# Patient Record
Sex: Male | Born: 1955 | Race: White | Hispanic: No | Marital: Married | Smoking: Never smoker
Health system: Southern US, Community
[De-identification: ages and names within clinical notes are randomized; demographics above are authoritative.]

## PROBLEM LIST (undated history)

## (undated) DIAGNOSIS — Z8673 Personal history of transient ischemic attack (TIA), and cerebral infarction without residual deficits: Secondary | ICD-10-CM

## (undated) DIAGNOSIS — I10 Essential (primary) hypertension: Secondary | ICD-10-CM

## (undated) DIAGNOSIS — E1142 Type 2 diabetes mellitus with diabetic polyneuropathy: Secondary | ICD-10-CM

## (undated) DIAGNOSIS — E1040 Type 1 diabetes mellitus with diabetic neuropathy, unspecified: Secondary | ICD-10-CM

## (undated) DIAGNOSIS — E1140 Type 2 diabetes mellitus with diabetic neuropathy, unspecified: Secondary | ICD-10-CM

## (undated) DIAGNOSIS — R0609 Other forms of dyspnea: Secondary | ICD-10-CM

## (undated) DIAGNOSIS — H532 Diplopia: Secondary | ICD-10-CM

## (undated) DIAGNOSIS — K219 Gastro-esophageal reflux disease without esophagitis: Secondary | ICD-10-CM

## (undated) DIAGNOSIS — F951 Chronic motor or vocal tic disorder: Secondary | ICD-10-CM

## (undated) DIAGNOSIS — E109 Type 1 diabetes mellitus without complications: Secondary | ICD-10-CM

## (undated) DIAGNOSIS — Z8679 Personal history of other diseases of the circulatory system: Secondary | ICD-10-CM

## (undated) DIAGNOSIS — Z860100 Personal history of colon polyps, unspecified: Secondary | ICD-10-CM

## (undated) DIAGNOSIS — G459 Transient cerebral ischemic attack, unspecified: Secondary | ICD-10-CM

## (undated) DIAGNOSIS — J449 Chronic obstructive pulmonary disease, unspecified: Secondary | ICD-10-CM

## (undated) DIAGNOSIS — G629 Polyneuropathy, unspecified: Secondary | ICD-10-CM

## (undated) DIAGNOSIS — M1712 Unilateral primary osteoarthritis, left knee: Secondary | ICD-10-CM

## (undated) DIAGNOSIS — F419 Anxiety disorder, unspecified: Secondary | ICD-10-CM

## (undated) DIAGNOSIS — I639 Cerebral infarction, unspecified: Secondary | ICD-10-CM

## (undated) DIAGNOSIS — Z87442 Personal history of urinary calculi: Secondary | ICD-10-CM

## (undated) DIAGNOSIS — E7800 Pure hypercholesterolemia, unspecified: Secondary | ICD-10-CM

## (undated) DIAGNOSIS — H409 Unspecified glaucoma: Secondary | ICD-10-CM

## (undated) DIAGNOSIS — G319 Degenerative disease of nervous system, unspecified: Secondary | ICD-10-CM

## (undated) DIAGNOSIS — F32A Depression, unspecified: Secondary | ICD-10-CM

## (undated) DIAGNOSIS — G4730 Sleep apnea, unspecified: Secondary | ICD-10-CM

## (undated) DIAGNOSIS — E785 Hyperlipidemia, unspecified: Secondary | ICD-10-CM

## (undated) DIAGNOSIS — F329 Major depressive disorder, single episode, unspecified: Secondary | ICD-10-CM

## (undated) DIAGNOSIS — I5032 Chronic diastolic (congestive) heart failure: Secondary | ICD-10-CM

## (undated) DIAGNOSIS — K200 Eosinophilic esophagitis: Secondary | ICD-10-CM

## (undated) HISTORY — PX: COLONOSCOPY: SHX174

## (undated) HISTORY — PX: CATARACT EXTRACTION, BILATERAL: SHX1313

## (undated) HISTORY — PX: WISDOM TOOTH EXTRACTION: SHX21

## (undated) HISTORY — PX: EYE SURGERY: SHX253

## (undated) HISTORY — PX: CYSTOSCOPY: SUR368

## (undated) HISTORY — PX: JOINT REPLACEMENT: SHX530

## (undated) HISTORY — PX: ESOPHAGOGASTRODUODENOSCOPY: SHX1529

## (undated) MED ORDER — GABAPENTIN 300 MG CAP
300 mg | ORAL_CAPSULE | ORAL | Status: DC
Start: ? — End: 2014-03-21

## (undated) MED ORDER — CLONIDINE 0.1 MG TAB
0.1 mg | ORAL_TABLET | Freq: Two times a day (BID) | ORAL | Status: DC
Start: ? — End: 2012-10-19

## (undated) MED ORDER — CLOPIDOGREL 75 MG TAB
75 mg | ORAL_TABLET | Freq: Every day | ORAL | Status: DC
Start: ? — End: 2013-07-05

## (undated) MED ORDER — BENZONATATE 200 MG CAP
200 mg | ORAL_CAPSULE | Freq: Three times a day (TID) | ORAL | Status: AC | PRN
Start: ? — End: 2013-07-17

## (undated) MED ORDER — INSULIN LISPRO 100 UNIT/ML INJECTION
100 unit/mL | SUBCUTANEOUS | Status: DC
Start: ? — End: 2012-06-23

## (undated) MED ORDER — CLONAZEPAM 0.5 MG TAB
0.5 mg | ORAL_TABLET | Freq: Two times a day (BID) | ORAL | Status: DC
Start: ? — End: 2013-12-04

## (undated) MED ORDER — CLONIDINE 0.1 MG TAB
0.1 mg | ORAL_TABLET | Freq: Two times a day (BID) | ORAL | Status: AC
Start: ? — End: 2013-01-17

## (undated) MED ORDER — OMEPRAZOLE 40 MG CAP, DELAYED RELEASE
40 mg | ORAL_CAPSULE | Freq: Every day | ORAL | Status: DC
Start: ? — End: 2014-01-25

## (undated) MED ORDER — OMEPRAZOLE 40 MG CAP, DELAYED RELEASE
40 mg | ORAL_CAPSULE | ORAL | Status: DC
Start: ? — End: 2013-04-02

## (undated) MED ORDER — OMEPRAZOLE 20 MG CAP, DELAYED RELEASE
20 mg | ORAL_CAPSULE | Freq: Every day | ORAL | Status: DC
Start: ? — End: 2013-01-02

## (undated) MED ORDER — LISINOPRIL 20 MG TAB
20 mg | ORAL_TABLET | Freq: Every day | ORAL | Status: DC
Start: ? — End: 2013-12-21

## (undated) MED ORDER — CLONAZEPAM 0.5 MG TAB
0.5 mg | ORAL_TABLET | Freq: Two times a day (BID) | ORAL | Status: DC
Start: ? — End: 2013-05-18

## (undated) MED ORDER — CLOPIDOGREL 75 MG TAB
75 mg | ORAL_TABLET | Freq: Every day | ORAL | Status: DC
Start: ? — End: 2014-07-18

## (undated) MED ORDER — ALBUTEROL SULFATE HFA 90 MCG/ACTUATION AEROSOL INHALER
90 mcg/actuation | RESPIRATORY_TRACT | Status: DC | PRN
Start: ? — End: 2014-03-29

## (undated) MED ORDER — ATORVASTATIN 20 MG TAB
20 mg | ORAL_TABLET | Freq: Every day | ORAL | Status: DC
Start: ? — End: 2013-09-01

## (undated) MED ORDER — INSULIN LISPRO 100 UNIT/ML INJECTION
100 unit/mL | SUBCUTANEOUS | Status: DC
Start: ? — End: 2013-02-25

## (undated) MED ORDER — AZITHROMYCIN 250 MG TAB
250 mg | ORAL_TABLET | ORAL | Status: AC
Start: ? — End: 2013-07-15

## (undated) MED ORDER — GABAPENTIN 300 MG CAP
300 mg | ORAL_CAPSULE | ORAL | Status: DC
Start: ? — End: 2013-08-27

## (undated) MED ORDER — OMEPRAZOLE 20 MG CAP, DELAYED RELEASE
20 mg | ORAL_CAPSULE | ORAL | Status: DC
Start: ? — End: 2013-07-10

## (undated) MED ORDER — OMEPRAZOLE 40 MG CAP, DELAYED RELEASE
40 mg | ORAL_CAPSULE | ORAL | Status: DC
Start: ? — End: 2013-08-03

## (undated) MED ORDER — ATORVASTATIN 20 MG TAB
20 mg | ORAL_TABLET | ORAL | Status: DC
Start: ? — End: 2014-02-25

## (undated) MED ORDER — CLONAZEPAM 0.5 MG TAB
0.5 mg | ORAL_TABLET | Freq: Two times a day (BID) | ORAL | Status: DC
Start: ? — End: 2013-03-02

## (undated) MED ORDER — HUMALOG U-100 INSULIN 100 UNIT/ML SUBCUTANEOUS SOLUTION: 100 unit/mL | SUBCUTANEOUS | Status: AC

---

## 1996-12-10 DIAGNOSIS — I639 Cerebral infarction, unspecified: Secondary | ICD-10-CM

## 1996-12-10 HISTORY — DX: Cerebral infarction, unspecified: I63.9

## 2004-10-18 HISTORY — PX: CAROTID ARTERY ANGIOPLASTY: SHX1300

## 2004-10-27 NOTE — Procedures (Signed)
[facility]                         PERIPHERAL VASCULAR LABORATORY   NAME:     Maurice [name], Maurice [name]                  DATE:   10/26/2004   AGE/DOB: 49  /  01/17/1956                      ROOM #: OP   SEX:      [name]                                MR #:    [id number]   CPT CODE: [id number]                            SS#     [id number]   REFERRING PHYSICIAN:   THOMAS L MAUSER, [name].D.   CHIEF COMPLAINT/SYMPTOMS:  Right bruit; history of TIA   EXAMINATION:   CEREBROVASCULAR EXAMINATION   INTERPRETATION:   Brachial pressures are equal and biphasic.  Duplex   evaluation of the extracranial carotid vasculature was performed.   On the   right side, there is marked plaque in the proximal internal carotid artery   with velocity elevation up to 333/136, consistent with an 80-99% stenosis.   The vertebral artery is patent with antegrade flow.  On the left side,   there is minimal plaque in the bifurcation with no hemodynamically   significant stenosis noted.  Velocities and spectral analyses are within   normal limits.   The vertebral artery is patent with antegrade flow.   IMPRESSION:      1. Right internal carotid artery 80-99% stenosis.      2. Left internal carotid artery less than 20% stenosis.      3. Patent antegrade vertebral arteries.   Electronically Signed By:   RASESH [name]. SHAH, [name].D. 10/29/2004 16:22   ______________________________________________   RASESH [name]. SHAH, [name].D.   lo  D: 10/27/2004  T: 10/27/2004  4:16 P  [id number]     jcb

## 2004-10-27 NOTE — Procedures (Signed)
[facility]                         PERIPHERAL VASCULAR LABORATORY   NAME:     Maurice [name], Maurice [name]                  DATE:   10/26/2004   AGE/DOB: 49  /  11-02-1955                      ROOM #: OP   SEX:      M                                MR #:    [id number]   CPT CODE: [id number]                            SS#     [id number]   REFERRING PHYSICIAN:   Blenda Bridegroom, M.D.   CHIEF COMPLAINT/SYMPTOMS:  Right bruit; history of TIA   EXAMINATION:   CEREBROVASCULAR EXAMINATION   INTERPRETATION:   Brachial pressures are equal and biphasic.  Duplex   evaluation of the extracranial carotid vasculature was performed.   On the   right side, there is marked plaque in the proximal internal carotid artery   with velocity elevation up to 333/136, consistent with an 80-99% stenosis.   The vertebral artery is patent with antegrade flow.  On the left side,   there is minimal plaque in the bifurcation with no hemodynamically   significant stenosis noted.  Velocities and spectral analyses are within   normal limits.   The vertebral artery is patent with antegrade flow.   IMPRESSION:      1. Right internal carotid artery 80-99% stenosis.      2. Left internal carotid artery less than 20% stenosis.      3. Patent antegrade vertebral arteries.   Electronically Signed By:   Michael Litter, M.D. 10/29/2004 16:22   ______________________________________________   Michael Litter, M.D.   lo  D: 10/27/2004  T: 10/27/2004  4:16 P  [id number]     jcb

## 2004-11-10 NOTE — H&P (Signed)
[facility]                              HISTORY AND PHYSICAL                              RASESH Rosita Fire, M.D.   NAME:    Maurice [name], Maurice [name]   MR #:    [id number]                    ADM DATE:        11/18/2004   BILLING  [id number]                   PT. LOCATION   #:   SS #     [id number]   Michael Litter, M.D.   cc:    Blenda Bridegroom, M.D.          Michael Litter, M.D.   COPY TO:   [facility] Transplant   HISTORY OF PRESENT ILLNESS:   This is an 49year-old whiteman found to have   a significant right carotid stenosis on a workup of an asymptomatic bruit.   He is brought to the Angio suite for diagnostic angiography in preparation   for probable right carotid endarterectomy.   PAST MEDICAL HISTORY:    Significant for diabetes, hypertension, and   gastroesophageal reflux disease.   PAST SURGICAL HISTORY:   None.   SOCIAL HISTORY:   He is a nonsmoker, social drinker.   ALLERGIES:    No known allergies.   MEDICATIONS:   Aspirin, Plavix, lisinopril, insulin and omeprazole.   REVIEW OF SYSTEMS:  Is negative for stroke, transient ischemic attack or   amaurosis, negative for coronary symptoms, negative for peripheral vascular   symptoms.   FAMILY HISTORY:  Positive for diabetes.   PHYSICAL EXAMINATION:   He is well-developed and in no acute distress.   HEENT:   Examination unremarkable.   NECK:   Supple with a right carotid bruit.   CHEST:   Clear.   HEART:  Regular.   ABDOMEN:    Soft, nontender with no aneurysm.   EXTREMITIES:  Lower extremity arteriogram normal.   NEUROLOGIC:  Intact.   Duplex scan reveals a right carotid stenosis, grade 3.   IMPRESSION:   Asymptomatic high grade right carotid stenosis.   PLAN:   Arch and carotid arteriography in anticipation of probable right   carotid endarterectomy.   Electronically Signed By:   Michael Litter, M.D. 11/13/2004 17:24   ____________________________   Michael Litter, M.D.   eb  D:  11/10/2004  T:  11/10/2004 10:16 P   [id number]

## 2004-11-10 NOTE — H&P (Signed)
[facility]                              HISTORY AND PHYSICAL                              RASESH Rosita Fire, M.D.   NAME:    Maurice [name], Maurice [name]   MR #:    [id number]                    ADM DATE:        11/11/2004   BILLING                              PT. LOCATION   #:   SS #     [id number]   Michael Litter, M.D.   cc:    Blenda Bridegroom, M.D.          Michael Litter, M.D.   COPY TO:   Vascular and Transplant   HISTORY OF PRESENT ILLNESS:   This is an 49 year-old white man found to   have a severe right carotid stenosis on workup of an asymptomatic bruit.   He is brought to the operating room for a right carotid endarterectomy.   PAST MEDICAL HISTORY:    Significant for diabetes, hypertension and   gastroesophageal reflux.   PAST SURGICAL HISTORY:   None.   SOCIAL HISTORY:   He is a nonsmoker and social drinker.   ALLERGIES:    No allergies.   MEDICATIONS:   Are noted.   REVIEW OF SYSTEMS:  Is negative for cerebrovascular, cardiovascular or   peripheral vascular events.   FAMILY HISTORY:   Is for diabetes.   PHYSICAL EXAMINATION:   GENERAL:  He is well-developed and in no acute distress.   HEENT:   Examination unremarkable.   NECK:   Supple with a right carotid bruit.   CHEST:   Clear.   HEART:  Regular.   ABDOMEN:   Soft, nontender, no aneurysm.   EXTREMITIES:   Lower extremity arterial examination normal.   NEUROLOGIC:  Intact.   Duplex scan has confirmed right carotid stenosis.  Angiogram report is   pending at the time of this dictation.   IMPRESSION:   High grade asymptomatic right carotid stenosis.   PLAN:   Right carotid endarterectomy.   Electronically Signed By:   Michael Litter, M.D. 11/13/2004 17:24   ____________________________   Michael Litter, M.D.   eb  D:  11/10/2004  T:  11/10/2004 10:25 P   [id number]

## 2004-11-11 NOTE — Procedures (Signed)
[facility]                      NUCLEAR CARDIOLOGY STRESS TEST REPORT   NAME:   Maurice [name], Maurice [name]                              SS#:      [id number]   DOB:     25-Mar-1956                                     AGE:      49   SEX:     M                                           ROOM#:   OP   MR#:    [id number]                                       DATE:    11/11/2004   REFERRING PHYS:   Vira Browns   PRETEST DATA:   ISOTOPE:  Thallium 201; Tc44m Sestamibi   DIPYRIDAMOLE DOSAGE:  46.4 mg (9 cc)   INDICATION:  Pre-op   MEDS TAKEN:  --   MEDS HELD:  Lisinopril, Plavix, Aspirin, Clonazepam, Insulin   RISK FACTORS: Hypertension; diabetes   BASELINE ECG:  Normal sinus rhythm; nonspecific ST-T wave changes; poor R   wave progression   EXERCISE SUPERVISED BY:  Donnie Mesa, ACNP   TEST RESULTS:                         DIPYRIDAMOLE OBSERVATION PERIOD   TIME:   REST   1 MINUTE  2 MINUTES 3 MINUTES  4 MINUTES  EXERCISE   RECOVERY   HR:      71       71        88         78         90        --         96   BP:    117/71    99/72    117/75     116/65     100/71    ---/--     110/79   REASON FOR STOPPING:  Completed protocol                   TOTAL EXERCISE   TIME:  0:00   HR RESPONSE:  Normal                                       ACHIEVED HR:   90   BP RESPONSE:  Normal                                       CHEST PAIN:   None   OBSERVED DYSRHYTHMIAS:  None   ST SEGMENT CHANGES:  Further increase in rest changes                                STRESS ECG REPORT   CONCLUSION:  Non-diagnostic ECG response due to rest ST segment changes;   Unremarkable clinical response to intravenous Pharmacologic stress.                                 NUCLEAR REPORT   FINDINGS:  (Read with Dr. Van Clines.)  Tomographic nuclear imaging   demonstrates no significant fixed or transient defects, comparing the   infusion stress study and the rest images.  Gated stress study demonstrates   0.62 ejection fraction.    OVERALL IMPRESSION:  Normal ECG gated intravenous pharmacologic SPECT dual   isotope nuclear stress test demonstrates normal left ventricular function   with no evidence of myocardial ischemia during pharmacologic stress.   ECG STRESS INTERPRETATION BY:            NUCLEAR INTERPRETATION BY:   Harland German., M.D.              Harland German., M.D.   ds  D: 11/11/2004  T: 11/12/2004  2:05 P    [id number]

## 2004-11-11 NOTE — Procedures (Signed)
[facility]                      NUCLEAR CARDIOLOGY STRESS TEST REPORT   NAME:   [name], Maurice M                              SS#:      [id number]   DOB:     11/25/1955                                     AGE:      49   SEX:     M                                           ROOM#:   OP   MR#:    [id number]                                       DATE:    11/11/2004   REFERRING PHYS:   R. Shah   PRETEST DATA:   ISOTOPE:  Thallium 201; Tc99m Sestamibi   DIPYRIDAMOLE DOSAGE:  46.4 mg (9 cc)   INDICATION:  Pre-op   MEDS TAKEN:  --   MEDS HELD:  Lisinopril, Plavix, Aspirin, Clonazepam, Insulin   RISK FACTORS: Hypertension; diabetes   BASELINE ECG:  Normal sinus rhythm; nonspecific ST-T wave changes; poor R   wave progression   EXERCISE SUPERVISED BY:  Holly Buchanan, ACNP   TEST RESULTS:                         DIPYRIDAMOLE OBSERVATION PERIOD   TIME:   REST   1 MINUTE  2 MINUTES 3 MINUTES  4 MINUTES  EXERCISE   RECOVERY   HR:      71       71        88         78         90        --         96   BP:    117/71    99/72    117/75     116/65     100/71    ---/--     110/79   REASON FOR STOPPING:  Completed protocol                   TOTAL EXERCISE   TIME:  0:00   HR RESPONSE:  Normal                                       ACHIEVED HR:   90   BP RESPONSE:  Normal                                       CHEST PAIN:   None   OBSERVED DYSRHYTHMIAS:  None   ST SEGMENT CHANGES:    Further increase in rest changes                                STRESS ECG REPORT   CONCLUSION:  Non-diagnostic ECG response due to rest ST segment changes;   Unremarkable clinical response to intravenous Pharmacologic stress.                                 NUCLEAR REPORT   FINDINGS:  (Read with Dr. Daughdrille.)  Tomographic nuclear imaging   demonstrates no significant fixed or transient defects, comparing the   infusion stress study and the rest images.  Gated stress study demonstrates   0.62 ejection fraction.    OVERALL IMPRESSION:  Normal ECG gated intravenous pharmacologic SPECT dual   isotope nuclear stress test demonstrates normal left ventricular function   with no evidence of myocardial ischemia during pharmacologic stress.   ECG STRESS INTERPRETATION BY:            NUCLEAR INTERPRETATION BY:   CHARLES C. ASHBY, JR., M.D.              CHARLES C. ASHBY, JR., M.D.   ds  D: 11/11/2004  T: 11/12/2004  2:05 P    [id number]

## 2004-11-16 NOTE — Procedures (Signed)
[facility]                                 PROCEDURE NOTE                              Maurice [name], M.D.   [facility] [name], [name]:   MR  [id number]                         DATE:            11/16/2004   #:   [name]  [id number]                      PT. LOCATION:    ERO EO12   #   Maurice [name], M.D.   cc:    Blenda Bridegroom, M.D.          Michael Litter, M.D.   Extra copies to office:  0   PREOPERATIVE DIAGNOSIS:   Right carotid stenosis.   POSTOPERATIVE DIAGNOSIS:   Same.   PROCEDURE:   1.  Arch aortogram.   2.  Selective bilateral carotid arteriograms.   SURGEON:   Dr. Sherryll Burger   ANESTHESIA:   Local   COMPLICATIONS:   None   INDICATIONS:   This is a 49 year old white man found to have a high-grade right carotid   stenosis on workup of an asymptomatic bruit. He is brought to the angio   suite for diagnostic angiography in anticipation of right carotid   endarterectomy.   DESCRIPTION OF PROCEDURE:  The patient was brought to the angio suite,   placed supine and the right groin prepped and draped in the usual sterile   fashion.  Xylocaine 1% was used as local anesthetic and conscious sedation   was administered with intravenous fentanyl and versed.  The appropriate   monitors were in place.  A  micropuncture needle used to access the right   common femoral artery.  This was upsized to a 4-French sheath.  Bentson   wire was used to advance the pigtail catheter into the ascending aorta.  A   30-degree LAO arch aortogram was performed.  The pigtail was then exchanged   for a Bernstein catheter which was used in conjunction with a soft angle   guidewire in order to select a left common carotid artery. AP and lateral   cervical and intracranial carotid arteriograms were performed.  The   catheter was then remanipulated into the right common carotid artery and   again, AP and lateral cervical and intracranial carotid arteriograms were   performed.  Once adequate pictures were  obtained, the catheter was removed   over wire, sheath was pulled and hemostasis achieved with digital pressure.   A dry sterile dressing was placed and the patient transported to recovery   in stable condition; there were no complications.   INTERPRETATION OF FILMS:   Arch aortogram revealed normal arch anatomy with separate take-offs of the   innominate, left carotid and left subclavian arteries. Both subclavian and   vertebral arteries are widely patent.  The right common carotid artery is   widely patent up to the bifurcation.  There is a 95% stenosis at the origin   of the internal carotid artery with a normal distal  internal carotid   artery.  The external carotid artery is widely patent.  The internal   carotid artery fills a middle cerebral territory.  The anterior cerebral   territory is filled from the contralateral side.  Left side common carotid   artery is widely patent to the bifurcation.  There is no significant   disease in the internal or external carotid arteries. The internal carotid   artery intracranially is normal and fills both the left middle cerebral and   the left and right anterior cerebral territories.   Electronically Signed By:   Michael Litter, M.D. 11/18/2004 16:29   _________________________________   Michael Litter, M.D.   jdm  D:  11/16/2004  T:  11/16/2004  4:31 P   [id number]

## 2004-11-16 NOTE — Procedures (Signed)
[facility]                                 PROCEDURE NOTE                              RASESH Rosita Fire, M.D.   [facility] [name], [address] [place]:   MR  [id number]                         DATE:            11/16/2004   #:   [name]  [id number]                      PT. LOCATION:    ERO EO12   #   RASESH Rosita Fire, M.D.   cc:    Blenda Bridegroom, M.D.          Michael Litter, M.D.   Extra copies to office:  0   PREOPERATIVE DIAGNOSIS:   Right carotid stenosis.   POSTOPERATIVE DIAGNOSIS:   Same.   PROCEDURE:   1.  Arch aortogram.   2.  Selective bilateral carotid arteriograms.   SURGEON:   Dr. Sherryll Burger   ANESTHESIA:   Local   COMPLICATIONS:   None   INDICATIONS:   This is a 49 year old white man found to have a high-grade right carotid   stenosis on workup of an asymptomatic bruit. He is brought to the angio   suite for diagnostic angiography in anticipation of right carotid   endarterectomy.   DESCRIPTION OF PROCEDURE:  The patient was brought to the angio suite,   placed supine and the right groin prepped and draped in the usual sterile   fashion.  Xylocaine 1% was used as local anesthetic and conscious sedation   was administered with intravenous fentanyl and versed.  The appropriate   monitors were in place.  A  micropuncture needle used to access the right   common femoral artery.  This was upsized to a 4-French sheath.  Bentson   wire was used to advance the pigtail catheter into the ascending aorta.  A   30-degree LAO arch aortogram was performed.  The pigtail was then exchanged   for a Bernstein catheter which was used in conjunction with a soft angle   guidewire in order to select a left common carotid artery. AP and lateral   cervical and intracranial carotid arteriograms were performed.  The   catheter was then remanipulated into the right common carotid artery and   again, AP and lateral cervical and intracranial carotid arteriograms were    performed.  Once adequate pictures were obtained, the catheter was removed   over wire, sheath was pulled and hemostasis achieved with digital pressure.   A dry sterile dressing was placed and the patient transported to recovery   in stable condition; there were no complications.   INTERPRETATION OF FILMS:   Arch aortogram revealed normal arch anatomy with separate take-offs of the   innominate, left carotid and left subclavian arteries. Both subclavian and   vertebral arteries are widely patent.  The right common carotid artery is   widely patent up to the bifurcation.  There is a 95% stenosis at the origin   of the internal carotid artery with a normal distal  internal carotid   artery.  The external carotid artery is widely patent.  The internal   carotid artery fills a middle cerebral territory.  The anterior cerebral   territory is filled from the contralateral side.  Left side common carotid   artery is widely patent to the bifurcation.  There is no significant   disease in the internal or external carotid arteries. The internal carotid   artery intracranially is normal and fills both the left middle cerebral and   the left and right anterior cerebral territories.   Electronically Signed By:   Michael Litter, M.D. 11/18/2004 16:29   _________________________________   Michael Litter, M.D.   jdm  D:  11/16/2004  T:  11/16/2004  4:31 P   [id number]

## 2004-11-18 NOTE — Procedures (Signed)
Maurice [name] Maurice [name]                                 PROCEDURE NOTE                              RASESH Maurice [name], M.D.   [facility] Maurice [name], [place]   E:   MR  [id number]                         DATE:            11/18/2004   #:   Maurice [name]  [id number]                      PT. LOCATION:    [id number]   #   RASESH Maurice [name], M.D.   cc:    Michael Litter, M.D.   Extra copies to office:  0   PREOPERATIVE DIAGNOSIS:   High-grade asymptomatic right carotid stenosis.   POSTOPERATIVE DIAGNOSIS:   High-grade asymptomatic right carotid stenosis.   PROCEDURE:   Right carotid endarterectomy.   SURGEON:   Chalmers Guest. Sherryll Burger, M.D.   ASSISTANTDarl Pikes, PA-C (no residents available).   ANESTHESIA:   Cervical block.   COMPLICATIONS:   None.   INDICATIONS:   This is a 49 year old white male found to have a high-grade right carotid   stenosis which is asymptomatic and workup of a bruit. This was confirmed by   angiography, and he is now brought to the operating room for right carotid   endarterectomy.   DESCRIPTION OF PROCEDURE:  The patient was brought to the operating room   and after a right cervical block was in place, he was placed supine and the   right neck prepped and draped in the usual sterile fashion.  Oblique   incision made in a skin line overlying the carotid bifurcation which was   then dissected free. The patient was given 4000 units of IV heparin and   low-molecular dextran began at 10 cc per hour IV.  Once adequate exposure   was obtained, circulating time was allowed for the heparin, the vessels   were clamped.  The patient remained neurologically intact.  Therefore,   standard endarterectomy was carried out using the Therapist, nutritional with no   shunt.  This was a very localized lesion just in the bulb of the internal   carotid artery, which cleaned out nicely.  Therefore, the arteriotomy was   simply closed with a 6-0 Prolene suture.  Before the last of the suture   bites were placed, the vessels were  first flushed with dextran and back   bled then flushed with heparin and dextran again.  The last 2 suture bites   were then placed and the suture tied, clamps released, hemostasis was   adequate.  There is a good pulse and a normal Doppler signal distally.   Therefore the wound was then closed with 3-0 Vicryl and 4-0 Monocryl.   Steri-Strips and dressings were placed and the patient transported to   recovery in hemodynamically and neurologically stable condition.  There   were no complications.   Electronically Signed By:   Michael Litter, M.D. 11/18/2004 16:30   _________________________________   Michael Litter, M.D.   Dwaine Deter  D:  11/18/2004  T:  11/18/2004  4:15 P   [id number]

## 2004-11-18 NOTE — Procedures (Signed)
[facility]                                 PROCEDURE NOTE                              RASESH Rosita Fire, M.D.   [facility] [name], [place]   E:   MR  [id number]                         DATE:            11/18/2004   #:   [name]  [id number]                      PT. LOCATION:    [id number]   #   RASESH Rosita Fire, M.D.   cc:    Michael Litter, M.D.   Extra copies to office:  0   PREOPERATIVE DIAGNOSIS:   High-grade asymptomatic right carotid stenosis.   POSTOPERATIVE DIAGNOSIS:   High-grade asymptomatic right carotid stenosis.   PROCEDURE:   Right carotid endarterectomy.   SURGEON:   Chalmers Guest. Sherryll Burger, M.D.   ASSISTANTDarl Pikes, PA-C (no residents available).   ANESTHESIA:   Cervical block.   COMPLICATIONS:   None.   INDICATIONS:   This is a 49 year old white male found to have a high-grade right carotid   stenosis which is asymptomatic and workup of a bruit. This was confirmed by   angiography, and he is now brought to the operating room for right carotid   endarterectomy.   DESCRIPTION OF PROCEDURE:  The patient was brought to the operating room   and after a right cervical block was in place, he was placed supine and the   right neck prepped and draped in the usual sterile fashion.  Oblique   incision made in a skin line overlying the carotid bifurcation which was   then dissected free. The patient was given 4000 units of IV heparin and   low-molecular dextran began at 10 cc per hour IV.  Once adequate exposure   was obtained, circulating time was allowed for the heparin, the vessels   were clamped.  The patient remained neurologically intact.  Therefore,   standard endarterectomy was carried out using the Therapist, nutritional with no   shunt.  This was a very localized lesion just in the bulb of the internal   carotid artery, which cleaned out nicely.  Therefore, the arteriotomy was   simply closed with a 6-0 Prolene suture.  Before the last of the suture    bites were placed, the vessels were first flushed with dextran and back   bled then flushed with heparin and dextran again.  The last 2 suture bites   were then placed and the suture tied, clamps released, hemostasis was   adequate.  There is a good pulse and a normal Doppler signal distally.   Therefore the wound was then closed with 3-0 Vicryl and 4-0 Monocryl.   Steri-Strips and dressings were placed and the patient transported to   recovery in hemodynamically and neurologically stable condition.  There   were no complications.   Electronically Signed By:   Michael Litter, M.D. 11/18/2004 16:30   _________________________________   Michael Litter, M.D.   Dwaine Deter  D:  11/18/2004  T:  11/18/2004  4:15 P   [id number]

## 2004-11-19 NOTE — Discharge Summary (Signed)
[facility]                                DISCHARGE SUMMARY   NAM AKAASH, VANDEWATER:   MR  [id number]                          ADM DATE:      11/18/2004   #:   [name]  [id number]                       DIS DATE:      11/19/2004   #   Michael Litter, M.D.   cc:    Michael Litter, M.D.   PREOPERATIVE DIAGNOSIS:  Right carotid artery stenosis.   POSTOPERATIVE DIAGNOSIS:  Right carotid artery stenosis.   OPERATION:  Right carotid endarterectomy with primary closure.   BRIEF HISTORY &amp; PHYSICAL:  Mr. Ganas is a 49 year old Caucasian male who   was found to have a high-grade right carotid artery stenosis.  Following   angiography surgery was recommended.  The patient had been on aspirin and   Plavix at home.  He was a nonsmoker.   FAMILY HISTORY:   Positive for diabetes.   REVIEW OF SYSTEMS:  Negative.   PHYSICAL EXAMINATION:  A right carotid bruit.   HOSPITAL COURSE:  The patient was brought via same day admit, prepared with   preoperative Ancef.  He had a cervical block performed and he was taken to   the operating room where he underwent the previously mentioned surgery   without complications.  During the operative procedure and during the   immediate postoperative observation period in the ICU he remained   hemodynamically stable.  There was no neurologic sequelae.  On the day of   discharge the patient was feeling well with mild incisional pain.  His   neurologic status was intact, his vital signs were stable.  His lab tests   were unremarkable.  After breakfast and ambulation in the unit, the patient   was discharged home.  He was advised to continue on his preoperative   medications until seen by Dr. Sherryll Burger, who is going to see him in 2 weeks in   the office.  He was advised to have no heavy exertional   activity for a week.  The patient understood these orders and was   discharged in stable condition with follow up planned as dictated.   Electronically Signed By:    Lillia Dallas. PANNETON 11/26/2004 08:36   ________________________________   Michael Litter, M.D.   Dictated by: Mellody Dance, PA-C   mh  D:  11/19/2004  T:  11/19/2004  7:51 A   [id number]

## 2006-09-13 NOTE — ED Provider Notes (Signed)
[facility]                      EMERGENCY DEPARTMENT TREATMENT REPORT   NAME:  Maurice [name], Maurice [name]                        PT. LOCATION:     ER  [id number]   MR #:         BILLING #: [id number]          DOS: 09/13/2006   TIME:11:22 P   57-16-48   cc:    Blenda Bridegroom, M.D.   Primary Physician:   Caralyn Guile, M.D.   TIME SEEN:   2243.   CHIEF COMPLAINT:  Fall.   HISTORY OF PRESENT ILLNESS:  A 51 year old male presents with his spouse by   EMS after a fall in the shower this evening.  He states that he slipped on   some soapy water and fell striking his iliac crest on a built-in shower   bench.  He denies any head injury or loss of consciousness.  He denies any   pain anywhere else.   REVIEW OF SYSTEMS   EYES:  No visual changes.   ENT:  No epistaxis or loose teeth.   RESPIRATORY:   No trouble breathing.   CARDIOVASCULAR:  No rib pain.   GASTROINTESTINAL:   No vomiting or abdominal pain.   MUSCULOSKELETAL:   Right posterior back pain and iliac crest pain, no   midline pain.   NEUROLOGICAL:  No head injury or loss of consciousness.   SKIN:  No abrasions or lacerations.   PAST MEDICAL HISTORY:  Includes diabetes, hypertension, carotid artery   surgery, anxiety.   SOCIAL HISTORY: Here with spouse.   FAMILY HISTORY:  Noncontributory.   ALLERGIES:  No known drug allergies.   MEDICATIONS:  Reviewed by myself in Ibex.   PHYSICAL EXAMINATION:   VITAL SIGNS:  Blood pressure 139/89, pulse 81, respirations 16, temperature   97.8, oxygen saturation 100% on room air, pain 7/10.   GENERAL APPEARANCE:  The patient appears well developed and well nourished.   Appearance and behavior are age and situation appropriate.   RESPIRATORY:  Clear and equal breath sounds.  No respiratory distress,   tachypnea, or accessory muscle use.   CARDIOVASCULAR:  Regular rate and rhythm.   GI:  Abdomen soft, nontender, without complaint of pain to palpation.  No   hepatomegaly or splenomegaly.    MUSCULOSKELETAL:   Right hip - nontender over the anterior, posterior and   lateral aspect, nontender over the femur, knee, tibia, fibula and foot with   dorsalis pedis pulse intact.  He is tender over the right iliac crest   posteriorly.   SKIN:  Warm and dry without rashes.   PSYCHIATRIC:   Recent and remote memory appear to be intact.   NEUROLOGICAL:  No focal deficits.   CONTINUATION BY Salem Caster, PA-C:   INITIAL ASSESSMENT AND MANAGEMENT PLAN:  [age]-year-old male presents after   a fall with back pain.  We are going to check a pelvis to rule out   fracture.  We will also give him Dilaudid and Zofran IV.   DIAGNOSTIC STUDIES:  Pelvis and right hip lateral read by Dr. Raynald Kemp as   negative.   DIAGNOSES:      1. Acute right paralumbar back pain.      2. Fall.   PLAN:  1. The patient is discharged home in stable condition, with instructions      to follow up with their regular doctor.  They are advised to return      immediately for any worsening or symptoms of concern.      2. Prescription written for Percocet #16 and Robaxin #40.  May also take      over-the-counter Motrin.  Written a work note for 2 days.   Electronically Signed By:   Jerilynn Som, M.D. 09/15/2006 05:37   ____________________________   Jerilynn Som, M.D.   My signature above authenticates this document and my orders, the final   diagnosis(es), discharge prescription(s) and instructions in the [facility] record.   rew/gm   D:  09/13/2006  T:  09/13/2006 11:48 P   [id number]   Salem Caster, PA-C

## 2008-09-05 NOTE — ED Provider Notes (Signed)
[facility]                      EMERGENCY DEPARTMENT TREATMENT REPORT   NAME:  Maurice [name], Maurice [name]                      PT. LOCATION:     ER  ER01   MR #:         BILLING #: [id number]          DOS: 09/04/2008   TIME: 1:44 A   57-16-48   cc:    Blenda Bridegroom, M.D.   PRIMARY CARE PHYSICIAN   Caralyn Guile, M.D.   ER EVALUATION TIME ON 09/04/08   23:15.   CHIEF COMPLAINT   Sent from [facility].   HISTORY OF THE PRESENT ILLNESS   A 53 year old male comes in for evaluation   of upper abdominal pain that started around 4:30 this afternoon. He said   that it is mid epigastric, nonradiating, sharp in nature, nothing like he   has ever experienced before.  He ate something because his blood sugar was   low.  The pain did not increase; however, his blood sugar remained in the   50s and 60s. He went to [facility], disconnected his insulin pump, and   it came up to about 110.  He has had several episodes of vomiting.  No   fever.  Denies any other complaint or concern.   REVIEW OF SYSTEMS   CONSTITUTIONAL:  Has not had a fever or chill.   EYES:  No visual symptoms.   ENT:  No sore throat, runny nose or other URI symptoms.   RESPIRATORY:  No cough, shortness of breath, or wheezing.   CARDIOVASCULAR:  No chest pain, chest pressure, or palpitations.   GASTROINTESTINAL:  See HPI.   MUSCULOSKELETAL:  No joint pain or swelling.   NEUROLOGICAL:  No headaches, sensory or motor symptoms.   PAST MEDICAL HISTORY   He is a diabetic.  He has also had high cholesterol.   SOCIAL HISTORY   Here with his wife.   MEDICATIONS   Listed and reviewed in [place]ex.   ALLERGIES   None.   PHYSICAL EXAMINATION   GENERAL APPEARANCE:  The patient appears well developed and well nourished.   Appearance and behavior are age and situation appropriate.   VITAL SIGNS:  Blood pressure 161/106, pulse 113, respirations 22,   temperature 98.4, 96% on room air, 9/10 pain.    Eyes:  Conjunctivae clear, lids normal.  Pupils equal, symmetrical, and   normally reactive.   Mouth/Throat:  Surfaces of the pharynx, palate, and tongue are pink, moist,   and without lesions.   RESPIRATORY:  Clear and equal breath sounds.  No respiratory distress,   tachypnea, or accessory muscle use.   CARDIOVASCULAR:  Heart regular, without murmurs, gallops, rubs, or thrills.   PMI not displaced.   GI:  Abdomen is distended, tender in the epigastrium.  No rebound,   guarding.  No splenomegaly or hepatomegaly.  Bowel sounds are present.   NEURO:  Awake, alert, oriented without deficit focally.   CONTINUATION BY ERIN ICENBICE, PA-C   INITIAL ASSESSMENT   A patient with mid epigastric pain, sent to [facility].  At this time,   he has had an episode of vomiting, feeling distended.  We are going to   obtain basic blood work including a lipase and CMP.  Will  look at the   patient's abdominal films and reassess.   ED COURSE   The patient received 8 of Zofran and 1 mg of Dilaudid. White count  was   17.3 with 83 segs,  hemoglobin and hematocrit 17.3 and 50.  CMP showed a   glucose of 152, otherwise unremarkable.  The patient's lipase is 65.  Plain   films of the abdominal series reviewed from [facility]  showed   questionable ileus.  With the patient's tenderness and these findings on   x-ray, a CAT scan of the abdomen and pelvis was ordered, read by [facility]   [facility] as no acute process by Dr. Risa Grill.  Dr. Jama Flavors spoke   with  Dr. Emmit Alexanders, as on reassessment, the patient was still having some   discomfort, tenderness in the mid epigastrium with his elevating white   count which was only 14 at [facility].  He will be placed in the   hospital for observation for Dr. Emmit Alexanders to see in the morning and further   assess.   CLINICAL IMPRESSION AND DIAGNOSES   1. Abdominal pain, etiology unclear.   2. Leukocytosis.   DISPOSITION Admission to Dr. Emmit Alexanders.   Electronically Signed By:    Imogene Burn, M.D. 09/06/2008 13:06   ____________________________   Imogene Burn, M.D.   Dictated by Oswaldo Conroy ICENBICE   My signature above authenticates this document and my orders, the final   diagnosis(es), discharge prescription(s) and instructions in the [facility] record.   PB  D:  09/05/2008  T:  09/05/2008  3:09 A   [id number]

## 2008-09-05 NOTE — H&P (Signed)
[facility]                              HISTORY AND PHYSICAL                              Blenda Bridegroom, M.D.   NAMETYSHUN, TUCKERMAN   MR #:    [id number]                  ADM DATE:          09/04/2008   BILLING  [id number]                 PT. LOCATION       ER  [id number]   #:   SS #     [id number]               DOB:  07-17-1956   AGE:  53   Blenda Bridegroom, M.D.              SEX:  M   cc:    Larry Sierras, M.D.          Blenda Bridegroom, M.D.   ADMISSION DIAGNOSIS   Abdominal pain.   HISTORY OF PRESENT ILLNESS   The patient is very pleasant, 53 year old gentleman with a greater than   25-year history of insulin-requiring diabetes who was in his usual state of   health until about 4:30 yesterday afternoon.  The patient noted that he   felt like he had a low blood sugar and, in fact, it was about 57 on his   reading late afternoon.  He took a couple of cookies to bring his sugars   up.  Before having dinner and the thereafter, he developed a crampy   abdominal pain primarily in the epigastrium, but also in the right upper   quadrant.  This had a constant nature to it, but also a crampy nature.  He   was not overly nauseated, but subsequently vomited multiple times.  The   patient, prior to vomiting, felt lightheaded and became diaphoretic.   The patient initially went to [facility], but subsequently was referred   over to the emergency room.  He started vomiting at [facility] and   vomited several times there and one time here in the emergency room.  The   pain remained fairly constant in location.   The patient denies any blood in the vomitus.  He has not had any recent   illnesses including any diarrheal illnesses.  He has not noticed any   constipation or blood in his stool.  He is free of urinary symptoms.   In the ER the patient had initially a normal temperature of 98.4, but at   this morning his temperature is up to 100.  His initial white blood cell    count is elevated at 17,300 with 83% segs.  A repeat blood count early this   morning has normalized back to 8400.  Lipase level is normal at 65.  The   patient's liver enzymes are within normal limits.  His BUN and creatinine   are 17 and 1.1 respectively.  Electrolytes are within normal limits.  The   patient had a CT scan of his abdomen and pelvis performed here in the ER   which showed a small  amount of free fluid in the right lower quadrant   surrounding several small bowel loops.  A low density was also seen at the   ampule of the pancreas.  The patient also had fat-containing inguinal   hernias.  The remainder of the CT scan was normal.  The patient received   pain medicine, IV Dilaudid and IV Zofran, and currently rates his pain as a   3/10.  Yesterday evening he rated it as a 9/10.  The patient is going to be   admitted to a general medical floor for further evaluation of his abdominal   pain.  The differential diagnoses are fairly broad with gallbladder disease   being fairly high on the list of possibilities.  Additional possibilities   include gastroparesis given his diabetes, peptic ulcer disease and possible   gastric outlet stricture.  The pancreatic abnormality needs further   evaluation.  Tentative plan is to obtain an ultrasound of the right upper   quadrant and further evaluation will be pursued based on the results of   that study.   PAST MEDICAL HISTORY   1. Insulin-requiring diabetes for greater than 25 years.  Dr. Ovidio Hanger is      his endocrinologist.  The patient manages his diabetes with an insulin      pump.  He has complications of retinopathy in (Dr. Eustace Quail),      peripheral neuropathy which is mild and microalbuminuria.   2. Hypertension.   3. Hyperlipidemia   4. No known history of coronary artery disease.  The patient had a nuclear      stress test in March 2006 which was negative for reversible ischemia or      evidence of scar.    5. History of adult-onset measles in the past which required ICU admission.   6. The patient was admitted once with pneumonia which also required an ICU      admission.   7. History of a TIA with transient left hemiparesis.  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   8. Peripheral vascular disease status post right carotid endarterectomy.   9. Colonoscopy from February 2009 with removal of a 3-mm left-sided polyp,      otherwise normal exam.   10. History of Peyronie's disease.   CURRENT MEDICATIONS   1. Lisinopril 10 mg daily.   2. Omeprazole 20 mg daily.   3. Klonopin 0.5 mg 1 p.o. b.i.d.   4. Insulin pump.   5. Enteric-coated aspirin 165 mg p.o. daily.   6. Alpha lipoic acid daily.   7. Vitamin E daily.   8. Vitamin D daily.   9. Clonidine 0.1 mg b.i.d.   10. Timolol ophthalmic drops 0.25%, 1 drop both eyes daily.   11. Lipitor 10 mg daily.   ALLERGIES   The patient has no known drug allergies.  He is intolerant of Zocor,   Cymbalta and avoids inhaled corticosteroids.   SOCIAL HISTORY   The patient is married.  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  He does not smoke.  He   occasionally has a beer, though this is rare.  He does not exercise   regularly.   FAMILY HISTORY   Father also had diabetes and was diagnosed with prostate cancer in his 28s.   He has a brother who died as a result of a motor-vehicle accident, but also  had diabetes.  He has one son and one daughter both of whom are healthy.   One uncle had Parkinson's disease.   REVIEW OF SYSTEMS   Please see above.  The patient denies any chest pain or pressure,   palpitations, shortness of breath, cough, wheezing or recent respiratory   illness.  The patient does complain of some mild throat discomfort after   vomiting.  He currently has a mild headache.  He denies any recent change   in his vision.  He chronically has some mild numbness in his toes and has a    nervous tic in forceful contractions of some of his toes, fairly well   controlled with low-dose Klonopin.  He denies any dysuria, frequency,   urgency or hematuria.  He denies any hesitancy of urination.   PHYSICAL EXAMINATION   The patient is a very pleasant, 53 year old gentleman.  He is awake, alert   and oriented x3, not in any acute distress.  Temperature again was 100   degrees.   HEENT:  No oral lesions were noted.   NECK:  There is no cervical adenopathy.  No carotid bruits were   auscultated.  No thyromegaly is palpable.   HEART:  Regular rate of 85 without murmur, rub or gallop.   LUNGS:  Clear to auscultation in all fields without crackles, wheezes,   rhonchi or dullness.   ABDOMEN:  Soft with mild epigastric tenderness without guarding or rebound   currently.  There is no tenderness in the right upper quadrant currently to   deep palpation, though the patient states that last night there would have   been.  No hepatomegaly or splenomegaly is palpable.  No other masses were   palpable.  No bruits were auscultated.   RECTAL:  Prostate is 2+ without nodules and is of normal consistency.   Hemoccult stool is negative.   EXTREMITIES:  There is no peripheral edema or cyanosis present.  PT pulses   are +1 bilaterally.   NEUROLOGIC:  Motor strength is +5.  There is no facial weakness.  Speech is   fluent.   ASSESSMENT AND PLAN   Abdominal pain of uncertain etiology with fairly broad differential   diagnoses.  Will, as mentioned above, start with an ultrasound of the right   upper quadrant with further decision making pending the results of this   test.  Will continue with IV fluids, p.r.n. IV antiemetics and IV   narcotics.  I have requested the patient stop his insulin pump in the   short-term and we will provide a basal level of insulin with Lantus and   give additional insulin based on Accu-Cheks.  Blood cultures will be   obtained should he spike another fever and a urine culture will be obtained    as well.   Electronically Signed By:   Blenda Bridegroom, M.D. 09/05/2008 09:59   ____________________________   Blenda Bridegroom, M.D.   [facility]  D:  09/05/2008  T:  09/05/2008 12:04 P   [id number]

## 2008-09-06 NOTE — Discharge Summary (Signed)
[facility]                                DISCHARGE SUMMARY                              Blenda Bridegroom, M.D.   NAME:  Maurice [name], Maurice [name]   MR #:  [id number]                      ADM DATE:   09/04/2008   [name] [id number]                     DIS DATE:     09/06/2008   G#   SS #   [id number]                   DOB:  Aug 18, 1955   Blenda Bridegroom, M.D.                AGE:53                   SEX:  M   cc:    Larry Sierras, M.D.          Blenda Bridegroom, M.D.   DISCHARGE DIAGNOSES   1. Severe epigastric to right upper quadrant pain of uncertain etiology      without recurrence.   2. Insulin dependent diabetes for greater than 25 years on an insulin      pump.   3. Hypertension.   4. Hyperlipidemia.   5. Status post right carotid endarterectomy.   HISTORY OF PRESENT ILLNESS   Please see H&amp;P for details without addition or deletion.   HOSPITAL COURSE   The patient was admitted from the emergency room yesterday morning.  By the   time I saw the patient in the ER yesterday morning he had minimal   tenderness in the epigastrium and right upper quadrant.  He was not   nauseated.  He was kept NPO yesterday with IV fluids and had no recurrence   of his abdominal pain or vomiting since admission.  The patient had a trial   of regular diabetic breakfast this morning without recurrence of his   symptoms.   The patient's abdominal pain was always present but had some crampy   characteristics to it.  When he first hit the ER with pain his liver   enzymes were all within normal limits.  A CT scan of his abdomen and pelvis   showed a small amount of right lower pericolic fluid and a subtle   hypodensity in the ampulla of the pancreas but otherwise was normal.  The   patient subsequently had an ultrasound of his right upper quadrant   performed which was entirely within normal limits.  His initial white blood   cell count was elevated at 17,000 but very quickly normalized yesterday    morning and is again normal this morning.  The patient has had no fever.   His abdominal exam this morning is entirely benign.   The case was discussed with the patient this morning going over multiple   potential etiologies of his abdominal pain.  With the complete resolution   and other negative findings objectively will proceed with a more   conservative approach and discharge the patient home.  He has  agreed to   contact me promptly should he have any recurrence of abdominal pain and   further workup could be arranged at that time.   The patient's blood sugars were monitored during the hospitalization.  He   was given a dose of Lantus insulin lower than his usual basal amount at 20   units of Lantus and covered with a sliding scale.  It was requested that   the patient discontinue his insulin pump while in the hospital.   A hemoglobin A1c this admission came back with very good control 7.5.  His   lipase level on admission was normal and a repeat amylase and lipase this   morning were again normal as were his liver enzymes and normal white blood   cell count.  As mentioned above a urinalysis was negative.   The patient is going to be discharged on the following medications:   1. Klonopin 0.5 mg p.o. b.i.d.   2. Catapres 0.1 mg p.o. b.i.d.   3. He is going to resume his insulin pump and is aware of keeping his basal      rate a little bit lower until the effects of the Lantus from yesterday      have worn off.   4. Zestril 10 mg p.o. daily.   5. Timolol eye drops 0.25 mg 1 drop each eye daily.   6. Omeprazole 20 mg daily.   7. He can resume his alpha lipoic acid daily.   8. Vitamin E daily.   9. Vitamin D daily.   10. Lipitor 10 mg daily.   Again the patient is aware of contacting me immediately should he have any   recurrence of his abdominal symptoms.   Electronically Signed By:   Blenda Bridegroom, M.D. 09/12/2008 12:01   ____________________________   Blenda Bridegroom, M.D.    LE  D:  09/06/2008  T:  09/06/2008 10:47 P   [id number]

## 2009-02-04 DIAGNOSIS — E1051 Type 1 diabetes mellitus with diabetic peripheral angiopathy without gangrene: Secondary | ICD-10-CM | POA: Insufficient documentation

## 2011-08-09 ENCOUNTER — Emergency Department: Payer: Self-pay | Admitting: Emergency Medicine

## 2011-08-13 NOTE — ED Provider Notes (Signed)
MEDICATION ADMINISTRATION SUMMARY              Drug Name: *Phenergan, Dose Ordered: 25 mg, Route: IM, Status:         Cancelled, Time: 19:45 08/13/2011,          Drug Name: *Dextrose 5% and 0.45% Sodium Chloride, Dose Ordered: 50         mL/hr, Route: IV Fluid, Status: Given, Time: 08:47 08/14/2011,          Drug Name: *Dextrose 50% and Water, Dose Ordered: 25 gm, Route: IV         Push, Status: Given, Time: 06:35 08/14/2011,          Drug Name: *Morphine Sulfate, Dose Ordered: 4 mg, Route: IV Push,         Status: Held, Time: 06:22 08/14/2011,          Drug Name: Morphine Sulfate, Dose Ordered: 4 mg, Route: IV Push,         Status: Given, Time: 05:15 08/14/2011,          Drug Name: Zofran, Dose Ordered: 4 mg, Route: IV Push, Status: Given,         Time: 05:15 08/14/2011,          Drug Name: *Sodium Chloride 0.9%, Intravenous, Dose Ordered: 150         mL/hr, Route: IV Fluid, Status: Given, Time: 01:27 08/14/2011,          Drug Name: Morphine Sulfate, Dose Ordered: 2-4 mg, Route: IV Push,         Status: Given, Time: 00:03 08/14/2011,          Drug Name: Dilaudid, Dose Ordered: 1 mg, Route: IV Push, Status:         Given, Time: 20:27 08/13/2011,          Drug Name: *Zofran, Dose Ordered: 4 mg, Route: IV Push, Status:         Given, Time: 20:26 08/13/2011,          Drug Name: *Sodium Chloride 0.9%, Intravenous, Dose Ordered: 150         mL/hr, Route: IV Fluid, Status: Given, Time: 20:23 08/13/2011,         *Additional information available in notes, Detailed record available         in Medication Service section.       KNOWN ALLERGIES   NKDA       OBS PROGRESS NOTE   OBS PROGRESS NOTE: OBS Progress Notes. pt resting comfortably,         cont pain mgmt. urlogy to see in AM. CT (+) 5.35mm obstructing stone.         (Sat Aug 14, 2011 03:14 JAR0)     OBS Progress Notes. Pt pain currently controlled but RN staff requesting         PRN pain medication for pt. Morphine prn q 6h ordered, pending          urology consult. (Sat Aug 14, 2011 06:15 NJB)     OBS Progress Notes. Saw pt at 9am. He is comfortable and awaiting urology         consult. (Sat Aug 14, 2011 11:24 JLW)       TRIAGE (Fri Aug 13, 2011 17:00 TJM0)   TRIAGE NOTES:  Dx 4 mm kidney stone R kidney Monday at [facility], . PT DROVE SELF TO ED TODAY FROM [place]. (  Fri         Aug 13, 2011 17:00 TJM0)   PATIENT: NAME: Escareno, Feliberto Gottron, AGE: 55, GENDER: male, DOB: Sun         1955-08-30, TIME OF GREET: Fri Aug 13, 2011 16:53, SSN: [id number],         HEIGHT: 172cm, MEDICAL RECORD NUMBER: [phone], ACCOUNT NUMBER:         [id number], PCP: Blenda Bridegroom,. (Fri Aug 13, 2011 17:00 TJM0)     KG WEIGHT: 79.8. (20:46 DMS1)   ADMISSION: URGENCY: 3, TRANSPORT: Ambulatory, DEPT: Emergency,         BED: WAITING. (Fri Aug 13, 2011 17:00 TJM0)   VITAL SIGNS: BP 172/92, (Sitting), Pulse 102, Resp 18, Temp 98.9,         (Oral), Pain 10, O2 Sat 100, on Room air, Time 08/13/2011 16:53.         (16:53 TJM0)   COMPLAINT:  RLQ pain. (Fri Aug 13, 2011 17:00 TJM0)   PRESENTING COMPLAINT:  RLQ pain, d/x kidney stone Monday, 4 mm,         Since 5 days ago. (17:42 WAB1)   PAIN: Patient complains of pain, Pain described as radiating, On         a scale 0-10 patient rates pain as 10, RLQ, Pain is constant, No         modifying factors, No efforts tried to eliminate symptoms. (17:42         WAB1)   TREATMENT PRIOR TO ARRIVAL: None. (17:42 WAB1)   TB SCREENING: Unable to assess for TB. (17:42 WAB1)   ABUSE SCREENING: Patient denies physical abuse or threats. (17:42         WAB1)   FALL RISK: Fall risk assessment not applicable to this patient.         (17:42 WAB1)   SUICIDAL IDEATION: Suicidal ideation is not present. (17:42         WAB1)   ADVANCE DIRECTIVES: Patient does not have advance directives.         (17:42 WAB1)   PROVIDERS: TRIAGE NURSE: Theodis Sato, RN. (Fri Aug 13, 2011         17:00 TJM0)    PREVIOUS VISIT ALLERGIES: Nkda. Caleen Essex Aug 13, 2011 17:00         TJM0)       PRESENTING PROBLEM (Fri Aug 13, 2011 17:00 TJM0)      Presenting problems: Abdominal Pain, Flank Pain.       CURRENT MEDICATIONS   Aspirin:  162 mg Oral once a day. (17:01 TJM0)   Klonopin:  0.5 mg Oral once a day (at bedtime). (17:01 TJM0)   Humalog:  Subcutaneous See Notes. insulin pump - titrate         according to glucose levels &amp; carb intake. (17:01 TJM0)   Lisinopril:  20 mg Oral once a day. (17:01 TJM0)   Lipitor:  20 mg Oral once a day. (17:01 TJM0)   Clonidine Hydrochloride:  0.1 mg Oral 2 times a day. (17:02         TJM0)   Timolol Maleate:  1 gtt Eye once a day. to bilat eyes in AM.         (17:02 TJM0)   Omeprazole:  40 mg Oral once a day. in PM. (17:02 TJM0)   Gabapentin:  300 mg Oral once a day (at bedtime). (17:03         TJM0)   Zantac:  300 mg Oral once a day (at bedtime). (17:03 TJM0)   Percocet 10/325:  1 tab Oral As needed every 6 hours. prn kidney         pain. (17:04 TJM0)   Flomax:  0.4 mg Oral once a day. (17:04 TJM0)   Zofran:  4 mg Oral As needed every 6 hours. prn n/v. (17:04         TJM0)       MEDICATION SERVICE   Dextrose 5% and 0.45% Sodium Chloride:  Order: Dextrose 5% and         0.45% Sodium Chloride (Dextrose/Sodium Chloride) - Dose: 50         mL/hr : IV Fluid         Notes: D5 and 1/2 Normal Saline         *Reminder* Enter reason for fluid below         Ordered by: Hilaria Ota, PA-C         Entered by: Hilaria Ota, PA-C Sat Aug 14, 2011 07:02          Documented as given by: Dara Hoyer ZOX Aug 14, 2011 08:47          Patient, Medication, Dose, Route and Time verified prior to         administration.          Amount given: 50 cc, Connections checked prior to administration,         Line traced prior to administration, Catheter placement confirmed via         flush prior to administration, IV site without signs or symptoms of          infiltration during medication administration, No swelling during         administration, No drainage during administration, IV flushed after         administration, Correct patient, time, route, dose and medication         confirmed prior to administration, Patient advised of actions and         side-effects prior to administration, Allergies confirmed and         medications reviewed prior to administration, Patient in position of         comfort, Side rails up, Cart in lowest position, Call light in reach.    : Follow Up : Response assessment performed, No signs or         symptoms of allergic reaction noted, Increased urine output, Advised         not to ambulate without assistance, Patient in position of comfort,         Side rails up, Cart in lowest position. (Sat Aug 14, 2011 10:50         MNS1)    : Follow Up : Response assessment performed, No signs or         symptoms of allergic reaction noted, _IV SITE #1:_, IV fluid infusion         discontinued, on Sat Aug 14, 2011 11:00, Total fluid hydration time         IV site 1 2 hours, 15 minutes, ., Patient in position of comfort,         Side rails up, Cart in lowest position. (Sat Aug 14, 2011 14:04         MNS1)   Dextrose 50% and Water:  Order: Dextrose 50% and Water (Dextrose)         - Dose: 25  gm : IV Push         Notes: Give 1 amp D50 now, Telephone Order         Ordered by: Hilaria Ota, PA-C         Entered by: Robin Searing, RN Sat Aug 14, 2011 06:40          Documented as given by: Robin Searing, RN Sat Aug 14, 2011 06:35          Patient, Medication, Dose, Route and Time verified prior to         administration.          Amount given: 1 amp, IV SITE #1 into left antecubital, IV SITE #1         IVP, initial medication, Rapidly, Connections checked prior to         administration, Line traced prior to administration, Catheter         placement confirmed via flush prior to administration, IV site          without signs or symptoms of infiltration during medication         administration, No swelling during administration, No drainage during         administration, IV flushed after administration, Correct patient,         time, route, dose and medication confirmed prior to administration,         Patient advised of actions and side-effects prior to administration,         Allergies confirmed and medications reviewed prior to administration,         Patient tolerated procedure well, Administered by mla, rn, Patient in         position of comfort, Side rails up, Cart in lowest position, Call         light in reach,          Co-signed by: Hilaria Ota, PA-C Sat Aug 14, 2011 07:01.   Dilaudid:  Order: Dilaudid (Hydromorphone Hydrochloride) -         Dose: 1 mg : IV Push         Single Dose Exceeded - Rationale: Patient tolerated similiar in past         Ordered by: Candis Shine, M.D.         Entered by: Candis Shine, M.D. Fri Aug 13, 2011 19:45 ,          Acknowledged by: Reggie Pile, RN Fri Aug 13, 2011 19:48         Documented as given by: Reggie Pile, RN Fri Aug 13, 2011 20:27          Patient, Medication, Dose, Route and Time verified prior to         administration.          Amount given: 1 mg, IV SITE #1 into right antecubital, IV SITE #1         IVP, initial medication, Slowly, Connections checked prior to         administration, Line traced prior to administration, Catheter         placement confirmed via flush prior to administration, IV site         without signs or symptoms of infiltration during medication         administration, No swelling during administration, No drainage during         administration, IV flushed after administration, Correct patient,         time, route,  dose and medication confirmed prior to administration,         Patient advised of actions and side-effects prior to administration,         Allergies confirmed and medications reviewed prior to administration,          Patient tolerated procedure well, Patient in position of comfort,         Side rails up, Cart in lowest position, Family at bedside, Call light         in reach.   Morphine Sulfate:  Order: Morphine Sulfate - Dose: 2-4         mg : IV Push         Ordered by: Candis Shine, M.D.         Entered by: Candis Shine, M.D. Fri Aug 13, 2011 19:50 ,          Acknowledged by: Reggie Pile, RN Fri Aug 13, 2011 20:15         Documented as given by: Robin Searing, RN Sat Aug 14, 2011 00:03          Patient, Medication, Dose, Route and Time verified prior to         administration.          Amount given: 4mg , IV SITE #1 into left antecubital, IV SITE #1 IVP,         initial medication, Slowly, subsequent different medication, Slowly,         Connections checked prior to administration, Line traced prior to         administration, Catheter placement confirmed via flush prior to         administration, IV site without signs or symptoms of infiltration         during medication administration, No swelling during administration,         No drainage during administration, IV flushed after administration,         Correct patient, time, route, dose and medication confirmed prior to         administration, Patient advised of actions and side-effects prior to         administration, Allergies confirmed and medications reviewed prior to         administration, Patient tolerated procedure well, Administered by         mla, rn, Patient in position of comfort, Side rails up, Cart in         lowest position, Call light in reach.   Morphine Sulfate:  Order: Morphine Sulfate - Dose: 4 mg         : IV Push         Ordered by: April Manson, PA-C         Entered by: April Manson, PA-C Sat Aug 14, 2011 05:02          Documented as given by: Robin Searing, RN Sat Aug 14, 2011 05:15          Patient, Medication, Dose, Route and Time verified prior to         administration.           Amount given: 4mg , IV SITE #1 into left antecubital, IV SITE #1 IVP,         initial medication, Slowly, subsequent different medication, Slowly,         Connections checked prior to administration, Line traced prior to         administration, Catheter placement confirmed via flush prior to  administration, IV site without signs or symptoms of infiltration         during medication administration, No swelling during administration,         No drainage during administration, IV flushed after administration,         Correct patient, time, route, dose and medication confirmed prior to         administration, Patient advised of actions and side-effects prior to         administration, Allergies confirmed and medications reviewed prior to         administration, Patient tolerated procedure well, Administered by         mla, rn, Patient in position of comfort, Side rails up, Cart in         lowest position, Call light in reach.   Morphine Sulfate:  Order: Morphine Sulfate - Dose: 4 mg         : IV Push         Notes: q 6h prn pain relief; hold if bp &lt; 100/60         Ordered by: Henderson Valparaiso, PA-C         Entered by: Henderson Shoreham, PA-C Sat Aug 14, 2011 06:14 ,          Held by: Robin Searing, RN Sat Aug 14, 2011 06:22 Reason: Pain         controlled at present.   Sodium Chloride 0.9%, Intravenous:  Order: Sodium Chloride 0.9%,         Intravenous (Sodium Chloride) - Dose: 150 mL/hr : IV Fluid         Notes: *Reminder* Enter reason for fluid below         Ordered by: Candis Shine, M.D.         Entered by: Candis Shine, M.D. Fri Aug 13, 2011 19:50 ,          Acknowledged by: Reggie Pile, RN Fri Aug 13, 2011 20:15         Documented as given by: Reggie Pile, RN Fri Aug 13, 2011 20:23          Patient, Medication, Dose, Route and Time verified prior to         administration.          Amount given: 150 mL/hr, IV SITE #1 into right antecubital, IV SITE          #1 IV fluids established, IV SITE #1 1st bag hung, amount 1 Liter, IV         SITE #1 Rate of infusion (non-bolus) Infusing at 150 ml/hr, via         primary tubing, via pump tubing, Connections checked prior to         administration, Line traced prior to administration, Catheter         placement confirmed via flush prior to administration, IV site         without signs or symptoms of infiltration during medication         administration, No swelling during administration, No drainage during         administration, IV flushed after administration, Correct patient,         time, route, dose and medication confirmed prior to administration,         Patient advised of actions and side-effects prior to administration,         Allergies confirmed and medications reviewed prior to administration,  Patient tolerated procedure well, Patient in position of comfort,         Side rails up, Cart in lowest position, Family at bedside, Call light         in reach.    : Follow Up : Response assessment performed, _IV SITE #1:_, IV         fluid infusion continued upon transfer from emergency department.         (20:32 ANB5)    : Follow Up : No signs or symptoms of allergic reaction noted,         Site inspection shows, No swelling at administration site, No         drainage at administration site, No bleeding at site, No bruising         noted at site, _IV SITE #1:_, IV fluid infusion discontinued, on Sat         Aug 14, 2011 01:27, Total fluid hydration time IV site 1 5 hours, 5         minutes, ., Total amount infused: , Advised not to ambulate         without assistance, Patient in position of comfort, Side rails up,         Cart in lowest position, Call light in reach. (Sat Aug 14, 2011 01:28         MLA1)   Sodium Chloride 0.9%, Intravenous:  Order: Sodium Chloride 0.9%,         Intravenous (Sodium Chloride) - Dose: 150 mL/hr : IV Fluid         Notes: *Reminder* Enter reason for fluid below          Ordered by: Candis Shine, M.D.         Entered by: Robin Searing, RN Sat Aug 14, 2011 01:27          Documented as given by: Robin Searing, RN Sat Aug 14, 2011 01:27          Patient, Medication, Dose, Route and Time verified prior to         administration.          Amount given: 13ml/hr, IV SITE #1 into left antecubital, IV SITE #1         IV fluids established, IV SITE #1 2nd bag hung, amount 1 Liter, IV         SITE #1 Rate of infusion (non-bolus) Infusing at 150 ml/hr, via         primary tubing, IV SITE #1 on IV pump, IV SITE #1 Tubing changed to         pump tubing, Connections checked prior to administration, Line traced         prior to administration, Catheter placement confirmed via flush prior         to administration, IV site without signs or symptoms of infiltration         during medication administration, No swelling during administration,         No drainage during administration, IV flushed after administration,         Correct patient, time, route, dose and medication confirmed prior to         administration, Patient advised of actions and side-effects prior to         administration, Allergies confirmed and medications reviewed prior to         administration, Patient tolerated procedure well, Patient in position  of comfort, Side rails up, Cart in lowest position, Call light in         reach.    : Follow Up : Response assessment performed, No signs or         symptoms of allergic reaction noted, Decreased symptoms, Site         inspection shows, No swelling at administration site, No drainage at         administration site, No bleeding at site, No bruising noted at site,         _IV SITE #1:_, Total amount infused: 1000, Advised not to ambulate         without assistance, Patient in position of comfort, Side rails up,         Cart in lowest position. (Sat Aug 14, 2011 08:44 MNS1)   Zofran:  Order: Zofran (Ondansetron Hydrochloride) -         Dose: 4 mg : IV Push          Notes: PRN nausea or vomiting every 4 hours         Ordered by: Candis Shine, M.D.         Entered by: Candis Shine, M.D. Fri Aug 13, 2011 19:50 ,          Acknowledged by: Reggie Pile, RN Fri Aug 13, 2011 20:15         Documented as given by: Reggie Pile, RN Fri Aug 13, 2011 20:26          Patient, Medication, Dose, Route and Time verified prior to         administration.          Amount given: 4 mg, IV SITE #1 into right antecubital, IV SITE #1         IVP, initial medication, Slowly, Connections checked prior to         administration, Line traced prior to administration, Catheter         placement confirmed via flush prior to administration, IV site         without signs or symptoms of infiltration during medication         administration, No swelling during administration, No drainage during         administration, IV flushed after administration, Correct patient,         time, route, dose and medication confirmed prior to administration,         Patient advised of actions and side-effects prior to administration,         Allergies confirmed and medications reviewed prior to administration,         Patient tolerated procedure well, Patient in position of comfort,         Side rails up, Cart in lowest position, Family at bedside, Call light         in reach.   Zofran:  Order: Zofran (Ondansetron Hydrochloride) -         Dose: 4 mg : IV Push         Ordered by: Candis Shine, M.D.         Entered by: Candis Shine, M.D. Fri Aug 13, 2011 19:45 ,          Acknowledged by: Reggie Pile, RN Fri Aug 13, 2011 19:48         Documented as given by: Robin Searing, RN Sat Aug 14, 2011 05:15          Patient, Medication, Dose,  Route and Time verified prior to         administration.          Amount given: 4mg , IV SITE #1 into left antecubital, IV SITE #1 IVP,         initial medication, Slowly, subsequent different medication, Slowly,          Connections checked prior to administration, Line traced prior to         administration, Catheter placement confirmed via flush prior to         administration, IV site without signs or symptoms of infiltration         during medication administration, No swelling during administration,         No drainage during administration, IV flushed after administration,         Correct patient, time, route, dose and medication confirmed prior to         administration, Patient advised of actions and side-effects prior to         administration, Allergies confirmed and medications reviewed prior to         administration, Patient tolerated procedure well, Administered by         mla, rn, Patient in position of comfort, Side rails up, Cart in         lowest position, Call light in reach.   (CANCELLED) Phenergan:  Order: Phenergan (Promethazine         Hydrochloride) - Dose: 25 mg : IM         Notes: For Infusion change route to IV Med infusion         For IV Dose: Dilute in 50ml NS.         Ordered by: Belva Agee, PA-C         Entered by: Belva Agee, PA-C [facility] Aug 13, 2011 17:48          Cancelled by: Candis Shine, M.D.. Fri Aug 13, 2011 19:45          Cancel reason: Change in medication plan.       ORDERS   Urine dip (send for lab U/A if positive):  Ordered for: Sherlon Handing,         M.D., Christiane Ha         Status: Done by Aline August, PM, Eliezer Mccoy Aug 13, 2011 19:07. (17:47         NAK)   CBC, AUTOMATED DIFFERENTIAL:  Ordered for: Sherlon Handing, M.D., Christiane Ha         Status: Cancelled by Leanna Sato, [facility] Fri Aug 13, 2011 18:50.         (18:29 NAK)   O2 sat Monitor:  Ordered for: Sherlon Handing, M.D., Christiane Ha         Status: Cancelled by Leanna Sato, [facility] Fri Aug 13, 2011 18:49.         (18:29 NAK)   12 LEAD EKG:  Ordered for: Sherlon Handing, M.D., Christiane Ha         Status: Cancelled by Leanna Sato, [facility] Fri Aug 13, 2011 18:50.         (18:29 NAK)   COMPREHENSIVE METABOLIC PANEL:  Ordered for: Sherlon Handing, M.D.,          Christiane Ha         Status: Cancelled by Leanna Sato, [facility] Fri Aug 13, 2011 18:50.         (18:29 NAK)   IV- Saline Lock:  Ordered forSherlon Handing, M.D., Christiane Ha  Status: Cancelled by Orbie Pyo Fri Aug 13, 2011 18:49.         (18:29 NAK)   Cardiac Monitor:  Ordered for: Sherlon Handing, M.D., Christiane Ha         Status: Cancelled by Leanna Sato, [facility] Fri Aug 13, 2011 18:49.         (18:29 Lum Babe)   MYOGLOBIN (BLOOD):  Ordered for: Sherlon Handing, M.D., Christiane Ha         Status: Cancelled by Leanna Sato, [facility] Fri Aug 13, 2011 18:50.         (18:29 NAK)   CPK PROFILE:  Ordered for: Sherlon Handing, M.D., Christiane Ha         Status: Cancelled by Leanna Sato, [facility] Fri Aug 13, 2011 18:50.         (18:29 NAK)   BP Monitor:  Ordered for: Sherlon Handing, M.D., Christiane Ha         Status: Cancelled by Leanna Sato, [facility] Fri Aug 13, 2011 18:49.         (18:29 NAK)   TROPONIN I:  Ordered for: Sherlon Handing, M.D., Christiane Ha         Status: Cancelled by Leanna Sato, [facility] Fri Aug 13, 2011 18:50.         (18:29 Lum Babe)   D-DIMER:  Ordered forSherlon Handing, M.D., Christiane Ha         Status: Cancelled by Leanna Sato, [facility] Fri Aug 13, 2011 18:50.         (18:29 NAK)   Urine HCG:  Ordered for: Sherlon Handing, M.D., Christiane Ha         Status: Cancelled by Leanna Sato, [facility] Fri Aug 13, 2011 18:50.         (18:29 NAK)   CHEST 2 VIEWS:  Ordered for: Sherlon Handing, M.D., Christiane Ha         Status: Cancelled by Leanna Sato, [facility] Fri Aug 13, 2011 18:50.         (18:29 NAK)   HAND HELD NEBULIZER:  Ordered for: Sherlon Handing, M.D., Christiane Ha         Status: Cancelled by Leanna Sato, [facility] Fri Aug 13, 2011 18:51.         (18:29 NAK)   LIPASE:  Ordered for: Sherlon Handing, M.D., Christiane Ha         Status: Cancelled by Leanna Sato, [facility] Fri Aug 13, 2011 18:50.         (18:29 NAK)   Did pt urinate?:  Ordered for: Sherlon Handing, M.D., Christiane Ha         Status: Done by Aline August, PM, Eliezer Mccoy Aug 13, 2011 19:07. (18:35         NAK)    Page Dr Mallie Mussel al (urology):  Ordered for: Sherlon Handing, M.D., Christiane Ha         Status: Done by Colon, Bjorn Pippin Aug 13, 2011 19:17. (19:14         JAR0)   CBC, AUTOMATED DIFFERENTIAL:  Ordered for: Sherlon Handing, M.D., Christiane Ha         Status: Done by System Fri Aug 13, 2011 20:26. (19:41 JAR0)   BASIC METABOLIC PANEL:  Ordered for: Sherlon Handing, M.D., Christiane Ha         Status: Done by System Fri Aug 13, 2011 20:31. (19:41 Lavonia Dana)   ED OBSERVATION for CEP:  Ordered for: Sherlon Handing, M.D., Christiane Ha         Status: Done by Colon, Bjorn Pippin Aug 13, 2011 19:48. (19:46         JAR0)   CT ABD &amp; PELVIS  W/O CONTRAST:  Ordered for: Sherlon Handing, M.D.,         Christiane Ha         Status: Active. (19:47 JAR0)   IV Saline lock - Flush per hospital policy:  Ordered for: Sherlon Handing,         M.D., Christiane Ha         Status: Done by Rande Lawman, RN, [facility] Aug 14, 2011 00:05. (19:48         JAR0)   Diet &amp; Activity as tolerated, encourage liquids:  Ordered for:         Sherlon Handing, M.D., Christiane Ha         Status: Done by Rande Lawman RN, [facility] Aug 14, 2011 00:05. (19:48         [place])   NPO@ midnight:  Ordered for: Sherlon Handing, M.D., Christiane Ha         Status: Done by Rande Lawman, RN, [facility] Aug 14, 2011 00:05. (19:48         [place])   Vital Signs Q 4 hours while awake:  Ordered for: Sherlon Handing, M.D.,         Christiane Ha         Status: Done by Rande Lawman RN, [facility] Aug 14, 2011 00:05. (19:48         [place])   May take meds per Med Recon:  Ordered for: Sherlon Handing, M.D., Christiane Ha         Status: Done by Rande Lawman, RN, [facility] Aug 14, 2011 00:05. (19:48         [place])   Consult Dr. : Darin Engels (urology) - will see in AM:  Ordered for:         Sherlon Handing, M.D., Christiane Ha         Status: Done by Laural Benes III, Bonita Quin Aug 13, 2011 20:55. (19:48         [place])   Accucheck Q 4hrs:  Ordered for: Sherlon Handing, M.D., Christiane Ha         Status: Done by Rande Lawman RNElon Jester ZOX Aug 14, 2011 03:53. (Sat Aug 14, 2011 03:14 JAR0)   Transfer to OR Dr. Micah Noel:  Ordered for: Cipriano Mile, MD, Onalee Hua          Status: Done by Dara Hoyer Sat Aug 14, 2011 14:42. (Sat Aug 14, 2011 12:54 JLW)       NURSING ASSESSMENT: ABDOMEN (17:49 WAB1)   CONSTITUTIONAL: History obtained from patient, Patient arrives         ambulatory, Gait steady, Patient appears comfortable, Patient         cooperative, Patient alert, Oriented to person, place and time, Skin         warm, Skin dry, Skin normal in color, Mucous membranes pink, Mucous         membranes moist, Patient is well-groomed.   PAIN: sharp pain, to the right lower quadrant, constant, on a         scale 0-10 patient rates pain as 10, Pain exacerbated by nothing,         Pain relieved by, pain medications, prescription medications.   ABDOMEN: Abdomen assessment findings include abdomen symmetrical,         Abdomen soft, non-tender, no pulsatile mass, Notes: patient diagnosed         with kidney stone on monday, 4 mm, per patient it won't go away, I  took a pain pill and that helped a little, but it's really hurting.   GENITOURINARY MALE: Associated with urinary complaints described         as, difficulty urinating, dysuria.       NURSING ASSESSMENT: GENITOURINARY (20:50 MLA1)   NURSING DIAGNOSIS: Nursing diagnosis: renal colic.   CONSTITUTIONAL: Patient arrives, via hospital wheelchair, Gait         steady, Patient appears comfortable, Patient cooperative, Patient         alert, Oriented to person, place and time, Skin warm, Skin dry, Skin         normal in color, Mucous membranes pink, Mucous membranes moist,         Patient is well-groomed.   PAIN MALE: sharp pain, on a scale 0-10 patient rates pain as 6,         Pain exacerbated by nothing, Pain relieved by, opiate medications,         morphine, dilaudid.   GENITOURINARY MALE: Notes: pt last urinated at 1900 in ER just         before OBS admission. Pt states that it wasn't much.   ABDOMEN: Abdomen assessment findings include abdomen symmetrical,          Abdomen soft, non-tender, no pulsatile mass, Bowel sound normal, no         associated nausea, no associated vomiting, no associated diarrhea.   SAFETY: Side rails up, Cart/Stretcher in lowest position, Call         light within reach, Hospital ID band on.       NURSING ASSESSMENT: HEAD-TO-TOE (Sat Aug 14, 2011 08:15 MNS1)   CONSTITUTIONAL: Patient appears comfortable, Patient cooperative,         Patient alert, Oriented to person, place and time, Skin warm, Skin         dry, Skin normal in color, Mucous membranes pink, Mucous membranes         moist, Patient is well-groomed, Patient complains of renal colic, NS         at 150cc/hr via pump. Urology consult pending.       NURSING PROCEDURE: ADMISSION (20:30 ANB5)   ADMISSION: Report called to, RN, Provided opportunity to answer         questions, Report called at 2030, Patient Admited at. ERO,         Transported via wheelchair, Accompanied by emergency department         technician.       NURSING PROCEDURE: COMMUNICATIONS   COMMUNICATIONS: Notes: cbg 107 mg/dl. (Sat Aug 14, 2011 08:38         [facility])     Notes: CBG 171 MG/DL. PT C/O INCREASING PAIN. RN MADE AWARE. (Sat Aug 14, 2011 10:27 [facility])   SAFETY: Side rails up, Cart/Stretcher in lowest position, Family         at bedside, Call light within reach, Hospital ID band on. (Sat Aug 14, 2011 08:38 [facility])     Side rails up, Cart/Stretcher in lowest position, Family at bedside, Call         light within reach, Hospital ID band on. (Sat Aug 14, 2011 10:27         [facility])       NURSING PROCEDURE: IV (19:01 JMB2)   PATIENT IDENITIFIER: Patient's identity verified by patient         stating name, Patient's  identity verified by patient stating birth         date, Patient's identity verified by hospital ID bracelet.   IV SITE 1: IV established, to the left antecubital, using a 20         gauge catheter, in one attempt, Saline lock established, Flushed with          normal saline (mls): 10, Labs drawn at time of placement, labeled in         the presence of the patient and sent to lab, Labs drawn at 1845,         Tourniquet removed from patient after procedure., Labs labeled in the         presence of the patient and then sent to the Lab.   FOLLOW-UP SITE 1: After procedure, 2x3 ensure dressing applied,         After procedure, no drainage at IV site, After procedure, no swelling         at IV site, After procedure, no redness at IV site.   SAFETY: Side rails up, Cart/Stretcher in lowest position, Family         at bedside, Call light within reach, Hospital ID band on.       NURSING PROCEDURE: NURSE NOTES   NURSES NOTES: Patient in no apparent distress, Patient resting         quietly. (22:54 DMS1)     Patient in no apparent distress, Patient states decreased pain,         Assistance offered to patient, Ice chips given to patient, Beverage         given to patient, Meal tray given to patient, Notes: Assumed care of         pt. No s/sx of acute distress noted. Pt states that his pain is 6/10,         pt states that he feels better and requests no pain meds at this         time. Pt given educational material on renal colic. Pt given         opportunity to ask questions. Pt given urinal and strainer and asked         to strain urine and save any stones for nurse. (20:51 MLA1)     Patient in no apparent distress, Assistance offered to patient, Ice chips         given to patient, Beverage given to patient, Notes: Pt requests to         take his OWN at home medications. PAC C. Towns ordered that it was         alright for pt to take his own meds. Pt med recon reviewed and is         correct as entered in computer. Medications that pt took this evening         are as follows: Aspirin, clonodine, gabapentin, klonopin, lipitor,         omeprazole, and zantac. Medications that pt was to take this evening         were verified with PAC C. Towns prior to pt administering them to          himself. (22:05 MLA1)     Notes: Pt has insulin pump which does automatic continual glucose checks.         current glucose reading from insulin pump at 2205 is 156. Pt basal         daily amount 32.4  units. Pt does a Bolus with meals with carb counts.         (22:05 MLA1)     Patient assisted to bathroom with steady gait, Patient in no apparent         distress, Assistance offered to patient, Notes: Pt now NPO, reminded         pt to not eat/drink anything. Pt up to bathroom with urinal/strainer.         Pt states that he voided a good amount, no stones noted, but he had         to force his urine out, pt states that he didn't have any burning         on urination. No s/sx of acute distress noted. Pt pain back up to         10/10, so pain meds administered. (Sat Aug 14, 2011 00:03 MLA1)     Patient in no apparent distress, Patient resting quietly, Notes: No s/sx         of acute distress noted. Pt resting quietly in bed. NS infusing thru         PIV, PIV WNL. (Sat Aug 14, 2011 02:12 MLA1)     Patient in no apparent distress, Patient resting quietly, Notes: No s/sx         of acute distress noted. Pt resting quietly in bed. (Sat Aug 14, 2011         04:15 MLA1)     Patient in no apparent distress, Patient resting quietly, Assistance         offered to patient, Notes: No s/sx of acute distress noted. Pt pain         6/10 to Rt flank area. Pt states that pain woke him up. PAC C, Towns         notified and pain meds were ordered and given along with antiemetics.         Pt is NPO currently. Will continue to monitor pt. (Sat Aug 14, 2011         05:15 MLA1)     Notes: PAC N. Brosky notified to request an order for PRN morphine 4mg .         Awaiting response. (Sat Aug 14, 2011 06:00 MLA1)     Notes: Pt called nurse into room. Pt has insulin pump, which keeps         continual track on blood glucose. Pt stated that monitor alerted him         to low glucose and reading was 55. Fingerstick for glucometer [facility]          53. PAC Nelson [place] notified. Pt has been NPO past MN for         consult. Pt turned off insulin pump. Order received from [facility] for 1         AMP D50 x1 now. Pt states that he just doesn't feel right and he         feels like his blood sugar is low. Med given. Will recheck blood         sugar in 15 mins. (Sat Aug 14, 2011 06:30 MLA1)     Notes: Pt blood glucose finger stick recheck revealed 180. Pt states that         he is feeling better. Pt asked to turn his insulin pump back on, but         explained reason why it needed to be  off. (Sat Aug 14, 2011 06:59         MLA1)     Notes: Order for D5 &amp; 45 NS. Pt resting. (Sat Aug 14, 2011 09:00         MNS1)     Notes: Consult completed. Will go to OR. Completed pre-op checklist .         (Sat Aug 14, 2011 10:08 MNS1)     Patient assisted to bathroom with steady gait, Patient states increased         pain, Notes: Pt remains NPO, waiting for OR. Pt c/o pain 10/10.         Discussed level of pain with OR, will provide medication in SAU. (Sat         Aug 14, 2011 10:45 MNS1)     Notes: Pt transferred to OR fpr procedure. Transported to OR by OR tech.         Pt stated that he has arranged transporation to home. Pt is aware         that he may need to be admitted post procedure. (Sat Aug 14, 2011         11:00 MNS1)       NURSING PROCEDURE: TRANSPORT TO TESTS (18:40 BNW)   PATIENT IDENTIFIER: Patient's identity verified by patient         stating name, Patient's identity verified by patient stating birth         date, Patient's identity verified by hospital ID bracelet.   TRANSPORT TO TESTS: Patient transported to x-ray, ambulatory,         Accompanied by x-ray technician.   FOLLOW-UP: After procedure, patient returned to emergency         department.       DIAGNOSIS (19:44 JAR0)   FINAL: PRIMARY: Renal colic.       DISPOSITION   PATIENT:  Disposition Type: Observation, Disposition: ED         Observation, Condition: Stable. (19:44 JAR0)       Patient left the department. (Sat Aug 14, 2011 14:58 MNS1)       VITAL SIGNS   VITAL SIGNS: BP: 172/92 (Sitting), Pulse: 102, Resp: 18, Temp:         98.9 (Oral), Pain: 10, O2 sat: 100 on Room air, Time: 08/13/2011         16:53. (16:53 TJM0)     BP: 145/90, Pulse: 90, Resp: 18, Temp: 98.4 (Oral), Pain: 9, O2 sat: 98         on Room air, Time: 08/13/2011 20:29. (20:29 ANB5)     BP: 167/76 (Sitting), Pulse: 114, Resp: 18, Temp: 98.3 (Oral), Pain: 10,         O2 sat: 97 on Room air, Time: 08/13/2011 20:50. (20:50 DMS1)     BP: 143/83 (Lying), Resp: 18, Pain: 10, Time: 08/14/2011 00:00. (Sat Aug 14, 2011 00:00 MLA1)     BP: 113/61 (Lying), Pulse: 96, Resp: 18, Temp: 98.0 (Oral), Pain: 6, O2         sat: 96 on Room air, Time: 08/14/2011 04:58. (Sat Aug 14, 2011 04:58         MLA1)     BP: 109/77 (Lying), Pulse: 86, Resp: 16, Temp: 97.9 (Oral), Pain: 4, O2         sat: 97 on Room air, Time: 08/14/2011 08:57. (Sat Aug 14, 2011 08:57  [facility])       PRESCRIPTION     No recorded prescriptions       ADMIN   DIGITAL SIGNATURE:  Hyacinth Meeker, RN, Tammy. (19:00 TJM0)      Dara Hoyer. (Sat Aug 14, 2011 14:58 MNS1)   Key:     ANB5=Bermudez, RN, Amui  BNW=Williams, RAD TECH, Britnee  CATO=Towns,     PA-C, Eber Jones     DMS1=Snak, ACT III, Georgetta Haber, M.D., Christiane Ha  JLW=Whittington,     PA-C, Caprice Renshaw     JMB2=Snowden, LPN, Shanda Bumps  GNF6=OZHYQM, RN, Elon Jester  MNS1=Schwarga, Corrie Dandy     NAK=Kushner, PA-C, Nichole  NJB=Brosky, PA-C, Weston Brass  NVS=Santiago, PA-C,     [employer]     SRH8=Huber, PM, Sarah  TJM0=Miller, RN, Tammy  WAB1=Bennetch, RN, [employer]

## 2011-08-25 LAB — AMB POC URINALYSIS DIP STICK AUTO W/O MICRO
Bilirubin (UA POC): NEGATIVE
Ketones (UA POC): NEGATIVE
Nitrites (UA POC): NEGATIVE
Protein (UA POC): NEGATIVE mg/dL
Specific gravity (UA POC): 1.015 (ref 1.001–1.035)
Urobilinogen (UA POC): 0.2 (ref 0.2–1)
pH (UA POC): 7 (ref 4.6–8.0)

## 2011-08-25 NOTE — Patient Instructions (Signed)
MyChart Activation    Thank you for requesting access to MyChart. Please follow the instructions below to securely access and download your online medical record. MyChart allows you to send messages to your doctor, view your test results, renew your prescriptions, schedule appointments, and more.    How Do I Sign Up?    1. In your internet browser, go to [email or web address]  2. Click on the First Time User? Click Here link in the Sign In box. You will be redirect to the New Member Sign Up page.  3. Enter your MyChart Access Code exactly as it appears below. You will not need to use this code after you???ve completed the sign-up process. If you do not sign up before the expiration date, you must request a new code.    MyChart Access Code: Burke Keels  Expires: 11/23/2011  4:12 PM (This is the date your MyChart access code will expire)    4. Enter the last four digits of your Social Security Number (xxxx) and Date of Birth (mm/dd/yyyy) as indicated and click Submit. You will be taken to the next sign-up page.  5. Create a MyChart ID. This will be your MyChart login ID and cannot be changed, so think of one that is secure and easy to remember.  6. Create a MyChart password. You can change your password at any time.  7. Enter your Password Reset Question and Answer. This can be used at a later time if you forget your password.   8. Enter your e-mail address. You will receive e-mail notification when new information is available in MyChart.  9. Click Sign Up. You can now view and download portions of your medical record.  10. Click the Download Summary menu link to download a portable copy of your medical information.    Additional Information    If you have questions, please visit the Frequently Asked Questions section of the MyChart website at [email or web address] Remember, MyChart is NOT to be used for urgent needs. For medical emergencies, dial 911.

## 2011-08-25 NOTE — Progress Notes (Signed)
08/25/2011    Maurice [name] is a 56 y.o. white male follows up after Right ureteroscopy 08/14/11 at [facility] (Dr Micah Noel) for a 5mm right distal ureteral stone.   Pt is doing well, no pain -- stent still in place.   No f/c/n/v.  No voiding difficulty.      PMH/PSH/Meds & allergies reviewed.     Urinalysis shows:   Results for orders placed in visit on 08/25/11   AMB POC URINALYSIS DIP STICK AUTO W/O MICRO       Component Value Range    Color Yellow  (none)    Clarity Clear  (none)    Glucose 1+  (none)    Bilirubin Negative  (none)    Ketones Negative  (none)    Spec.Grav. 1.015  1.001 - 1.035    Blood 2+  (none)    pH 7.0  4.6 - 8.0    Protein Negative  Negative mg/dL    Urobilinogen 0.2 mg/dL  0.2 - 1    Nitrites Negative  (none)    Leukocyte esterase Trace  (none)       Physical Exam:  BP 120/78   Ht 5\' 8"  (1.727 m)   Wt 176 lb (79.833 kg)   BMI 26.76 kg/m2  WNWN WM in NAD,   HEENT: NCAT, EOMI  Neck: symmetrical  Chest: normal respiratory effort  CV: RRR  Abdomen: soft, NDNT, no CVAT -- Insulin pump present  Extremities: No c/c/e   Neuro: A&O x 3       Today, the ureteral stent was removed with a string easily (located at tip of meatus)      Assessment:   1. Right distal ureteral stone 5mm, s/p ureteroscopy with stone extraction 08/14/11       Plan:   1. Stone prevention discussed:  A) Increase fluid consumption to make at least 2-2.5 litre of urine per day or consume at least 3 liters of fluid daily.  B) Decrease dietary Salt (or Sodium) intake --to lower urinary Calcium  C) Avoid dark drinks (ie, Coffee, Tea, Colas), nuts, chocolate --to lower urinary Oxalate  D) Lemonade: 4 oz lemon juice mixed with 2 litre water, sweetened to taste --Increases urinary Citrate  2. Pt to call for result of stone analysis from [facility]  F/U PRN      Etheleen Nicks, MD    Time spent: 15 minutes, >50% in counseling  Cc:  THOMAS L MAUSER

## 2012-03-14 NOTE — ED Provider Notes (Signed)
MEDICATION ADMINISTRATION SUMMARY         Drug Name: Cipro I.V., Dose Ordered: 400mg  , Route: IV Med Infusion,         Status: Ordered, Time: 20:16 03/14/2012,         Drug Name: Flagyl, Dose Ordered: 500mg  , Route: IV Med Infusion,         Status: Given, Time: 20:36 03/14/2012,         Drug Name: *Sodium Chloride 0.9%, Intravenous, Dose Ordered: 1000 mL,         Route: IV Fluid, Status: Given, Time: 19:41 03/14/2012,         Drug Name: Tylenol Caplet, Dose Ordered: 650 mg, Route: Oral, Status:         Given, Time: 19:40 03/14/2012,         Drug Name: Morphine Sulfate, Dose Ordered: 2mg  , Route: IV Push,         Status: Given, Time: 18:38 03/14/2012,         Drug Name: Zofran, Dose Ordered: 4 mg , Route: IV Push, Status:         Given, Time: 18:38 03/14/2012,         Drug Name: *Sodium Chloride 0.9%, Intravenous, Dose Ordered: 1000 mL,         Route: IV Fluid, Status: Given, Time: 16:03 03/14/2012,         Drug Name: Zofran ODT, Dose Ordered: 4 mg, Route: Oral, Status:         Given, Time: 16:03 03/14/2012,         Drug Name: Morphine Sulfate, Dose Ordered: 4 mg , Route: IV Push,         Status: Given, Time: 16:03 03/14/2012, *Additional information         available in notes, Detailed record available in Medication Service         section.   KNOWN ALLERGIES   NKDA   TRIAGE (Tue Mar 14, 2012 14:48 BNT1)   PATIENT: DOB: Sun 09/23/1955, TIME OF GREET: Tue Mar 14, 2012         14:48 by Montey Hora, RN, LANGUAGE: [place]. (Tue Mar 14, 2012         14:48 BNT1)     NAME: Maurice [name], Maurice [name]. (14:52 SNB3)   ADMISSION: URGENCY: 4, TRANSPORT: Ambulatory, DEPT: Emergency,         BED: WAITING. (Tue Mar 14, 2012 14:48 BNT1)   COMPLAINT:  fever body aches/pains. (Tue Mar 14, 2012 14:48 BNT1)   PAIN:  cough and fever. (15:12 KSR0)   TB SCREENING: TB screen negative for this patient. (15:12 KSR0)   ABUSE SCREENING: Patient denies physical abuse or threats. (15:12         KSR0)   FALL RISK: Patient has a low risk of falling. (15:12 KSR0)    SUICIDAL IDEATION: Suicidal ideation is not present. (15:12 KSR0)   ADVANCE DIRECTIVES: Patient does not have advance directives.         (15:12 KSR0)   PROVIDERS: TRIAGE NURSE: Montey Hora, RN. (Tue Mar 14, 2012         14:48 BNT1)   PREVIOUS VISIT ALLERGIES: NKDA. (15:12 KSR0)   PRESENTING PROBLEM (Tue Mar 14, 2012 14:48 BNT1)      Presenting problems: Fever, Body Aches (general).   GREET (14:48 BNT1)   GREET: Greet: Tue Mar 14, 2012 14:48.     Name: Maurice [name], Maurice [name]  DOB: 30-Jul-1956 M56 MedRec: [id number]  AcctNum:  [id number]   CURRENT MEDICATIONS   Aspirin : Tablet : 81 Mg : Oral:  162 mg Oral once a day. (15:32         KSR0)   CloNIDine Hydrochloride : Tablet : 0.1 Mg : Oral:  0.1 mg Oral 2         times a day. (15:32 KSR0)   Gabapentin : Capsule : 300 Mg : Oral:  300 mg Oral once a day.         (15:33 KSR0)   HumaLOG : Solution : 100 Units/Ml : Subcutaneous:  insulin pump.         (15:33 KSR0)   KlonoPIN : Tablet : 0.5 Mg : Oral:  0.5 mg Oral once a day.         (15:33 KSR0)   Lipitor : Tablet : 20 Mg : Oral:  20 mg Oral once a day. (15:34         KSR0)   Lisinopril : Tablet : 20 Mg : Oral:  20 mg Oral once a day.         (15:34 KSR0)   PriLOSEC : Delayed Release Capsule : 20 Mg : Oral (15:34 KSR0)   Percocet 10/325 : Tablet : 325 Mg-10 Mg : Oral (15:35 KSR0)   Zantac 150 : Tablet : 150 Mg : Oral (15:35 KSR0)   MEDICATION SERVICE   Cipro I.V.:  Order: Cipro I.V. (Ciprofloxacin/Dextrose) -         Dose: 400mg  : IV Med Infusion         Ordered by: Micki Riley, M.D.         Entered by: Micki Riley, M.D. Tue Mar 14, 2012 20:16 .   Flagyl:  Order: Flagyl (Metronidazole) - Dose: 500mg  :         IV Med Infusion         Ordered by: Micki Riley, M.D.         Entered by: Micki Riley, M.D. Tue Mar 14, 2012 20:17 ,         Acknowledged by: Dustin Flock, RN, CEN Tue Mar 14, 2012 20:30         Documented as given by: Dustin Flock, RN, CEN Tue Mar 14, 2012 20:36          Patient, Medication, Dose, Route and Time verified prior to         administration.          Amount given: 500mg , IV SITE #1, IV SITE #1 IVPB or drip, initial         infusion, Premixed, IVPB mixed in: , via pump tubing, on an IV         pump.   : Follow Up : _IV SITE #1:_, Medication infusion continued upon         transfer from emergency department, on Tue Mar 14, 2012 20:52, 20         minutes, . (20:52 JJP0)   Morphine Sulfate:  Order: Morphine Sulfate - Dose: 4 mg         : IV Push         Ordered by: Micki Riley, M.D.         Entered by: Micki Riley, M.D. Tue Mar 14, 2012 15:41 ,         Acknowledged by: Franki Monte, RN Tue Mar 14, 2012 15:42         Documented as given by: Franki Monte, RN Tue Mar 14, 2012 16:03         Patient, Medication, Dose, Route and Time verified prior to         administration.          Amount given: 4mg , IV SITE #1 into left antecubital, IV SITE #1 IVP,         initial medication, Slowly, Connections checked prior to         administration, Line traced prior to administration, Catheter         placement confirmed via flush prior to administration, IV site     Name: Maurice [name], Maurice [name]  DOB: 07-21-1956 M56 MedRec: [id number]  AcctNum:     [id number]         without signs or symptoms of infiltration during medication         administration, No swelling during administration, No drainage during         administration, IV flushed after administration, Correct patient,         time, route, dose and medication confirmed prior to administration,         Patient advised of actions and side-effects prior to administration,         Allergies confirmed and medications reviewed prior to administration.   Morphine Sulfate:  Order: Morphine Sulfate - Dose: 2mg          : IV Push         Ordered by: Micki Riley, M.D.         Entered by: Micki Riley, M.D. Tue Mar 14, 2012 18:29 ,         Acknowledged by: Franki Monte, RN Tue Mar 14, 2012 18:35          Documented as given by: Franki Monte, RN Tue Mar 14, 2012 18:38         Patient, Medication, Dose, Route and Time verified prior to         administration.          Amount given: 2mg , IV SITE #1 into left antecubital, IV SITE #1 IVP,         initial medication, Slowly, Connections checked prior to         administration, Line traced prior to administration, Catheter         placement confirmed via flush prior to administration, IV site         without signs or symptoms of infiltration during medication         administration, No swelling during administration, No drainage during         administration, IV flushed after administration, Correct patient,         time, route, dose and medication confirmed prior to administration,         Patient advised of actions and side-effects prior to administration,         Allergies confirmed and medications reviewed prior to administration.   Sodium Chloride 0.9%, Intravenous:  Order: Sodium Chloride 0.9%,         Intravenous (Sodium Chloride) - Dose: 1000 mL : IV Fluid         Notes: For Hydration         Ordered by: Phil Dopp, PA-C         Entered by: Phil Dopp, PA-C Tue Mar 14, 2012 15:14 ,         Acknowledged by: Franki Monte, RN Tue Mar 14, 2012 15:36         Documented as  given by: Franki Monte, RN Tue Mar 14, 2012 16:03         Patient, Medication, Dose, Route and Time verified prior to         administration.          Amount given: 1000, IV SITE #1 into left antecubital, IV SITE #1 IV         fluids established, IV SITE #1 1st bag hung, amount 1 Liter, IV SITE         #1 bolus of 1000 ml established, IV SITE #1 Rate of bolus, 1000         ml/hr, via primary tubing, Connections checked prior to         administration, Line traced prior to administration, Catheter         placement confirmed via flush prior to administration, IV site         without signs or symptoms of infiltration during medication          administration, No swelling during administration, No drainage during         administration, IV flushed after administration, Correct patient,         time, route, dose and medication confirmed prior to administration,         Patient advised of actions and side-effects prior to administration,         Allergies confirmed and medications reviewed prior to administration.   : Follow Up : _IV SITE #1:_, IV fluid infusion discontinued, on         Tue Mar 14, 2012 18:52, Total fluid hydration time IV site 1 2 hours,         50 minutes, ., Total amount infused: . (20:52 JJP0)   Sodium Chloride 0.9%, Intravenous:  Order: Sodium Chloride 0.9%,         Intravenous (Sodium Chloride) - Dose: 1000 mL : IV Fluid         Notes: For Hydration     Name: Maurice [name], Maurice [name]  DOB: 02/28/1956 M56 MedRec: [id number]  AcctNum:     [id number]         Ordered by: Micki Riley, M.D.         Entered by: Micki Riley, M.D. Tue Mar 14, 2012 18:26 ,         Acknowledged by: Franki Monte, RN Tue Mar 14, 2012 18:35         Documented as given by: Dustin Flock, RN, CEN Tue Mar 14, 2012 19:41         Patient, Medication, Dose, Route and Time verified prior to         administration.          Amount given: , IV SITE #1, IV SITE #1 IV fluids established,         IV SITE #1 bolus of 1000 ml established, via gravity tubing, IV SITE         #1 IVP.   : Follow Up : _IV SITE #1:_, IV fluid infusion continued upon         transfer from emergency department, on Tue Mar 14, 2012 20:52, Total         fluid hydration time IV site 1 1 hour, 15 minutes, . (20:52 JJP0)   Tylenol Caplet:  Order: Tylenol Caplet (Acetaminophen) -         Dose: 650 mg : Oral         Ordered by: Micki Riley, M.D.  Entered by: Micki Riley, M.D. Tue Mar 14, 2012 19:33 ,         Acknowledged by: Dustin Flock, RN, CEN Tue Mar 14, 2012 19:36         Documented as given by: Dustin Flock, RN, CEN Tue Mar 14, 2012 19:40          Patient, Medication, Dose, Route and Time verified prior to         administration.          Amount given: 650mg , Site: Medication administered P.O., Correct         patient, time, route, dose and medication confirmed prior to         administration, Patient advised of actions and side-effects prior to         administration, Allergies confirmed and medications reviewed prior to         administration, Patient in position of comfort, Side rails up, Cart         in lowest position.   Zofran:  Order: Zofran (Ondansetron Hydrochloride) -         Dose: 4 mg : IV Push         Ordered by: Micki Riley, M.D.         Entered by: Micki Riley, M.D. Tue Mar 14, 2012 18:29 ,         Acknowledged by: Franki Monte, RN Tue Mar 14, 2012 18:35         Documented as given by: Franki Monte, RN Tue Mar 14, 2012 18:38         Patient, Medication, Dose, Route and Time verified prior to         administration.          Amount given: 4mg , IV SITE #1 into left antecubital, IV SITE #1 IVP,         initial medication, Slowly, Connections checked prior to         administration, Line traced prior to administration, Catheter         placement confirmed via flush prior to administration, IV site         without signs or symptoms of infiltration during medication         administration, No swelling during administration, No drainage during         administration, IV flushed after administration, Correct patient,         time, route, dose and medication confirmed prior to administration,         Patient advised of actions and side-effects prior to administration,         Allergies confirmed and medications reviewed prior to administration.   Zofran ODT:  Order: Zofran ODT (Ondansetron) - Dose: 4         mg : Oral         Ordered by: Phil Dopp, PA-C         Entered by: Phil Dopp, PA-C Tue Mar 14, 2012 15:14 ,         Acknowledged by: Franki Monte, RN Tue Mar 14, 2012 15:35          Documented as given by: Franki Monte, RN Tue Mar 14, 2012 16:03         Patient, Medication, Dose, Route and Time verified prior to     Maurice [name], Maurice [name]  DOB: January 27, 1956 M56 MedRec: [id number]  AcctNum:     [id number]         administration.  Amount given: 4mg , Site: Medication administered P.O.   ORDERS   CBC, AUTOMATED DIFFERENTIAL:  Ordered for: Tsuchitani, M.D., [place] Dec         Status: Done by: System - Tue Mar 14, 2012 15:55. (15:14 MMA)   CHEST 2 VIEWS:  Ordered for: Tsuchitani, M.D., [place] Dec         Status: Active. (15:14 MMA)   COMPREHENSIVE METABOLIC PANEL:  Ordered for: Tsuchitani, M.D.,         [place] Dec         Status: Done by: System - Tue Mar 14, 2012 16:18. (15:14 MMA)   IV- Saline Lock:  Ordered for: Tsuchitani, M.D., [place] Dec         Status: Done by: Molli Knock - Tue Mar 14, 2012 15:41.         (15:14 MMA)   LIPASE:  Ordered for: Hervey Ard, M.D., [place] Dec         Status: Done by: System - Tue Mar 14, 2012 16:16. (15:14 MMA)   Urine dip (send for lab U/A if positive):  Ordered for:         Tsuchitani, M.D., [place] Dec         Status: Done by: Molli Knock - Tue Mar 14, 2012 15:42.         (15:14 MMA)   BP Monitor:  Ordered for: Hervey Ard, M.D., [place] Dec         Status: Done by: Molli Knock - Tue Mar 14, 2012 15:42.         (15:40 SNT0)   Cardiac Monitor:  Ordered for: Hervey Ard, M.D., [place] Dec         Status: Done by: Molli Knock - Tue Mar 14, 2012 15:42.         (15:40 SNT0)   CPK PROFILE:  Ordered for: Hervey Ard, M.D., [place] Dec         Status: Done by: System - Tue Mar 14, 2012 16:31. (15:40 SNT0)   MYOGLOBIN (BLOOD):  Ordered for: Tsuchitani, M.D., [place] Dec         Status: Done by: System - Tue Mar 14, 2012 16:31. (15:40 SNT0)   O2 sat Monitor:  Ordered for: Hervey Ard, M.D., [place] Dec         Status: Done by: Nils Pyle Mar 14, 2012 15:42.         (15:40 SNT0)   page dr Emmit Alexanders:  Lenna Gilford for: Hervey Ard, M.D., [place] Dec          Status: Done by: Mariane Duval - Tue Mar 14, 2012 15:44. (15:42         SNT0)   PO Contrast for CT:  Ordered for: Tsuchitani, M.D., [place] Dec         Status: Done by: Junita Push RN, Iantha Fallen - Tue Mar 14, 2012 16:03.         (15:48 SNT0)   CT ABDOMEN &amp; PELVIS W/CONT:  Ordered forHervey Ard, M.D., [place] Dec         Status: Active. (15:56 MMA)   IV Set - Primary Tubing:  Ordered for: Tsuchitani, M.D., [place] Dec         Status: Active. (17:17 KSR0)   IV Start kit:  Ordered for: Tsuchitani, M.D., [place] Dec         Status: Active. (17:17 KSR0)   Elita Boone IV Cath:  Ordered for: Tsuchitani, M.D., [place] Dec         Status: Active. (17:17 KSR0)   please obtain urine:  Ordered for:  Tsuchitani, M.D., [place] Dec         Status: Done by: Louie Bun, Elzie Rings Mar 14, 2012 18:25.         (17:46 SNT0)   update pulse please:  Ordered for: Tsuchitani, M.D., [place] Dec     Name: Maurice [name], Maurice [name]  DOB: 05-30-1956 M56 MedRec: [id number]  AcctNum:     [id number]         Status: Done by: Molli Knock - Tue Mar 14, 2012 18:26.         (18:05 SNT0)   page dr Emmit Alexanders:  Ordered for: Hervey Ard, M.D., [place] Dec         Status: Done by: Charna Busman - Tue Mar 14, 2012 19:43.         (19:35 SNT0)   CLOSTRIDIUM DIFFICILE TOXIN by PCR:  Ordered for: Tsuchitani,         M.D., [place] Dec         Status: Active. (20:16 SNT0)   OVA AND PARASITES:  Ordered for: Tsuchitani, M.D., [place] Dec         Status: Active. (20:16 SNT0)   STOOL CULTURE:  Ordered for: Tsuchitani, M.D., [place] Dec         Status: Active. (20:16 SNT0)   HOSPITAL OBSERVATION for Dr Emmit Alexanders:  Lenna Gilford for: Hervey Ard,         M.D., [place] Dec         Status: Done by: Charna Busman - Tue Mar 14, 2012 20:19.         (20:17 SNT0)   please feed:  Ordered for: Hervey Ard, M.D., [place] Dec         Status: Done by: Jordan Likes, RN, CEN, Tinnie Gens - Tue Mar 14, 2012 20:26.         (20:25 SNT0)   NURSING ASSESSMENT: RESPIRATORY /CHEST (15:09 KSR0)   CONSTITUTIONAL: History obtained from patient, Patient arrives          ambulatory, Gait steady, Patient appears, uncomfortable, Patient         cooperative, Patient alert, Oriented to person, place and time, Skin         warm, Skin dry, Skin normal in color.   RESPIRATORY/CHEST: Respiratory assessment findings include         respiratory effort easy, Respirations regular, Conversing normally,         Associated with cough, loose.   ENT: Ear assessment findings include ear normal to inspection,         Nasal assessment findings include nose normal to inspection, Mouth         and throat assessment findings include mouth inspection normal.   SAFETY: Cart/Stretcher in lowest position, Call light within         reach, Hospital ID band on.   NURSING PROCEDURE: ADMISSION (20:50 JJP0)   ADMISSION: Patient admitted to a medical-surgical unit, room         number 5102, Report faxed to unit, Fax receipt confirmed, Patient         Admited at. 2050, Acuity level urgent, Accompanied by emergency         department technician.   BELONGINGS: Belongings remain with patient, Valuables remain with         patient.   NURSING PROCEDURE: BEDSIDE TESTING (18:26 ERE1)   PATIENT IDENTIFIER: Patient's identity verified by patient         stating name, Patient's identity verified by patient stating birth         date, Patient's identity verified  by hospital ID bracelet.   URINE DIP: Urine dip results include: Negative for leukocytes,         Negative for nitrites, Urine clear in color, and clear.   SAFETY: Side rails up, Cart/Stretcher in lowest position, Call         light within reach, Hospital ID band on.     Name: Maurice [name], Maurice [name]  DOB: 03-18-1956 M56 MedRec: [id number]  AcctNum:     [id number]   NURSING PROCEDURE: COMMUNICATIONS (16:08 KSR0)   COMMUNICATIONS: Notes: finished po contrast at 87.   NURSING PROCEDURE: NURSE NOTES   NURSES NOTES: Notes: PO CONTRAST STARTED AT 1555. (15:57 ERE1)     Notes: patient wishes to discuss possible spider bites with Tiera Mensinger.         (19:33 JJP0)      Notes: Patient requesting additional pain meds. (20:25 JJP0)   NURSING PROCEDURE: TRANSPORT TO TESTS   PATIENT IDENTIFIER: Patient's identity verified by patient         stating name, Patient's identity verified by patient stating birth         date, Patient's identity verified by hospital ID bracelet. (15:29         MNL)     Patient's identity verified by patient stating name, Patient's identity         verified by patient stating birth date, Patient's identity verified         by hospital ID bracelet. (18:51 ANR)     Patient's identity verified by hospital ID bracelet. (19:25 JJP0)   TRANSPORT TO TESTS: Patient transported to x-ray, via cart,         Accompanied by x-ray technician, Hand-off report received from         Ellorin, ACT III, Erlinda. (15:29 MNL)     Patient transported to CT scan, via cart, Accompanied by transport         technician, Hand-off report received from Rocky Link RN. (18:51 ANR)     Transport indicated to facilitate diagnosis, Patient transported to CT         scan. (19:25 JJP0)   FOLLOW-UP: After procedure, patient returned to emergency         department, Hand-off report was given to Ellorin, ACT III, Erlinda.         (15:29 MNL)     After procedure, patient returned to emergency department. (19:25 JJP0)   SAFETY: Side rails up, Cart/Stretcher in lowest position, Call         light within reach, Hospital ID band on. (15:29 MNL)   DIAGNOSIS (20:17 SNT0)   FINAL: PRIMARY: colitis, ADDITIONAL: febrile illnes, myalgias.   DISPOSITION   PATIENT:  Disposition Type: Observation, Disposition: Hospital         Observation Regular Bed, Condition: Stable. (20:17 SNT0)      Patient left the department. (20:54 JJP0)   VITAL SIGNS   VITAL SIGNS: BP: 99/85 (Sitting), Pulse: 110, Resp: 18, Temp:         99.3 (Oral), Pain: 5-6, O2 sat: 98 on Room air, Time: 03/14/2012         14:53. (14:55 MNT0)     BP: 116/56, Time: 03/14/2012 15:39. (15:39 SNT0)      BP: 112/60 (Lying), Pain: 4, Time: 03/14/2012 17:09. (17:18 ERE1)     BP: 100/63 (Lying), Pulse: 103, Resp: 18, Time: 03/14/2012 18:19. (18:19         ERE1)     BP: 130/69, Temp: 102.9 (Oral), Pain: 7,  O2 sat: 94 on Room air, Time:         03/14/2012 19:30. (19:32 JJP0)     BP: 119/67, Pulse: 109, Resp: 16, Pain: 7, O2 sat: 96 on Room air, Time:     Name: Maurice [name], Maurice [name]  DOB: 1956-08-09 M56 MedRec: [id number]  AcctNum:     [id number]         03/14/2012 20:45. (20:45 JJP0)   EVENTS   DOCTOR EXTENDER: Maurice [name] saw the patient at [facility] Mar 14, 2012 15:17. (15:07 MMA)   TRANSFER:  Triage to Emergency Waiting. (Tue Mar 14, 2012 14:48         BNT1)      Emergency Waiting to Main 33. (14:53 BNT1)      Removed from Emergency Main 33. (20:54 JJP0)   PRESCRIPTION     No recorded prescriptions   Key:     ANR=Riculan, TRANSPORT, Isaias Cowman BNT1=Tilley, RN, Fredric Mare ERE1=Ellorin, ACT     III,     Alphonzo Lemmings     JJP0=Pool, RN, CEN, Tinnie Gens KSR0=Rodgers, RN, [employer], PA-C,     Michelle     MNL=Lenthall, RAD [facility], Mike MNT0=Townsend, ACT III, Malina SNB3=Banks,     Judeth Cornfield     SNT0=Tsuchitani, M.D., Hunt[place] Dec     Name: Maurice [name], Maurice [name]  DOB: Jul 06, 1956 M56 MedRec: [id number]  AcctNum:     [id number]

## 2012-03-25 NOTE — Procedures (Signed)
Test Reason : Dyspnea   Blood Pressure : ***/*** mmHG   Vent. Rate : 100 BPM     Atrial Rate : 100 BPM      P-R Int : 140 ms          QRS Dur : 080 ms       QT Int : 342 ms       P-R-T Axes : 043 029 -13 degrees      QTc Int : 441 ms   Normal sinus rhythm   Normal ECG   No previous ECGs available   Confirmed by Mary Sella, M.D., Lollie Sails [phone]) on 03/27/2012 6:19:44 PM   Referred By:             Overread By: Harless Litten, M.D.

## 2012-03-25 NOTE — ED Provider Notes (Signed)
MEDICATION ADMINISTRATION SUMMARY         Drug Name: Zosyn, Dose Ordered: 3.375 gm, Route: IV Med Infusion,         Status: Given, Time: 20:21 03/25/2012,         Drug Name: *Dilaudid, Dose Ordered: 1 mg, Route: IV Push, Status:         Given, Time: 19:38 03/25/2012,         Drug Name: Morphine Sulfate, Dose Ordered: 6 mg, Route: IV Push,         Status: Given, Time: 17:40 03/25/2012,         Drug Name: Dilaudid, Dose Ordered: 1 mg, Route: IV Push, Status:         Given, Time: 15:02 03/25/2012,         Drug Name: *Sodium Chloride 0.9%, Intravenous, Dose Ordered: 150         mL/hr, Route: IV Fluid, Status: Given, Time: 14:04 03/25/2012,         Drug Name: Valium, Dose Ordered: 5 mg, Route: Oral, Status: Given,         Time: 12:37 03/25/2012,         Drug Name: Dilaudid, Dose Ordered: 1 mg, Route: IV Push, Status:         Given, Time: 12:28 03/25/2012,         Drug Name: Dilaudid, Dose Ordered: 1 mg, Route: IV Push, Status:         Given, Time: 11:43 03/25/2012,         Drug Name: Zofran, Dose Ordered: 4 mg, Route: IV Push, Status: Given,         Time: 11:43 03/25/2012,         Drug Name: *Sodium Chloride 0.9%, Intravenous, Dose Ordered: 1000 mL,         Route: IV Fluid, Status: Given, Time: 11:42 03/25/2012, *Additional         information available in notes, Detailed record available in         Medication Service section.   KNOWN ALLERGIES   NKDA   TRIAGE (Sat Mar 25, 2012 09:48 TJM0)   PATIENT: DOB: Sun 10-07-1955, LANGUAGE: [place]. (Sat Mar 25, 2012 09:48 TJM0)     NAME: Maurice, [name]. (09:52 TGM)   ADMISSION: URGENCY: 3, TRANSPORT: Ambulatory, DEPT: Emergency,         BED: 2ED 27Iso. (Sat Mar 25, 2012 09:48 TJM0)   COMPLAINT:  neck/back pain - fever. (Sat Mar 25, 2012 09:48 TJM0)   PRESENTING COMPLAINT:  Neck pain, Since 2 days ago. (10:04 WLC1)   PAIN: Patient complains of pain, On a scale 0-10 patient rates         pain as 10. (10:00 WLC1)   TB SCREENING: TB screen negative for this patient. (10:00 WLC1)    ABUSE SCREENING: Patient denies physical abuse or threats. (10:00         WLC1)   FALL RISK: Patient has a low risk of falling. (10:00 WLC1)   SUICIDAL IDEATION: Suicidal ideation is not present. (10:00 WLC1)   ADVANCE DIRECTIVES: Patient does not have advance directives.         (10:00 WLC1)   PROVIDERS: TRIAGE NURSE: Theodis Sato, RN. (Sat Mar 25, 2012         09:48 TJM0)   PREVIOUS VISIT ALLERGIES: NKDA. (10:00 WLC1)   PRESENTING PROBLEM (Sat Mar 25, 2012 09:48 TJM0)      Presenting  problems: Neck Injury-Pain-Swelling, Back         Injury-Pain-Swelling, Fever.   CURRENT MEDICATIONS (10:02 WLC1)   Aspirin : Tablet : 81 Mg : Oral:  162 mg Oral once a day.   CloNIDine Hydrochloride : Tablet : 0.1 Mg : Oral:  0.1 mg Oral 2         times a day.   Gabapentin : Capsule : 300 Mg : Oral:  300 mg Oral once a day.   HumaLOG : Solution : 100 Units/Ml : Subcutaneous:  insulin pump.   KlonoPIN : Tablet : 0.5 Mg : Oral:  0.5 mg Oral once a day.   Lipitor : Tablet : 20 Mg : Oral:  20 mg Oral once a day.   Lisinopril : Tablet : 20 Mg : Oral:  20 mg Oral once a day.   Percocet 10/325 : Tablet : 325 Mg-10 Mg : Oral   PriLOSEC : Delayed Release Capsule : 20 Mg : Oral   Zantac 150 : Tablet : 150 Mg : Oral   Cipro : Tablet : 500 Mg : Oral:  Unknown dosage.   MEDICATION SERVICE   Dilaudid:  Order: Dilaudid (Hydromorphone Hydrochloride) -         Dose: 1 mg : IV Push         Ordered by: Marlana Salvage, PA-C         Entered by: Marlana Salvage, PA-C Sat Mar 25, 2012 10:38 ,         Acknowledged by: Erasmo Leventhal WJX Mar 25, 2012 11:30         Documented as given by: Erasmo Leventhal BJY Mar 25, 2012 11:43         Patient, Medication, Dose, Route and Time verified prior to         administration.          IV SITE #1 into right antecubital, IV SITE #1 IVP, subsequent         different medication, Slowly, Connections checked prior to         administration, Line traced prior to administration, Catheter          placement confirmed via flush prior to administration, IV site         without signs or symptoms of infiltration during medication         administration, No swelling during administration, No drainage during         administration, IV flushed after administration, Correct patient,         time, route, dose and medication confirmed prior to administration,         Patient advised of actions and side-effects prior to administration,         Allergies confirmed and medications reviewed prior to administration,         Patient in position of comfort, Side rails up, Cart in lowest         position, Family at bedside.   : Follow Up : Response assessment performed, No signs or         symptoms of allergic reaction noted, Decreased pain, _IV SITE #1:_.         (13:50 WLC1)   Dilaudid:  Order: Dilaudid (Hydromorphone Hydrochloride) -         Dose: 1 mg : IV Push         Ordered by: Dianna Rossetti, MD         Entered by: Dianna Rossetti, MD [phone]  Mar 25, 2012 12:12         Documented as given by: [place]) Karoline Caldwell, RN Sat Mar 25, 2012         12:28         Patient, Medication, Dose, Route and Time verified prior to         administration.          Amount given: 1mg , IV SITE #1 into left antecubital, Connections         checked prior to administration, Line traced prior to administration,         Catheter placement confirmed via flush prior to administration, IV         site without signs or symptoms of infiltration during medication         administration, No swelling during administration, No drainage during         administration, IV flushed after administration, Correct patient,         time, route, dose and medication confirmed prior to administration,         Patient advised of actions and side-effects prior to administration,         Allergies confirmed and medications reviewed prior to administration.   : Follow Up : Response assessment performed, No signs or          symptoms of allergic reaction noted, Decreased pain, _IV SITE #1:_.         (13:50 WLC1)   Dilaudid:  Order: Dilaudid (Hydromorphone Hydrochloride) -         Dose: 1 mg : IV Push         Ordered by: Dianna Rossetti, MD         Entered by: Dianna Rossetti, MD Sat Mar 25, 2012 14:35 ,         Acknowledged by: Erasmo Leventhal XLK Mar 25, 2012 14:48         Documented as given by: Erasmo Leventhal GMW Mar 25, 2012 15:02         Patient, Medication, Dose, Route and Time verified prior to         administration.         IV SITE #1, IV SITE #1 IVP, subsequent different medication, Slowly,         Connections checked prior to administration, Line traced prior to         administration, Catheter placement confirmed via flush prior to         administration, IV site without signs or symptoms of infiltration         during medication administration, No swelling during administration,         No drainage during administration, IV flushed after administration,         Correct patient, time, route, dose and medication confirmed prior to         administration, Patient advised of actions and side-effects prior to         administration, Allergies confirmed and medications reviewed prior to         administration, Patient in position of comfort, Side rails up, Cart         in lowest position.   Dilaudid:  Order: Dilaudid (Hydromorphone Hydrochloride) -         Dose: 1 mg : IV Push         Notes: Verbal Order         Ordered by: Haze Justin, MD  Entered by: Allyne Gee, RN, CEN [phone] Mar 25, 2012 19:33 ,         Acknowledged by: Allyne Gee, RN, CEN [phone] Mar 25, 2012 19:33         Documented as given by: Allyne Gee, RN, CEN Sat Mar 25, 2012         19:38         Patient, Medication, Dose, Route and Time verified prior to         administration.          Amount given: 1mg , IV SITE #1 into right antecubital, IV SITE #1         IVP, repeat same medication, Slowly, Connections checked prior to          administration, Line traced prior to administration, Catheter         placement confirmed via flush prior to administration, IV site         without signs or symptoms of infiltration during medication         administration, No swelling during administration, No drainage during         administration, IV flushed after administration, Correct patient,         time, route, dose and medication confirmed prior to administration,         Patient advised of actions and side-effects prior to administration,         Allergies confirmed and medications reviewed prior to administration,         Patient in position of comfort, Side rails up, Cart in lowest         position, Call light in reach.   Dilaudid: Response assessment performed, No signs or symptoms of         allergic reaction noted, Decreased pain, _IV SITE #1:_, Pain: 6.         (20:12 BAH2)   Morphine Sulfate:  Order: Morphine Sulfate - Dose: 6 mg         : IV Push         Ordered by: Marlana Salvage, PA-C         Entered by: Marlana Salvage, PA-C Sat Mar 25, 2012 17:32 ,         Acknowledged by: Erasmo Leventhal EAV Mar 25, 2012 17:32         Documented as given by: Erasmo Leventhal WUJ Mar 25, 2012 17:40         Patient, Medication, Dose, Route and Time verified prior to         administration.          IV SITE #1 into right antecubital, IV SITE #1 IVP, subsequent         different medication, Slowly, Connections checked prior to         administration, Line traced prior to administration, Catheter         placement confirmed via flush prior to administration, IV site         without signs or symptoms of infiltration during medication         administration, No swelling during administration, No drainage during         administration, IV flushed after administration, Correct patient,         time, route, dose and medication confirmed prior to administration,         Patient advised of actions and side-effects prior to administration,          Allergies  confirmed and medications reviewed prior to administration,         Patient in position of comfort, Side rails up, Cart in lowest         position, Family at bedside.   : Follow Up : Response assessment performed, No signs or         symptoms of allergic reaction noted, Decreased pain, _IV SITE #1:_,         Advised not to ambulate without assistance, Patient in position of         comfort, Side rails up, Cart in lowest position. (18:46 WLC1)   Sodium Chloride 0.9%, Intravenous:  Order: Sodium Chloride 0.9%,         Intravenous (Sodium Chloride) - Dose: 1000 mL : IV Fluid         Notes: Bolus         *Reminder* Enter reason for fluid below         For Hydration         Ordered by: Marlana Salvage, PA-C         Entered by: Marlana Salvage, PA-C Sat Mar 25, 2012 10:38 ,         Acknowledged by: Erasmo Leventhal WUJ Mar 25, 2012 11:30         Documented as given by: Erasmo Leventhal WJX Mar 25, 2012 11:42         Patient, Medication, Dose, Route and Time verified prior to         administration.          IV SITE #1 into right antecubital, IV SITE #1 IV fluids established,         IV SITE #1 1st bag hung, amount 1 Liter, Connections checked prior to         administration, Line traced prior to administration, Catheter         placement confirmed via flush prior to administration, IV site         without signs or symptoms of infiltration during medication         administration, No swelling during administration, No drainage during         administration, IV flushed after administration, Correct patient,         time, route, dose and medication confirmed prior to administration,         Patient advised of actions and side-effects prior to administration,         Allergies confirmed and medications reviewed prior to administration,         Patient in position of comfort, Side rails up, Cart in lowest         position, Family at bedside.   : Follow Up : Response assessment performed, No signs or          symptoms of allergic reaction noted, _IV SITE #1:_, IV fluid infusion         discontinued, on Sat Mar 25, 2012 13:50, Total fluid hydration time         IV site 1 2 hours, 10 minutes, . (13:50 WLC1)   Sodium Chloride 0.9%, Intravenous:  Order: Sodium Chloride 0.9%,         Intravenous (Sodium Chloride) - Dose: 150 mL/hr : IV Fluid         Notes: Bolus         *Reminder* Enter reason for fluid below         For Hydration  Ordered by: Marlana Salvage, PA-C         Entered by: Marlana Salvage, PA-C Sat Mar 25, 2012 13:58 ,         Acknowledged by: Erasmo Leventhal ZOX Mar 25, 2012 14:04         Documented as given by: Erasmo Leventhal WRU Mar 25, 2012 14:04         Patient, Medication, Dose, Route and Time verified prior to         administration.          IV SITE #1 into right antecubital, IV SITE #1 IV fluids established,         IV SITE #1 2nd bag hung, amount 1 Liter, IV SITE #1 Rate of infusion         (non-bolus) Infusing at 150 ml/hr, Connections checked prior to         administration, Line traced prior to administration, Catheter         placement confirmed via flush prior to administration, IV site         without signs or symptoms of infiltration during medication         administration, No swelling during administration, No drainage during         administration, IV flushed after administration, Correct patient,         time, route, dose and medication confirmed prior to administration,         Patient advised of actions and side-effects prior to administration,         Allergies confirmed and medications reviewed prior to administration,         Patient in position of comfort, Side rails up, Cart in lowest         position.   : Follow Up : _IV SITE #1:_, IV fluid infusion continued upon         transfer from emergency department, on Sat Mar 25, 2012 21:04, Total         fluid hydration time IV site 1 7 hours, . (21:05 [place].[facility])   Valium:  Order: Valium (Diazepam) - Dose: 5 mg : Oral          Ordered by: Dianna Rossetti, MD         Entered by: Dianna Rossetti, MD Sat Mar 25, 2012 12:12         Documented as given by: Silvestre Mesi, RN Sat Mar 25, 2012         12:37         Patient, Medication, Dose, Route and Time verified prior to         administration.         Verbal order verified, Amount given: 5mg , Correct patient, time,         route, dose and medication confirmed prior to administration, Patient         advised of actions and side-effects prior to administration,         Allergies confirmed and medications reviewed prior to administration.   : Follow Up : Response assessment performed, No signs or         symptoms of allergic reaction noted, Decreased pain. (13:51 WLC1)   Zofran:  Order: Zofran (Ondansetron Hydrochloride) -         Dose: 4 mg : IV Push         Ordered by: Marlana Salvage, PA-C         Entered by: Marlana Salvage, PA-C  Sat Mar 25, 2012 10:38 ,         Acknowledged by: Erasmo Leventhal HQI Mar 25, 2012 11:30         Documented as given by: Erasmo Leventhal ONG Mar 25, 2012 11:43         Patient, Medication, Dose, Route and Time verified prior to         administration.          IV SITE #1 into right antecubital, IV SITE #1 IVP, initial         medication, Slowly, Connections checked prior to administration, Line         traced prior to administration, Catheter placement confirmed via         flush prior to administration, IV site without signs or symptoms of         infiltration during medication administration, No swelling during         administration, No drainage during administration, IV flushed after         administration, Correct patient, time, route, dose and medication         confirmed prior to administration, Patient advised of actions and         side-effects prior to administration, Allergies confirmed and         medications reviewed prior to administration, Patient in position of         comfort, Side rails up, Cart in lowest position, Family at bedside.    : Follow Up : Response assessment performed, No signs or         symptoms of allergic reaction noted, _IV SITE #1:_. (18:47 WLC1)   Zosyn:  Order: Zosyn (Piperacillin Sodium/Tazobactam Sodium) -         Dose: 3.375 gm : IV Med Infusion         Ordered by: Haze Justin, MD         Entered by: Haze Justin, MD Sat Mar 25, 2012 20:07 ,         Acknowledged by: Allyne Gee, RN, CEN Sat Mar 25, 2012 20:12         Documented as given by: Allyne Gee, RN, CEN Sat Mar 25, 2012         20:21         Patient, Medication, Dose, Route and Time verified prior to         administration.          Amount given: 3.375g, IV SITE #1 into right antecubital, IV SITE #1         IVPB or drip, initial infusion, IVPB mixed in: 50ml, Fluid: 0.9NS,         via pump tubing, on an IV pump, at 100 ml/hr, Connections checked         prior to administration, Line traced prior to administration,         Catheter placement confirmed via flush prior to administration, IV         site without signs or symptoms of infiltration during medication         administration, No swelling during administration, No drainage during         administration, Correct patient, time, route, dose and medication         confirmed prior to administration, Patient advised of actions and         side-effects prior to administration, Allergies confirmed and         medications reviewed  prior to administration, Patient in position of         comfort, Side rails up, Cart in lowest position, Call light in reach.   : Follow Up : Response assessment performed, No signs or         symptoms of allergic reaction noted, Decreased pain, Site inspection         shows, No swelling at administration site, No drainage at         administration site, _IV SITE #1:_, Medication infusion discontinued,         on Sat Mar 25, 2012 21:00, 40 minutes, ., Total amount infused: 50ml,         IV Line flushed after administration. (21:03 [place])   ORDERS    12 LEAD EKG:  Ordered for: Raynald Kemp, MD, Myriam Jacobson         Status: Active. (10:36 EI)   BP Monitor:  Ordered for: Raynald Kemp, MD, Helen         Status: Done by: Erasmo Leventhal - Sat Mar 25, 2012 11:59. (10:36         EI)   Cardiac Monitor:  Ordered for: Raynald Kemp MD, Myriam Jacobson         Status: Done by: Erasmo Leventhal - Sat Mar 25, 2012 11:59. (10:36         EI)   CBC, AUTOMATED DIFFERENTIAL:  Ordered for: Raynald Kemp, MD, Myriam Jacobson         Status: Done by: System - Sat Mar 25, 2012 13:48. (10:36 EI)   CHEST 2 VIEWS:  Ordered for: Raynald Kemp, MD, Helen         Status: Active. (10:36 EI)   COMPREHENSIVE METABOLIC PANEL:  Ordered for: Raynald Kemp, MD, Myriam Jacobson         Status: Done by: System - Sat Mar 25, 2012 11:22. (10:36 EI)   CPK PROFILE:  Ordered for: Raynald Kemp MD, Helen         Status: Done by: System - Sat Mar 25, 2012 11:22. (10:36 EI)   IV- Saline Lock:  Ordered for: Raynald Kemp, MD, Helen         Status: Done by: Grant Fontana - RUE Mar 25, 2012 11:09.         (10:36 EI)   MYOGLOBIN (BLOOD):  Ordered for: Raynald Kemp, MD, Myriam Jacobson         Status: Done by: System - Sat Mar 25, 2012 11:22. (10:36 EI)   O2 sat Monitor:  Ordered for: Raynald Kemp, MD, Myriam Jacobson         Status: Done by: Erasmo Leventhal - Sat Mar 25, 2012 11:59. (10:36         EI)   Urine dip (send for lab U/A if positive):  Ordered for: Raynald Kemp, MD,         Myriam Jacobson         Status: Done by: Erasmo Leventhal - Sat Mar 25, 2012 13:27. (10:39         EI)   left arm sling:  Ordered for: Raynald Kemp, MD, Myriam Jacobson         Status: Done by: Wilburt Finlay - Sat Mar 25, 2012 12:16. (12:09         [facility])   CTA Chest:  Ordered for: Raynald Kemp MD, Myriam Jacobson         Status: Active         Comment: Suspect pulmonary embolism. (12:13 [facility])   BP Cuff Adult Regular:  Ordered for: Raynald Kemp, MD, Myriam Jacobson  Status: Active. (13:50 WLC1)   CONTINUOUS PULSE OX:  Ordered for: Raynald Kemp, MD, Helen         Status: Active. (13:50 WLC1)   IV Start kit:  Ordered for: Raynald Kemp, MD, Myriam Jacobson         Status: Active. (13:50 WLC1)   MONITOR ELECTRODE:  Ordered for: Raynald Kemp, MD, Helen          Status: Active. (13:50 WLC1)   Elita Boone IV Cath:  Ordered for: Raynald Kemp, MD, Helen         Status: Active. (13:50 WLC1)   order MRI of cervical &amp; thoracic spine (C1-T8) with IV con r/o         epidural abscess:  Ordered for: Raynald Kemp, MD, Myriam Jacobson         Status: Done by: Wilburt Finlay - Sat Mar 25, 2012 14:06. (13:59         EI)   Report using SBAR format to Dr. Nils Flack for handoff.:  Ordered         for: Raynald Kemp, MD, Myriam Jacobson         Status: Active. (15:30 [facility])   Page Dr Emmit Alexanders:  Ordered for: Henrene Hawking, MD, Melvyn Neth         Status: Done by: Wilburt Finlay - Sat Mar 25, 2012 17:48. (17:40         [facility])   O2 2L NC:  Ordered for: Henrene Hawking, MD, Lewis         Status: Done by: Grant Fontana - ZOX Mar 25, 2012 18:20.         (17:49 EI)   BLOOD CULTURE:  Ordered for: Henrene Hawking, MD, Lewis         Status: Active. (17:50 EI)   BLOOD CULTURE:  Ordered for: Henrene Hawking, MD, Lewis         Status: Active. (17:50 EI)   REGULAR for Dr Emmit Alexanders:  Ordered for: Henrene Hawking, MD, Melvyn Neth         Status: Done by: Neila Gear - Sat Mar 25, 2012 20:25. (20:25         [facility]0)   Page Dr Brayton Mars:  Ordered for: Henrene Hawking, MD, Melvyn Neth         Status: Done by: Neila Gear - Sat Mar 25, 2012 20:41. (20:41         L[facility]0)   Dial A Flow Tubing:  Ordered for: Henrene Hawking, MD, Lewis         Status: Active. (21:04 [place])   IV Pump- Single:  Ordered for: Henrene Hawking, MD, Lewis         Status: Active. (21:04 WRU0)   Pump Tubing - Smart Site:  Ordered for: Henrene Hawking, MD, Melvyn Neth         Status: Active. (21:04 [place])   NURSING ASSESSMENT: RESPIRATORY /CHEST (10:09 WLC1)   CONSTITUTIONAL: History obtained from patient, Patient arrives         ambulatory, Gait steady, Patient appears, in distress due to pain,         Patient cooperative, Patient alert, Oriented to person, place and         time, Skin warm, Skin dry, Skin normal in color, Mucous membranes         pink, Mucous membranes moist, Patient is well-groomed, Patient         complains of Neck and shoulder pain, pain with inspiration,  Afebrile.   PAIN: on a scale 0-10 patient rates pain as 10, Neck and upper  back. Pain with inspiration.   RESPIRATORY/CHEST: Respiratory assessment findings include         respiratory effort easy, Respirations regular, Conversing normally,         no signs of distress, Breath sounds clear, to bilateral upper lobes,         to bilateral lower lobes.   SAFETY: Side rails up, Cart/Stretcher in lowest position, Family         at bedside, Call light within reach, Hospital ID band on.   NURSING PROCEDURE: ADMISSION   ADMISSION: Patient admitted to a medical-surgical unit, room         number 2107, Report faxed to unit, Fax receipt confirmed, Notes:         confirmed report received, per secretary, patient has not been         assigned to nurse, will call back in 10 min. (20:46 [id number])     Patient admitted to a medical-surgical unit, room number 2107, Report         faxed to unit, Fax receipt confirmed, by Bunnie Pion, Admission orders         received and completed, Bed assigned at 2107, Report called at 2042,         Transported via cart/stretcher, Accompanied by emergency department         technician, Transported with IV fluids, Transported with basic life         support care. (20:56 [place])   NURSING PROCEDURE: IV (10:54 HNS1)   PATIENT IDENITIFIER: Patient's identity verified by patient         stating name, Patient's identity verified by patient stating birth         date, Patient's identity verified by hospital ID bracelet.   IV SITE 1: IV therapy indicated for hydration, IV therapy         indicated for medication administration, IV established, to the right         antecubital, using a 20 gauge catheter, in one attempt, Flushed with         normal saline (mls): 10 ml, Labs drawn at time of placement, labeled         in the presence of the patient and sent to lab, Tourniquet removed         from patient after procedure., Labs labeled in the presence of the         patient and then sent to the Lab.    NURSING PROCEDURE: NURSE NOTES   NURSES NOTES: Patient states increased pain, Patient is awaiting         disposition. (17:46 WLC1)     Notes: First contact with patient inquires about results and requests         some additional pain meds. Pt on monitor, call light at bedside. Dr         Nils Flack aware of pt request. (19:43 Darius Bump)     Notes: Spoke to Dr Dagmar Hait, verbal order obtianed for additional pain         meds. (19:43 BAH2)     Pillow given to patient, Beverage given to patient, Patient re-positioned         to semi-Fowler's position. (20:29 [place])     Notes: report faxed to floor. [facility] [id number])     Notes: Wife at bedside, pt and family aware of pending admission, awaits         admission ordres from Dr Henrene Hawking. Patent attorney)   NURSING PROCEDURE: TRANSPORT  TO TESTS   PATIENT IDENTIFIER: Patient's identity verified by patient         stating name, Patient's identity verified by patient stating birth         date, Patient's identity verified by hospital ID bracelet. (11:33         KNS4)   TRANSPORT TO TESTS: Patient transported to x-ray, Accompanied by         x-ray technician. (11:33 [id number])     Patient transported to MRI, Accompanied by transport technician. (15:03         WLC1)   FOLLOW-UP: After procedure, patient returned to emergency         department. (11:33 KNS4)     After procedure, patient returned to emergency department. (16:33 [facility])   DIAGNOSIS (19:07 EI)   FINAL: PRIMARY: intractable and persistent neck and back pain,         ADDITIONAL: leukocytosis, recent dx salmonella, under treatment,         symptomatically improving.   DISPOSITION   PATIENT:  Disposition Type: Inpatient, Disposition: Regular Bed         Admission, Condition: Stable. (20:24 LHS0)      Patient left the department. (21:12 BAH2)   VITAL SIGNS   VITAL SIGNS: BP: 131/100 (Sitting), Pulse: 120, Resp: 18, Temp:         98.7 (Oral), Pain: 10, O2 sat: 98 on Room air, Time: 03/25/2012 09:58.         (09:59 AWA0)      BP: 132/82 (Lying), Pulse: 108, Resp: 18, Temp: 99.0 (Oral), Pain: 5, O2         sat: 95 on Room air, Time: 03/25/2012 11:59. (12:00 WLC1)     Pain: 6, Time: 03/25/2012 12:28. (12:28 MSS2)     BP: 155/86 (Lying), Pulse: 105, Resp: 12, Pain: 5, O2 sat: 95 on Room         air, Time: 03/25/2012 13:44. (13:49 WLC1)     BP: 148/68 (Lying), Pulse: 112, Resp: 16, Temp: 99.4 (Oral), Pain: 8, O2         sat: 92 on Room air, Time: 03/25/2012 17:41. (17:46 WLC1)     BP: 142/73 (Lying), Pulse: 110, Resp: 16, Pain: 8, O2 sat: 95 on Room         air, Time: 03/25/2012 18:47. (18:51 WLC1)     Pain: 6, Time: 03/25/2012 20:00. (20:12 BAH2)     BP: 149/79, Pulse: 119, Resp: 17, Temp: 100.0 (Oral), O2 sat: 95 on Room         air, Time: 03/25/2012 20:17. (20:17 BAH2)   EVENTS   DOCTOR EXTENDER: Ronnie Derby, Erin saw the patient at Sat Mar 25, 2012 10:25. (11:02 EI)   TRANSFER:  Triage to Emergency Main [facility]. (Sat Mar 25, 2012         09:48 TJM0)      Emergency Main [facility] to Holding. (21:09 RLS1)      Removed from Emergency Holding. (21:12 BAH2)   PRESCRIPTION (14:54 HHH)   Dilaudid:  Tablet : 2 Mg : Oral : Quantity: *** 2 *** Unit: mg         Route: Oral Schedule: As needed every 4 to 6 hours Dispense: *** 20         ***.   NOTES:  No refills          May use generic.   ADMIN (09:48 TJM0)   DIGITAL SIGNATURE:  Hyacinth Meeker, RN, Tammy.   Key:     AWA0=Anderson, ACTIII, Phineas Semen BAH2=Haladyna, RN, CEN, [employer]	[place],     PA-C, [employer], MD, [employer] HNS1=Sayers, RN, [employer] KNS4=Smart, RAD TECH, Orma Render     LHS0=Siegel, MD, Melvyn Neth MSS2=Squires, RN, Restaurant manager, fast food) RLS1=Senn, RN,     [employer]     TGM=Miles[place]Watkins, Tammy TJM0=Miller, RN, Tammy WLC1=Cantrell, Maurice [name]

## 2012-03-25 NOTE — Procedures (Signed)
Test Reason : Dyspnea   Blood Pressure : ***/*** mmHG   Vent. Rate : 100 BPM     Atrial Rate : 100 BPM      P-R Int : 140 ms          QRS Dur : 080 ms       QT Int : 342 ms       P-R-T Axes : 043 029 -13 degrees      QTc Int : 441 ms   Normal sinus rhythm   Normal ECG   No previous ECGs available   Confirmed by Kanter, M.D., Harry ([id number]) on 03/27/2012 6:19:44 PM   Referred By:             Overread By: Harry Kanter, M.D.

## 2012-03-28 NOTE — Procedures (Signed)
Study ID: [id number]                                                      [facility]                                                      [address] [place], [place] [address]                                 Adult Echocardiogram Report       Name: AINSLEY, SANGUINETTI Date: 03/28/2012 07:45 AM   MRN: [id number]                Patient Location: 4NWG^[id number]^M   DOB: 1956/02/04            Age: 56 yrs   Height: 68 in              Weight: 179 lb                                                                      BSA: 1.9 m2   Gender: Male               Account #: [id number]   Reason For Study: FEVER/ RULE OUT ENDOCARDITIS   History: salmonella,HTN,CAROID DISEASE   Ordering Physician: Caralyn Guile   Performed By: Nancie Neas., RDCS       Interpretation Summary   A complete two-dimensional transthoracic echocardiogram was performed (2D, M-   mode, Doppler and color flow Doppler).   The study was technically adequate.   Normal LV function.   There is mild mitral regurgitation.   There is mild mitral annular calcification.   There is mild tricuspid regurgitation.   There is no previous echo for comparison.   All other findings are noted below.               _____________________________________________________________________________   __       Left Ventricle   The left ventricle is normal in size. There is normal left ventricular wall   thickness. The estimated left ventricular ejection fraction is 55%. The   transmitral spectral Doppler flow pattern is suggestive of impaired LV   relaxation. Left ventricular systolic function is normal. The left ventricular   wall motion is normal.           Right Ventricle   The right ventricle is normal in size and function.  Atria   The left atrial size is normal. Right atrial size is normal. The interatrial    septum is intact with no evidence for an atrial septal defect.           Mitral Valve   There is mild mitral annular calcification. There is no mitral valve stenosis.   There is mild mitral regurgitation.       Tricuspid Valve   The tricuspid valve is normal in structure and function. There is mild   tricuspid regurgitation.       Aortic Valve   The aortic valve is normal in structure and function. No aortic stenosis.       Pulmonic Valve   The pulmonic valve is normal in structure and function.       Great Vessels   The aortic root is normal size.           Pericardium/Pleural   There is no pericardial effusion.       MEASUREMENTS/CALCULATIONS:       MMode/2D Measurements &amp; Calculations   IVSd: 0.93 cm                 LVIDd: 3.9 cm                                 LVIDs: 3.2 cm                                 LVPWd: 1.1 cm           _______________________________________________________________       FS: 17.4 %                    Ao root diam: 2.6 cm                                 Ao root area: 5.3 cm2                                 LA dimension: 3.4 cm       Doppler Measurements &amp; Calculations   MV E max vel: 70.1 cm/sec                 MV dec time: 0.20 sec   MV A max vel: 92.3 cm/sec   MV E/A: 0.76               _______________________________________________________________   TR max vel: 260.9 cm/sec   TR max PG: 27.2 mmHg               Electronically signed byDr Megan Salon, MD   03/28/2012 05:20 PM

## 2012-03-28 NOTE — Procedures (Signed)
Study ID: [id number]                                                      [facility]                                                      [address] [place], [place] [address]                                 Adult Echocardiogram Report       Name: Maurice [name], Maurice [name] Date: 03/28/2012 07:45 AM   MRN: [id number]                Patient Location: 4NWG^[id number]^M   DOB: [birth year]            Age: 56 yrs   Height: 68 in              Weight: 179 lb                                                                      BSA: 1.9 m2   Gender: Male               Account #: [id number]   Reason For Study: FEVER/ RULE OUT ENDOCARDITIS   History: salmonella,HTN,CAROID DISEASE   Ordering Physician: Caralyn Guile   Performed By: Nancie Neas., RDCS       Interpretation Summary   A complete two-dimensional transthoracic echocardiogram was performed (2D, M-   mode, Doppler and color flow Doppler).   The study was technically adequate.   Normal LV function.   There is mild mitral regurgitation.   There is mild mitral annular calcification.   There is mild tricuspid regurgitation.   There is no previous echo for comparison.   All other findings are noted below.               _____________________________________________________________________________   __       Left Ventricle   The left ventricle is normal in size. There is normal left ventricular wall   thickness. The estimated left ventricular ejection fraction is 55%. The   transmitral spectral Doppler flow pattern is suggestive of impaired LV   relaxation. Left ventricular systolic function is normal. The left ventricular   wall motion is normal.           Right Ventricle   The right ventricle is normal in size and function.  Atria   The left atrial size is normal. Right atrial size is normal. The interatrial   septum is  intact with no evidence for an atrial septal defect.           Mitral Valve   There is mild mitral annular calcification. There is no mitral valve stenosis.   There is mild mitral regurgitation.       Tricuspid Valve   The tricuspid valve is normal in structure and function. There is mild   tricuspid regurgitation.       Aortic Valve   The aortic valve is normal in structure and function. No aortic stenosis.       Pulmonic Valve   The pulmonic valve is normal in structure and function.       Great Vessels   The aortic root is normal size.           Pericardium/Pleural   There is no pericardial effusion.       MEASUREMENTS/CALCULATIONS:       MMode/2D Measurements &amp; Calculations   IVSd: 0.93 cm                 LVIDd: 3.9 cm                                 LVIDs: 3.2 cm                                 LVPWd: 1.1 cm           _______________________________________________________________       FS: 17.4 %                    Ao root diam: 2.6 cm                                 Ao root area: 5.3 cm2                                 LA dimension: 3.4 cm       Doppler Measurements &amp; Calculations   MV E max vel: 70.1 cm/sec                 MV dec time: 0.20 sec   MV A max vel: 92.3 cm/sec   MV E/A: 0.76               _______________________________________________________________   TR max vel: 260.9 cm/sec   TR max PG: 27.2 mmHg               Electronically signed byDr Megan Salon, MD   03/28/2012 05:20 PM

## 2012-04-04 DIAGNOSIS — E1140 Type 2 diabetes mellitus with diabetic neuropathy, unspecified: Secondary | ICD-10-CM | POA: Insufficient documentation

## 2012-04-04 DIAGNOSIS — K219 Gastro-esophageal reflux disease without esophagitis: Secondary | ICD-10-CM | POA: Insufficient documentation

## 2012-04-04 MED ORDER — ATORVASTATIN 20 MG TAB
20 mg | ORAL_TABLET | Freq: Every day | ORAL | Status: DC
Start: 2012-04-04 — End: 2012-06-23

## 2012-04-04 MED ORDER — CLONAZEPAM 0.5 MG TAB
0.5 mg | ORAL_TABLET | Freq: Two times a day (BID) | ORAL | Status: DC
Start: 2012-04-04 — End: 2012-12-04

## 2012-04-04 MED ORDER — GABAPENTIN 300 MG CAP
300 mg | ORAL_CAPSULE | Freq: Three times a day (TID) | ORAL | Status: DC
Start: 2012-04-04 — End: 2013-05-01

## 2012-04-04 MED ORDER — CLONAZEPAM 0.5 MG TAB
0.5 mg | ORAL_TABLET | Freq: Every evening | ORAL | Status: DC | PRN
Start: 2012-04-04 — End: 2012-04-04

## 2012-04-04 MED ORDER — RANITIDINE 300 MG TAB
300 mg | ORAL_TABLET | Freq: Every day | ORAL | Status: DC
Start: 2012-04-04 — End: 2012-04-10

## 2012-04-04 MED ORDER — LISINOPRIL 20 MG TAB
20 mg | ORAL_TABLET | Freq: Every day | ORAL | Status: DC
Start: 2012-04-04 — End: 2013-06-22

## 2012-04-04 MED ORDER — CLONIDINE 0.1 MG TAB
0.1 mg | ORAL_TABLET | Freq: Two times a day (BID) | ORAL | Status: DC
Start: 2012-04-04 — End: 2012-07-20

## 2012-04-04 NOTE — Progress Notes (Signed)
Maurice [name] is a 56 y.o.  male and presents with Establish Care       Subjective:    HTN - controlled, but on lisinopril 20 and clonidine.  States clonidine also helps with neuropathy.    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  Followed by endocrine, Dr. Merilyn Baba.  bs usually well controlled, but have been high since starting prednisone.  A1c was 7, 02/2012. ophtho exam sch 8/30 with Dr. Berton Sturgeon.  On neurontin and klonipin qhs for neuropathy in BLE.  States he has a lot of twitching in feet.  Also painful to touch in areas.  States sx are well controlled on current regimen.  Has tried cymbalta but didn't like side effects of cloudy mind.    HLD - on statin and fish oil supplement.  Lipids at goal.      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  Was started on prednisone - is currently on taper.  States he predominately had bilat shoulder pain and neck pain.  Also taking hydrocodone prn at night.  Going to physical therapy currently.      GERd - on PPI and ranitidine.  States sx controlled.    ROS:  Constitutional: No recent weight change. No weakness/fatigue.  No f/c.   Skin: No rashes, change in nails/hair, itching   HENT: No HA, dizziness. No hearing loss/tinnitus.  No nasal congestion/discharge.   Eyes: No change in vision, double/blurred vision or eye pain/redness.    Cardiovascular: No CP/palpitations.  No DOE/orthopnea/PND.   Respiratory: No cough/sputum, dyspnea, wheezing.   Gastointestinal: No dysphagia, reflux.  No n/v.  No constipation/diarrhea.  No melena/rectal bleeding.   Genitourinary: No dysuria, urinary hesitancy, nocturia, hematuria.  No incontinence.   Musculoskeletal: No joint pain/stiffness.  No muscle pain/tenderness.   Endo: No heat/cold intolerance, no polyuria/polydypsia.   Heme: No h/o anemia.  No easy bleeding/bruising.   Allergy/Immunology: No seasonal rhinitis. Denies frequent colds,  sinus/ear infections.   Neurological: No seizures/numbness/weakness.  + paresthesias.   Psychiatric:  No depression, anxiety.     PMH:  Past Medical History   Diagnosis Date   ??? Hypertension    ??? Diabetes mellitus    ??? Elevated cholesterol    ??? High triglycerides    ??? Kidney stone        Patient Active Problem List   Diagnoses Code   ??? Benign hypertension 401.1   ??? Dyslipidemia 272.4   ??? DM (diabetes mellitus) 250.00       PSH:  Past Surgical History   Procedure Date   ??? Hx carotid endarterectomy 2006        SH:  History   Substance Use Topics   ??? Smoking status: Never Smoker    ??? Smokeless tobacco: Never Used   ??? Alcohol Use: 1.5 oz/week     3 Cans of beer per week       FH:  Family History   Problem Relation Age of Onset   ??? Cancer Father      prostate   ??? Parkinsonism Father    ??? Other Other      parkinson uncle       Medications/Allergies:  Current outpatient prescriptions:HYDROcodone-acetaminophen (NORCO) 5-325 mg per tablet, Take  by mouth., Disp: , Rfl: ;  predniSONE (STERAPRED) 5 mg dose pack, Take  by mouth See Admin Instructions. See administration instruction per 5mg  dose pack, Disp: ,  Rfl: ;  atorvastatin (LIPITOR) 20 mg tablet, Take  by mouth daily.  , Disp: , Rfl: ;  aspirin 81 mg tablet, Take 81 mg by mouth.  , Disp: , Rfl:   insulin lispro (HUMALOG) 100 unit/mL injection, by SubCUTAneous route.  , Disp: , Rfl: ;  ranitidine (ZANTAC) 300 mg tablet, Take 300 mg by mouth daily.  , Disp: , Rfl: ;  ascorbic acid (VITAMIN C) 1,000 mg tablet, Take  by mouth.  , Disp: , Rfl: ;  cloNIDine (CATAPRES) 0.1 mg tablet, Take  by mouth two (2) times a day.  , Disp: , Rfl: ;  lisinopril (PRINIVIL) 20 mg tablet, Take  by mouth daily.  , Disp: , Rfl:   timolol (TIMOPTIC-XR) 0.5 % ophthalmic gel-forming, Administer 1 Drop to both eyes daily.  , Disp: , Rfl: ;  omeprazole (PRILOSEC) 20 mg capsule, Take 20 mg by mouth daily.  , Disp: , Rfl: ;  clonazePAM (KLONOPIN) 0.5 mg tablet, Take  by mouth nightly as needed.  , Disp:  , Rfl: ;  gabapentin (NEURONTIN) 300 mg capsule, Take 300 mg by mouth three (3) times daily.  , Disp: , Rfl:   multivitamins-minerals-lutein (CENTRUM SILVER) Tab, Take  by mouth.  , Disp: , Rfl: ;  vitamin e (E GEMS) 1,000 unit capsule, Take 1,000 Units by mouth daily.  , Disp: , Rfl: ;  Cholecalciferol, Vitamin D3, 5,000 unit Tab, Take  by mouth.  , Disp: , Rfl: ;  omega-3 fatty acids-vitamin e (FISH OIL) 1,000 mg cap, Take 1 Cap by mouth.  , Disp: , Rfl: ;  coenzyme q10 10 mg cap, Take  by mouth.  , Disp: , Rfl:   Alpha Lipoic Acid 200 mg Tab, Take  by mouth.  , Disp: , Rfl:     Not on File    Objective:  BP 118/80   Pulse 96   Temp(Src) 98.2 ??F (36.8 ??C) (Oral)   Resp 19   Ht 5\' 8"  (1.727 m)   Wt 165 lb (74.844 kg)   BMI 25.09 kg/m2 Body mass index is 25.09 kg/(m^2).  Constitutional: Well developed, nourished, no distress, alert   HENT: Exterior ears and tympanic membranes normal bilaterally. Supple neck. No thyromegaly or lymphadenopathy. Oropharynx clear and moist mucous membranes.     Eyes: Conjunctiva normal. PERRL.    Cardiovascular: S1, S2.  RRR.  No murmurs/rubs.  No thrills palpated.  No carotid bruits.  Intact distal pulses.  No edema.   Pulmonary/Chest Wall: No abnormalities on inspection.  Clear to auscultation bilaterally. No wheezing/rhonchi.  Normal effort.     GI: Soft, nontender, nondistended.  Normal active bowel sounds.    Musculoskeletal: Gait normal.  tenderness over shoulder girdle bilat.  Decreased ROM in neck.   Neurological: Appropriate.  No focal motor or sensory deficits. Speech normal.   Skin: No lesions/rashes on inspection.     Psych: Appropriate affect, judgement and insight.  Short-term memory intact.         Assessment/Plan:      DMII, reported good control w/ complications (neuropathy and h/o proteinuria) - ck a1c 05/2012.  Also chk urine microalbumin at that time.  Referral to new endocrine for proximity to pt's home.    HLD - cont statin.  Ck lipids 05/2012.  Need records from  prior pcp.    HTN - cont current regimen.  Pt prefers to stay on clonidine.    Peripheral neuropathy from diabetes - cont klonipen.  Discussed risk of dependence.  Pt to get #60 q month.    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  ON pred taper and undergoing PT.  Need records from [facility].    GERD - discussed trial off of zantac.  If sx worsen, can restart.  Cont ppi.    Health Maintenance:   Health Maintenance   Topic Date Due   ??? Hemoglobin A1c Q66m  September 20, 1955   ??? Lipid Panel Q1  1956-06-14   ??? Foot Exam Q1  09/25/1965   ??? Microalbumin Q1  09/25/1965   ??? Influenza Vaccine  04/16/2012   ??? Eye Exam Dilated Q1  04/04/2013   ??? Colon Cancer Scrn Colonoscopy  04/04/2016       Orders Placed This Encounter   ??? REFERRAL TO ENDOCRINOLOGY     Referral Priority:  Routine     Referral Type:  Consultation     Referral Reason:  Specialty Services Required     Requested Specialty:  Endocrinology     Number of Visits Requested:  1   ??? atorvastatin (LIPITOR) 20 mg tablet     Sig: Take 1 Tab by mouth daily.     Dispense:  90 Tab     Refill:  3   ??? ranitidine (ZANTAC) 300 mg tablet     Sig: Take 1 Tab by mouth daily.     Dispense:  90 Tab     Refill:  3   ??? cloNIDine (CATAPRES) 0.1 mg tablet     Sig: Take 1 Tab by mouth two (2) times a day.     Dispense:  60 Tab     Refill:  12   ??? lisinopril (PRINIVIL) 20 mg tablet     Sig: Take 1 Tab by mouth daily.     Dispense:  90 Tab     Refill:  3   ??? gabapentin (NEURONTIN) 300 mg capsule     Sig: Take 1 Cap by mouth three (3) times daily.     Dispense:  90 Cap     Refill:  12   ??? clonazePAM (KLONOPIN) 0.5 mg tablet     Sig: Take 1 Tab by mouth two (2) times a day.     Dispense:  180 Tab     Refill:  1

## 2012-04-20 NOTE — Telephone Encounter (Signed)
Patient call and want to talk to the nurse or doctor about discuss on last visit about salmonella /please cb [phone]

## 2012-04-21 NOTE — Telephone Encounter (Signed)
Patient aware of recommendations. He would like for you to have his results from [facility] and advise him on what to do for long term therapy.

## 2012-04-21 NOTE — Telephone Encounter (Signed)
If his abd pain is that severe, he needs to go to ER for imaging and evaluation.

## 2012-04-21 NOTE — Telephone Encounter (Signed)
Spoke with patient. He has been unable to sleep due to abdominal pains. He has been taking his rx pain medicine around the clock along with ibuprofen (Advil).

## 2012-04-24 NOTE — Telephone Encounter (Signed)
Can we get [facility]'s records faxed over?  I don't have access to their system.

## 2012-04-27 NOTE — Progress Notes (Unsigned)
Records from [facility] 03/2012  Significant for elevated alk phos at 316, AST and ALT nml  Albumin 2.5L  CRP 176    EKG NSR, echo nml  RF neg, ANA neg, CCP neg  SPEP neg

## 2012-04-27 NOTE — Telephone Encounter (Signed)
Done

## 2012-05-03 NOTE — Telephone Encounter (Signed)
Message copied by Leilani Able on Wed May 03, 2012  3:48 PM  ------       Message from: Luanne Bras       Created: Tue May 02, 2012  6:04 PM       Regarding: Test Results Question       Contact: [phone]         Dr. Melina Schools,              The first three Test Results that I got email on today which are dated 04/28/2012 appear to not my test results since I have not had any tests done by [facility] since you became my Dr.              Alice Reichert discuss on my 06/23/2012 appt.              Thanks,              Maurice [name]

## 2012-06-12 NOTE — Telephone Encounter (Signed)
Message copied by Leilani Able on Mon Jun 12, 2012  7:23 AM  ------       Message from: Luanne Bras       Created: Sun Jun 11, 2012  7:59 PM       Regarding: Non-Urgent Medical Question       Contact: [phone]         Dr. Melina Schools,              We have an appointment on Friday November 8th at 9:45 AM.              I'd like to get a flu shot at that time if it is ok.              Also, FYI, I switched endocrinologist from Dr Hermelinda Medicus to Dr Marissa Calamity on 10/25.              Their office is supposed to be forwarding the results of some tests (blood work and urinalysis) to you.              My AIC was 6.4.              I'd also like to discuss the shingles vaccine with you.              thank you.              Maurice [name]  ------

## 2012-06-23 NOTE — Progress Notes (Signed)
Maurice [name] is a 56 y.o. male and presents with Follow-up       Subjective:    HTN - elevated today.  Doesn't monitor bp at home.  bp has been good at previous doctor visits.    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  A1c 6.4 most recently.    HLD - on statin.  Thinks endo checked cholesterol and it was ok.    Pt questioning if he needs shingles vaccine.  Doesn't know if he needs shingles vaccine.    ROS:  Constitutional: No recent weight change. No weakness/fatigue.  No f/c.   Skin: No rashes, change in nails/hair, itching   HENT: No HA, dizziness. No hearing loss/tinnitus.  No nasal congestion/discharge.   Eyes: No change in vision, double/blurred vision or eye pain/redness.    Cardiovascular: No CP/palpitations.  No DOE/orthopnea/PND.   Respiratory: No cough/sputum, dyspnea, wheezing.   Gastointestinal: No dysphagia, reflux.  No n/v.  No constipation/diarrhea.  No melena/rectal bleeding.   Genitourinary: No dysuria, urinary hesitancy, nocturia, hematuria.  No incontinence.   Musculoskeletal: No joint pain/stiffness.  No muscle pain/tenderness.   Endo: No heat/cold intolerance, no polyuria/polydypsia.   Heme: No h/o anemia.  No easy bleeding/bruising.   Allergy/Immunology: No seasonal rhinitis. Denies frequent colds, sinus/ear infections.   Neurological: No seizures/numbness/weakness.  No paresthesias.   Psychiatric:  No depression, anxiety.     The problem list was updated as a part of today's visit.  Patient Active Problem List   Diagnosis Code   ??? Benign hypertension 401.1   ??? Dyslipidemia 272.4   ??? DM (diabetes mellitus) 250.00   ??? 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 Diabetic neuropathy 250.60   ??? GERD (gastroesophageal reflux disease) 530.81       The PSH, FH were reviewed.      SH:  History   Substance Use Topics   ??? Smoking status: Never Smoker    ??? Smokeless tobacco: Never Used   ??? Alcohol Use: 1.5 oz/week     3 Cans of beer per week         Medications/Allergies:  Current Outpatient Prescriptions on  File Prior to Visit   Medication Sig Dispense Refill   ??? omeprazole (PRILOSEC) 20 mg capsule Take 1 Cap by mouth daily.  90 Cap  3   ??? HYDROcodone-acetaminophen (NORCO) 5-325 mg per tablet Take  by mouth.       ??? atorvastatin (LIPITOR) 20 mg tablet Take 1 Tab by mouth daily.  90 Tab  3   ??? cloNIDine (CATAPRES) 0.1 mg tablet Take 1 Tab by mouth two (2) times a day.  60 Tab  12   ??? lisinopril (PRINIVIL) 20 mg tablet Take 1 Tab by mouth daily.  90 Tab  3   ??? gabapentin (NEURONTIN) 300 mg capsule Take 1 Cap by mouth three (3) times daily.  90 Cap  12   ??? clonazePAM (KLONOPIN) 0.5 mg tablet Take 1 Tab by mouth two (2) times a day.  180 Tab  1   ??? aspirin 81 mg tablet Take 81 mg by mouth.         ??? insulin lispro (HUMALOG) 100 unit/mL injection by SubCUTAneous route.         ??? ascorbic acid (VITAMIN C) 1,000 mg tablet Take  by mouth.         ??? timolol (TIMOPTIC-XR) 0.5 % ophthalmic gel-forming Administer 1 Drop to both eyes daily.         ???  multivitamins-minerals-lutein (CENTRUM SILVER) Tab Take  by mouth.         ??? vitamin e (E GEMS) 1,000 unit capsule Take 1,000 Units by mouth daily.         ??? Cholecalciferol, Vitamin D3, 5,000 unit Tab Take  by mouth.         ??? omega-3 fatty acids-vitamin e (FISH OIL) 1,000 mg cap Take 1 Cap by mouth.         ??? coenzyme q10 10 mg cap Take  by mouth.         ??? Alpha Lipoic Acid 200 mg Tab Take  by mouth.         ??? predniSONE (STERAPRED) 5 mg dose pack Take  by mouth See Admin Instructions. See administration instruction per 5mg  dose pack         No current facility-administered medications on file prior to visit.        No Known Allergies    Objective:  BP 141/81   Pulse 84   Temp(Src) 99 ??F (37.2 ??C) (Oral)   Resp 16   Ht 5\' 8"  (1.727 m)   Wt 174 lb (78.926 kg)   BMI 26.46 kg/m2 Body mass index is 26.46 kg/(m^2).  Constitutional: Well developed, nourished, no distress, alert   HENT: Exterior ears and tympanic membranes normal bilaterally. Supple neck. No thyromegaly or lymphadenopathy.  Oropharynx clear and moist mucous membranes.     Eyes: Conjunctiva normal. PERRL.    CV: S1, S2.  RRR.  No murmurs/rubs.  No thrills palpated.  No carotid bruits.  Intact distal pulses.  No edema.   Pulm: No abnormalities on inspection.  Clear to auscultation bilaterally. No wheezing/rhonchi.  Normal effort.     Neuro: A/O x 3. No focal motor or sensory deficits. Speech normal.   Psych: Appropriate affect, judgement and insight.  Short-term memory intact.         Assessment/Plan:      [name] was seen today for follow-up.    Diagnoses and associated orders for this visit:    Benign hypertension  - monitor for now.  Cont current regimen.    Dyslipidemia  - atorvastatin (LIPITOR) 20 mg tablet; Take 1 Tab by mouth daily.  - Labs done at endo, need to get records.    DM (diabetes mellitus)  - insulin lispro (HUMALOG) 100 unit/mL injection; On pump  - Managed by endo    Need for prophylactic vaccination and inoculation against influenza  - INFLUENZA VIRUS VACCINE, SPLIT, PRES. FREE, IN INDIVIDS. >=3 YRS OF AGE, IM  - PR IMMUNIZ ADMIN,1 SINGLE/COMB VAC/TOXOID          Health Maintenance: flu shot given  Health Maintenance   Topic Date Due   ??? Lipid Panel Q1  05-24-1956   ??? Foot Exam Q1  09/25/1965   ??? Influenza Vaccine  04/16/2012   ??? Hemoglobin A1c Q64m  09/04/2012   ??? Microalbumin Q1  03/04/2013   ??? Eye Exam Dilated Q1  04/04/2013   ??? Colon Cancer Scrn Colonoscopy  04/04/2016

## 2012-06-23 NOTE — Patient Instructions (Addendum)
Keep up good work.

## 2012-07-19 NOTE — Telephone Encounter (Signed)
Message copied by Leilani Able on Wed Jul 19, 2012 12:21 PM  ------       Message from: Luanne Bras       Created: Mon Jul 17, 2012  5:32 PM       Regarding: Prescription Question       Contact: [phone]         Dr. Melina Schools,              Would you please check the prescription you sent in for me for Clonidine .1MG ?  It is twice daily, but the quantity is 60 and should be 180 for a 90 day refill. I suspect the prescription is for 30 days instead of 90. The RX # is [phone]               Can you fix this so that my next refill under your prescription is for 180 over 90 days?              There is still a refill left under my previous PCP, Dr. Emmit Alexanders, for the 180 over 90 days if you need a basis. That RX # is [phone]              The pharmacy is [facility], [address] [place], [place] [phone].              thanks,              Maurice [name]       c: [phone]  ------

## 2012-07-20 ENCOUNTER — Encounter

## 2012-07-27 NOTE — Telephone Encounter (Signed)
Message copied by Leilani Able on Thu Jul 27, 2012  9:17 AM  ------       Message from: Luanne Bras       Created: Wed Jul 26, 2012  6:25 PM       Regarding: Prescription Question       Contact: [phone]         Dr. Melina Schools,              I need your help once again with a prescription for my wife, Maurice [name]. When we switched from Dr. Caralyn Guile there were existing prescriptions. One that is running out for Maurice [name] is TRAZODONE 100 MG Tablets. There are no refills left on Dr Heidi Dach prescription (RX # [phone]). It is 1 tablet at bedtime for 90 tablets over a 90 day period. The pharmacy is [facility], [address] [place], [place], [phone].              Dr Heidi Dach office number is [phone] if you need to contact him about the medication.              Can you please look into this and send in a prescription before she runs out? She has 10 tablets left,              Thank you very much for your assistance.              Maurice [name]       H: [phone]       C: [phone]              Maurice [name] Cell: [phone]  ------

## 2012-09-15 LAB — AMB POC URINALYSIS DIP STICK AUTO W/O MICRO
Bilirubin (UA POC): NEGATIVE
Blood (UA POC): NEGATIVE
Glucose (UA POC): NEGATIVE
Ketones (UA POC): NEGATIVE
Leukocyte esterase (UA POC): NEGATIVE
Nitrites (UA POC): NEGATIVE
Protein (UA POC): NEGATIVE mg/dL
Specific gravity (UA POC): 1.015 (ref 1.001–1.035)
Urobilinogen (UA POC): 0.2 (ref 0.2–1)
pH (UA POC): 7 (ref 4.6–8.0)

## 2012-09-15 NOTE — Progress Notes (Signed)
Maurice [name] is a 57 y.o. male and presents with Follow-up       Subjective:    HTN - controlled.  Compliant with meds.  No side effects.  On ACEI and clonidine.  On clondine for neuropathy sx and "twitching of toes".    DMII - managed by endo, Dr. Allene Dillon.  Has neuropathy and uses klonopin and neurontin for that.  bs low right now and feeling shaky (bs 66).  He is eating candy to try and bring it up.    HLD - per pt, managed by endo and controlled.    Pt c/o left lower back pain x 2d- wonders if it's a recurrence of kidney stones (last had on 07/2011).  No hematuria.  Thinks it is a calcium stone.  Feels slightly better today from pain standpoint, but states he just doesn't feel well.  Does note DOE.  No URI sx, but kinda feels like he is coming down with something.    ROS:  Constitutional: No recent weight change. No weakness/fatigue.  No f/c.   Skin: No rashes, change in nails/hair, itching   HENT: No HA, dizziness. No hearing loss/tinnitus.  No nasal congestion/discharge.   Eyes: No change in vision, double/blurred vision or eye pain/redness.    Cardiovascular: No CP/palpitations.  No DOE/orthopnea/PND.   Respiratory: No cough/sputum, dyspnea, wheezing.   Gastointestinal: No dysphagia, reflux.  No n/v.  No constipation/diarrhea.  No melena/rectal bleeding.   Genitourinary: No dysuria, urinary hesitancy, nocturia, hematuria.  No incontinence.   Musculoskeletal: No joint pain/stiffness.  No muscle pain/tenderness.     The problem list was updated as a part of today's visit.  Patient Active Problem List   Diagnosis Code   ??? Benign hypertension 401.1   ??? Dyslipidemia 272.4   ??? DM (diabetes mellitus) 250.00   ??? 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 Diabetic neuropathy 250.60   ??? GERD (gastroesophageal reflux disease) 530.81   ??? History of nephrolithiasis V13.01       The PSH, FH were reviewed.      SH:  History   Substance Use Topics   ??? Smoking status: Never Smoker    ??? Smokeless tobacco: Never Used   ???  Alcohol Use: 1.5 oz/week     3 Cans of beer per week         Medications/Allergies:  Current Outpatient Prescriptions on File Prior to Visit   Medication Sig Dispense Refill   ??? cloNIDine (CATAPRES) 0.1 mg tablet Take 1 Tab by mouth two (2) times a day.  180 Tab  3   ??? atorvastatin (LIPITOR) 20 mg tablet Take 1 Tab by mouth daily.  90 Tab  3   ??? insulin lispro (HUMALOG) 100 unit/mL injection On pump  18 Vial  3   ??? omeprazole (PRILOSEC) 20 mg capsule Take 1 Cap by mouth daily.  90 Cap  3   ??? lisinopril (PRINIVIL) 20 mg tablet Take 1 Tab by mouth daily.  90 Tab  3   ??? gabapentin (NEURONTIN) 300 mg capsule Take 1 Cap by mouth three (3) times daily.  90 Cap  12   ??? clonazePAM (KLONOPIN) 0.5 mg tablet Take 1 Tab by mouth two (2) times a day.  180 Tab  1   ??? aspirin 81 mg tablet Take 81 mg by mouth.         ??? ascorbic acid (VITAMIN C) 1,000 mg tablet Take  by mouth.         ???  timolol (TIMOPTIC-XR) 0.5 % ophthalmic gel-forming Administer 1 Drop to both eyes daily.         ??? multivitamins-minerals-lutein (CENTRUM SILVER) Tab Take  by mouth.         ??? vitamin e (E GEMS) 1,000 unit capsule Take 1,000 Units by mouth daily.         ??? Cholecalciferol, Vitamin D3, 5,000 unit Tab Take  by mouth.         ??? omega-3 fatty acids-vitamin e (FISH OIL) 1,000 mg cap Take 1 Cap by mouth.         ??? coenzyme q10 10 mg cap Take  by mouth.         ??? Alpha Lipoic Acid 200 mg Tab Take  by mouth.           No current facility-administered medications on file prior to visit.        No Known Allergies    Objective:  BP 111/91   Pulse 98   Temp(Src) 98.3 ??F (36.8 ??C) (Oral)   Resp 21   Ht 5\' 8"  (1.727 m)   Wt 175 lb 6.4 oz (79.561 kg)   BMI 26.68 kg/m2   SpO2 97% Body mass index is 26.68 kg/(m^2).  Constitutional: Well developed, nourished, no distress, alert, obese habitus   Eyes: Conjunctiva normal. PERRL.    CV: S1, S2.  RRR.  No murmurs/rubs.  No thrills palpated.  No carotid bruits.  Intact distal pulses.  No edema.   Pulm: No abnormalities on  inspection.  Clear to auscultation bilaterally. No wheezing/rhonchi.  Normal effort.     GI: Soft, nontender, nondistended.  Normal active bowel sounds. No CVA tenderness.   MS: Gait normal. Normal ROM all extremities.     Neuro: A/O x 3. No focal motor or sensory deficits. Speech normal.   Psych: Appropriate affect, judgement and insight.  Short-term memory intact.       Repeat bs as tested by pt was 88    Urine dipstick shows negative for all components.      Assessment/Plan:      Daniel was seen today for follow-up.    Diagnoses and associated orders for this visit:    Benign hypertension  -cont current regimen.    Dyslipidemia  -cont statin and managed by endo    DM (diabetes mellitus)  -managed by endo    Left flank pain  - AMB POC URINALYSIS DIP STICK AUTO W/O MICRO (negagtive)  - monitor      Health Maintenance:   Health Maintenance   Topic Date Due   ??? Hemoglobin A1c Q85m  09/04/2012   ??? Microalbumin Q1  03/04/2013   ??? Eye Exam Dilated Q1  04/04/2013   ??? Influenza Vaccine  04/16/2013   ??? Foot Exam Q1  05/23/2013   ??? Lipid Panel Q1  05/23/2013   ??? Colon Cancer Scrn Colonoscopy  04/04/2016

## 2012-09-15 NOTE — Progress Notes (Signed)
Maurice [name] is a 57 y.o. male here today for follow up visit. Patient states he doesn't feel well today.  LA done.

## 2012-10-03 DIAGNOSIS — E11319 Type 2 diabetes mellitus with unspecified diabetic retinopathy without macular edema: Secondary | ICD-10-CM | POA: Insufficient documentation

## 2012-10-19 ENCOUNTER — Encounter

## 2012-10-19 NOTE — Telephone Encounter (Signed)
Message copied by Leilani Able on Thu Oct 19, 2012  9:03 AM  ------       Message from: Luanne Bras       Created: Wed Oct 18, 2012  7:27 PM       Regarding: RE: Non-Urgent Medical Question       Contact: [phone]         Tonight my BP was 145/92. Like you pointed out, my BP has always been just fine with my current medications. I do not agree with the hospital doctor that the incident was in any way influenced by clonidine. I choose to resume the clonidine as I was taking it.              Speaking of clonidine, there are no refills remaining on the prescription from my previous PCP, Dr. Caralyn Guile (RX # [phone]). I cannot find a prescription on the [facility] web page for clonidine .1MG  twice a day for 90 days under your name. There is a 30 day one, but I need a 90 day.              Would you please enter a prescription for clonidine .1MG  twice a day for 90 days at the [facility] at [address] [place], [place], [phone]?              thanks,              Otniel Hoe: [phone]       C: [phone]              ----- Message -----       From: Kerman Passey, MD       Sent: 10/18/2012 11:07 AM EST       To: Clydie Braun       Subject: RE: Non-Urgent Medical Question              I did see the report.  Certainly, clonidine has been known to cause rebound hypertension (or a spike in blood pressure after stopping).  I usually have patients taper off, rather than stopping cold [place].  If you want, you can take 1/2 tab twice daily x 1 week, then 1/2 tab daily x 1 week and then stop.  If your bp is still high after being off of clonidine for 1-2 weeks, we can increase your dose of lisinopril.       If your toe-twitching gets worse off the clonidine, we can discuss playing around with dosing of lisinopril (decreasing the dose) to possibly accommodate the clonidine.  Your blood pressure on these meds has been fine in the past.       Hope this answers your questions,       Dr. Melina Schools               ----- Message -----          From: Clydie Braun          Sent: 10/17/2012  5:37 PM EST            To: Kerman Passey, MD       Subject: Non-Urgent Medical Question              I spent the weekend in [facility] after passing out from low BGs. You should have received a report from them already. Dr Trixie Dredge instructed me to stop clonidine and half lisinopril due to very low  BP in the hospital. Reluctantly stopped clonidine Sunday evening, but did not change the lisinopril. BP Tuesday evening was 150/84. That's too high. Is this a clonidine spike from stopping? I'm in favor of resuming the clonidine. BPs are usually OK. Cannot explain lows in the hospital. Clonidine was prescribed to assist in treatment of nervous toe twitching. Need your direction. I will continue to monitor my BP.              Thanks,              Dailyn Kempner: [phone]       C: [phone]  ------

## 2012-12-04 ENCOUNTER — Encounter

## 2012-12-04 NOTE — Telephone Encounter (Signed)
Message copied by Leilani Able on Mon Dec 04, 2012  7:49 AM  ------       Message from: Luanne Bras       Created: Sat Dec 02, 2012  5:11 PM       Regarding: Prescription Question       Contact: [phone]         Hi Dr. Melina Schools,              [facility] you and your baby are doing well.              According to my calendar it is that time again when I need for you to send in a prescription for Clonazepam 0.5 mg tablet taken twice a day (Qty 180).              The prescription is for 90 days.              My last refill was 09/10/2012.              The Pharmacy is [facility], [address] [place], [place]. [phone].              Thanks,              Maurice [name]: [phone]       C: [phone]  ------

## 2013-03-02 NOTE — Progress Notes (Signed)
Maurice [name] is a 57 y.o. male  Pt here today for follow up visit.

## 2013-03-02 NOTE — Patient Instructions (Signed)
Muscle Cramps: After Your Visit  Your Care Instructions  A muscle cramp is when a muscle tightens up suddenly. It often occurs in the legs. A muscle cramp is also called a charley horse.  Muscle cramps usually last less than a minute. However, the pain may last for several minutes. Leg cramps that occur at night may wake you up.  Heavy exercise, dehydration, and being overweight can increase your risk of getting cramps. An imbalance of certain chemicals in your blood, called electrolytes, can also lead to muscle cramps. Pregnant women sometimes get muscle cramps during sleep.  Muscle cramps can be treated by stretching and massaging the muscle. If cramps keep coming back, your doctor may prescribe medicine that relaxes your muscles.  Follow-up care is a key part of your treatment and safety. Be sure to make and go to all appointments, and call your doctor if you are having problems. It???s also a good idea to know your test results and keep a list of the medicines you take.  How can you care for yourself at home?  ?? Drink plenty of fluids to prevent dehydration, enough so that your urine is light yellow or clear like water. Choose water and other caffeine-free clear liquids until you feel better. If you have kidney, heart, or liver disease and have to limit fluids, talk with your doctor before you increase the amount of fluids you drink.  ?? Stretch your muscles every day, especially before and after exercise and at bedtime. Regular stretching can relax your muscles and may prevent cramps.  ?? Do not suddenly increase the amount of exercise you get. Increase your exercise a little each week.  ?? When you get a cramp, stretch and massage the muscle. You can also take a warm shower or bath to relax the muscle. A heating pad placed on the muscle can also help.  ?? Take a daily multivitamin supplement.  ?? Take an over-the-counter pain medicine, such as acetaminophen (Tylenol), ibuprofen (Advil, Motrin), or naproxen (Aleve)  for cramps. Read and follow all instructions on the label.  ?? Take your medicines exactly as prescribed. Call your doctor if you have any problems with your medicine.  When should you call for help?  Watch closely for changes in your health, and be sure to contact your doctor if:  ?? You get muscle cramps often that do not go away after home treatment.  ?? Your muscle cramps often wake you up at night.  ?? You do not get better as expected.   Where can you learn more?   Go to http://www.healthwise.net/[facility]  Enter I565 in the search box to learn more about "Muscle Cramps: After Your Visit."   ?? 2006-2014 [employer]. Care instructions adapted under license by [employer] (which disclaims liability or warranty for this information). This care instruction is for use with your licensed healthcare professional. If you have questions about a medical condition or this instruction, always ask your healthcare professional. Healthwise, Incorporated disclaims any warranty or liability for your use of this information.  Content Version: 10.1.311062; Current as of: October 25, 2012

## 2013-03-02 NOTE — Progress Notes (Signed)
Maurice [name] is a 57 y.o. 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  Patient reports medication compliance all of the time and diabetic diet compliance all of the time.  Managed by endo/dr. torres.  Last eye exam approximately 11/21/2012. Last a1c 7.4. Had some hypoglycemic events requiring hospitalization.    Hypertension: Patient reports taking medications as instructed.  No medication side effects noted.     HLD:  Has been compliant with meds and low-fat diet.  Denies myalgias or other side effects. Managed by endo.    Pt c/o several months of feeling out of breath.  Pt feels tired and that he has lost his "zip".  No orthopnea/dyspnea.  +more frequent headaches.  No smoking hx.  Gets winded with using push mower.  Does snore, occ morning HA.  No nocturia.    Also notes after he eats, he gets bloated and uncomfortable.  Worse in am.  No n/v or diarrhea/constipation.  More gas. occ fecal urgency.  +early satiety    Also notes bad cramps in legs.      ROS:  Constitutional: No recent weight change. No weakness/fatigue.  No f/c.   Skin: No rashes, change in nails/hair, itching   HENT: No HA, dizziness. No hearing loss/tinnitus.  No nasal congestion/discharge.   Eyes: No change in vision, double/blurred vision or eye pain/redness.    Cardiovascular: No CP/palpitations.  No DOE/orthopnea/PND.   Respiratory: No cough/sputum, dyspnea, wheezing.   Gastointestinal: No dysphagia, reflux.  No n/v.  No constipation/diarrhea.  No melena/rectal bleeding.   Genitourinary: No dysuria, urinary hesitancy, nocturia, hematuria.  No incontinence.   Musculoskeletal: No joint pain/stiffness.  No muscle pain/tenderness.   Endo: No heat/cold intolerance, no polyuria/polydypsia.   Heme: No h/o anemia.  No easy bleeding/bruising.   Allergy/Immunology: No seasonal rhinitis. Denies frequent colds, sinus/ear infections.   Neurological: No seizures/numbness/weakness.  +  paresthesias.   Psychiatric:  No depression, anxiety.     The problem list was updated as a part of today's visit.  Patient Active Problem List   Diagnosis Code   ??? Benign hypertension 401.1   ??? Dyslipidemia 272.4   ??? DM (diabetes mellitus) 250.00   ??? 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 Diabetic neuropathy 250.60, 357.2   ??? GERD (gastroesophageal reflux disease) 530.81   ??? History of nephrolithiasis V13.01   ??? Insulin pump in place V45.85   ??? Diabetic retinopathy 250.50, 362.01       The PSH, FH were reviewed.      SH:  History   Substance Use Topics   ??? Smoking status: Never Smoker    ??? Smokeless tobacco: Never Used   ??? Alcohol Use: 1.5 oz/week     3 Cans of beer per week       Medications/Allergies:  Current Outpatient Prescriptions on File Prior to Visit   Medication Sig Dispense Refill   ??? HUMALOG 100 unit/mL injection USE AS DIRECTED WITH PUMP  180 mL  12   ??? omeprazole (PRILOSEC) 40 mg capsule TAKE ONE CAPSULE BY MOUTH TWICE DAILY  180 Cap  0   ??? clonazePAM (KLONOPIN) 0.5 mg tablet Take 1 Tab by mouth two (2) times a day.  180 Tab  1   ??? atorvastatin (LIPITOR) 20 mg tablet Take 1 Tab by mouth daily.  90 Tab  3   ??? lisinopril (PRINIVIL) 20 mg tablet Take 1  Tab by mouth daily.  90 Tab  3   ??? gabapentin (NEURONTIN) 300 mg capsule Take 1 Cap by mouth three (3) times daily.  90 Cap  12   ??? aspirin 81 mg tablet Take 81 mg by mouth.         ??? ascorbic acid (VITAMIN C) 1,000 mg tablet Take  by mouth.         ??? timolol (TIMOPTIC-XR) 0.5 % ophthalmic gel-forming Administer 1 Drop to both eyes daily.         ??? multivitamins-minerals-lutein (CENTRUM SILVER) Tab Take  by mouth.         ??? vitamin e (E GEMS) 1,000 unit capsule Take 1,000 Units by mouth daily.         ??? Cholecalciferol, Vitamin D3, 5,000 unit Tab Take  by mouth.         ??? omega-3 fatty acids-vitamin e (FISH OIL) 1,000 mg cap Take 1 Cap by mouth.         ??? coenzyme q10 10 mg cap Take  by mouth.         ??? Alpha Lipoic Acid 200 mg Tab Take  by  mouth.           No current facility-administered medications on file prior to visit.      No Known Allergies    Objective:  BP 106/68   Pulse 74   Temp(Src) 97.7 ??F (36.5 ??C) (Oral)   Ht 5\' 8"  (1.727 m)   Wt 180 lb (81.647 kg)   BMI 27.38 kg/m2   SpO2 97% Body mass index is 27.38 kg/(m^2).  Gen: Well developed, nourished, no distress, alert   Eyes: Conjunctiva normal. PERRL.    CV: S1, S2.  RRR.  No murmurs/rubs.  No thrills palpated.  No carotid bruits.  Intact distal pulses.  No edema.   Pulm: No abnormalities on inspection.  Clear to auscultation bilaterally. No wheezing/rhonchi.  Normal effort.     GI: Soft, nontender, nondistended.  Normal active bowel sounds. No  masses on palpation.  No hepatosplenomegaly.   MS: Gait normal.  Normal ROM all extremities.     Neuro: A/O x 3.  No focal motor or sensory deficits. Speech normal.    Psych: Appropriate affect, judgement and insight.  Short-term memory intact.          Assessment/Plan:    HTN: well controlled.  Cont current.    Diabetes MellitusII (w/ complications: neuropathy and retinopathy): well controlled.  Last diabetic eye exam: 11/2012    HLD: stable. On statin.  Managed by endo.    Early satiety - GES.    DOE - ck echo.  No smoking hx.    Fatigue - tsh was nml 05/2012.  Consider OSA study, tsh.    Health Maintenance   Topic Date Due   ??? Colonoscopy  04/05/2011   ??? Microalbumin Q1  03/04/2013   ??? Hemoglobin A1c Q55m  03/16/2013   ??? Eye Exam Dilated Q1  04/04/2013   ??? Foot Exam Q1  05/23/2013   ??? Lipid Panel Q1  05/23/2013         Orders Placed This Encounter   ??? NM GASTRIC EMPTY STDY     solid     Standing Status: Future      Number of Occurrences:       Standing Expiration Date: 04/02/2014     Scheduling Instructions:      @[facility]  or [facility]  Order Specific Question:  Reason for Exam     Answer:  DMII w/ neuropathy, early satiety, bloating   ??? REFERRAL TO GASTROENTEROLOGY     Referral Priority:  Routine     Referral Type:  Consultation     Referral Reason:  Specialty  Services Required   ??? 2D ECHO COMPLETE ADULT (TTE) W OR WO CONTR     Standing Status: Future      Number of Occurrences:       Standing Expiration Date: 08/30/2013     Order Specific Question:  Reason for Exam:     Answer:  dyspnea on exertion   ??? dorzolamide (TRUSOPT) 2 % ophthalmic solution     Sig: Administer 2 Drops to both eyes three (3) times daily.   ??? clonazePAM (KLONOPIN) 0.5 mg tablet     Sig: Take 1 Tab by mouth two (2) times a day.     Dispense:  180 Tab     Refill:  1

## 2013-03-07 NOTE — Telephone Encounter (Signed)
Perhaps he's talking about the microalbumin testing that is due for diabetes?  It's managed by endocrine/dr. Allene Dillon.  Otherwise, I dont' know what he's referring to.  Does he have a specific question?

## 2013-03-07 NOTE — Telephone Encounter (Signed)
Pt called with concern test had not been set up yet. I gave him [facility], and [facility] number. He is going to make apt, but he was in his My Chart and saw something about a kidney function test. I didn't see anything in his chart please advise.

## 2013-03-09 NOTE — Telephone Encounter (Signed)
Patient called states his symptoms has changed. The scheduler called him today but he would like to speak with the md before he schedule the test. Please call [phone] (M).

## 2013-03-09 NOTE — Telephone Encounter (Signed)
Left message at home(with wife) and on patient's mobile

## 2013-03-30 MED ADMIN — technetium sulfur colloid solution 1.1 milli Curie: INTRAVENOUS | @ 12:00:00 | NDC 99999001501

## 2013-04-13 LAB — AMB EXT HGBA1C: Hemoglobin A1c, External: 7.6

## 2013-05-09 ENCOUNTER — Encounter

## 2013-05-18 ENCOUNTER — Encounter

## 2013-05-18 NOTE — Telephone Encounter (Signed)
Message copied by Daine Gravel on Fri May 18, 2013  8:08 AM  ------       Message from: Luanne Bras       Created: Thu May 17, 2013  5:59 PM       Regarding: Prescription Question       Contact: [phone]         Dr. Melina Schools,              As we discussed at my last appointment, today is the reminder on my calendar to send you a request for a prescription for Clonazepam 0.5 MG Tablets, take 1 tablet twice daily. The current prescription (RX [id number]) at the [facility] at [address] [id number]) has no more refills. The last fill date was 02/25/2013.              Please continue with the 90 day refill (Qty 180).              Thank You,              Ignacia Felling  ------

## 2013-05-18 NOTE — Telephone Encounter (Signed)
Prescription(s) called in to pharmacy.

## 2013-05-18 NOTE — Telephone Encounter (Signed)
Please call in the clonazepam as ordered to pharmacy on file.

## 2013-06-13 NOTE — Telephone Encounter (Signed)
Patient left message that he tried to make his colonoscopy appointment but was told our office has to do it. Please contact Dr. Federico Flake' office to set the appointment. Please call patient.

## 2013-06-18 NOTE — Telephone Encounter (Signed)
Apt scheduled pt notified.

## 2013-06-22 ENCOUNTER — Telehealth

## 2013-06-22 NOTE — Progress Notes (Signed)
Maurice [name] is a 57 y.o. male  Pt here today for follow up visit.    1. Have you been to the ER, urgent care clinic since your last visit?  Hospitalized since your last visit?No    2. Have you seen or consulted any other health care providers outside of the [facility] since your last visit?  Include any pap smears or colon screening. Yes Where: Dr. Allene Dillon, Dr. Berton Baxter, Dr. Jackalyn Lombard

## 2013-06-22 NOTE — Addendum Note (Signed)
Addended byDaine Gravel on: 06/22/2013 03:12 PM     Modules accepted: Orders

## 2013-06-22 NOTE — Telephone Encounter (Signed)
The eye doctorDr. Neomia Dear would like for you to order MRI Brain and orbits with and without contrast.

## 2013-06-22 NOTE — Patient Instructions (Signed)
Gas and Bloating: After Your Visit  Your Care Instructions  Gas and bloating can be uncomfortable and embarrassing problems. All people pass gas, but some people produce more gas than others, sometimes enough to cause distress. It is normal to pass gas from 6 to 20 times per day. Excess gas usually is not caused by a serious health problem.  Gas and bloating usually are caused by something you eat or drink, including some food supplements and medicines.  Gas and bloating are usually harmless and go away without treatment. However, changing your diet can help end the problem. Some over-the-counter medicines can help prevent gas and relieve bloating.  Follow-up care is a key part of your treatment and safety. Be sure to make and go to all appointments, and call your doctor if you are having problems. It???s also a good idea to know your test results and keep a list of the medicines you take.  How can you care for yourself at home?  ?? Keep a food diary if you think a food gives you gas. Write down what you eat or drink. Also record when you get gas. If you notice that a food seems to cause your gas each time, avoid it and see if the gas goes away. Examples of foods that cause gas include:  ?? Fried and fatty foods.  ?? Beans.  ?? Vegetables such as artichokes, asparagus, broccoli, brussels sprouts, cabbage, cauliflower, cucumbers, green peppers, onions, peas, radishes, and raw potatoes.  ?? Fruits such as apricots, bananas, melons, peaches, pears, prunes, and raw apples.  ?? Wheat and wheat bran.  ?? Soak dry beans in water overnight, then dump the water and cook the soaked beans in new water. This can help prevent gas and bloating.  ?? If you have problems with lactose, avoid dairy products such as milk and cheese.  ?? Try not to swallow air. Do not drink through a straw, gulp your food, or chew gum.  ?? Take an over-the-counter medicine. Read and follow all instructions on the label.  ?? Food enzymes, such as Beano, can be added  to gas-producing foods to prevent gas.  ?? Antacids, such as Maalox Anti-Gas and Mylanta Gas, can relieve bloating by making you burp.  ?? Activated charcoal tablets, such as CharcoCaps, may decrease odor from gas you pass.  ?? If you have problems with lactose, you can take medicines such as Dairy Ease and Lactaid with dairy products to prevent gas and bloating.  ?? Get some exercise regularly.  When should you call for help?  Call 911 anytime you think you may need emergency care. For example, call if:  ?? You have gas and signs of a heart attack, such as:  ?? Chest pain or pressure.  ?? Sweating.  ?? Shortness of breath.  ?? Nausea or vomiting.  ?? Pain that spreads from the chest to the neck, jaw, or one or both shoulders or arms.  ?? Dizziness or lightheadedness.  ?? A fast or uneven pulse.  After calling 911, chew 1 adult-strength aspirin. Wait for an ambulance. Do not try to drive yourself.  Call your doctor now or seek immediate medical care if:  ?? You have severe belly pain.  ?? You have blood in your stool.  Watch closely for changes in your health, and be sure to contact your doctor if:  ?? You have blood or pus in your urine.  ?? Your urine is cloudy or smells bad.  ?? You are   burping and have trouble swallowing.  ?? You feel bloated and have swelling in your belly.  ?? You do not get better as expected.   Where can you learn more?   Go to http://www.healthwise.net/[facility]  Enter W582 in the search box to learn more about "Gas and Bloating: After Your Visit."   ?? 2006-2014 Healthwise, Incorporated. Care instructions adapted under license by [place] (which disclaims liability or warranty for this information). This care instruction is for use with your licensed healthcare professional. If you have questions about a medical condition or this instruction, always ask your healthcare professional. Healthwise, Incorporated disclaims any warranty or liability for your use of this information.  Content Version:  10.2.346038; Current as of: January 17, 2013

## 2013-06-22 NOTE — Progress Notes (Signed)
Maurice [name] is a 57 y.o. male and presents with Follow-up     Subjective:  DOE - echo normal.  Sx have resolved.  Attributes to stress.  Had daughter's wedding and extended family issues.    DM - on pump.  Last eye exam 11/2012.  Settings changed and having less hypoglycemia.  Has appt with dr. Allene [name].      HTN - controlled.  Needs refill on lisinopril.    Pt has colo upcoming this month.  Pt c/o flatulence.  Has associated discomfort.  Notes stools are vary narrow and thin.  Tried to increase fiber, but had worse gas.  Tried this for several weeks.    Also notes diplopia.  States it resolves with closing one eye.  Has seen retinal and corneal specialist which state it isn't the eye.  States it's getting worse.  Was told it was "visual discrepancy" between the two eyes.  Also notes h/o TIA.    ROS:  Constitutional: No recent weight change. No weakness/fatigue.  No f/c.   HENT: No HA, +dizziness. No hearing loss/tinnitus.  No nasal congestion/discharge.   Eyes: + change in vision,+ double/blurred vision , no eye pain/redness.    Cardiovascular: No CP/palpitations.  No DOE/orthopnea/PND.   Respiratory: No cough/sputum, dyspnea, wheezing.   Gastointestinal: No dysphagia, reflux.  No n/v.  No constipation/diarrhea.  No melena/rectal bleeding.   Genitourinary: No dysuria, urinary hesitancy, nocturia, hematuria.  No incontinence.   Musculoskeletal: No joint pain/stiffness.  No muscle pain/tenderness.   Psychiatric:  No depression, anxiety.     The problem list was updated as a part of today's visit.  Patient Active Problem List   Diagnosis Code   ??? Benign hypertension 401.1   ??? Dyslipidemia 272.4   ??? DM (diabetes mellitus) 250.00   ??? 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 Diabetic neuropathy 250.60, 357.2   ??? GERD (gastroesophageal reflux disease) 530.81   ??? History of nephrolithiasis V13.01   ??? Insulin pump in place V45.85   ??? Diabetic retinopathy 250.50, 362.01   ??? DOE (dyspnea on exertion) 786.09        The PSH, FH were reviewed.      SH:  History   Substance Use Topics   ??? Smoking status: Never Smoker    ??? Smokeless tobacco: Never Used   ??? Alcohol Use: 1.5 oz/week     3 Cans of beer per week         Medications/Allergies:  Current Outpatient Prescriptions on File Prior to Visit   Medication Sig Dispense Refill   ??? clonazepam (KLONOPIN) 0.5 mg tablet Take 1 tablet by mouth two (2) times a day.  180 tablet  1   ??? gabapentin (NEURONTIN) 300 mg capsule TAKE ONE CAPSULE BY MOUTH THREE TIMES DAILY  90 capsule  3   ??? omeprazole (PRILOSEC) 20 mg capsule TAKE 1 CAPSULE BY MOUTH EVERY DAY  90 Cap  0   ??? omeprazole (PRILOSEC) 40 mg capsule TAKE ONE CAPSULE BY MOUTH TWICE DAILY  180 Cap  0   ??? dorzolamide (TRUSOPT) 2 % ophthalmic solution Administer 2 Drops to both eyes three (3) times daily.       ??? HUMALOG 100 unit/mL injection USE AS DIRECTED WITH PUMP  180 mL  12   ??? atorvastatin (LIPITOR) 20 mg tablet Take 1 Tab by mouth daily.  90 Tab  3   ??? lisinopril (PRINIVIL) 20 mg tablet Take 1 Tab by mouth daily.  90 Tab  3   ??? aspirin 81 mg tablet Take 81 mg by mouth.         ??? ascorbic acid (VITAMIN C) 1,000 mg tablet Take  by mouth.         ??? timolol (TIMOPTIC-XR) 0.5 % ophthalmic gel-forming Administer 1 Drop to both eyes daily.         ??? multivitamins-minerals-lutein (CENTRUM SILVER) Tab Take  by mouth.         ??? vitamin e (E GEMS) 1,000 unit capsule Take 1,000 Units by mouth daily.         ??? Cholecalciferol, Vitamin D3, 5,000 unit Tab Take  by mouth.         ??? omega-3 fatty acids-vitamin e (FISH OIL) 1,000 mg cap Take 1 Cap by mouth.         ??? coenzyme q10 10 mg cap Take  by mouth.         ??? Alpha Lipoic Acid 200 mg Tab Take  by mouth.           No current facility-administered medications on file prior to visit.        No Known Allergies    Objective:  BP 120/70   Pulse 75   Temp(Src) 98 ??F (36.7 ??C) (Oral)   Resp 16   Ht 5\' 8"  (1.727 m)   Wt 179 lb (81.194 kg)   BMI 27.22 kg/m2   SpO2 97%   Constitutional: Well developed,  nourished, no distress, alert,   Eyes: Conjunctiva normal. PERRL.    CV: S1, S2.  RRR.  No murmurs/rubs.  No thrills palpated.  No carotid bruits.  Intact distal pulses.  No edema.   Pulm: No abnormalities on inspection.  Clear to auscultation bilaterally. No wheezing/rhonchi.  Normal effort.     GI: Soft, nontender, nondistended.  Normal active bowel sounds.    Neuro: A/O x 3. No focal motor or sensory deficits. Speech normal.   Skin: No lesions/rashes on inspection.     Psych: Appropriate affect, judgement and insight.  Short-term memory intact.       Assessment/Plan:      Cihlar was seen today for follow-up.    Diagnoses and associated orders for this visit:    Benign hypertension  - lisinopril (PRINIVIL) 20 mg tablet; Take 1 tablet by mouth daily. Refilled.    DM (diabetes mellitus)  -managed by endo.  - get labs from endo    Flatulence  - trial of probiotics.  F/u with GI.    Diplopia and h/o TIA (transient ischemic attack)  - MRI BRAIN W WO CONT; Future    Health Maintenance: 10/24 colo scheduled  Health Maintenance   Topic Date Due   ??? Tdap Age > 18  09/25/1974   ??? Td Q 10 Yrs Age > 18  09/25/1974   ??? Colonoscopy  04/05/2011   ??? Hemoglobin A1c Q80m  03/16/2013   ??? Influenza Age 48 To Adult  03/16/2013   ??? Foot Exam Q1  05/23/2013   ??? Lipid Panel Q1  05/23/2013   ??? Eye Exam Dilated Q1  11/14/2013   ??? Microalbumin Q1  03/07/2014

## 2013-06-22 NOTE — Telephone Encounter (Signed)
Ordered, please cancel MRI brain ordered earlier today

## 2013-06-30 LAB — CREATININE, POC
Creatinine, POC: 1.1 MG/DL (ref 0.6–1.3)
GFRAA, POC: >60 mL/min/{1.73_m2} (ref >60)
GFRNA, POC: >60 mL/min/{1.73_m2} (ref >60)

## 2013-06-30 MED ADMIN — gadobutrol (Gadavist) contrast solution 8 mL: INTRAVENOUS | @ 14:00:00 | NDC 50419032524

## 2013-07-04 ENCOUNTER — Encounter

## 2013-07-04 NOTE — Progress Notes (Signed)
Quick Note:    Spoke with pt. Discussed need for plavix vs ASA for stroke prevention.  ______

## 2013-07-05 ENCOUNTER — Encounter

## 2013-07-10 NOTE — Patient Instructions (Addendum)
Upper Respiratory Infection (Cold): After Your Visit  Your Care Instructions     An upper respiratory infection, or URI, is an infection of the nose, sinuses, or throat. URIs are spread by coughs, sneezes, and direct contact. The common cold is the most frequent kind of URI. The flu and sinus infections are other kinds of URIs.  Almost all URIs are caused by viruses. Antibiotics won't cure them. But you can treat most infections with home care. This may include drinking lots of fluids and taking over-the-counter pain medicine. You will probably feel better in 4 to 10 days.  The doctor has checked you carefully, but problems can develop later. If you notice any problems or new symptoms, get medical treatment right away.  Follow-up care is a key part of your treatment and safety. Be sure to make and go to all appointments, and call your doctor if you are having problems. It's also a good idea to know your test results and keep a list of the medicines you take.  How can you care for yourself at home?  ?? To prevent dehydration, drink plenty of fluids, enough so that your urine is light yellow or clear like water. Choose water and other caffeine-free clear liquids until you feel better. If you have kidney, heart, or liver disease and have to limit fluids, talk with your doctor before you increase the amount of fluids you drink.  ?? Take an over-the-counter pain medicine, such as acetaminophen (Tylenol), ibuprofen (Advil, Motrin), or naproxen (Aleve). Read and follow all instructions on the label.  ?? If your doctor prescribed antibiotics, take them as directed. Do not stop taking them just because you feel better. You need to take the full course of antibiotics.  ?? Before you use cough and cold medicines, check the label. These medicines may not be safe for young children or for people with certain health problems.  ?? Be careful when taking over-the-counter cold or flu medicines and Tylenol at the same time. Many of  these medicines have acetaminophen, which is Tylenol. Read the labels to make sure that you are not taking more than the recommended dose. Too much acetaminophen (Tylenol) can be harmful.  ?? Get plenty of rest.  ?? Do not smoke or allow others to smoke around you. If you need help quitting, talk to your doctor about stop-smoking programs and medicines. These can increase your chances of quitting for good.  When should you call for help?  Call 911 anytime you think you may need emergency care. For example, call if:  ?? You have severe trouble breathing.  Call your doctor now or seek immediate medical care if:  ?? You seem to be getting much sicker.  ?? You have new or worse trouble breathing.  ?? You have a new or higher fever.  ?? You have a new rash.  Watch closely for changes in your health, and be sure to contact your doctor if:  ?? You have a new symptom, such as a sore throat, an earache, or sinus pain.  ?? You cough more deeply or more often, especially if you notice more mucus or a change in the color of your mucus.  ?? You do not get better as expected.   Where can you learn more?   Go to http://www.healthwise.net/[facility]  Enter K520 in the search box to learn more about "Upper Respiratory Infection (Cold): After Your Visit."   ?? 2006-2014 Healthwise, Incorporated. Care instructions adapted under license by [place]   [place] (which disclaims liability or warranty for this information). This care instruction is for use with your licensed healthcare professional. If you have questions about a medical condition or this instruction, always ask your healthcare professional. Healthwise, Incorporated disclaims any warranty or liability for your use of this information.  Content Version: 10.2.346038; Current as of: January 17, 2013

## 2013-07-10 NOTE — Progress Notes (Signed)
Maurice [name] is a 57 y.o. male here today for cold/cough.

## 2013-07-10 NOTE — Progress Notes (Signed)
Chief Complaint   Patient presents with   ??? Cold Symptoms   ??? Cough       SUBJECTIVE:   Maurice [name] is a 57 y.o. male who complains of 2 day h/o bilat ear pain, sore throat, sneezing, productive cough.    Review of Systems - ENT ROS: positive for - nasal congestion, nasal discharge, sneezing and sore throat  negative for - sinus pain  Respiratory ROS: + cough, no shortness of breath, or wheezing  Cardiovascular ROS: no chest pain or dyspnea on exertion  Gastrointestinal ROS: no abdominal pain, change in bowel habits, or black or bloody stools  Musculoskeletal ROS: negative  Neurological ROS: negative    Physical Examination:   BP 120/70   Pulse 72   Temp(Src) 98.7 ??F (37.1 ??C) (Oral)   Resp 16   Ht 5\' 8"  (1.727 m)   Wt 180 lb (81.647 kg)   BMI 27.38 kg/m2   SpO2 97%    Constitutional: Well developed, nourished, no distress, alert   HENT: Exterior ears and tympanic membranes normal bilaterally. Supple neck. No thyromegaly or lymphadenopathy. Erythematous Oropharynx  and moist mucous membranes.     Eyes: Conjunctiva normal. PERRL.    CV: S1, S2.  RRR.  No murmurs/rubs.  No thrills palpated.  No carotid bruits.  Intact distal pulses.  No edema. +cough   Pulm: No abnormalities on inspection.  Clear to auscultation bilaterally. No wheezing/rhonchi.  Normal effort.         Assessment/Plan  URI - discussed that it's likely viral.  Given we are headed into holidays, will give zpack to fill if not feeling better in 3-4 days or for worsening sx.    REactive airway - albuterol prn    Orders Placed This Encounter   ??? azithromycin (ZITHROMAX) 250 mg tablet     Sig: Take 2 tablets today, then take 1 tablet daily     Dispense:  6 tablet     Refill:  0   ??? benzonatate (TESSALON) 200 mg capsule     Sig: Take 1 capsule by mouth three (3) times daily as needed for Cough for up to 7 days.     Dispense:  60 capsule     Refill:  0   ??? albuterol (PROVENTIL HFA, VENTOLIN HFA, PROAIR HFA) 90 mcg/actuation inhaler     Sig: Take 2 puffs by  inhalation every four (4) hours as needed for Shortness of Breath.     Dispense:  1 Inhaler     Refill:  0           Kerman Passey, MD

## 2013-07-16 LAB — AMB EXT HGBA1C: Hemoglobin A1c, External: 7.3

## 2013-07-27 NOTE — Progress Notes (Signed)
Maurice [name] is a 57 y.o. male  Pt here today for physical visit.    1. Have you been to the ER, urgent care clinic since your last visit?  Hospitalized since your last visit?No    2. Have you seen or consulted any other health care providers outside of the [facility] since your last visit?  Include any pap smears or colon screening. No

## 2013-07-27 NOTE — Progress Notes (Signed)
Maurice [name] is a 57 y.o. male and presents with Physical       Subjective:    Pt just feels "unwell".  Lacks energy.  Admits to lack of interest and lack of appetite.  Is stressed out about double vision issue (worsening, w/o definite diagnosis).      ROS:  Constitutional: No recent weight change. No weakness/fatigue.  No f/c.   HENT: + HA, dizziness. No hearing loss/tinnitus.  No nasal congestion/discharge.   Eyes: No change in vision, +double/blurred vision , no eye pain/redness.    Cardiovascular: No CP/palpitations.  No DOE/orthopnea/PND.   Respiratory: No cough/sputum, dyspnea, wheezing.   Gastointestinal: No dysphagia, +reflux.  + nausea.  No constipation/diarrhea.  No melena/rectal bleeding.   Neurological: No seizures/numbness/weakness.  No paresthesias.   Psychiatric:  + depression, no anxiety.     The problem list was updated as a part of today's visit.  Patient Active Problem List   Diagnosis Code   ??? Benign hypertension 401.1   ??? Dyslipidemia 272.4   ??? DM (diabetes mellitus) 250.00   ??? 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 Diabetic neuropathy 250.60, 357.2   ??? GERD (gastroesophageal reflux disease) 530.81   ??? History of nephrolithiasis V13.01   ??? Insulin pump in place V45.85   ??? Diabetic retinopathy 250.50, 362.01   ??? Diplopia 368.2   ??? 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 Encephalomalacia on imaging study 348.89   ??? History of stroke V12.54       The PSH, FH were reviewed.      SH:  History   Substance Use Topics   ??? Smoking status: Never Smoker    ??? Smokeless tobacco: Never Used   ??? Alcohol Use: 1.5 oz/week     3 Cans of beer per week         Medications/Allergies:  Current Outpatient Prescriptions on File Prior to Visit   Medication Sig Dispense Refill   ??? albuterol (PROVENTIL HFA, VENTOLIN HFA, PROAIR HFA) 90 mcg/actuation inhaler Take 2 puffs by inhalation every four (4) hours as needed for Shortness of Breath.  1 Inhaler  0   ??? clopidogrel  (PLAVIX) 75 mg tablet Take 1 tablet by mouth daily.  90 tablet  3   ??? lisinopril (PRINIVIL) 20 mg tablet Take 1 tablet by mouth daily.  90 tablet  3   ??? clonazepam (KLONOPIN) 0.5 mg tablet Take 1 tablet by mouth two (2) times a day.  180 tablet  1   ??? gabapentin (NEURONTIN) 300 mg capsule TAKE ONE CAPSULE BY MOUTH THREE TIMES DAILY  90 capsule  3   ??? omeprazole (PRILOSEC) 40 mg capsule TAKE ONE CAPSULE BY MOUTH TWICE DAILY  180 Cap  0   ??? dorzolamide (TRUSOPT) 2 % ophthalmic solution Administer 2 Drops to both eyes three (3) times daily.       ??? HUMALOG 100 unit/mL injection USE AS DIRECTED WITH PUMP  180 mL  12   ??? atorvastatin (LIPITOR) 20 mg tablet Take 1 Tab by mouth daily.  90 Tab  3   ??? ascorbic acid (VITAMIN C) 1,000 mg tablet Take  by mouth.         ??? timolol (TIMOPTIC-XR) 0.5 % ophthalmic gel-forming Administer 1 Drop to both eyes daily.         ??? multivitamins-minerals-lutein (CENTRUM SILVER) Tab Take  by mouth.         ???  vitamin e (E GEMS) 1,000 unit capsule Take 1,000 Units by mouth daily.         ??? Cholecalciferol, Vitamin D3, 5,000 unit Tab Take  by mouth.         ??? omega-3 fatty acids-vitamin e (FISH OIL) 1,000 mg cap Take 1 Cap by mouth.         ??? coenzyme q10 10 mg cap Take  by mouth.         ??? Alpha Lipoic Acid 200 mg Tab Take  by mouth.           No current facility-administered medications on file prior to visit.          No Known Allergies    Objective:  BP 100/60   Pulse 69   Temp(Src) 98.2 ??F (36.8 ??C) (Oral)   Resp 16   Ht 5\' 8"  (1.727 m)   Wt 176 lb 6.4 oz (80.015 kg)   BMI 26.83 kg/m2   SpO2 97%   Constitutional: Well developed, nourished, no distress, alert   HENT: Exterior ears and tympanic membranes normal bilaterally. Supple neck. No thyromegaly or lymphadenopathy. Oropharynx clear and moist mucous membranes.     Eyes: Conjunctiva normal. PERRL.    CV: S1, S2.  RRR.  No murmurs/rubs.  No thrills palpated.  No carotid bruits.  Intact distal pulses.  No edema.   Pulm: No abnormalities on  inspection.  Clear to auscultation bilaterally. No wheezing/rhonchi.  Normal effort.     GI: Soft, nontender, nondistended.  Normal active bowel sounds.    MS: Gait normal.  Normal ROM all extremities.     Neuro: A/O x 3. No focal motor or sensory deficits. Speech normal.   Skin: No lesions/rashes on inspection.     Psych: Appropriate affect, judgement and insight.  Short-term memory intact.     Sensory exam of the foot is normal, tested with the monofilament. Good pulses, no lesions or ulcers, good peripheral pulses.      Assessment/Plan:      Bourque was seen today for physical.    Diagnoses and associated orders for this visit:    Dyslipidemia  - LIPID PANEL    Benign hypertension  -cont current regimen    Reactive depression (situational)  -all antidepressants are interacting with plavix.  - recommended counseling and incr exercise    Diabetes -managed by Dr. Orlin Hilding  - [facility] DIABETES FOOT EXAM      Health Maintenance:   Health Maintenance   Topic Date Due   ??? Tdap Age > 18  09/25/1974   ??? Td Q 10 Yrs Age > 18  09/25/1974   ??? Eye Exam Dilated Q1  11/14/2013   ??? Hemoglobin A1c Q8m  12/07/2013   ??? Microalbumin Q1  03/07/2014   ??? Influenza Age 42 To Adult  03/16/2014   ??? Foot Exam Q1  07/27/2014   ??? Lipid Panel Q1  07/27/2014   ??? Colonoscopy  07/09/2018

## 2013-08-14 NOTE — Telephone Encounter (Signed)
Endocrine to write order

## 2013-08-14 NOTE — Telephone Encounter (Signed)
Pharmacy is calling requesting directions for Humalog pump. Please call before claim is denied. [facility] [phone].

## 2013-09-16 LAB — AMB EXT URINE MICROALBUMIN: Urine Microalbumin, External: <3

## 2013-09-16 LAB — AMB EXT CREATININE: Creatinine, External: 18.7

## 2013-12-04 ENCOUNTER — Encounter

## 2013-12-04 MED ORDER — CLONAZEPAM 0.5 MG TAB
0.5 mg | ORAL_TABLET | Freq: Two times a day (BID) | ORAL | Status: DC
Start: 2013-12-04 — End: 2013-12-21

## 2013-12-04 NOTE — Telephone Encounter (Signed)
Prescription(s) called in to pharmacy.

## 2013-12-04 NOTE — Telephone Encounter (Signed)
Please call in klonopin 0.5mg  po bid #180, 0refills.

## 2013-12-21 MED ORDER — GLUCAGON (HUMAN RECOMBINANT) 1 MG INJECTION KIT
1 mg | PACK | INTRAMUSCULAR | Status: DC | PRN
Start: 2013-12-21 — End: 2015-03-11

## 2013-12-21 MED ORDER — CLONAZEPAM 0.5 MG TAB
0.5 mg | ORAL_TABLET | Freq: Two times a day (BID) | ORAL | Status: DC
Start: 2013-12-21 — End: 2014-03-04

## 2013-12-21 MED ORDER — LISINOPRIL 20 MG TAB
20 mg | ORAL_TABLET | Freq: Two times a day (BID) | ORAL | Status: DC
Start: 2013-12-21 — End: 2014-03-18

## 2013-12-21 NOTE — Patient Instructions (Signed)
Learning About High Blood Pressure  What is high blood pressure?     Blood pressure is a measure of how hard the blood pushes against the walls of your arteries. It's normal for blood pressure to go up and down throughout the day, but if it stays up, you have high blood pressure. Another name for high blood pressure is hypertension.  Two numbers tell you your blood pressure. The first number is the systolic pressure. It shows how hard the blood pushes when your heart is pumping. The second number is the diastolic pressure. It shows how hard the blood pushes between heartbeats, when your heart is relaxed and filling with blood.  A blood pressure of less than 120/80 (say "120 over 80") is ideal for an adult. High blood pressure is 140/90 or higher. Many people fall into the category in between, called prehypertension. People with prehypertension need to make lifestyle changes to bring their blood pressure down and help prevent or delay high blood pressure.  What happens when you have high blood pressure?  ?? Blood flows through your arteries with too much force. Over time, this damages the walls of your arteries. But you can't feel it. High blood pressure usually doesn't cause symptoms.  ?? Fat and calcium start to build up in your arteries. This buildup is called plaque. Plaque makes your arteries narrower and stiffer. Blood can't flow through them as easily.  ?? This lack of good blood flow starts to damage some of the organs in your body. This can lead to problems such as coronary artery disease and heart attack, heart failure, stroke, kidney failure, and eye damage.  How can you prevent high blood pressure?  ?? Stay at a healthy weight.  ?? Try to limit how much sodium you eat to less than 2,300 milligrams (mg) a day. And try to limit the sodium you eat to less than 1,500 mg a day if you are 51 or older, are black, or have high blood pressure, diabetes, or chronic kidney disease.  ?? Buy foods that are labeled  "unsalted," "sodium-free," or "low-sodium." Foods labeled "reduced-sodium" and "light sodium" may still have too much sodium.  ?? Flavor your food with garlic, lemon juice, onion, vinegar, herbs, and spices instead of salt. Do not use soy sauce, steak sauce, onion salt, garlic salt, mustard, or ketchup on your food.  ?? Use less salt (or none) when recipes call for it. You can often use half the salt a recipe calls for without losing flavor.  ?? Be physically active. Get at least 30 minutes of exercise on most days of the week. Walking is a good choice. You also may want to do other activities, such as running, swimming, cycling, or playing tennis or team sports.  ?? Limit alcohol to 2 drinks a day for men and 1 drink a day for women.  ?? Eat plenty of fruits, vegetables, and low-fat dairy products. Eat less saturated and total fats.  How is high blood pressure treated?  ?? Your doctor will suggest making lifestyle changes. For example, your doctor may ask you to eat healthy foods, quit smoking, lose extra weight, and be more active.  ?? If lifestyle changes don't help enough or your blood pressure is very high, you will have to take medicine every day.  Follow-up care is a key part of your treatment and safety. Be sure to make and go to all appointments, and call your doctor if you are having problems. It's also   a good idea to know your test results and keep a list of the medicines you take.   Where can you learn more?   Go to http://www.healthwise.net/[facility]  Enter P501 in the search box to learn more about "Learning About High Blood Pressure."   ?? 2006-2015 Healthwise[employer] Inc[employer]. Care instructions adapted under license by [employer] (which disclaims liability or warranty for this information). This care instruction is for use with your licensed healthcare professional. If you have questions about a medical condition or this instruction, always ask your healthcare professional. Healthwise, Incorporated disclaims  any warranty or liability for your use of this information.  Content Version: 10.4.390249; Current as of: October 25, 2012

## 2013-12-21 NOTE — Progress Notes (Signed)
[name] is a 58 y.o. male here for follow-up.    1. Have you been to the ER, urgent care clinic since your last visit?  Hospitalized since your last visit?No    2. Have you seen or consulted any other health care providers outside of the [facility] since your last visit?  Include any pap smears or colon screening. No

## 2013-12-21 NOTE — Progress Notes (Signed)
Maurice [name] is a 58 y.o. male and presents with Follow-up     Subjective:  Pt feels he's doing well.  Vision has improved with a change in prescription.      DMII - mostly controlled.  Followed by Dr. Alvira Monday- last seen 09/2013.  Has new pump and was struggling with settings.  Needs glucagon kit refill.    HTN - on lisinopril and clonidine 0.40m bid.  Wants to get rid of one of the meds.  Had been taking clonidine to help with  Neuropathy.  However pt is already on neurontin and thinks this helps.    ROS:  Constitutional: No recent weight change. No weakness/fatigue.  No f/c.   HENT: No HA, dizziness. No hearing loss/tinnitus.  No nasal congestion/discharge.   Eyes: + change in vision, no double/blurred vision or eye pain/redness.    Cardiovascular: No CP/palpitations.  No DOE/orthopnea/PND.   Respiratory: No cough/sputum, dyspnea, wheezing.   Gastointestinal: No dysphagia, reflux.  No n/v.  No constipation/diarrhea.  No melena/rectal bleeding.   Musculoskeletal: No joint pain/stiffness.  No muscle pain/tenderness.     The problem list was updated as a part of today's visit.  Patient Active Problem List   Diagnosis Code   ??? Benign hypertension 401.1   ??? Dyslipidemia 272.4   ??? DM (diabetes mellitus) ([place] 250.00   ??? 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 Diabetic neuropathy (HCC) 250.60, 357.2   ??? GERD (gastroesophageal reflux disease) 530.81   ??? History of nephrolithiasis V13.01   ??? Insulin pump in place V45.85   ??? Diabetic retinopathy (HCC) 250.50, 362.01   ??? Diplopia 368.2   ??? 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 Encephalomalacia on imaging study 348.89   ??? History of stroke V12.54   ??? 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      SH:  History   Substance Use Topics   ??? Smoking status: Never Smoker    ??? Smokeless tobacco: Never Used   ??? Alcohol Use: 1.5 oz/week     3 Cans of beer per  week         Medications/Allergies:  Current Outpatient Prescriptions on File Prior to Visit   Medication Sig Dispense Refill   ??? atorvastatin (LIPITOR) 20 mg tablet TAKE 1 TABLET BY MOUTH EVERY DAY  90 tablet  1   ??? gabapentin (NEURONTIN) 300 mg capsule TAKE 1 CAPSULE BY MOUTH THREE TIMES DAILY  90 capsule  6   ??? ranitidine (ZANTAC) 150 mg tablet Take 1 tablet by mouth nightly.       ??? omeprazole (PRILOSEC) 40 mg capsule Take 1 capsule by mouth daily.  90 capsule  1   ??? clopidogrel (PLAVIX) 75 mg tablet Take 1 tablet by mouth daily.  90 tablet  3   ??? lisinopril (PRINIVIL) 20 mg tablet Take 1 tablet by mouth daily.  90 tablet  3   ??? HUMALOG 100 unit/mL injection USE AS DIRECTED WITH PUMP  180 mL  12   ??? ascorbic acid (VITAMIN C) 1,000 mg tablet Take  by mouth.         ??? multivitamins-minerals-lutein (CENTRUM SILVER) Tab Take  by mouth.         ??? vitamin e (E GEMS) 1,000 unit capsule Take 1,000 Units by mouth daily.         ???  Cholecalciferol, Vitamin D3, 5,000 unit Tab Take  by mouth.         ??? omega-3 fatty acids-vitamin e (FISH OIL) 1,000 mg cap Take 1 Cap by mouth.         ??? coenzyme q10 10 mg cap Take  by mouth.         ??? albuterol (PROVENTIL HFA, VENTOLIN HFA, PROAIR HFA) 90 mcg/actuation inhaler Take 2 puffs by inhalation every four (4) hours as needed for Shortness of Breath.  1 Inhaler  0     No current facility-administered medications on file prior to visit.          No Known Allergies    Objective:  BP 111/64   Pulse 74   Temp(Src) 98.1 ??F (36.7 ??C) (Oral)   Resp 20   Ht '5\' 8"'  (1.727 m)   Wt 178 lb (80.74 kg)   BMI 27.07 kg/m2   SpO2 97%   Constitutional: Well developed, nourished, no distress, alert   Eyes: Conjunctiva normal. PERRL.    CV: S1, S2.  RRR.  No murmurs/rubs.  No thrills palpated.  No carotid bruits.  Intact distal pulses.  No edema.   Pulm: No abnormalities on inspection.  Clear to auscultation bilaterally. No wheezing/rhonchi.  Normal effort.     GI: Soft, nontender, nondistended.  Normal  active bowel sounds.    Neuro: A/O x 3. No focal motor or sensory deficits. Speech normal.   Psych: Appropriate affect, judgement and insight.  Short-term memory intact.       Assessment/Plan:      Champine was seen today for follow-up.    Diagnoses and associated orders for this visit:    Benign hypertension  - lisinopril (PRINIVIL) 20 mg tablet; Take 1 Tab by mouth two (2) times a day. Taper off clonidine.  Increase lisinopril to bid dosing.  Monitor bp.  F/u in 3 mos, sooner if bp >140/90.    Type 2 diabetes mellitus with diabetic polyneuropathy (HCC)  - glucagon (GLUCAGON EMERGENCY KIT, HUMAN,) 1 mg injection; 1 mg by IntraMUSCular route as needed (low blood sugar).    Diabetic polyneuropathy associated with type 2 diabetes mellitus (HCC)  - clonazePAM (KLONOPIN) 0.5 mg tablet; Take 1 Tab by mouth two (2) times a day. refilled          The plan was discussed with the patient.  The patient verbalized understanding and is in agreement with the plan.  All medication potential side effects were discussed with the patient.    Health Maintenance:   Health Maintenance   Topic Date Due   ??? TDAP AGE > 18  09/25/1974   ??? Td Q 10 Yrs Age > 18  09/25/1974   ??? MICROALBUMIN Q1  03/07/2014   ??? INFLUENZA AGE 65 TO ADULT  03/16/2014   ??? HEMOGLOBIN A1C Q6M  06/23/2014   ??? FOOT EXAM Q1  07/27/2014   ??? LIPID PANEL Q1  07/27/2014   ??? EYE EXAM DILATED Q1  12/22/2014   ??? COLONOSCOPY  08/15/2018

## 2014-01-03 NOTE — Telephone Encounter (Signed)
Pt calling and stating he is not ready for his refill. He states if he gets it now they will deny it. He doesn't need until July he will notify us.

## 2014-01-25 MED ORDER — OMEPRAZOLE 40 MG CAP, DELAYED RELEASE
40 mg | ORAL_CAPSULE | ORAL | Status: DC
Start: 2014-01-25 — End: 2014-04-22

## 2014-02-25 MED ORDER — ATORVASTATIN 20 MG TAB
20 mg | ORAL_TABLET | ORAL | Status: DC
Start: 2014-02-25 — End: 2014-08-19

## 2014-03-04 ENCOUNTER — Encounter

## 2014-03-04 MED ORDER — CLONAZEPAM 0.5 MG TAB
0.5 mg | ORAL_TABLET | Freq: Two times a day (BID) | ORAL | Status: DC
Start: 2014-03-04 — End: 2014-09-09

## 2014-03-18 ENCOUNTER — Encounter

## 2014-03-18 MED ORDER — LISINOPRIL 20 MG TAB
20 mg | ORAL_TABLET | Freq: Two times a day (BID) | ORAL | Status: DC
Start: 2014-03-18 — End: 2015-01-03

## 2014-03-21 MED ORDER — GABAPENTIN 300 MG CAP
300 mg | ORAL_CAPSULE | ORAL | Status: DC
Start: 2014-03-21 — End: 2014-06-14

## 2014-03-29 NOTE — Patient Instructions (Signed)
DTaP (Tetanus, Diphtheria, Pertussis) Vaccine: What You Need to Know  Why get vaccinated?  Diphtheria, tetanus, and pertussis are serious diseases caused by bacteria. Diphtheria and pertussis are spread from person to person. Tetanus enters the body through cuts or wounds.  DIPHTHERIA causes a thick covering in the back of the throat.  ?? It can lead to breathing problems, paralysis, heart failure, and even death.  TETANUS (Lockjaw) causes painful tightening of the muscles, usually all over the body.  ?? It can lead to "locking" of the jaw so the victim cannot open his mouth or swallow. Tetanus leads to death in up to 2 out of 10 cases.  PERTUSSIS (Whooping Cough) causes coughing spells so bad that it is hard for infants to eat, drink, or breathe. These spells can last for weeks.  ?? It can lead to pneumonia, seizures (jerking and staring spells), brain damage, and death.  Diphtheria, tetanus, and pertussis vaccine (DTaP) can help prevent these diseases. Most children who are vaccinated with DTaP will be protected throughout childhood. Many more children would get these diseases if we stopped vaccinating.  DTaP is a safer version of an older vaccine called DTP. DTP is no longer used in the [place].  Who should get DTaP vaccine and when?  Children should get 5 doses of DTaP vaccine, one dose at each of the following ages:  ?? 2 months  ?? 4 months  ?? 6 months  ?? 15???18 months  ?? 4???6 years  DTaP may be given at the same time as other vaccines.  Some children should not get DTaP vaccine or should wait.  ?? Children with minor illnesses, such as a cold, may be vaccinated. But children who are moderately or severely ill should usually wait until they recover before getting DTaP vaccine.  ?? Any child who had a life-threatening allergic reaction after a dose of DTaP should not get another dose.  ?? Any child who suffered a brain or nervous system disease within 7 days after a dose of DTaP should not get another dose.   ?? Talk with your doctor if your child:  ?? Had a seizure or collapsed after a dose of DTaP.  ?? Cried non-stop for 3 hours or more after a dose of DTaP.  ?? Had a fever over 105??F after a dose of DTaP.  Ask your doctor for more information. Some of these children should not get another dose of pertussis vaccine, but may get a vaccine without pertussis, called DT.  Older children and adults  DTaP is not licensed for adolescents, adults, or children 7 years of age and older.  But older people still need protection. A vaccine called Tdap is similar to DTaP. A single dose of Tdap is recommended for people 11 through 58 years of age. Another vaccine, called Td, protects against tetanus and diphtheria, but not pertussis. It is recommended every 10 years. There are separate Vaccine Information Statements for these vaccines.  What are the risks from DTaP vaccine?  Getting diphtheria, tetanus, or pertussis disease is much riskier than getting DTaP vaccine.  However, a vaccine, like any medicine, is capable of causing serious problems, such as severe allergic reactions. The risk of DTaP vaccine causing serious harm, or death, is extremely small.  Mild Problems (Common)  ?? Fever (up to about 1 child in 4)  ?? Redness or swelling where the shot was given (up to about 1 child in 4)  ?? Soreness or tenderness where the   shot was given (up to about 1 child in 4)  These problems occur more often after the 4th and 5th doses of the DTaP series than after earlier doses. Sometimes the 4th or 5th dose of DTaP vaccine is followed by swelling of the entire arm or leg in which the shot was given, lasting 1???7 days (up to about 1 child in 30).  Other mild problems include:   ?? Fussiness (up to about 1 child in 3)  ?? Tiredness or poor appetite (up to about 1 child in 10)  ?? Vomiting (up to about 1 child in 50)  These problems generally occur 1???3 days after the shot.  Moderate Problems (Uncommon)   ?? Seizure (jerking or staring) (about 1 child out of 14,000)  ?? Non-stop crying, for 3 hours or more (up to about 1 child out of 1,000)  ?? High fever, over 105??F (about 1 child out of 16,000)  Severe Problems (Very Rare)  ?? Serious allergic reaction (less than 1 out of a million doses)  ?? Several other severe problems have been reported after DTaP vaccine. These include:  ?? Long-term seizures, coma, or lowered consciousness.  ?? Permanent brain damage.  These are so rare it is hard to tell if they are caused by the vaccine.  Controlling fever is especially important for children who have had seizures, for any reason. It is also important if another family member has had seizures. You can reduce fever and pain by giving your child an aspirin-free pain reliever when the shot is given, and for the next 24 hours, following the package instructions.  What if there is a serious reaction?  What should I look for?  ?? Look for anything that concerns you, such as signs of a severe allergic reaction, very high fever, or behavior changes. Signs of a severe allergic reaction can include hives, swelling of the face and throat, difficulty breathing, a fast heartbeat, dizziness, and weakness. These would start a few minutes to a few hours after the vaccination.  What should I do?  ?? If you think it is a severe allergic reaction or other emergency that can't wait, call 9-1-1 or get the person to the nearest hospital. Otherwise, call your doctor.  ?? Afterward, the reaction should be reported to the Vaccine Adverse Event Reporting System (VAERS). Your doctor might file this report, or you can do it yourself through the VAERS web site at www.vaers.hhs.gov, or by calling 1-800-822-7967.  VAERS is only for reporting reactions. They do not give medical advice.  The National Vaccine Injury Compensation Program  The National Vaccine Injury Compensation Program (VICP) is a federal  program that was created to compensate people who may have been injured by certain vaccines.  Persons who believe they may have been injured by a vaccine can learn about the program and about filing a claim by calling 1-800-338-2382 or visiting the VICP website at www.hrsa.gov/vaccinecompensation.  How can I learn more?  ?? Ask your doctor.  ?? Call your local or state health department.  ?? Contact the Centers for Disease Control and Prevention (CDC):  ?? Call 1-800-232-4636 (1-800-CDC-INFO) or  ?? Visit CDC's website at www.cdc.gov/vaccines  Vaccine Information Statement  DTaP (Tetanus, Diphtheria, Pertussis ) Vaccine  (12/30/2005)  42 [place] ?? 300aa-26  Department of Health and Human Services  Centers for Disease Control and Prevention  Many Vaccine Information Statements are available in Spanish and other languages. See www.immunize.org/vis.  Muchas hojas de informaci??n sobre vacunas   est??n disponibles en espa??ol y en otros idiomas. Visite www.immunize.org/vis.  Content Version: 10.5.422740

## 2014-03-29 NOTE — Progress Notes (Signed)
Maurice [name] is a 58 y.o. male here today for follow up visit.    1. Have you been to the ER, urgent care clinic since your last visit?  Hospitalized since your last visit?No    2. Have you seen or consulted any other health care providers outside of the [facility] since your last visit?  Include any pap smears or colon screening. Yes Where: Endocrinology, Opthamalogy, Dr.Okeeke,

## 2014-03-29 NOTE — Progress Notes (Addendum)
Assessment/Plan:    Bones was seen today for follow-up.    Diagnoses and associated orders for this visit:    Pure hypercholesterolemia- previously well controlled.  Due for labs.  Cont statin.  - TSH RFX ON ABNORMAL TO FREE T4  - LIPID PANEL    Benign hypertension- controlled on bid ACEI.  Cont current regimen.  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 Alvira Monday.  - HEMOGLOBIN A1C    Cerebral microvascular disease  -discussed risk factor modification, including tight glucose control, BP and lipid control.  Cont plavix.      The plan was discussed with the patient.  The patient verbalized understanding and is in agreement with the plan.  All medication potential side effects were discussed with the patient.    Health Maintenance:   Health Maintenance   Topic Date Due   ??? Hepatitis C Screening  21-Oct-1955   ??? Tdap Age > 18  09/25/1974   ??? Td Q 10 Yrs Age > 18  09/25/1974   ??? INFLUENZA AGE 60 TO ADULT  03/16/2014   ??? HEMOGLOBIN A1C Q6M  06/23/2014   ??? FOOT EXAM Q1  07/27/2014   ??? LIPID PANEL Q1  07/27/2014   ??? MICROALBUMIN Q1  10/13/2014   ??? EYE EXAM RETINAL OR DILATED Q1  01/05/2015   ??? COLONOSCOPY  08/15/2018       Glenden Rossell is a 58 y.o. male and presents with Follow-up     Subjective:  HTN - lisinopril dose increased at last visit in effort to wean off clonidine.  bp low-nml today.  bp running 120s/80s off of clonidine.    DMI w/ neuropathy -sx controlled on neurontin.    HLD - on statin.  Due for labs.    Pt thinks he's had mini-strokes, even with being on plavix.  States he will have episodes of being unstable on his feet, his speech will change.  States he has a slower speech, but no dysarthria.  No numbness/weakness.  No associated HA.  Is on plavix, LDL controlled.  DM1 could be better controlled.  HTN well controlled.    ROS:  Constitutional: No recent weight change. No weakness/fatigue.  No f/c.    Cardiovascular: No CP/palpitations.  No DOE/orthopnea/PND.   Respiratory: No cough/sputum, dyspnea, wheezing.   Gastointestinal: No dysphagia, reflux.  No n/v.  No constipation/diarrhea.  No melena/rectal bleeding.   Neurological: No seizures/numbness/weakness.  No paresthesias.   Psychiatric:  No depression, anxiety.     The problem list was updated as a part of today's visit.  Patient Active Problem List   Diagnosis Code   ??? Benign hypertension 401.1   ??? 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 Diabetic neuropathy (HCC) 250.60, 357.2   ??? GERD (gastroesophageal reflux disease) 530.81   ??? History of nephrolithiasis V13.01   ??? Insulin pump in place V45.85   ??? Diabetic retinopathy (HCC) 250.50, 362.01   ??? Diplopia 368.2   ??? 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 Encephalomalacia on imaging study 348.89   ??? History of stroke V12.54   ??? 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 Branch retinal vein occlusion 362.36   ??? 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 Pure hypercholesterolemia 272.0   ??? Diabetes mellitus type 1 with neurological manifestations ([place]) 250.61  The PSH, FH were reviewed.    SH:  History   Substance Use Topics   ??? Smoking status: Never Smoker    ??? Smokeless tobacco: Never Used   ??? Alcohol Use: 1.5 oz/week     3 Cans of beer per week       Medications/Allergies:  Current Outpatient Prescriptions on File Prior to Visit   Medication Sig Dispense Refill   ??? gabapentin (NEURONTIN) 300 mg capsule TAKE 1 CAPSULE BY MOUTH THREE TIMES DAILY 90 Cap 2   ??? lisinopril (PRINIVIL) 20 mg tablet Take 1 Tab by mouth two (2) times a day. 180 Tab 3   ??? clonazePAM (KLONOPIN) 0.5 mg tablet Take 1 Tab by mouth two (2) times a day. 180 Tab 1   ??? atorvastatin (LIPITOR) 20 mg tablet TAKE 1 TABLET BY MOUTH EVERY DAY 90 Tab 1   ??? omeprazole (PRILOSEC) 40 mg capsule TAKE 1 CAPSULE BY MOUTH DAILY. 90 Cap 0    ??? brimonidine-timolol (COMBIGAN) 0.2-0.5 % drop ophthalmic solution Administer 1 Drop to both eyes every twelve (12) hours.     ??? glucagon (GLUCAGON EMERGENCY KIT, HUMAN,) 1 mg injection 1 mg by IntraMUSCular route as needed (low blood sugar). 3 Kit 1   ??? clopidogrel (PLAVIX) 75 mg tablet Take 1 tablet by mouth daily. 90 tablet 3   ??? HUMALOG 100 unit/mL injection USE AS DIRECTED WITH PUMP 180 mL 12   ??? ascorbic acid (VITAMIN C) 1,000 mg tablet Take  by mouth.       ??? multivitamins-minerals-lutein (CENTRUM SILVER) Tab Take  by mouth.       ??? vitamin e (E GEMS) 1,000 unit capsule Take 1,000 Units by mouth daily.       ??? Cholecalciferol, Vitamin D3, 5,000 unit Tab Take  by mouth.       ??? omega-3 fatty acids-vitamin e (FISH OIL) 1,000 mg cap Take 1 Cap by mouth.       ??? coenzyme q10 10 mg cap Take  by mouth.         No current facility-administered medications on file prior to visit.        No Known Allergies    Objective:  BP 100/68 mmHg   Pulse 79   Temp(Src) 98.2 ??F (36.8 ??C) (Oral)   Resp 20   Ht 5' 8" (1.727 m)   Wt 176 lb (79.833 kg)   BMI 26.77 kg/m2   SpO2 96%   Constitutional: Well developed, nourished, no distress, alert   CV: S1, S2.  RRR.  No murmurs/rubs.  No thrills palpated.  No carotid bruits.  Intact distal pulses.  No edema.   Pulm: No abnormalities on inspection.  Clear to auscultation bilaterally. No wheezing/rhonchi.  Normal effort.     Neuro: A/O x 3. No focal motor or sensory deficits. Speech normal.   Psych: Appropriate affect, judgement and insight.  Short-term memory intact.

## 2014-04-12 ENCOUNTER — Encounter

## 2014-04-12 NOTE — Progress Notes (Signed)
Patient here for tdap. Given. Tolerated well.

## 2014-04-13 LAB — LIPID PANEL
Cholesterol, total: 136 mg/dL (ref 100–199)
HDL Cholesterol: 46 mg/dL (ref >39)
LDL, calculated: 74 mg/dL (ref 0–99)
Triglyceride: 80 mg/dL (ref 0–149)
VLDL, calculated: 16 mg/dL (ref 5–40)

## 2014-04-13 LAB — HEMOGLOBIN A1C WITH EAG: Hemoglobin A1c: 7.1 % — ABNORMAL HIGH (ref 4.8–5.6)

## 2014-04-13 LAB — METABOLIC PANEL, COMPREHENSIVE
A-G Ratio: 2 (ref 1.1–2.5)
ALT (SGPT): 20 IU/L (ref 0–44)
AST (SGOT): 19 IU/L (ref 0–40)
Albumin: 4.3 g/dL (ref 3.5–5.5)
Alk. phosphatase: 79 IU/L (ref 39–117)
BUN/Creatinine ratio: 15 (ref 9–20)
BUN: 15 mg/dL (ref 6–24)
Bilirubin, total: 0.4 mg/dL (ref 0.0–1.2)
CO2: 23 mmol/L (ref 18–29)
Calcium: 9.4 mg/dL (ref 8.7–10.2)
Chloride: 102 mmol/L (ref 97–108)
Creatinine: 0.97 mg/dL (ref 0.76–1.27)
GFR est AA: 99 mL/min/{1.73_m2} (ref >59)
GFR est non-AA: 86 mL/min/{1.73_m2} (ref >59)
GLOBULIN, TOTAL: 2.2 g/dL (ref 1.5–4.5)
Glucose: 104 mg/dL — ABNORMAL HIGH (ref 65–99)
Potassium: 4.5 mmol/L (ref 3.5–5.2)
Protein, total: 6.5 g/dL (ref 6.0–8.5)
Sodium: 142 mmol/L (ref 134–144)

## 2014-04-13 LAB — CVD REPORT

## 2014-04-13 LAB — TSH RFX ON ABNORMAL TO FREE T4: TSH: 1.36 u[IU]/mL (ref 0.450–4.500)

## 2014-04-23 MED ORDER — OMEPRAZOLE 40 MG CAP, DELAYED RELEASE
40 mg | ORAL_CAPSULE | ORAL | Status: DC
Start: 2014-04-23 — End: 2015-04-09

## 2014-05-11 LAB — CBC WITH AUTOMATED DIFF
BASOPHILS: 0.3 % (ref 0–3)
EOSINOPHILS: 0 % (ref 0–5)
HCT: 45.2 % (ref 37.0–50.0)
HGB: 15.8 gm/dl (ref 12.4–17.2)
IMMATURE GRANULOCYTES: 0.3 % (ref 0.0–3.0)
LYMPHOCYTES: 7.2 % — ABNORMAL LOW (ref 28–48)
MCH: 32 pg (ref 23.0–34.6)
MCHC: 35 gm/dl (ref 30.0–36.0)
MCV: 91.7 fL (ref 80.0–98.0)
MONOCYTES: 6.9 % (ref 1–13)
MPV: 10.3 fL — ABNORMAL HIGH (ref 6.0–10.0)
NEUTROPHILS: 85.3 % — ABNORMAL HIGH (ref 34–64)
NRBC: 0 (ref 0–0)
PLATELET: 238 10*3/uL (ref 140–450)
RBC: 4.93 M/uL (ref 3.80–5.70)
RDW-SD: 42.2 (ref 35.1–43.9)
WBC: 15.6 10*3/uL — ABNORMAL HIGH (ref 4.0–11.0)

## 2014-05-11 LAB — METABOLIC PANEL, COMPREHENSIVE
ALT (SGPT): 57 U/L (ref 12–78)
AST (SGOT): 37 U/L (ref 15–37)
Albumin: 4 gm/dl (ref 3.4–5.0)
Alk. phosphatase: 100 U/L (ref 45–117)
BUN: 23 mg/dl (ref 7–25)
Bilirubin, total: 1 mg/dl (ref 0.2–1.0)
CO2: 23 mEq/L (ref 21–32)
Calcium: 9 mg/dl (ref 8.5–10.1)
Chloride: 99 mEq/L (ref 98–107)
Creatinine: 1.1 mg/dl (ref 0.6–1.3)
GFR est AA: >60
GFR est non-AA: >60
Glucose: 281 mg/dl — ABNORMAL HIGH (ref 74–106)
Potassium: 4.6 mEq/L (ref 3.5–5.1)
Protein, total: 6.9 gm/dl (ref 6.4–8.2)
Sodium: 132 mEq/L — ABNORMAL LOW (ref 136–145)

## 2014-05-11 LAB — POC URINE MACROSCOPIC
Bilirubin: NEGATIVE
Blood: NEGATIVE
Glucose: 500 mg/dl — AB
Ketone: 80 mg/dl — AB
Leukocyte Esterase: NEGATIVE
Nitrites: NEGATIVE
Specific gravity: 1.02 (ref 1.005–1.030)
Urobilinogen: 0.2 EU/dl (ref 0.0–1.0)
pH (UA): 5.5 (ref 5–9)

## 2014-05-11 LAB — POC BLOOD GAS + LACTIC ACID
BASE EXCESS: 1 mmol/L (ref <=3)
BICARBONATE: 24.8 mmol/L (ref 22.0–26.0)
CO2, TOTAL: 26 mmol/L (ref 21–32)
Lactic Acid: 1.32 mmol/L (ref 0.40–2.00)
O2 SAT: 94 % — ABNORMAL HIGH (ref 70–75)
PCO2: 36.8 mm Hg — ABNORMAL LOW (ref 41.0–51.0)
PO2: 68 mm Hg — ABNORMAL HIGH (ref 35–40)
pH: 7.437 — ABNORMAL HIGH (ref 7.310–7.410)

## 2014-05-11 LAB — TROPONIN I: Troponin-I: <0.015 ng/ml (ref 0.00–0.09)

## 2014-05-11 LAB — LIPASE: Lipase: 42 U/L — ABNORMAL LOW (ref 73–393)

## 2014-05-12 LAB — METABOLIC PANEL, COMPREHENSIVE
ALT (SGPT): 38 U/L (ref 12–78)
AST (SGOT): 25 U/L (ref 15–37)
Albumin: 2.9 gm/dl — ABNORMAL LOW (ref 3.4–5.0)
Alk. phosphatase: 65 U/L (ref 45–117)
BUN: 13 mg/dl (ref 7–25)
Bilirubin, total: 0.8 mg/dl (ref 0.2–1.0)
CO2: 29 mEq/L (ref 21–32)
Calcium: 7.5 mg/dl — ABNORMAL LOW (ref 8.5–10.1)
Chloride: 105 mEq/L (ref 98–107)
Creatinine: 0.9 mg/dl (ref 0.6–1.3)
GFR est AA: >60
GFR est non-AA: >60
Glucose: 151 mg/dl — ABNORMAL HIGH (ref 74–106)
Potassium: 3.9 mEq/L (ref 3.5–5.1)
Protein, total: 5.4 gm/dl — ABNORMAL LOW (ref 6.4–8.2)
Sodium: 138 mEq/L (ref 136–145)

## 2014-05-12 LAB — CBC WITH AUTOMATED DIFF
BASOPHILS: 0.6 % (ref 0–3)
EOSINOPHILS: 1.2 % (ref 0–5)
HCT: 40.3 % (ref 37.0–50.0)
HGB: 13.7 gm/dl (ref 12.4–17.2)
IMMATURE GRANULOCYTES: 0.2 % (ref 0.0–3.0)
LYMPHOCYTES: 30.9 % (ref 28–48)
MCH: 31.6 pg (ref 23.0–34.6)
MCHC: 34 gm/dl (ref 30.0–36.0)
MCV: 93.1 fL (ref 80.0–98.0)
MONOCYTES: 11.3 % (ref 1–13)
MPV: 10.5 fL — ABNORMAL HIGH (ref 6.0–10.0)
NEUTROPHILS: 55.8 % (ref 34–64)
NRBC: 0 (ref 0–0)
PLATELET: 211 10*3/uL (ref 140–450)
RBC: 4.33 M/uL (ref 3.80–5.70)
RDW-SD: 44.2 — ABNORMAL HIGH (ref 35.1–43.9)
WBC: 9.1 10*3/uL (ref 4.0–11.0)

## 2014-05-12 LAB — CKMB PROFILE
CK - MB: 2.4 ng/ml (ref 0.0–3.6)
CK - MB: 2.3 ng/ml (ref 0.0–3.6)
CK-MB Index: 1.8 % (ref 0.0–4.9)
CK-MB Index: 1.8 % (ref 0.0–4.9)
CK: 135 U/L (ref 39–308)
CK: 125 U/L (ref 39–308)

## 2014-05-12 LAB — PTT: aPTT: 28.9 seconds (ref 24.9–35.6)

## 2014-05-12 LAB — PROTHROMBIN TIME + INR
INR: 1 (ref 0.0–1.1)
Prothrombin time: 12.6 seconds (ref 11.5–14.0)

## 2014-05-12 LAB — HEMOGLOBIN A1C WITH EAG: Hemoglobin A1c: 7.3 % — ABNORMAL HIGH (ref 4.8–6.0)

## 2014-05-12 LAB — GLUCOSE, POC
Glucose (POC): 196 mg/dL — ABNORMAL HIGH (ref 65–105)
Glucose (POC): 109 mg/dL — ABNORMAL HIGH (ref 65–105)

## 2014-05-12 LAB — TROPONIN I
Troponin-I: 0.018 ng/ml (ref 0.00–0.09)
Troponin-I: <0.015 ng/ml (ref 0.00–0.09)

## 2014-05-12 NOTE — H&P (Addendum)
[facility]  History and Physical  NAME:  Maurice [name], Maurice [name]  SEX:   M  ADMIT: 05/11/2014  DOB:08/12/1956  MR#    [id number]  ROOM:  [id number]  ACCT#  [id number]    I hereby certify this patient for admission based upon medical necessity as  noted below:    <    CHIEF COMPLAINT:  Nausea and vomiting and abdominal pain.    HISTORY OF PRESENT ILLNESS:  The patient is a 58 year old male with history of hypertension,  hyperlipidemia, diabetes mellitus type 1 and pancreatitis who presents with  complaints of having nausea and vomiting since earlier in the day prior to  evaluation in the afternoon.  The patient also has had complaints of upper  abdominal pain in the periumbilical and epigastric areas associated with his  nausea, vomiting.  He denies any fever or chills.  No shortness of breath or  chest pain.  The patient denies any known sick exposures.  He has not had any  diarrhea.  The patient relates that he noted that his blood sugar was 321.  The patient states his blood sugars have been under good control prior to this  and this is unusual for him.  The patient is on insulin pump and when he  checked his pump mechanism, he noted that there was a kink in the catheter  associated with the insulin pump and he had not been receiving his insulin as  he normally would 1st basal dosing.  The patient states that he changed the  catheter and treated himself with insulin dosing with gradual increase in his  dosing.  The patient also drinks some apple juice to offset any potential  hypoglycemic side effects.  He then came to the Emergency Department for  further evaluation and treatment as he continued to have nausea and vomiting  associated with this as well as upper abdominal pain.    PAST MEDICAL HISTORY:  Hypertension, GERD, hyperlipidemia, pancreatitis and insulin-dependent   diabetes.    SURGICAL HISTORY:  Includes right carotid surgery.    PSYCHIATRIC HISTORY:  Includes anxiety.    ALLERGIES:   NO KNOWN DRUG ALLERGIES.    MEDICATIONS:  Lisinopril 20 mg p.o. daily, timolol 1 drop to affected eye once a day,  omeprazole 40 mg daily, Lipitor 20 mg daily, clonazepam 0.5 mg 2 times daily  p.r.n., gabapentin 300 mg once a day at bedtime,  Humalog via insulin pump and  Plavix 75 mg daily at bedtime.    FAMILY HISTORY:  Noncontributory.    SOCIAL HISTORY:  Reveals the patient does not smoke.  He does drink alcohol socially on  occasion.  He denies any illicit drug use.    REVIEW OF SYSTEMS:  1.  See above.  2.  ROS is positive for intractable nausea and vomiting associated with  abdominal pain.  The patient denies any fever or chills.  No shortness of  breath or chest pain.  A 12-point review of systems has been obtained.  ROS is  otherwise noncontributory.    PHYSICAL EXAMINATION:  VITAL SIGNS:  Reveals a blood pressure of 127/73, pulse 101, respirations 18,  temp 98.1, pulse oximetry 98% on room air.  HEENT:  NC/AT, PERRLA, EOMI.  Dry mucous membranes.  HEENT otherwise  unremarkable.  NECK:  Supple and nontender.  HEART:  RRR.  LUNGS:  Clear bilaterally.  ABDOMEN:  Normoactive bowel sounds, soft, mild upper abdominal discomfort to  palpation, no guarding or rebound.  No  masses or HSM.  GENITOURINARY:  Normal male.  EXTREMITIES:  Normal and nontender without edema.  NEUROLOGIC:  Cranial nerves II-XII intact bilaterally.    LABORATORY EVALUATION:  Reveals a white blood count of 15.6, H&H 15.8 and 45.2, platelets 238,000,  85 segs. 7.2 lymphs, 6.9 monos. Sodium 132, potassium 4.6, chloride 99, CO2  23, BUN 23, creatinine 1.1, glucose 281.  Lipase is 42.  Venous blood gas  reveals pH 7.437, pCO2 of 36.8, pO2 of 68, bicarb 24.8, O2 saturation 94%.  Urinalysis reveals specific gravity 1.020, pH 5.5 and 82 ketones glucose 500,  trace protein, all else normal or negative.  EKG reveals sinus tachycardia  with ventricular rate of 118.    IMPRESSION:   A 58 year old male with history of hypertension, hyperlipidemia, diabetes  mellitus type 1, pancreatitis with following:  1.  Intractable nausea and vomiting secondary to diabetic ketoacidosis with  possible underlying gastritis, rule out infectious etiology.  2.  Acute hyperglycemia with history of insulin-dependent diabetes.  3.  Creatine phosphokinase, probable.  Suspect diabetic ketoacidosis, improved  and most likely resolved.  4.  Abdominal pain secondary to above, improved without evidence of acute  abdomen.  5.  Dehydration, mild, secondary to above.  6.  Leukocytosis, most likely secondary to above, nausea and vomiting, without  fever or evidence of overt infectious process or signs of pancreatitis.    PLAN:  The patient will be admitted to a monitored bed on telemetry in view of his  persistent tachycardia at this point.  He will be given oxygen if needed and  supportive care.  The patient will be placed on GI and DVT prophylaxis.  Electrolytes will be replaced as needed.  The patient will be hydrated with IV  fluids and monitored to avoid fluid overload.  A right upper quadrant  ultrasound has been obtained in the Emergency Department, and on preliminary  reading, no acute findings have been noted.  Additional imaging will be  deferred at this point unless patient begins to have more consistent findings  of any intra-abdominal process.  The patient insists on letting him continue   to  utilize his insulin pump for maintenance of his glucose control.  His blood  sugars will be monitored closely per DKA protocol; however, the patient will  be allowed to utilize his insulin pump, and he has been instructed to notify  the nursing staff of his blood glucose results so adjustments in his  treatment regimen may be accomplished and the patient's IV fluids may be  adjusted accordingly should the patient develop signs of hypoglycemia in  response to his insulin dosing.  The patient will also be monitored closely   for elevated blood glucose levels as well and a sliding scale insulin dosing  regimen will be considered if his insulin pump is not effective in this  regard.  The patient will be continued on medications for his intractable  nausea and vomiting.  He will be given p.r.n. pain medications for his  abdominal pain to be titrated to effect.  The patient does relate having some  chest discomfort with this initial nausea and vomiting.  In view of this  serial cardiac isoenzymes will be obtained per protocol and patient will be  monitored on telemetry as well to rule out any cardiac ischemic component in  regard to his complaints.  The patient will be placed on GI prophylaxis with  IV Protonix.  Home medications will otherwise be utilized whenever possible.  Further recommendations will be based on test results, response to treatment  and clinical course.      ___________________  Otilio Carpen MD  Dictated By: .   MR  D:05/12/2014 00:17:34  T: 05/12/2014 00:52:32  [id number]  Electronically Authenticated and Edited by:  Sharren Bridge Jmari Pelc, M.D. On 05/27/2014 08:57 AM EDT

## 2014-05-12 NOTE — ED Provider Notes (Addendum)
[facility]  EMERGENCY DEPARTMENT TREATMENT REPORT  NAME:  Maurice [name]  SEX:   M  ADMIT: 05/11/2014  DOB:   [birth year]  MR#    [id number]  ROOM:  [id number]  TIME DICTATED: 09 14 PM  ACCT#  [id number]    cc: Maurice Gravel MD    I hereby certify this patient for admission based upon medical necessity as  noted below:    TIME OF EVALUATION:  1547.    PRIMARY CARE PHYSICIAN:  Maurice Gravel, MD    CHIEF COMPLAINT:  Abdominal pain and hyperglycemia.    HISTORY OF PRESENT ILLNESS:  A 58 year old male is an insulin-dependent diabetic awoke this morning feeling  nauseous.  As he began to eat, had gradual onset of epigastric pain followed  by several episodes of vomiting and generalized aching.  No fever or diarrhea.  Has noticed throughout the day that his blood sugars are running high at 325.  When he checked his insulin pump, it appeared the cord was kinked; so, he  adjusted it just prior to coming into the Emergency Department.  His blood  sugars usually run under 200.  Abdominal pain is 8 out of 10, constant, achy.  No radiation.    REVIEW OF SYSTEMS:  CONSTITUTIONAL:  No fever, chills, sweats or recent illness.  EYES: No visual symptoms.  ENT: No sore throat, runny nose or other URI symptoms.  ENDOCRINE:  No diabetic symptoms.  RESPIRATORY:  No cough, shortness of breath, or wheezing.  CARDIOVASCULAR:  No chest pain, chest pressure, or palpitations.  GASTROINTESTINAL:  As above.  Normal bowel movements.  GENITOURINARY:  No dysuria, frequency, or urgency.  MUSCULOSKELETAL:  No back or flank pain.  INTEGUMENTARY:  No rashes.   NEUROLOGIC:  No headache.    PAST MEDICAL HISTORY:  Insulin-dependent diabetic on insulin pump, hypertension, questionable history  of pancreatitis, right carotid surgery, anxiety.    SOCIAL HISTORY:  No tobacco or recreational drug use, consumes alcohol socially.    FAMILY HISTORY:  Noncontributory.    ALLERGIES:  NONE.    MEDICATIONS:  Multiple and reviewed in [place].    PHYSICAL EXAMINATION:   VITAL SIGNS:  BP 159/82, pulse 125, respirations 16, temperature 98.1, pain 8,  O2 saturation 95% on room air.  GENERAL APPEARANCE:  Well developed, well nourished.  HEENT:  Eyes:  Conjunctivae clear, lids normal.  Pupils equal, symmetrical,  and normally reactive.    Mouth/Throat:  Surfaces of the pharynx, palate, and  tongue are pink, moist, and without lesions.     RESPIRATORY:  Clear and equal breath sounds.  No respiratory distress,  tachypnea, or accessory muscle use.   CARDIOVASCULAR:  Sinus tachycardia with rate around 115.  No murmurs, rubs or  gallops.  GASTROINTESTINAL:  Abdomen is soft, nondistended.  Minimal tenderness  epigastric area.  The rest of the abdomen nontender.  No rebound, guarding or  peritonitis.  No abdominal masses appreciated by inspection or palpation.  No  hepatomegaly or splenomegaly.  Bilaterally no CVA tenderness.  MUSCULOSKELETAL:  Moves all 4 extremities spontaneously.  SKIN:  Warm and dry without rash.     CONTINUATION BY Maurice Quint, MD:    INITIAL ASSESSMENT AND MANAGEMENT PLAN:  This 58 year old diabetic male, with hypertension and history of pancreatitis,  presents to the Emergency Department with nausea and multiple episodes of  vomiting.  He has also had hyperglycemia, suspect possible mild DKA. He does  have some fruity odor on evaluation of breathing.  We will obtain basic labs  and treat with IV fluids and antiemetics.    DIAGNOSTIC INTERPRETATIONS:  CBC:  White count is 15.6, hemoglobin and hematocrit is normal, platelet count  is normal.  VBG shows a pH of 7.437, pCO2 of 36.8.  Urinalysis has glucose at  500 and ketones at 80.  Lipase is 42.  CMP shows sodium of 132 and glucose of  281.  Bicarb is 23.  LFTs are within normal limits.  Troponin is negative.  Chest x-ray shows mild vascular congestion and bibasilar atelectatic change,  otherwise no focal consolidation.  Right upper quadrant ultrasound is an   unremarkable right upper quadrant ultrasound, no cholelithiasis.    EMERGENCY DEPARTMENT COURSE:   The patient was attached to cardiac, blood pressure and pulse oximetry  monitoring.  The patient was given IV fluids, IV antiemetics, but continued to  have vomiting here.  He was given Zofran and morphine for his abdominal pain  and aches.  He was given Zofran and then Phenergan.  The patient was also  started on Pepcid.  While the patient does not represent being in DKA  currently, I suspect that he has just closed his gap after he started self  treating his hyperglycemia.  I think that his gap is most recently closed  because he continues to have the ketones in his urine and I suspect that the  DKA may have contributed to this intractable vomiting.  He was unable to pass  the p.o. challenge here and so for that reason he is being brought into the  hospital to management his epigastric discomfort, which may be an early  gastritis that may be related to the DKA.  Discussed the case with Dr. Annamarie [name] and  he will admit the patient for IV fluids, continue monitoring of  his resolution of his DKA.    EMERGENCY DEPARTMENT DIAGNOSES:  1.  Hyperglycemia - resolving diabetic ketoacidosis.  2.  Intractable vomiting with possible gastritis.    PLAN:  As above.  Admission to observation telemetry with Dr. Langston [name].      ___________________  Maurice Edward MD  Dictated By: Maurice Grills, PA    My signature above authenticates this document and my orders, the final  diagnosis (es), discharge prescription (s), and instructions in the [facility] record.  Nursing notes have been reviewed by the physician/mid-level provider.    If you have any questions please contact [phone].    SB  D:05/11/2014 21:14:51  T: 05/12/2014 07:47:45  [id number]  Electronically Authenticated by:  Maurice Edward, MD On 06/04/2014 08:40 PM EDT

## 2014-06-14 MED ORDER — GABAPENTIN 300 MG CAP
300 mg | ORAL_CAPSULE | ORAL | Status: DC
Start: 2014-06-14 — End: 2014-06-21

## 2014-06-21 ENCOUNTER — Ambulatory Visit: Admit: 2014-06-21 | Discharge: 2014-06-21 | Payer: PRIVATE HEALTH INSURANCE | Attending: Internal Medicine

## 2014-06-21 DIAGNOSIS — E1040 Type 1 diabetes mellitus with diabetic neuropathy, unspecified: Secondary | ICD-10-CM

## 2014-06-21 MED ORDER — GABAPENTIN 300 MG CAP
300 mg | ORAL_CAPSULE | ORAL | Status: DC
Start: 2014-06-21 — End: 2015-06-20

## 2014-06-21 NOTE — Patient Instructions (Signed)
Diabetes Foot Care: After Your Visit  Your Care Instructions     When you have diabetes, your feet need extra care and attention. Diabetes can damage the nerve endings and blood vessels in your feet, making you less likely to notice when your feet are injured. Diabetes also limits your body's ability to fight infection and get blood to areas that need it. If you get a minor foot injury, it could become an ulcer or a serious infection. With good foot care, you can prevent most of these problems.  Caring for your feet can be quick and easy. Most of the care can be done when you are bathing or getting ready for bed.  Follow-up care is a key part of your treatment and safety. Be sure to make and go to all appointments, and call your doctor if you are having problems. It???s also a good idea to know your test results and keep a list of the medicines you take.  How can you care for yourself at home?  ?? Keep your blood sugar close to normal by watching what and how much you eat, monitoring blood sugar, taking medicines if prescribed, and getting regular exercise.  ?? Do not smoke. Smoking affects blood flow and can make foot problems worse. If you need help quitting, talk to your doctor about stop-smoking programs and medicines. These can increase your chances of quitting for good.  ?? Eat a diet that is low in fats. High fat intake can cause fat to build up in your blood vessels and decrease blood flow.  ?? Inspect your feet daily for blisters, cuts, cracks, or sores. If you cannot see well, use a mirror or have someone help you.  ?? Take care of your feet:  ?? Wash your feet every day. Use warm (not hot) water. Check the water temperature with your wrists or other part of your body, not your feet.  ?? Dry your feet well. Pat them dry. Do not rub the skin on your feet too hard. Dry well between your toes. If the skin on your feet stays moist, bacteria or a fungus can grow, which can lead to infection.   ?? Keep your skin soft. Use moisturizing skin cream to keep the skin on your feet soft and prevent calluses and cracks. But do not put the cream between your toes, and stop using any cream that causes a rash.  ?? Clean underneath your toenails carefully. Do not use a sharp object to clean underneath your toenails. Use the blunt end of a nail file or other rounded tool.  ?? Trim and file your toenails straight across to prevent ingrown toenails. Use a nail clipper, not scissors. Use an emery board to smooth the edges.  ?? Change socks daily. Socks without seams are best, because seams often rub the feet. You can find socks for people with diabetes from specialty catalogs.  ?? Look inside your shoes every day for things like gravel or torn linings, which could cause blisters or sores.  ?? Buy shoes that fit well:  ?? Look for shoes that have plenty of space around the toes. This helps prevent bunions and blisters.  ?? Try on shoes while wearing the kind of socks you will usually wear with the shoes.  ?? Avoid plastic shoes. They may rub your feet and cause blisters. Good shoes should be made of materials that are flexible and breathable, such as leather or cloth.  ?? Break in new shoes slowly   by wearing them for no more than an hour a day for several days. Take extra time to check your feet for red areas, blisters, or other problems after you wear new shoes.  ?? Do not go barefoot. Do not wear sandals, and do not wear shoes with very thin soles. Thin soles are easy to puncture. They also do not protect your feet from hot pavement or cold weather.  ?? Have your doctor check your feet during each visit. If you have a foot problem, see your doctor. Do not try to treat an early foot problem at home. Home remedies or treatments that you can buy without a prescription (such as corn removers) can be harmful.  ?? Always get early treatment for foot problems. A minor irritation can lead to a major problem if not properly cared for early.   When should you call for help?  Call your doctor now or seek immediate medical care if:  ?? You have a foot sore, an ulcer or break in the skin that is not healing after 4 days, bleeding corns or calluses, or an ingrown toenail.  ?? You have blue or black areas, which can mean bruising or blood flow problems.  ?? You have peeling skin or tiny blisters between your toes or cracking or oozing of the skin.  ?? You have a fever for more than 24 hours and a foot sore.  ?? You have new numbness or tingling in your feet that does not go away after you move your feet or change positions.  ?? You have unexplained or unusual swelling of the foot or ankle.  Watch closely for changes in your health, and be sure to contact your doctor if:  ?? You cannot do proper foot care.   Where can you learn more?   Go to http://www.healthwise.net/[facility]  Enter A739 in the search box to learn more about "Diabetes Foot Care: After Your Visit."   ?? 2006-2015 Healthwise, Inc[employer]. Care instructions adapted under license by [facility] (which disclaims liability or warranty for this information). This care instruction is for use with your licensed healthcare professional. If you have questions about a medical condition or this instruction, always ask your healthcare professional. Healthwise, Incorporated disclaims any warranty or liability for your use of this information.  Content Version: 10.5.422740; Current as of: June 29, 2013

## 2014-06-21 NOTE — Progress Notes (Signed)
Assessment/Plan:    Maurice [name] was seen today for diabetes and hypertension.    Diagnoses and all orders for this visit:    Type 1 diabetes mellitus with diabetic neuropathy ([place])  - Managed by dr. Alvira Monday.  Orders:  -     HM DIABETES FOOT EXAM    Benign hypertension  - controlled.    Other orders  -     gabapentin (NEURONTIN) 300 mg capsule; TAKE 1 CAPSULE BY MOUTH qhs GENERIC FOR NEURONTIN.        The plan was discussed with the patient.  The patient verbalized understanding and is in agreement with the plan.  All medication potential side effects were discussed with the patient.    Health Maintenance:   Health Maintenance   Topic Date Due   ??? Hepatitis C Screening  02-15-1956   ??? MICROALBUMIN Q1  10/13/2014   ??? HEMOGLOBIN A1C Q6M  11/10/2014   ??? INFLUENZA AGE 15 TO ADULT  03/17/2015   ??? LIPID PANEL Q1  04/13/2015   ??? EYE EXAM RETINAL OR DILATED Q1  05/03/2015   ??? FOOT EXAM Q1  06/22/2015   ??? COLONOSCOPY  08/15/2018   ??? Td Q 10 Yrs Age > 18  04/12/2024   ??? Tdap Age > 18  Completed       Maurice [name] is a 58 y.o. male and presents with Diabetes and Hypertension     Subjective:  DMII - bs controlled, mostly.  But overall reports fairly good control.  Is on insulin pump.    HTN - low end of normal.  No dizziness.    ROS:  Constitutional: No recent weight change. No weakness/fatigue.  No f/c.   Skin: No rashes, change in nails/hair, itching   HENT: No HA, dizziness. No hearing loss/tinnitus.  No nasal congestion/discharge.   Eyes: No change in vision, double/blurred vision or eye pain/redness.    Cardiovascular: No CP/palpitations.  No DOE/orthopnea/PND.   Respiratory: No cough/sputum, dyspnea, wheezing.   Gastointestinal: No dysphagia, reflux.  No n/v.  No constipation/diarrhea.  No melena/rectal bleeding.   Genitourinary: No dysuria, urinary hesitancy, nocturia, hematuria.  No incontinence.   Musculoskeletal: No joint pain/stiffness.  No muscle pain/tenderness.   Endo: No heat/cold intolerance, no polyuria/polydypsia.    Heme: No h/o anemia.  No easy bleeding/bruising.   Allergy/Immunology: No seasonal rhinitis. Denies frequent colds, sinus/ear infections.   Neurological: No seizures/numbness/weakness.  No paresthesias.   Psychiatric:  No depression, anxiety.     The problem list was updated as a part of today's visit.  Patient Active Problem List   Diagnosis Code   ??? Benign hypertension I10   ??? 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 Diabetic neuropathy (HCC) E11.40   ??? GERD (gastroesophageal reflux disease) K21.9   ??? History of nephrolithiasis Z87.442   ??? Insulin pump in place Z96.41   ??? 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 Diplopia H53.2   ??? 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 Encephalomalacia on imaging study G93.89   ??? History of stroke Z86.73   ??? 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 Pure hypercholesterolemia E78.0   ??? Diabetes mellitus type 1 with neurological manifestations (HCC) E10.40       The PSH, FH were reviewed.    SH:  History   Substance Use Topics   ???  Smoking status: Never Smoker    ??? Smokeless tobacco: Never Used   ??? Alcohol Use: 1.5 oz/week     3 Cans of beer per week       Medications/Allergies:  Current Outpatient Prescriptions on File Prior to Visit   Medication Sig Dispense Refill   ??? gabapentin (NEURONTIN) 300 mg capsule TAKE 1 CAPSULE BY MOUTH THREE TIMES DAILY. GENERIC FOR NEURONTIN. (Patient taking differently: TAKE 1 CAPSULE BY MOUTH qhs GENERIC FOR NEURONTIN.) 270 Cap 1   ??? omeprazole (PRILOSEC) 40 mg capsule TAKE ONE CAPSULE BY MOUTH DAILY 90 Cap 3   ??? sodium fluoride (CLINPRO 5000) 1.1 % pste by Dental route two (2) times a day.     ??? lisinopril (PRINIVIL) 20 mg tablet Take 1 Tab by mouth two (2) times a day. 180 Tab 3   ??? clonazePAM (KLONOPIN) 0.5 mg tablet Take 1 Tab by mouth two (2) times a day. 180 Tab 1    ??? atorvastatin (LIPITOR) 20 mg tablet TAKE 1 TABLET BY MOUTH EVERY DAY 90 Tab 1   ??? glucagon (GLUCAGON EMERGENCY KIT, HUMAN,) 1 mg injection 1 mg by IntraMUSCular route as needed (low blood sugar). 3 Kit 1   ??? clopidogrel (PLAVIX) 75 mg tablet Take 1 tablet by mouth daily. 90 tablet 3   ??? HUMALOG 100 unit/mL injection USE AS DIRECTED WITH PUMP 180 mL 12   ??? ascorbic acid (VITAMIN C) 1,000 mg tablet Take  by mouth daily.     ??? multivitamins-minerals-lutein (CENTRUM SILVER) Tab Take  by mouth daily.     ??? vitamin e (E GEMS) 1,000 unit capsule Take 1,000 Units by mouth daily.       ??? Cholecalciferol, Vitamin D3, 5,000 unit Tab Take  by mouth daily.     ??? omega-3 fatty acids-vitamin e (FISH OIL) 1,000 mg cap Take 1 Cap by mouth daily.     ??? coenzyme q10 10 mg cap Take  by mouth two (2) times a day.       No current facility-administered medications on file prior to visit.        No Known Allergies    Objective:  BP 108/58 mmHg   Pulse 76   Temp(Src) 98.1 ??F (36.7 ??C) (Oral)   Resp 18   Ht '5\' 8"'  (1.727 m)   Wt 177 lb (80.287 kg)   BMI 26.92 kg/m2   SpO2 98%   Constitutional: Well developed, nourished, no distress, alert   CV: S1, S2.  RRR.  No murmurs/rubs.  No thrills palpated.  No carotid bruits.  Intact distal pulses.  No edema.   Pulm: No abnormalities on inspection.  Clear to auscultation bilaterally. No wheezing/rhonchi.  Normal effort.     Neuro: A/O x 3. No focal motor or sensory deficits. Speech normal.   Psych: Appropriate affect, judgement and insight.  Short-term memory intact.       Diabetic foot exam:     Bilat:  Filament test normal sensation with micro filament   Pulse DP: 2+ (normal)   Deformities: None    Labwork and Ancillary Studies:    CBC w/Diff  Lab Results   Component Value Date/Time    WBC 9.1 05/12/2014 05:43 AM    HGB 13.7 05/12/2014 05:43 AM    PLATELET 211 05/12/2014 05:43 AM         Basic Metabolic Profile/LFTs  Lab Results   Component Value Date/Time    SODIUM 138 05/12/2014 05:43 AM  POTASSIUM 3.9 05/12/2014 05:43 AM    CHLORIDE 105 05/12/2014 05:43 AM    CO2 29 05/12/2014 05:43 AM    CO2, TOTAL 26 05/11/2014 06:38 PM    GLUCOSE 151 05/12/2014 05:43 AM    BUN 13 05/12/2014 05:43 AM    CREATININE 0.9 05/12/2014 05:43 AM    BUN/CREATININE RATIO 15 04/12/2014 09:17 AM    GFR EST AA >60 05/12/2014 05:43 AM    GFR EST NON-AA >60 05/12/2014 05:43 AM    CALCIUM 7.5 05/12/2014 05:43 AM      Lab Results   Component Value Date/Time    ALT 38 05/12/2014 05:43 AM    AST 25 05/12/2014 05:43 AM    ALK. PHOSPHATASE 65 05/12/2014 05:43 AM    BILIRUBIN, TOTAL 0.8 05/12/2014 05:43 AM       Cholesterol  Lab Results   Component Value Date/Time    CHOLESTEROL, TOTAL 136 04/12/2014 09:17 AM    HDL CHOLESTEROL 46 04/12/2014 09:17 AM    LDL, CALCULATED 74 04/12/2014 09:17 AM    TRIGLYCERIDE 80 04/12/2014 09:17 AM

## 2014-06-21 NOTE — Progress Notes (Signed)
Pt in today for HTN, DM concerns. 1. Have you been to the ER, urgent care clinic since your last visit?  Hospitalized [facility] for [facility] pain    2. Have you seen or consulted any other health care providers outside of the [facility] since your last visit?  Include any pap smears or colon screening. No

## 2014-07-18 MED ORDER — CLOPIDOGREL 75 MG TAB
75 mg | ORAL_TABLET | ORAL | Status: DC
Start: 2014-07-18 — End: 2015-01-12

## 2014-08-19 MED ORDER — ATORVASTATIN 20 MG TAB
20 mg | ORAL_TABLET | ORAL | Status: DC
Start: 2014-08-19 — End: 2015-05-09

## 2014-09-07 ENCOUNTER — Telehealth

## 2014-09-09 MED ORDER — CLONAZEPAM 0.5 MG TAB
0.5 mg | ORAL_TABLET | Freq: Two times a day (BID) | ORAL | Status: DC
Start: 2014-09-09 — End: 2015-03-06

## 2014-09-09 NOTE — Telephone Encounter (Signed)
From: [name]  To: Maurice PasseySusan Cesia Orf [name], Maurice [name]  Sent: 09/07/2014 12:10 PM EST  Subject:  Medication Renewal Request    Original  authorizing provider: [name], Maurice [name]    Clydie BraunBrady  Maurice [name] would like a refill of the following medications:  clonazePAM  (KLONOPIN) 0.5 mg tablet Maurice Passey[[name] [name], Maurice [name]]    [facility]  pharmacy[facility] [id number] - [place], [place] - [address] [facility] [address] [address]  [place]    Comment:

## 2014-09-09 NOTE — Telephone Encounter (Signed)
Prescription(s) called in to pharmacy.

## 2014-09-09 NOTE — Telephone Encounter (Signed)
Please phone in klonopin.

## 2014-09-27 ENCOUNTER — Ambulatory Visit: Admit: 2014-09-27 | Discharge: 2014-09-27 | Payer: PRIVATE HEALTH INSURANCE | Attending: Internal Medicine

## 2014-09-27 DIAGNOSIS — E1040 Type 1 diabetes mellitus with diabetic neuropathy, unspecified: Secondary | ICD-10-CM

## 2014-09-27 LAB — AMB POC URINE, MICROALBUMIN, SEMIQUANTITATIVE
Microalbumin urine (POC): 10 mg/L
Microalbumin/creat ratio (POC): <30 mg/g

## 2014-09-27 LAB — AMB POC HEMOGLOBIN A1C: Hemoglobin A1c (POC): 7 %

## 2014-09-27 NOTE — Patient Instructions (Signed)
Diabetes Foot Health: Care Instructions  Your Care Instructions     When you have diabetes, your feet need extra care and attention. Diabetes can damage the nerve endings and blood vessels in your feet, making you less likely to notice when your feet are injured. Diabetes also limits your body's ability to fight infection and get blood to areas that need it. If you get a minor foot injury, it could become an ulcer or a serious infection. With good foot care, you can prevent most of these problems.  Caring for your feet can be quick and easy. Most of the care can be done when you are bathing or getting ready for bed.  Follow-up care is a key part of your treatment and safety. Be sure to make and go to all appointments, and call your doctor if you are having problems. It???s also a good idea to know your test results and keep a list of the medicines you take.  How can you care for yourself at home?  ?? Keep your blood sugar close to normal by watching what and how much you eat, monitoring blood sugar, taking medicines if prescribed, and getting regular exercise.  ?? Do not smoke. Smoking affects blood flow and can make foot problems worse. If you need help quitting, talk to your doctor about stop-smoking programs and medicines. These can increase your chances of quitting for good.  ?? Eat a diet that is low in fats. High fat intake can cause fat to build up in your blood vessels and decrease blood flow.  ?? Inspect your feet daily for blisters, cuts, cracks, or sores. If you cannot see well, use a mirror or have someone help you.  ?? Take care of your feet:  ?? Wash your feet every day. Use warm (not hot) water. Check the water temperature with your wrists or other part of your body, not your feet.  ?? Dry your feet well. Pat them dry. Do not rub the skin on your feet too hard. Dry well between your toes. If the skin on your feet stays moist, bacteria or a fungus can grow, which can lead to infection.   ?? Keep your skin soft. Use moisturizing skin cream to keep the skin on your feet soft and prevent calluses and cracks. But do not put the cream between your toes, and stop using any cream that causes a rash.  ?? Clean underneath your toenails carefully. Do not use a sharp object to clean underneath your toenails. Use the blunt end of a nail file or other rounded tool.  ?? Trim and file your toenails straight across to prevent ingrown toenails. Use a nail clipper, not scissors. Use an emery board to smooth the edges.  ?? Change socks daily. Socks without seams are best, because seams often rub the feet. You can find socks for people with diabetes from specialty catalogs.  ?? Look inside your shoes every day for things like gravel or torn linings, which could cause blisters or sores.  ?? Buy shoes that fit well:  ?? Look for shoes that have plenty of space around the toes. This helps prevent bunions and blisters.  ?? Try on shoes while wearing the kind of socks you will usually wear with the shoes.  ?? Avoid plastic shoes. They may rub your feet and cause blisters. Good shoes should be made of materials that are flexible and breathable, such as leather or cloth.  ?? Break in new shoes slowly by   wearing them for no more than an hour a day for several days. Take extra time to check your feet for red areas, blisters, or other problems after you wear new shoes.  ?? Do not go barefoot. Do not wear sandals, and do not wear shoes with very thin soles. Thin soles are easy to puncture. They also do not protect your feet from hot pavement or cold weather.  ?? Have your doctor check your feet during each visit. If you have a foot problem, see your doctor. Do not try to treat an early foot problem at home. Home remedies or treatments that you can buy without a prescription (such as corn removers) can be harmful.  ?? Always get early treatment for foot problems. A minor irritation can lead to a major problem if not properly cared for early.   When should you call for help?  Call your doctor now or seek immediate medical care if:  ?? You have a foot sore, an ulcer or break in the skin that is not healing after 4 days, bleeding corns or calluses, or an ingrown toenail.  ?? You have blue or black areas, which can mean bruising or blood flow problems.  ?? You have peeling skin or tiny blisters between your toes or cracking or oozing of the skin.  ?? You have a fever for more than 24 hours and a foot sore.  ?? You have new numbness or tingling in your feet that does not go away after you move your feet or change positions.  ?? You have unexplained or unusual swelling of the foot or ankle.  Watch closely for changes in your health, and be sure to contact your doctor if:  ?? You cannot do proper foot care.   Where can you learn more?   Go to http://www.healthwise.net/[facility]  Enter A739 in the search box to learn more about "Diabetes Foot Health: Care Instructions."   ?? 2006-2015 Healthwise, Inc[employer]. Care instructions adapted under license by [facility] (which disclaims liability or warranty for this information). This care instruction is for use with your licensed healthcare professional. If you have questions about a medical condition or this instruction, always ask your healthcare professional. Healthwise, Incorporated disclaims any warranty or liability for your use of this information.  Content Version: 10.7.482551; Current as of: Jan 04, 2014

## 2014-09-27 NOTE — Progress Notes (Signed)
[name] is a 59 y.o. male  Patient here for follow up for diabetes.        1. Have you been to the ER, urgent care clinic since your last visit?  Hospitalized since your last visit?No    2. Have you seen or consulted any other health care providers outside of the [facility] since your last visit?  Include any pap smears or colon screening. No

## 2014-09-27 NOTE — Progress Notes (Signed)
Assessment/Plan:    1. Type 1 diabetes mellitus with diabetic neuropathy ([place])- A1c 7.0  -continue current regimen.  Will fax results to Dr. Alvira Monday.  - AMB POC HEMOGLOBIN A1C  - AMB POC URINE, MICROALBUMIN, SEMIQUANTITATIVE    2. Benign hypertension  -controlled.  Cont current regimen.    The plan was discussed with the patient.  The patient verbalized understanding and is in agreement with the plan.  All medication potential side effects were discussed with the patient.    Health Maintenance:   Health Maintenance   Topic Date Due   ??? Hepatitis C Screening  January 23, 1956   ??? INFLUENZA AGE 39 TO ADULT  03/17/2015   ??? HEMOGLOBIN A1C Q6M  03/28/2015   ??? LIPID PANEL Q1  04/13/2015   ??? EYE EXAM RETINAL OR DILATED Q1  05/03/2015   ??? FOOT EXAM Q1  06/22/2015   ??? MICROALBUMIN Q1  09/28/2015   ??? COLONOSCOPY  08/15/2018   ??? Td Q 10 Yrs Age > 18  04/12/2024   ??? Tdap Age > 18  Completed       Maurice [name] is a 59 y.o. male and presents with Diabetes     Subjective:  DMI - notes he's been having lots of highs.  Managed by Dr. Alvira Monday.  A1c today 7.0    HTN - controlled.  No side effects.    Pt feels well overall.    ROS:  Constitutional: No recent weight change. No weakness/fatigue.  No f/c.   Cardiovascular: No CP/palpitations.  No DOE/orthopnea/PND.   Respiratory: No cough/sputum, dyspnea, wheezing.   Gastointestinal: No dysphagia, reflux.  No n/v.  No constipation/diarrhea.  No melena/rectal bleeding.   Neurological: No seizures/numbness/weakness.  No paresthesias.     The problem list was updated as a part of today's visit.  Patient Active Problem List   Diagnosis Code   ??? Benign hypertension I10   ??? 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 Diabetic neuropathy (HCC) E11.40   ??? GERD (gastroesophageal reflux disease) K21.9   ??? History of nephrolithiasis Z87.442   ??? Insulin pump in place Z96.41   ??? 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 Diplopia H53.2   ??? 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 Encephalomalacia on imaging study G93.89   ??? History of stroke Z86.73   ??? 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 Pure hypercholesterolemia E78.0   ??? 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    SH:  History   Substance Use Topics   ??? Smoking status: Never Smoker    ??? Smokeless tobacco: Never Used   ??? Alcohol Use: 1.5 oz/week     3 Cans of beer per week       Medications/Allergies:  Current Outpatient Prescriptions on File Prior to Visit   Medication Sig Dispense Refill   ??? clonazePAM (KLONOPIN) 0.5 mg tablet Take 1 Tab by mouth two (2) times a day. 180 Tab 1   ??? atorvastatin (LIPITOR) 20 mg tablet TAKE ONE TABLET BY MOUTH DAILY 90 Tab 2   ??? clopidogrel (PLAVIX) 75 mg tablet TAKE 1 TABLET BY MOUTH DAILY 90 Tab 1   ??? timolol (TIMOPTIC) 0.5 % ophthalmic solution 1 Drop two (2) times a day.     ???  Alpha Lipoic Acid 200 mg tab Take  by mouth daily.     ??? gabapentin (NEURONTIN) 300 mg capsule TAKE 1 CAPSULE BY MOUTH qhs GENERIC FOR NEURONTIN. 90 Cap 1   ??? omeprazole (PRILOSEC) 40 mg capsule TAKE ONE CAPSULE BY MOUTH DAILY 90 Cap 3   ??? sodium fluoride (CLINPRO 5000) 1.1 % pste by Dental route two (2) times a day.     ??? lisinopril (PRINIVIL) 20 mg tablet Take 1 Tab by mouth two (2) times a day. 180 Tab 3   ??? glucagon (GLUCAGON EMERGENCY KIT, HUMAN,) 1 mg injection 1 mg by IntraMUSCular route as needed (low blood sugar). 3 Kit 1   ??? HUMALOG 100 unit/mL injection USE AS DIRECTED WITH PUMP 180 mL 12   ??? ascorbic acid (VITAMIN C) 1,000 mg tablet Take  by mouth daily.     ??? multivitamins-minerals-lutein (CENTRUM SILVER) Tab Take  by mouth daily.     ??? vitamin e (E GEMS) 1,000 unit capsule Take 1,000 Units by mouth daily.       ??? Cholecalciferol, Vitamin D3, 5,000 unit Tab Take  by mouth daily.      ??? omega-3 fatty acids-vitamin e (FISH OIL) 1,000 mg cap Take 1 Cap by mouth daily.     ??? coenzyme q10 10 mg cap Take  by mouth two (2) times a day.       No current facility-administered medications on file prior to visit.        No Known Allergies    Objective:  BP 128/80 mmHg   Pulse 91   Temp(Src) 98.3 ??F (36.8 ??C) (Oral)   Resp 20   Ht '5\' 8"'  (1.727 m)   Wt 178 lb (80.74 kg)   BMI 27.07 kg/m2   SpO2 98%   Constitutional: Well developed, nourished, no distress, alert   CV: S1, S2.  RRR.  No murmurs/rubs.  No thrills palpated.  No carotid bruits.  Intact distal pulses.  No edema.   Pulm: No abnormalities on inspection.  Clear to auscultation bilaterally. No wheezing/rhonchi.  Normal effort.     GI: Soft, nontender, nondistended.  Normal active bowel sounds.    Neuro: A/O x 3. No focal motor or sensory deficits. Speech normal.   Psych: Appropriate affect, judgement and insight.  Short-term memory intact.       Labwork and Ancillary Studies:    CBC w/Diff  Lab Results   Component Value Date/Time    WBC 9.1 05/12/2014 05:43 AM    HGB 13.7 05/12/2014 05:43 AM    PLATELET 211 05/12/2014 05:43 AM         Basic Metabolic Profile/LFTs  Lab Results   Component Value Date/Time    SODIUM 138 05/12/2014 05:43 AM    POTASSIUM 3.9 05/12/2014 05:43 AM    CHLORIDE 105 05/12/2014 05:43 AM    CO2 29 05/12/2014 05:43 AM    CO2, TOTAL 26 05/11/2014 06:38 PM    GLUCOSE 151 05/12/2014 05:43 AM    BUN 13 05/12/2014 05:43 AM    CREATININE 0.9 05/12/2014 05:43 AM    BUN/CREATININE RATIO 15 04/12/2014 09:17 AM    GFR EST AA >60 05/12/2014 05:43 AM    GFR EST NON-AA >60 05/12/2014 05:43 AM    CALCIUM 7.5 05/12/2014 05:43 AM      Lab Results   Component Value Date/Time    ALT 38 05/12/2014 05:43 AM    AST 25 05/12/2014 05:43 AM    ALK. PHOSPHATASE 65  05/12/2014 05:43 AM    BILIRUBIN, TOTAL 0.8 05/12/2014 05:43 AM       Cholesterol  Lab Results   Component Value Date/Time    CHOLESTEROL, TOTAL 136 04/12/2014 09:17 AM     HDL CHOLESTEROL 46 04/12/2014 09:17 AM    LDL, CALCULATED 74 04/12/2014 09:17 AM    TRIGLYCERIDE 80 04/12/2014 09:17 AM

## 2015-01-03 ENCOUNTER — Ambulatory Visit: Admit: 2015-01-03 | Discharge: 2015-01-03 | Payer: PRIVATE HEALTH INSURANCE | Attending: Internal Medicine

## 2015-01-03 DIAGNOSIS — E1040 Type 1 diabetes mellitus with diabetic neuropathy, unspecified: Secondary | ICD-10-CM

## 2015-01-03 LAB — AMB POC HEMOGLOBIN A1C: Hemoglobin A1c (POC): 7.3 %

## 2015-01-03 MED ORDER — LISINOPRIL 20 MG TAB
20 mg | ORAL_TABLET | ORAL | Status: DC
Start: 2015-01-03 — End: 2015-03-12

## 2015-01-03 NOTE — Progress Notes (Signed)
Clydie BraunBrady Sherr is a 59 y.o. male  Pt here today for follow up visit.    1. Have you been to the ER, urgent care clinic since your last visit?  Hospitalized since your last visit?No    2. Have you seen or consulted any other health care providers outside of the [facility] since your last visit?  Include any pap smears or colon screening. Yes Dr. Berton BonSalib and Endo

## 2015-01-03 NOTE — Progress Notes (Signed)
Assessment/Plan:    1. Type 1 diabetes mellitus with diabetic neuropathy (HCC)  - AMB POC HEMOGLOBIN A1C, A1c 7.3    2. Benign hypertension  -pt to take 1/2 tab in am and 1 tab in pm to see if this helps with dizzy episode.  -pt to track bp at home and let me know what the readings are.    3. Dizziness  -may be related to low bp?    4. Side pain  -suspect related to GI/gas.    The plan was discussed with the patient.  The patient verbalized understanding and is in agreement with the plan.  All medication potential side effects were discussed with the patient.    Health Maintenance:   Health Maintenance   Topic Date Due   ??? Hepatitis C Screening  05-25-1956   ??? Pneumococcal 19-64 Medium Risk (1 of 1 - PPSV23) 09/25/1974   ??? INFLUENZA AGE 52 TO ADULT  03/17/2015   ??? HEMOGLOBIN A1C Q6M  03/28/2015   ??? FOOT EXAM Q1  06/22/2015   ??? MICROALBUMIN Q1  09/28/2015   ??? LIPID PANEL Q1  10/13/2015   ??? EYE EXAM RETINAL OR DILATED Q1  12/06/2015   ??? COLONOSCOPY  08/15/2018   ??? DTaP/Tdap/Td series (2 - Td) 04/12/2024       Maurice [name] is a 59 y.o. male and presents with Follow-up     Subjective:  Notes a few weeks ago, he got a sharp pain in L side pain.  Has h/o kidney stones.  Pain was colicky in nature, but he never passed the stone.  The pain has resolved.  No dysuria or hematuria.  Also felt bloated at this time and had some abdominal distention.  Notes more flatulence.    DMI - A1c 7.3. On pump.      Notes his vision is worsening.  Had eye treatment last month in L eye w/o improvement.  Has f/u next week.  States he can't focus out of L eye at all.  His ability to read has gotten worse, which is affecting his work.    Notes some lightheadedness.  Not associated with hypoglycemia.  Happens while sitting.  States it feels like he just stepped off a carnival ride.  Denies room spinning.  Lasts only minutes. His bp does run low normal.    ROS:  Constitutional: No recent weight change. No weakness/fatigue.  No f/c.    Skin: No rashes, change in nails/hair, itching   HENT: No HA, +dizziness. No hearing loss/tinnitus.  No nasal congestion/discharge.   Eyes: + change in vision, +blurred vision    Cardiovascular: No CP/palpitations.  No DOE/orthopnea/PND.   Respiratory: No cough/sputum, dyspnea, wheezing.   Gastointestinal: No dysphagia, reflux.  No n/v.  No constipation/diarrhea.  No melena/rectal bleeding.   Genitourinary: No dysuria, urinary hesitancy, nocturia, hematuria.  No incontinence.   Musculoskeletal: No joint pain/stiffness.  No muscle pain/tenderness.     The problem list was updated as a part of today's visit.  Patient Active Problem List   Diagnosis Code   ??? Benign hypertension I10   ??? 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 Diabetic neuropathy (HCC) E11.40   ??? GERD (gastroesophageal reflux disease) K21.9   ??? History of nephrolithiasis Z87.442   ??? Insulin pump in place Z96.41   ??? 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 Diplopia H53.2   ??? 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  Encephalomalacia on imaging study G93.89   ??? History of stroke Z86.73   ??? 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 Pure hypercholesterolemia E78.0   ??? Diabetes mellitus type 1 with neurological manifestations (HCC) E10.49     The PSH, FH were reviewed.    SH:  History   Substance Use Topics   ??? Smoking status: Never Smoker    ??? Smokeless tobacco: Never Used   ??? Alcohol Use: 1.5 oz/week     3 Cans of beer per week       Medications/Allergies:  Current Outpatient Prescriptions on File Prior to Visit   Medication Sig Dispense Refill   ??? olopatadine 0.7 % drop Apply  to eye.     ??? clonazePAM (KLONOPIN) 0.5 mg tablet Take 1 Tab by mouth two (2) times a day. 180 Tab 1   ??? atorvastatin (LIPITOR) 20 mg tablet TAKE ONE TABLET BY MOUTH DAILY 90 Tab 2    ??? clopidogrel (PLAVIX) 75 mg tablet TAKE 1 TABLET BY MOUTH DAILY 90 Tab 1   ??? timolol (TIMOPTIC) 0.5 % ophthalmic solution 1 Drop two (2) times a day.     ??? Alpha Lipoic Acid 200 mg tab Take  by mouth daily.     ??? gabapentin (NEURONTIN) 300 mg capsule TAKE 1 CAPSULE BY MOUTH qhs GENERIC FOR NEURONTIN. 90 Cap 1   ??? omeprazole (PRILOSEC) 40 mg capsule TAKE ONE CAPSULE BY MOUTH DAILY 90 Cap 3   ??? sodium fluoride (CLINPRO 5000) 1.1 % pste by Dental route two (2) times a day.     ??? lisinopril (PRINIVIL) 20 mg tablet Take 1 Tab by mouth two (2) times a day. 180 Tab 3   ??? glucagon (GLUCAGON EMERGENCY KIT, HUMAN,) 1 mg injection 1 mg by IntraMUSCular route as needed (low blood sugar). 3 Kit 1   ??? HUMALOG 100 unit/mL injection USE AS DIRECTED WITH PUMP 180 mL 12   ??? ascorbic acid (VITAMIN C) 1,000 mg tablet Take  by mouth daily.     ??? multivitamins-minerals-lutein (CENTRUM SILVER) Tab Take  by mouth daily.     ??? vitamin e (E GEMS) 1,000 unit capsule Take 1,000 Units by mouth daily.       ??? Cholecalciferol, Vitamin D3, 5,000 unit Tab Take  by mouth daily.     ??? omega-3 fatty acids-vitamin e (FISH OIL) 1,000 mg cap Take 1 Cap by mouth daily.     ??? coenzyme q10 10 mg cap Take  by mouth two (2) times a day.       No current facility-administered medications on file prior to visit.        No Known Allergies    Objective:  BP 100/70 mmHg   Pulse 61   Temp(Src) 97.7 ??F (36.5 ??C) (Oral)   Resp 18   Ht '5\' 8"'  (1.727 m)   Wt 179 lb (81.194 kg)   BMI 27.22 kg/m2   SpO2 96%   Constitutional: Well developed, nourished, no distress, alert   CV: S1, S2.  RRR.  No murmurs/rubs.  No thrills palpated.  No carotid bruits.  Intact distal pulses.  No edema.   Pulm: No abnormalities on inspection.  Clear to auscultation bilaterally. No wheezing/rhonchi.  Normal effort.     GI: Soft, nontender, nondistended.  Normal active bowel sounds. No CVA tenderness.   Neuro: A/O x 3. No focal motor or sensory  deficits. Speech normal.      Labwork and Ancillary Studies:    CBC w/Diff  Lab Results   Component Value Date/Time    WBC 9.1 05/12/2014 05:43 AM    HGB 13.7 05/12/2014 05:43 AM    PLATELET 211 05/12/2014 05:43 AM         Basic Metabolic Profile/LFTs  Lab Results   Component Value Date/Time    SODIUM 138 05/12/2014 05:43 AM    POTASSIUM 3.9 05/12/2014 05:43 AM    CHLORIDE 105 05/12/2014 05:43 AM    CO2 29 05/12/2014 05:43 AM    CO2, TOTAL 26 05/11/2014 06:38 PM    GLUCOSE 151 05/12/2014 05:43 AM    BUN 13 05/12/2014 05:43 AM    CREATININE 0.9 05/12/2014 05:43 AM    BUN/CREATININE RATIO 15 04/12/2014 09:17 AM    GFR EST AA >60 05/12/2014 05:43 AM    GFR EST NON-AA >60 05/12/2014 05:43 AM    CALCIUM 7.5 05/12/2014 05:43 AM      Lab Results   Component Value Date/Time    ALT 38 05/12/2014 05:43 AM    AST 25 05/12/2014 05:43 AM    ALK. PHOSPHATASE 65 05/12/2014 05:43 AM    BILIRUBIN, TOTAL 0.8 05/12/2014 05:43 AM       Cholesterol  Lab Results   Component Value Date/Time    CHOLESTEROL, TOTAL 136 04/12/2014 09:17 AM    HDL CHOLESTEROL 46 04/12/2014 09:17 AM    LDL, CALCULATED 74 04/12/2014 09:17 AM    TRIGLYCERIDE 80 04/12/2014 09:17 AM

## 2015-01-14 MED ORDER — CLOPIDOGREL 75 MG TAB
75 mg | ORAL_TABLET | ORAL | Status: DC
Start: 2015-01-14 — End: 2015-12-28

## 2015-03-06 ENCOUNTER — Encounter

## 2015-03-06 MED ORDER — CLONAZEPAM 0.5 MG TAB
0.5 mg | ORAL_TABLET | Freq: Two times a day (BID) | ORAL | Status: DC
Start: 2015-03-06 — End: 2015-09-03

## 2015-03-11 ENCOUNTER — Encounter

## 2015-03-11 MED ORDER — GLUCAGON (HUMAN RECOMBINANT) 1 MG INJECTION KIT
1 mg | PACK | INTRAMUSCULAR | Status: DC | PRN
Start: 2015-03-11 — End: 2016-06-26

## 2015-03-12 MED ORDER — LISINOPRIL 20 MG TAB
20 mg | ORAL_TABLET | ORAL | Status: DC
Start: 2015-03-12 — End: 2015-09-17

## 2015-04-10 MED ORDER — OMEPRAZOLE 40 MG CAP, DELAYED RELEASE
40 mg | ORAL_CAPSULE | ORAL | 0 refills | Status: DC
Start: 2015-04-10 — End: 2015-04-14

## 2015-04-11 ENCOUNTER — Ambulatory Visit: Admit: 2015-04-11 | Discharge: 2015-04-11 | Payer: PRIVATE HEALTH INSURANCE | Attending: Internal Medicine

## 2015-04-11 DIAGNOSIS — Z01810 Encounter for preprocedural cardiovascular examination: Secondary | ICD-10-CM

## 2015-04-11 NOTE — Progress Notes (Signed)
1. Have you been to the ER, urgent care clinic since your last visit?  Hospitalized since your last visit?No    2. Have you seen or consulted any other health care providers outside of the [facility] since your last visit?  Include any pap smears or colon screening.  Patient has seen his retina specialist. Patient has seen endocrinologist. Patient has seen opthalmologist. .

## 2015-04-11 NOTE — Progress Notes (Signed)
Flu shot Immunization/s administered 04/11/2015 by Shenoa Hattabaugh J. Glenetta Hew, LPN with consent.    Patient tolerated procedure well.  No reactions noted.

## 2015-04-11 NOTE — Progress Notes (Signed)
Maurice [name] is a 59 y.o. male and presents for pre-operative evaluation.    Subjective:  This patient was seen at the request of Dr. Thomos Lemons for pre-operative evaluation for planned procedure: CAT SX Os then OD on 9/12 and 05/12/15 under MAC/topical anesthesia.    Cardiac factors include:  DMI, A1c 7.3 on 01/03/15  HTN, controlled    METs: 5    ROS:  Constitutional: No recent weight change. No weakness/fatigue.  No f/c.   Skin: No rashes, change in nails/hair, itching   HENT: No HA, dizziness. No hearing loss/tinnitus.  No nasal congestion/discharge.   Eyes: No change in vision, double/blurred vision or eye pain/redness.    Cardiovascular: No CP/palpitations.  No DOE/orthopnea/PND.   Respiratory: No cough/sputum, dyspnea, wheezing.   Gastointestinal: No dysphagia, reflux.  No n/v.  No constipation/diarrhea.  No melena/rectal bleeding.   Genitourinary: No dysuria, urinary hesitancy, nocturia, hematuria.  No incontinence.   Musculoskeletal: No joint pain/stiffness.  No muscle pain/tenderness.   Heme: No h/o anemia.  No easy bleeding/bruising.   Neurological: No seizures/numbness/weakness.  No paresthesias.     PMH:  Patient Active Problem List   Diagnosis Code   ??? Benign hypertension I10   ??? 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 Diabetic neuropathy (HCC) E11.40   ??? GERD (gastroesophageal reflux disease) K21.9   ??? History of nephrolithiasis Z87.442   ??? Insulin pump in place Z96.41   ??? Diabetic retinopathy ([place]) E11.319   ??? Diplopia H53.2   ??? 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 Encephalomalacia on imaging study G93.89   ??? History of stroke Z86.73   ??? 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 Pure hypercholesterolemia E78.0   ??? 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 Hx carotid endarterectomy  2006   ??? Hx colonoscopy          SH:  Social History   Substance Use Topics   ??? Smoking status: Never Smoker   ??? Smokeless tobacco: Never Used   ??? Alcohol use 1.5 oz/week     3 Cans of beer per week       FH:  Family History   Problem Relation Age of Onset   ??? Cancer Father 75     prostate   ??? Parkinsonism Father    ??? Other Other      parkinson uncle   ??? Cancer Maternal Grandfather      mesothelioma       Medications/Allergies:  Current Outpatient Prescriptions on File Prior to Visit   Medication Sig Dispense Refill   ??? omeprazole (PRILOSEC) 40 mg capsule TAKE ONE CAPSULE BY MOUTH DAILY 30 Cap 0   ??? lisinopril (PRINIVIL, ZESTRIL) 20 mg tablet TAKE 1 TABLET BY MOUTH TWICE DAILY. (Patient taking differently: TAKE 1 TABLET BY MOUTH TWICE DAILY. patient is taking 1/2 tablet in the morning and 1 tablet with dinner.) 180 Tab 1   ??? glucagon (GLUCAGON EMERGENCY KIT, HUMAN,) 1 mg injection 1 mg by IntraMUSCular route as needed (low blood sugar). 3 Kit 1   ??? clonazePAM (KLONOPIN) 0.5 mg tablet Take 1 Tab by mouth two (2) times a day. 180 Tab 1   ??? clopidogrel (PLAVIX) 75 mg tablet TAKE 1  TABLET BY MOUTH DAILY 90 Tab 3   ??? olopatadine 0.7 % drop Apply  to eye.     ??? atorvastatin (LIPITOR) 20 mg tablet TAKE ONE TABLET BY MOUTH DAILY 90 Tab 2   ??? timolol (TIMOPTIC) 0.5 % ophthalmic solution 1 Drop two (2) times a day.     ??? Alpha Lipoic Acid 200 mg tab Take  by mouth daily.     ??? gabapentin (NEURONTIN) 300 mg capsule TAKE 1 CAPSULE BY MOUTH qhs GENERIC FOR NEURONTIN. 90 Cap 1   ??? sodium fluoride (CLINPRO 5000) 1.1 % pste by Dental route two (2) times a day.     ??? HUMALOG 100 unit/mL injection USE AS DIRECTED WITH PUMP 180 mL 12   ??? ascorbic acid (VITAMIN C) 1,000 mg tablet Take  by mouth daily.     ??? multivitamins-minerals-lutein (CENTRUM SILVER) Tab Take  by mouth daily.     ??? vitamin e (E GEMS) 1,000 unit capsule Take 1,000 Units by mouth daily. Patient is taking 400 iu      ??? Cholecalciferol, Vitamin D3, 5,000 unit Tab Take  by mouth daily. Patient is currently taking 2000 iu     ??? omega-3 fatty acids-vitamin e (FISH OIL) 1,000 mg cap Take 1 Cap by mouth daily.     ??? coenzyme q10 10 mg cap Take  by mouth two (2) times a day.       No current facility-administered medications on file prior to visit.       No Known Allergies    Objective:  Visit Vitals   ??? BP 125/78   ??? Pulse 77   ??? Temp 98 ??F (36.7 ??C)   ??? Resp 20   ??? Ht '5\' 8"'  (1.727 m)   ??? Wt 179 lb 6.4 oz (81.4 kg)   ??? SpO2 97%   ??? BMI 27.28 kg/m2      Gen: Well developed, nourished, no distress, alert   CV: S1, S2.  RRR.  No murmurs/rubs.  No thrills palpated.  No carotid bruits.  Intact distal pulses.  No edema.   Pulm: No abnormalities on inspection.  Clear to auscultation bilaterally. No wheezing/rhonchi.  Normal effort.     GI: Soft, nontender, nondistended.  Normal active bowel sounds. No  masses on palpation.  No hepatosplenomegaly.   Psych: Appropriate affect, judgement and insight.  Short-term memory intact.       Labwork and Ancillary Studies:    CBC w/Diff  Lab Results   Component Value Date/Time    WBC 9.1 05/12/2014 05:43 AM    HGB 13.7 05/12/2014 05:43 AM    PLATELET 211 05/12/2014 05:43 AM         Basic Metabolic Profile/LFTs  Lab Results   Component Value Date/Time    SODIUM 138 05/12/2014 05:43 AM    POTASSIUM 3.9 05/12/2014 05:43 AM    CHLORIDE 105 05/12/2014 05:43 AM    CO2 29 05/12/2014 05:43 AM    CO2, TOTAL 26 05/11/2014 06:38 PM    GLUCOSE 151 05/12/2014 05:43 AM    BUN 13 05/12/2014 05:43 AM    CREATININE 0.9 05/12/2014 05:43 AM    BUN/CREATININE RATIO 15 04/12/2014 09:17 AM    GFR EST AA >60 05/12/2014 05:43 AM    GFR EST NON-AA >60 05/12/2014 05:43 AM    CALCIUM 7.5 05/12/2014 05:43 AM      Lab Results   Component Value Date/Time    ALT 38 05/12/2014 05:43 AM  AST 25 05/12/2014 05:43 AM    ALK. PHOSPHATASE 65 05/12/2014 05:43 AM    BILIRUBIN, TOTAL 0.8 05/12/2014 05:43 AM       Cholesterol  Lab Results    Component Value Date/Time    CHOLESTEROL, TOTAL 136 04/12/2014 09:17 AM    HDL CHOLESTEROL 46 04/12/2014 09:17 AM    LDL, CALCULATED 74 04/12/2014 09:17 AM    TRIGLYCERIDE 80 04/12/2014 09:17 AM       Assessment/Plan:    The patient is low risk for planned procedure and may proceed to OR without further testing.    The following recommendations were made for medication regimen prior to surgery:  Continue taking HTN meds prior to surgery.  Monitor blood sugar closely on insulin pump.  May need to turn off pump on the morning of.    I will continue to follow along with the patient's treatment and care.  For any questions please do not hesitate to call the office at [phone].      Arlice Colt, MD

## 2015-04-14 MED ORDER — OMEPRAZOLE 40 MG CAP, DELAYED RELEASE
40 mg | ORAL_CAPSULE | ORAL | 3 refills | Status: DC
Start: 2015-04-14 — End: 2016-04-06

## 2015-04-25 ENCOUNTER — Encounter: Attending: Internal Medicine

## 2015-05-09 MED ORDER — ATORVASTATIN 20 MG TAB
20 mg | ORAL_TABLET | ORAL | 3 refills | Status: DC
Start: 2015-05-09 — End: 2016-04-23

## 2015-05-09 NOTE — Telephone Encounter (Signed)
PA from [facility] needs a statement from you to have patient  stop Plavix today for second eye surgery on 05/12/15.

## 2015-06-20 ENCOUNTER — Ambulatory Visit: Admit: 2015-06-20 | Discharge: 2015-06-20 | Payer: PRIVATE HEALTH INSURANCE | Attending: Internal Medicine

## 2015-06-20 ENCOUNTER — Inpatient Hospital Stay: Admit: 2015-06-20 | Payer: PRIVATE HEALTH INSURANCE

## 2015-06-20 DIAGNOSIS — E1040 Type 1 diabetes mellitus with diabetic neuropathy, unspecified: Secondary | ICD-10-CM

## 2015-06-20 LAB — AMB POC HEMOGLOBIN A1C: Hemoglobin A1c (POC): 7.1 %

## 2015-06-20 MED ORDER — GABAPENTIN 300 MG CAP
300 mg | ORAL_CAPSULE | ORAL | 2 refills | Status: DC
Start: 2015-06-20 — End: 2016-03-10

## 2015-06-20 NOTE — Progress Notes (Signed)
1. Have you been to the ER, urgent care clinic since your last visit?  Hospitalized since your last visit?no    2. Have you seen or consulted any other health care providers outside of the [facility] since your last visit?  Include any pap smears or colon screening. Yes    Dr. Francene BoyersSaliv, Dr Rockney Gheekeke, Dr. Allene Dillonorres      Pain scale 0/10

## 2015-06-20 NOTE — Progress Notes (Signed)
Assessment/Plan:    1. 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 Alvira Monday.  A1c 7.1.  - AMB POC HEMOGLOBIN A1C  - HM DIABETES FOOT EXAM  - METABOLIC PANEL, COMPREHENSIVE; Future    2. Benign hypertension  -controlled.  Cont current.  - METABOLIC PANEL, COMPREHENSIVE; Future    3. Pure hypercholesterolemia  - LIPID PANEL W/ REFLX DIRECT LDL; Future    4. Encounter for immunization  - Pneumococcal polysaccharide vaccine, 23-valent, adult or immunosuppressed pt dose  - PR IMMUNIZ ADMIN,1 SINGLE/COMB VAC/TOXOID    The plan was discussed with the patient.  The patient verbalized understanding and is in agreement with the plan.  All medication potential side effects were discussed with the patient.    Health Maintenance:   Health Maintenance   Topic Date Due   ??? Hepatitis C Screening  [birth year]   ??? MICROALBUMIN Q1  09/28/2015   ??? LIPID PANEL Q1  10/13/2015   ??? HEMOGLOBIN A1C Q6M  12/18/2015   ??? EYE EXAM RETINAL OR DILATED Q1  03/31/2016   ??? FOOT EXAM Q1  06/19/2016   ??? COLONOSCOPY  08/15/2018   ??? DTaP/Tdap/Td series (2 - Td) 04/12/2024   ??? Pneumococcal 19-64 Medium Risk  Completed   ??? INFLUENZA AGE 14 TO ADULT  Completed       Maurice [name] is a 58 y.o. male and presents with Diabetes and Hypertension     Subjective:  DM1 - A1c today is 7.1.  Followed by Dr. Alvira Monday.    HTN - bp controlled. On lisinopril.    ROS:  Constitutional: No recent weight change. No weakness/fatigue.  No f/c.   Cardiovascular: No CP/palpitations.  No DOE/orthopnea/PND.   Respiratory: No cough/sputum, dyspnea, wheezing.   Gastointestinal: No dysphagia, reflux.  No n/v.  No constipation/diarrhea.  No melena/rectal bleeding.   Neurological: No seizures/numbness/weakness.  No paresthesias.     The problem list was updated as a part of today's visit.  Patient Active Problem List   Diagnosis Code   ??? Benign hypertension I10   ??? 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 Diabetic neuropathy (HCC) E11.40    ??? GERD (gastroesophageal reflux disease) K21.9   ??? History of nephrolithiasis Z87.442   ??? Insulin pump in place Z96.41   ??? 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 Diplopia H53.2   ??? 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 Encephalomalacia on imaging study G93.89   ??? History of stroke Z86.73   ??? 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 Branch retinal vein occlusion [id number]   ??? 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 Pure hypercholesterolemia E78.00   ??? 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    SH:  Social History   Substance Use Topics   ??? Smoking status: Never Smoker   ??? Smokeless tobacco: Never Used   ??? Alcohol use 1.5 oz/week     3 Cans of beer per week       Medications/Allergies:  Current Outpatient Prescriptions on File Prior to Visit   Medication Sig Dispense Refill   ??? atorvastatin (LIPITOR) 20 mg tablet TAKE 1 TABLET BY MOUTH DAILY 90 Tab 3   ??? omeprazole (PRILOSEC) 40 mg capsule TAKE ONE CAPSULE BY MOUTH DAILY 90 Cap 3   ??? lisinopril (PRINIVIL, ZESTRIL) 20 mg tablet TAKE  1 TABLET BY MOUTH TWICE DAILY. (Patient taking differently: TAKE 1 TABLET BY MOUTH TWICE DAILY. patient is taking 1/2 tablet in the morning and 1 tablet with dinner.) 180 Tab 1   ??? glucagon (GLUCAGON EMERGENCY KIT, HUMAN,) 1 mg injection 1 mg by IntraMUSCular route as needed (low blood sugar). 3 Kit 1   ??? clonazePAM (KLONOPIN) 0.5 mg tablet Take 1 Tab by mouth two (2) times a day. 180 Tab 1   ??? clopidogrel (PLAVIX) 75 mg tablet TAKE 1 TABLET BY MOUTH DAILY 90 Tab 3   ??? timolol (TIMOPTIC) 0.5 % ophthalmic solution 1 Drop two (2) times a day.     ??? Alpha Lipoic Acid 200 mg tab Take  by mouth daily.     ??? gabapentin (NEURONTIN) 300 mg capsule TAKE 1 CAPSULE BY MOUTH qhs GENERIC FOR NEURONTIN. 90 Cap 1   ??? HUMALOG 100 unit/mL injection USE AS DIRECTED WITH PUMP 180 mL 12    ??? ascorbic acid (VITAMIN C) 1,000 mg tablet Take  by mouth daily.     ??? multivitamins-minerals-lutein (CENTRUM SILVER) Tab Take  by mouth daily.     ??? vitamin e (E GEMS) 1,000 unit capsule Take 1,000 Units by mouth daily. Patient is taking 400 iu     ??? Cholecalciferol, Vitamin D3, 5,000 unit Tab Take  by mouth daily. Patient is currently taking 2000 iu     ??? omega-3 fatty acids-vitamin e (FISH OIL) 1,000 mg cap Take 1 Cap by mouth daily.     ??? coenzyme q10 10 mg cap Take  by mouth two (2) times a day.     ??? olopatadine 0.7 % drop Apply  to eye.     ??? sodium fluoride (CLINPRO 5000) 1.1 % pste by Dental route two (2) times a day.       No current facility-administered medications on file prior to visit.         No Known Allergies    Objective:  Visit Vitals   ??? BP 128/78 (BP 1 Location: Left arm, BP Patient Position: Sitting)   ??? Pulse 87   ??? Temp 98.2 ??F (36.8 ??C) (Oral)   ??? Resp 18   ??? Ht '5\' 8"'  (1.727 m)   ??? Wt 177 lb (80.3 kg)   ??? SpO2 97%   ??? BMI 26.91 kg/m2      Constitutional: Well developed, nourished, no distress, alert   CV: S1, S2.  RRR.  No murmurs/rubs.  No thrills palpated.  No carotid bruits.  Intact distal pulses.  No edema.   Pulm: No abnormalities on inspection.  Clear to auscultation bilaterally. No wheezing/rhonchi.  Normal effort.     GI: Soft, nontender, nondistended.  Normal active bowel sounds.    Neuro: A/O x 3. No focal motor or sensory deficits. Speech normal.   Psych: Appropriate affect, judgement and insight.  Short-term memory intact.     Diabetic foot exam:     bilat:  Filament test normal sensation with micro filament

## 2015-06-21 LAB — METABOLIC PANEL, COMPREHENSIVE
A-G Ratio: 1.7 (ref 0.8–1.7)
ALT (SGPT): 35 U/L (ref 16–61)
AST (SGOT): 18 U/L (ref 15–37)
Albumin: 4.4 g/dL (ref 3.4–5.0)
Alk. phosphatase: 96 U/L (ref 45–117)
Anion gap: 6 mmol/L (ref 3.0–18)
BUN/Creatinine ratio: 19 (ref 12–20)
BUN: 19 MG/DL — ABNORMAL HIGH (ref 7.0–18)
Bilirubin, total: 0.4 MG/DL (ref 0.2–1.0)
CO2: 31 mmol/L (ref 21–32)
Calcium: 9.2 MG/DL (ref 8.5–10.1)
Chloride: 102 mmol/L (ref 100–108)
Creatinine: 0.99 MG/DL (ref 0.6–1.3)
GFR est AA: >60 mL/min/{1.73_m2} (ref >60)
GFR est non-AA: >60 mL/min/{1.73_m2} (ref >60)
Globulin: 2.6 g/dL (ref 2.0–4.0)
Glucose: 169 mg/dL — ABNORMAL HIGH (ref 74–99)
Potassium: 4.8 mmol/L (ref 3.5–5.5)
Protein, total: 7 g/dL (ref 6.4–8.2)
Sodium: 139 mmol/L (ref 136–145)

## 2015-06-21 LAB — LIPID PANEL W/ REFLX DIRECT LDL
CHOL/HDL Ratio: 2.9 (ref 0–5.0)
Cholesterol, total: 152 MG/DL (ref <200)
HDL Cholesterol: 53 MG/DL (ref 40–60)
LDL, calculated: 68.4 MG/DL (ref 0–100)
Triglyceride: 153 MG/DL — ABNORMAL HIGH (ref <150)
VLDL, calculated: 30.6 MG/DL

## 2015-09-03 ENCOUNTER — Telehealth

## 2015-09-04 MED ORDER — CLONAZEPAM 0.5 MG TAB
0.5 mg | ORAL_TABLET | Freq: Two times a day (BID) | ORAL | 1 refills | Status: DC
Start: 2015-09-04 — End: 2016-02-26

## 2015-09-04 NOTE — Telephone Encounter (Signed)
From: Maurice [name]  To: Daine Gravel, MD  Sent: 09/03/2015 9:06 PM EST  Subject:  Medication Renewal Request    Original  authorizing provider: Daine Gravel, MD    Maurice [name] would like a refill of the following medications:  clonazePAM  (KLONOPIN) 0.5 mg tablet Daine Gravel, MD]    Preferred  pharmacy: [id number] - [place], [place] - [address] AT [facility] [address]  [place]    Comment:

## 2015-09-04 NOTE — Telephone Encounter (Signed)
Phoned in.

## 2015-09-18 MED ORDER — LISINOPRIL 20 MG TAB
20 mg | ORAL_TABLET | ORAL | 2 refills | Status: DC
Start: 2015-09-18 — End: 2016-06-06

## 2015-09-22 ENCOUNTER — Encounter: Admit: 2015-09-22

## 2015-09-22 ENCOUNTER — Ambulatory Visit: Admit: 2015-09-22 | Payer: PRIVATE HEALTH INSURANCE | Attending: Family Medicine

## 2015-09-22 DIAGNOSIS — J22 Unspecified acute lower respiratory infection: Secondary | ICD-10-CM

## 2015-09-22 MED ORDER — HYDROCODONE 10 MG-CHLORPHENIRAMINE 8 MG/5 ML ORAL SUSP EXTEND.REL 12HR
10-8 mg/5 mL | Freq: Two times a day (BID) | ORAL | 0 refills | Status: DC | PRN
Start: 2015-09-22 — End: 2016-01-02

## 2015-09-22 MED ORDER — AZITHROMYCIN 250 MG TAB
250 mg | ORAL_TABLET | ORAL | 0 refills | Status: AC
Start: 2015-09-22 — End: 2015-09-27

## 2015-09-22 NOTE — Progress Notes (Signed)
HISTORY OF PRESENT ILLNESS  Maurice [name] is a 60 y.o. male.  Cold Symptoms   The history is provided by the patient and medical records. This is a new problem. Episode onset: 3-4 days ago. Associated symptoms include chills, sore throat (mild) and myalgias.     Patient Active Problem List   Diagnosis Code   ??? Benign hypertension I10   ??? 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 Diabetic neuropathy (HCC) E11.40   ??? GERD (gastroesophageal reflux disease) K21.9   ??? History of nephrolithiasis Z87.442   ??? Insulin pump in place Z96.41   ??? 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 Diplopia H53.2   ??? 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 Encephalomalacia on imaging study G93.89   ??? History of stroke Z86.73   ??? 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 Branch retinal vein occlusion [id number]   ??? 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 Pure hypercholesterolemia E78.00   ??? 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  lisinopril (PRINIVIL, ZESTRIL) 20 mg tablet, TAKE 1 TABLET BY MOUTH TWICE DAILY, Disp: 180 Tab, Rfl: 2  ???  clonazePAM (KLONOPIN) 0.5 mg tablet, Take 1 Tab by mouth two (2) times a day., Disp: 180 Tab, Rfl: 1  ???  gabapentin (NEURONTIN) 300 mg capsule, TAKE 1 CAPSULE BY MOUTH qhs GENERIC FOR NEURONTIN., Disp: 90 Cap, Rfl: 2  ???  atorvastatin (LIPITOR) 20 mg tablet, TAKE 1 TABLET BY MOUTH DAILY, Disp: 90 Tab, Rfl: 3  ???  omeprazole (PRILOSEC) 40 mg capsule, TAKE ONE CAPSULE BY MOUTH DAILY, Disp: 90 Cap, Rfl: 3  ???  glucagon (GLUCAGON EMERGENCY KIT, HUMAN,) 1 mg injection, 1 mg by IntraMUSCular route as needed (low blood sugar)., Disp: 3 Kit, Rfl: 1  ???  clopidogrel (PLAVIX) 75 mg tablet, TAKE 1 TABLET BY MOUTH DAILY, Disp: 90 Tab, Rfl: 3  ???  Alpha Lipoic Acid 200 mg tab, Take  by mouth daily., Disp: , Rfl:   ???  HUMALOG 100 unit/mL injection, USE AS DIRECTED WITH PUMP, Disp: 180 mL, Rfl: 12   ???  ascorbic acid (VITAMIN C) 1,000 mg tablet, Take  by mouth daily., Disp: , Rfl:   ???  multivitamins-minerals-lutein (CENTRUM SILVER) Tab, Take  by mouth daily., Disp: , Rfl:   ???  vitamin e (E GEMS) 1,000 unit capsule, Take 1,000 Units by mouth daily. Patient is taking 400 iu, Disp: , Rfl:   ???  Cholecalciferol, Vitamin D3, 5,000 unit Tab, Take  by mouth daily. Patient is currently taking 2000 iu, Disp: , Rfl:   ???  omega-3 fatty acids-vitamin e (FISH OIL) 1,000 mg cap, Take 1 Cap by mouth daily., Disp: , Rfl:   ???  coenzyme q10 10 mg cap, Take  by mouth two (2) times a day., Disp: , Rfl:     Review of Systems   Constitutional: Positive for chills. Negative for fever.   HENT: Positive for sore throat (mild).    Respiratory: Positive for cough. Negative for sputum production.         Burning with cough   Musculoskeletal: Positive for myalgias.     Visit Vitals   ??? BP 138/82 (BP 1 Location: Left arm, BP Patient Position: Sitting)   ??? Pulse (!) 103   ??? Temp 99.3 ??F (37.4 ??C) (Oral)   ??? Resp 16   ???  Ht '5\' 8"'  (1.727 m)   ??? Wt 173 lb 9.6 oz (78.7 kg)   ??? SpO2 97%   ??? BMI 26.4 kg/m2     Physical Exam   Constitutional: He is oriented to person, place, and time. He appears well-developed and well-nourished.   HENT:   Head: Normocephalic.   Right Ear: Tympanic membrane and ear canal normal.   Left Ear: Tympanic membrane and ear canal normal.   Mouth/Throat: Oropharynx is clear and moist.   Eyes: Conjunctivae and EOM are normal.   Neck: Neck supple.   Cardiovascular: Normal rate, regular rhythm and normal heart sounds.    Pulmonary/Chest: Effort normal and breath sounds normal.   Lymphadenopathy:     He has no cervical adenopathy.   Neurological: He is alert and oriented to person, place, and time.   Skin: Skin is warm and dry.   Psychiatric: He has a normal mood and affect. His behavior is normal.   Nursing note and vitals reviewed.    CXR - no infiltrarte seen  - radiology interpretation pending  ASSESSMENT and PLAN     ICD-10-CM ICD-9-CM    1. Lower resp. tract infection J22 519.8 XR CHEST PA LAT      azithromycin (ZITHROMAX) 250 mg tablet      chlorpheniramine-HYDROcodone (TUSSIONEX) 10-8 mg/5 mL suspension   Complete prescribed course of antibiotics.  Follow up for new symptoms, worsening symptoms or failure to improve.

## 2015-09-22 NOTE — Progress Notes (Signed)
Maurice [name] is a 60 y.o. male here for cold symptoms      1. Have you been to the ER, urgent care clinic or hospitalized since your last visit? NO.     2. Have you seen or consulted any other health care providers outside of the [facility] since your last visit (Include any pap smears or colon screening)? NO      Do you have an Advanced Directive? NO    Would you like information on Advanced Directives? NO

## 2015-09-22 NOTE — Patient Instructions (Signed)
Complete prescribed course of antibiotics.  Follow up for new symptoms, worsening symptoms or failure to improve.

## 2015-10-24 LAB — AMB EXT CREATININE: Creatinine, External: <15.4

## 2015-10-24 LAB — AMB EXT URINE MICROALBUMIN: Urine Microalbumin, External: <3

## 2015-12-05 MED ORDER — TRAZODONE 50 MG TAB
50 mg | ORAL_TABLET | Freq: Every evening | ORAL | 1 refills | Status: DC
Start: 2015-12-05 — End: 2016-06-18

## 2015-12-19 ENCOUNTER — Encounter: Attending: Internal Medicine

## 2015-12-29 MED ORDER — CLOPIDOGREL 75 MG TAB
75 mg | ORAL_TABLET | ORAL | 3 refills | Status: DC
Start: 2015-12-29 — End: 2016-12-16

## 2016-01-02 ENCOUNTER — Ambulatory Visit: Admit: 2016-01-02 | Discharge: 2016-01-02 | Payer: PRIVATE HEALTH INSURANCE | Attending: Internal Medicine

## 2016-01-02 DIAGNOSIS — E1040 Type 1 diabetes mellitus with diabetic neuropathy, unspecified: Secondary | ICD-10-CM

## 2016-01-02 LAB — AMB POC HEMOGLOBIN A1C: Hemoglobin A1c (POC): 7.5 %

## 2016-01-02 NOTE — Progress Notes (Signed)
1. Have you been to the ER, urgent care clinic since your last visit?  Hospitalized since your last visit?  No     2. Have you seen or consulted any other health care providers outside of the [facility] since your last visit?  Include any pap smears or colon screening.   Yes, [facility] (PDT) Nov 2016/Jan- May  2017 , Dr. Rockney Gheekeke ophthalmology Nov 2016/Jan 2017 -May 2017 (YAG procedure) cataract surgeon, Dr. Allene Dillonorres endo Dec 2016/March 2017, dentist Jan 2017

## 2016-01-02 NOTE — Progress Notes (Signed)
Assessment/Plan:    1. Type 1 diabetes mellitus with diabetic neuropathy (HCC)  -A1c 7.5.  Managed by Dr. Alvira Monday.  - AMB POC HEMOGLOBIN A1C    2. Psychophysiological insomnia  -change to tylenol if needed, instead of ibuprofen/aleve    The plan was discussed with the patient.  The patient verbalized understanding and is in agreement with the plan.  All medication potential side effects were discussed with the patient.    Health Maintenance:   Health Maintenance   Topic Date Due   ??? Hepatitis C Screening  27-May-1956   ??? ZOSTER VACCINE AGE 72>  09/26/2015   ??? HEMOGLOBIN A1C Q6M  12/18/2015   ??? INFLUENZA AGE 94 TO ADULT  03/16/2016   ??? EYE EXAM RETINAL OR DILATED Q1  06/05/2016   ??? FOOT EXAM Q1  06/19/2016   ??? LIPID PANEL Q1  06/19/2016   ??? MICROALBUMIN Q1  10/24/2016   ??? COLONOSCOPY  08/15/2018   ??? DTaP/Tdap/Td series (2 - Td) 04/12/2024   ??? Pneumococcal 19-64 Medium Risk  Completed     Bronsen Serano is a 60 y.o. male and presents with Follow Up Chronic Condition     Subjective:  DM1 - A1c     He c/o not sleeping well still.  He will awaken at 1am for "no reason".  Denies stress/worries.  Since 10/2015, he's been taking ibuprofen (not pm) or aleve w/ relief.  Typical sleep aids have the opposite effect of make him awake (ie benadryl).  Trazodone didn't work either.  He denies pain or nocturia.  He doesn't drink ETOH.    ROS:  Constitutional: No recent weight change. No weakness/fatigue.  No f/c.   Cardiovascular: No CP/palpitations.  No DOE/orthopnea/PND.   Respiratory: No cough/sputum, dyspnea, wheezing.   Gastointestinal: No dysphagia, reflux.  No n/v.  No constipation/diarrhea.  No melena/rectal bleeding.   Genitourinary: No dysuria, urinary hesitancy, nocturia, hematuria.  No incontinence.   Neurological: No seizures/numbness/weakness.  No paresthesias.   Psychiatric:  No depression, anxiety.     The problem list was updated as a part of today's visit.  Patient Active Problem List   Diagnosis Code    ??? Benign hypertension I10   ??? 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 Diabetic neuropathy (HCC) E11.40   ??? GERD (gastroesophageal reflux disease) K21.9   ??? History of nephrolithiasis Z87.442   ??? Insulin pump in place Z96.41   ??? 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 Diplopia H53.2   ??? 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 Encephalomalacia on imaging study G93.89   ??? History of stroke Z86.73   ??? 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 Branch retinal vein occlusion [id number]   ??? 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 Pure hypercholesterolemia E78.00   ??? 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    SH:  Social History   Substance Use Topics   ??? Smoking status: Never Smoker   ??? Smokeless tobacco: Never Used   ??? Alcohol use 1.5 oz/week     3 Cans of beer per week       Medications/Allergies:  Current Outpatient Prescriptions on File Prior to Visit   Medication Sig Dispense Refill   ??? clopidogrel (PLAVIX) 75 mg tab TAKE 1 TABLET BY MOUTH DAILY 90 Tab 3   ??? traZODone (DESYREL) 50 mg tablet  Take 1 Tab by mouth nightly. 30 Tab 1   ??? chlorpheniramine-HYDROcodone (TUSSIONEX) 10-8 mg/5 mL suspension Take 5 mL by mouth every twelve (12) hours as needed for Cough. Max Daily Amount: 10 mL. 180 mL 0   ??? lisinopril (PRINIVIL, ZESTRIL) 20 mg tablet TAKE 1 TABLET BY MOUTH TWICE DAILY 180 Tab 2   ??? clonazePAM (KLONOPIN) 0.5 mg tablet Take 1 Tab by mouth two (2) times a day. 180 Tab 1   ??? gabapentin (NEURONTIN) 300 mg capsule TAKE 1 CAPSULE BY MOUTH qhs GENERIC FOR NEURONTIN. 90 Cap 2   ??? atorvastatin (LIPITOR) 20 mg tablet TAKE 1 TABLET BY MOUTH DAILY 90 Tab 3   ??? omeprazole (PRILOSEC) 40 mg capsule TAKE ONE CAPSULE BY MOUTH DAILY 90 Cap 3   ??? glucagon (GLUCAGON EMERGENCY KIT, HUMAN,) 1 mg injection 1 mg by IntraMUSCular route as needed (low blood sugar). 3 Kit 1    ??? Alpha Lipoic Acid 200 mg tab Take  by mouth daily.     ??? HUMALOG 100 unit/mL injection USE AS DIRECTED WITH PUMP 180 mL 12   ??? ascorbic acid (VITAMIN C) 1,000 mg tablet Take  by mouth daily.     ??? multivitamins-minerals-lutein (CENTRUM SILVER) Tab Take  by mouth daily.     ??? vitamin e (E GEMS) 1,000 unit capsule Take 1,000 Units by mouth daily. Patient is taking 400 iu     ??? Cholecalciferol, Vitamin D3, 5,000 unit Tab Take  by mouth daily. Patient is currently taking 2000 iu     ??? omega-3 fatty acids-vitamin e (FISH OIL) 1,000 mg cap Take 1 Cap by mouth daily.     ??? coenzyme q10 10 mg cap Take  by mouth two (2) times a day.       No current facility-administered medications on file prior to visit.         No Known Allergies    Objective:  Visit Vitals   ??? BP 114/62 (BP 1 Location: Right arm, BP Patient Position: Sitting)   ??? Pulse 100   ??? Temp 98.3 ??F (36.8 ??C) (Oral)   ??? Resp 18   ??? Ht '5\' 8"'  (1.727 m)   ??? Wt 177 lb (80.3 kg)   ??? SpO2 96%   ??? BMI 26.91 kg/m2      Constitutional: Well developed, nourished, no distress, alert   CV: S1, S2.  RRR.  No murmurs/rubs.  No thrills palpated.  No carotid bruits.  Intact distal pulses.  No edema.   Pulm: No abnormalities on inspection.  Clear to auscultation bilaterally. No wheezing/rhonchi.  Normal effort.     GI: Soft, nontender, nondistended.  Normal active bowel sounds.    Neuro: A/O x 3. No focal motor or sensory deficits. Speech normal.   Skin: No lesions/rashes on inspection.     Psych: Appropriate affect, judgement and insight.  Short-term memory intact.

## 2016-02-24 ENCOUNTER — Ambulatory Visit: Admit: 2016-02-24 | Discharge: 2016-02-24 | Payer: PRIVATE HEALTH INSURANCE | Attending: Nurse Practitioner

## 2016-02-24 DIAGNOSIS — J40 Bronchitis, not specified as acute or chronic: Secondary | ICD-10-CM

## 2016-02-24 MED ORDER — BENZONATATE 200 MG CAP
200 mg | ORAL_CAPSULE | Freq: Three times a day (TID) | ORAL | 0 refills | Status: AC | PRN
Start: 2016-02-24 — End: 2016-03-02

## 2016-02-24 MED ORDER — AZITHROMYCIN 250 MG TAB
250 mg | ORAL_TABLET | ORAL | 0 refills | Status: AC
Start: 2016-02-24 — End: 2016-02-29

## 2016-02-24 NOTE — Progress Notes (Signed)
Subjective:      Maurice [name] is an 60 y.o. male here for evaluation of cough.  The cough is non-productive, without wheezing, dyspnea or hemoptysis, harsh, worsening over time and is aggravated by nothing. Onset of symptoms was 7 days ago, gradually worsening since that time.  Associated symptoms include heartburn. Patient does not have a history of asthma. Patient does not have a history of environmental allergens. Patient has not recent travel. Patient does not have a history of smoking. Patient  has not previous Chest X-ray. Patient has not had a PPD done.  Past Medical History:   Diagnosis Date   ??? Branch retinal vein occlusion 09/19/2013   ??? Diabetes mellitus ([place])    ??? Elevated cholesterol    ??? GERD (gastroesophageal reflux disease) 04/04/2012   ??? GERD (gastroesophageal reflux disease) 04/04/2012   ??? H/O echocardiogram     nml   ??? High triglycerides    ??? Hypertension    ??? Kidney stone    ??? Normal cardiac stress test 2006   ??? 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 TIA (transient ischemic attack) 06/22/2013     Family History   Problem Relation Age of Onset   ??? Cancer Father 50     prostate   ??? Parkinsonism Father    ??? Other Other      parkinson uncle   ??? Cancer Maternal Grandfather      mesothelioma     Current Outpatient Prescriptions   Medication Sig Dispense Refill   ??? benzonatate (TESSALON) 200 mg capsule Take 1 Cap by mouth three (3) times daily as needed for Cough for up to 7 days. 15 Cap 0   ??? azithromycin (ZITHROMAX) 250 mg tablet Take 2 tablets today, then take 1 tablet daily 6 Tab 0   ??? clopidogrel (PLAVIX) 75 mg tab TAKE 1 TABLET BY MOUTH DAILY 90 Tab 3   ??? traZODone (DESYREL) 50 mg tablet Take 1 Tab by mouth nightly. 30 Tab 1   ??? lisinopril (PRINIVIL, ZESTRIL) 20 mg tablet TAKE 1 TABLET BY MOUTH TWICE DAILY 180 Tab 2   ??? clonazePAM (KLONOPIN) 0.5 mg tablet Take 1 Tab by mouth two (2) times a day. 180 Tab 1   ??? gabapentin (NEURONTIN) 300 mg capsule TAKE 1 CAPSULE BY MOUTH qhs  GENERIC FOR NEURONTIN. 90 Cap 2   ??? atorvastatin (LIPITOR) 20 mg tablet TAKE 1 TABLET BY MOUTH DAILY 90 Tab 3   ??? omeprazole (PRILOSEC) 40 mg capsule TAKE ONE CAPSULE BY MOUTH DAILY 90 Cap 3   ??? glucagon (GLUCAGON EMERGENCY KIT, HUMAN,) 1 mg injection 1 mg by IntraMUSCular route as needed (low blood sugar). 3 Kit 1   ??? Alpha Lipoic Acid 200 mg tab Take  by mouth daily.     ??? HUMALOG 100 unit/mL injection USE AS DIRECTED WITH PUMP 180 mL 12   ??? ascorbic acid (VITAMIN C) 1,000 mg tablet Take  by mouth daily.     ??? multivitamins-minerals-lutein (CENTRUM SILVER) Tab Take  by mouth daily.     ??? vitamin e (E GEMS) 1,000 unit capsule Take 1,000 Units by mouth daily. Patient is taking 400 iu     ??? Cholecalciferol, Vitamin D3, 5,000 unit Tab Take  by mouth daily. Patient is currently taking 2000 iu     ??? omega-3 fatty acids-vitamin e (FISH OIL) 1,000 mg cap Take 1 Cap by mouth daily.     ??? coenzyme q10 10 mg cap  Take  by mouth two (2) times a day.       No Known Allergies  Social History     Social History   ??? Marital status: MARRIED     Spouse name: N/A   ??? Number of children: N/A   ??? Years of education: N/A     Occupational History   ??? Not on file.     Social History Main Topics   ??? Smoking status: Never Smoker   ??? Smokeless tobacco: Never Used   ??? Alcohol use No   ??? Drug use: No   ??? Sexual activity: Not on file     Other Topics Concern   ??? Not on file     Social History Narrative     Review of Systems  A comprehensive review of systems was negative except for that written in the HPI.    Objective:     Visit Vitals   ??? BP (!) 160/96   ??? Pulse 90   ??? Temp 98.1 ??F (36.7 ??C) (Oral)   ??? Resp 12   ??? Ht '5\' 8"'  (1.727 m)   ??? Wt 177 lb (80.3 kg)   ??? SpO2 98%   ??? BMI 26.91 kg/m2     Oxygens Saturation 98% on  room air  General:  alert, cooperative, no distress, appears stated age   20:  ENT exam normal, no neck nodes or sinus tenderness   Lungs: clear to auscultation bilaterally    Heart:  regular rate and rhythm, S1, S2 normal, no murmur, click, rub or gallop   Abdomen: soft, non-tender. Bowel sounds normal. No masses,  no organomegaly   Extremities: extremities normal, atraumatic, no cyanosis or edema      Neurological: alert, oriented x 3, no defects noted in general exam.     Assessment:     1. Bronchitis    - benzonatate (TESSALON) 200 mg capsule; Take 1 Cap by mouth three (3) times daily as needed for Cough for up to 7 days.  Dispense: 15 Cap; Refill: 0  - azithromycin (ZITHROMAX) 250 mg tablet; Take 2 tablets today, then take 1 tablet daily  Dispense: 6 Tab; Refill: 0    Plan:   Follow up with  Your primary care provider or urgent care  if symptoms worsens or fail to improve within the next three to four days.

## 2016-02-24 NOTE — Patient Instructions (Signed)
Bronchitis: Care Instructions  Your Care Instructions    Bronchitis is inflammation of the bronchial tubes, which carry air to the lungs. The tubes swell and produce mucus, or phlegm. The mucus and inflamed bronchial tubes make you cough. You may have trouble breathing.  Most cases of bronchitis are caused by viruses like those that cause colds. Antibiotics usually do not help and they may be harmful.  Bronchitis usually develops rapidly and lasts about 2 to 3 weeks in otherwise healthy people.  Follow-up care is a key part of your treatment and safety. Be sure to make and go to all appointments, and call your doctor if you are having problems. It's also a good idea to know your test results and keep a list of the medicines you take.  How can you care for yourself at home?  ?? Take all medicines exactly as prescribed. Call your doctor if you think you are having a problem with your medicine.  ?? Get some extra rest.  ?? Take an over-the-counter pain medicine, such as acetaminophen (Tylenol), ibuprofen (Advil, Motrin), or naproxen (Aleve) to reduce fever and relieve body aches. Read and follow all instructions on the label.  ?? Do not take two or more pain medicines at the same time unless the doctor told you to. Many pain medicines have acetaminophen, which is Tylenol. Too much acetaminophen (Tylenol) can be harmful.  ?? Take an over-the-counter cough medicine that contains dextromethorphan to help quiet a dry, hacking cough so that you can sleep. Avoid cough medicines that have more than one active ingredient. Read and follow all instructions on the label.  ?? Breathe moist air from a humidifier, hot shower, or sink filled with hot water. The heat and moisture will thin mucus so you can cough it out.  ?? Do not smoke. Smoking can make bronchitis worse. If you need help quitting, talk to your doctor about stop-smoking programs and medicines. These can increase your chances of quitting for good.   When should you call for help?  Call 911 anytime you think you may need emergency care. For example, call if:  ?? You have severe trouble breathing.  Call your doctor now or seek immediate medical care if:  ?? You have new or worse trouble breathing.  ?? You cough up dark brown or bloody mucus (sputum).  ?? You have a new or higher fever.  ?? You have a new rash.  Watch closely for changes in your health, and be sure to contact your doctor if:  ?? You cough more deeply or more often, especially if you notice more mucus or a change in the color of your mucus.  ?? You are not getting better as expected.  Where can you learn more?  Go to http://www.healthwise.net/[employer]Help[employer]ctions.  Enter H333 in the search box to learn more about "Bronchitis: Care Instructions."  Current as of: November 08, 2015  Content Version: 11.3  ?? 2006-2017 Healthwise[employer]. Care instructions adapted under license by [employer] (which disclaims liability or warranty for this information). If you have questions about a medical condition or this instruction, always ask your healthcare professional. Healthwise, Incorporated disclaims any warranty or liability for your use of this information.

## 2016-02-26 ENCOUNTER — Encounter

## 2016-02-26 MED ORDER — CODEINE-GUAIFENESIN 10 MG-100 MG/5 ML ORAL LIQUID
100-10 mg/5 mL | Freq: Every evening | ORAL | 0 refills | Status: DC | PRN
Start: 2016-02-26 — End: 2016-06-18

## 2016-02-26 NOTE — Progress Notes (Signed)
Faxed Robitussin AC to [facility] per Dr. Melina SchoolsSzulc.

## 2016-02-27 MED ORDER — CLONAZEPAM 0.5 MG TAB
0.5 mg | ORAL_TABLET | Freq: Two times a day (BID) | ORAL | 1 refills | Status: DC
Start: 2016-02-27 — End: 2016-06-18

## 2016-02-27 NOTE — Telephone Encounter (Signed)
From: Maurice BraunBrady [name]  To: Daine GravelSusan Grae Leathers, MD  Sent: 02/26/2016 11:16 PM EDT  Subject:  Medication Renewal Request    Original  authorizing provider: Daine GravelSusan Shakura Cowing, MD    Maurice BraunBrady  [name] would like a refill of the following medications:  clonazePAM  (KLONOPIN) 0.5 mg tablet Daine Gravel[Taqwa Deem, MD]    Preferred  pharmacy: [facility] [id number] - [place], [place] - [address] AT [facility] [address]  [place]    Comment:

## 2016-03-10 MED ORDER — GABAPENTIN 300 MG CAP
300 mg | ORAL_CAPSULE | ORAL | 3 refills | Status: DC
Start: 2016-03-10 — End: 2017-02-23

## 2016-04-06 MED ORDER — OMEPRAZOLE 40 MG CAP, DELAYED RELEASE
40 mg | ORAL_CAPSULE | ORAL | 3 refills | Status: DC
Start: 2016-04-06 — End: 2017-05-30

## 2016-04-23 MED ORDER — ATORVASTATIN 20 MG TAB
20 mg | ORAL_TABLET | ORAL | 3 refills | Status: DC
Start: 2016-04-23 — End: 2017-04-11

## 2016-06-07 MED ORDER — LISINOPRIL 20 MG TAB
20 mg | ORAL_TABLET | ORAL | 0 refills | Status: DC
Start: 2016-06-07 — End: 2016-08-06

## 2016-06-18 ENCOUNTER — Ambulatory Visit: Admit: 2016-06-18 | Discharge: 2016-06-18 | Payer: PRIVATE HEALTH INSURANCE | Attending: Internal Medicine

## 2016-06-18 DIAGNOSIS — E1040 Type 1 diabetes mellitus with diabetic neuropathy, unspecified: Secondary | ICD-10-CM

## 2016-06-18 LAB — AMB POC HEMOGLOBIN A1C: Hemoglobin A1c (POC): 6.8 %

## 2016-06-18 MED ORDER — CLONAZEPAM 0.5 MG TAB
0.5 mg | ORAL_TABLET | Freq: Every day | ORAL | 1 refills | Status: DC
Start: 2016-06-18 — End: 2016-10-31

## 2016-06-18 NOTE — Progress Notes (Signed)
Assessment/Plan:    1. Type 1 diabetes mellitus with diabetic neuropathy (HCC)  -A1c is 6.8  - AMB POC HEMOGLOBIN A1C  - MICROALBUMIN, UR, RAND W/ MICROALBUMIN/CREA RATIO; Future  - HM DIABETES FOOT EXAM    2. Benign hypertension  -cont current regimen  - METABOLIC PANEL, COMPREHENSIVE; Future  - LIPID PANEL; Future  - CBC W/O DIFF; Future    3. Routine general medical examination at a health care facility  - METABOLIC PANEL, COMPREHENSIVE; Future  - LIPID PANEL; Future  - CBC W/O DIFF; Future    4. Special screening examination for viral disease  - HCV AB W/RFLX TO NAA; Future    The plan was discussed with the patient.  The patient verbalized understanding and is in agreement with the plan.  All medication potential side effects were discussed with the patient.    Health Maintenance:   Health Maintenance   Topic Date Due   ??? Hepatitis C Screening  Jul 25, 1956   ??? LIPID PANEL Q1  06/19/2016   ??? HEMOGLOBIN A1C Q6M  07/04/2016   ??? MICROALBUMIN Q1  10/24/2016   ??? EYE EXAM RETINAL OR DILATED Q1  03/25/2017   ??? FOOT EXAM Q1  06/18/2017   ??? COLONOSCOPY  08/15/2018   ??? DTaP/Tdap/Td series (2 - Td) 04/12/2024   ??? ZOSTER VACCINE AGE 11>  Completed   ??? Pneumococcal 19-64 Medium Risk  Completed   ??? INFLUENZA AGE 76 TO ADULT  Completed       Maurice [name] is a 60 y.o. male and presents with Hypertension; Cholesterol Problem; and Diabetes     Subjective:  DM1 - A1c .  Managed by endo.    HTN - bp controlled.    Up to date on preventative health care.    ROS:  Constitutional: No recent weight change. No weakness/fatigue.  No f/c.   Cardiovascular: No CP/palpitations.  No DOE/orthopnea/PND.   Respiratory: No cough/sputum, dyspnea, wheezing.   Gastointestinal: No dysphagia, reflux.  No n/v.  No constipation/diarrhea.  No melena/rectal bleeding.   Neurological: No seizures/numbness/weakness.  No paresthesias.   Psychiatric:  No depression, anxiety.     The problem list was updated as a part of today's visit.   Patient Active Problem List   Diagnosis Code   ??? Benign hypertension I10   ??? 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 Diabetic neuropathy (HCC) E11.40   ??? GERD (gastroesophageal reflux disease) K21.9   ??? History of nephrolithiasis Z87.442   ??? Insulin pump in place Z96.41   ??? 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 Diplopia H53.2   ??? 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 Encephalomalacia on imaging study G93.89   ??? History of stroke Z86.73   ??? 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 Branch retinal vein occlusion [id number]   ??? 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 Pure hypercholesterolemia E78.00   ??? 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    SH:  Social History   Substance Use Topics   ??? Smoking status: Never Smoker   ??? Smokeless tobacco: Never Used   ??? Alcohol use No       Medications/Allergies:  Current Outpatient Prescriptions on File Prior to Visit   Medication Sig Dispense Refill   ??? lisinopril (PRINIVIL, ZESTRIL) 20 mg tablet TAKE 1 TABLET BY MOUTH TWICE DAILY 180  Tab 0   ??? atorvastatin (LIPITOR) 20 mg tablet TAKE 1 TABLET BY MOUTH DAILY 90 Tab 3   ??? omeprazole (PRILOSEC) 40 mg capsule TAKE 1 CAPSULE BY MOUTH DAILY 90 Cap 3   ??? gabapentin (NEURONTIN) 300 mg capsule TAKE 1 CAPSULE BY MOUTH EVERY NIGHT AT BEDTIME 90 Cap 3   ??? clonazePAM (KLONOPIN) 0.5 mg tablet Take 1 Tab by mouth two (2) times a day. 180 Tab 1   ??? clopidogrel (PLAVIX) 75 mg tab TAKE 1 TABLET BY MOUTH DAILY 90 Tab 3   ??? glucagon (GLUCAGON EMERGENCY KIT, HUMAN,) 1 mg injection 1 mg by IntraMUSCular route as needed (low blood sugar). 3 Kit 1   ??? Alpha Lipoic Acid 200 mg tab Take  by mouth daily.     ??? HUMALOG 100 unit/mL injection USE AS DIRECTED WITH PUMP 180 mL 12   ??? ascorbic acid (VITAMIN C) 1,000 mg tablet Take  by mouth daily.     ??? multivitamins-minerals-lutein (CENTRUM SILVER) Tab Take  by mouth daily.      ??? vitamin e (E GEMS) 1,000 unit capsule Take 1,000 Units by mouth daily. Patient is taking 400 iu     ??? Cholecalciferol, Vitamin D3, 5,000 unit Tab Take  by mouth daily. Patient is currently taking 2000 iu     ??? omega-3 fatty acids-vitamin e (FISH OIL) 1,000 mg cap Take 1 Cap by mouth daily.     ??? coenzyme q10 10 mg cap Take  by mouth two (2) times a day.     ??? guaiFENesin-codeine (ROBITUSSIN AC) 100-10 mg/5 mL solution Take 5 mL by mouth nightly as needed for Cough. Max Daily Amount: 5 mL. 180 mL 0   ??? traZODone (DESYREL) 50 mg tablet Take 1 Tab by mouth nightly. 30 Tab 1     No current facility-administered medications on file prior to visit.         No Known Allergies    Objective:  Visit Vitals   ??? BP 110/60 (BP 1 Location: Right arm, BP Patient Position: Sitting)   ??? Pulse (!) 105   ??? Temp 98.4 ??F (36.9 ??C) (Oral)   ??? Resp 20   ??? Ht _0  (1.727 m)   ??? Wt 167 lb 9.6 oz (76 kg)   ??? SpO2 97%   ??? BMI 25.48 kg/m2      Constitutional: Well developed, nourished, no distress, alert   CV: S1, S2.  RRR.  No murmurs/rubs.  No thrills palpated.  No carotid bruits.  Intact distal pulses.  No edema.   Pulm: No abnormalities on inspection.  Clear to auscultation bilaterally. No wheezing/rhonchi.  Normal effort.     Neuro: A/O x 3. No focal motor or sensory deficits. Speech normal.   Psych: Appropriate affect, judgement and insight.  Short-term memory intact.     Monofilament nml

## 2016-06-18 NOTE — Progress Notes (Signed)
Maurice [name] is a 60 y.o. male (DOB: [birth year]) presenting to address:    Chief Complaint   Patient presents with   ??? Hypertension   ??? Cholesterol Problem   ??? Diabetes       Vitals:    06/18/16 1303   BP: 110/60   Pulse: (!) 105   Resp: 20   Temp: 98.4 ??F (36.9 ??C)   TempSrc: Oral   SpO2: 97%   Weight: 167 lb 9.6 oz (76 kg)   Height: 5\' 8"  (1.727 m)   PainSc:   0 - No pain       Learning Assessment:     Learning Assessment 12/21/2013   PRIMARY LEARNER Patient   HIGHEST LEVEL OF EDUCATION - PRIMARY LEARNER  4 YEARS OF COLLEGE   BARRIERS PRIMARY LEARNER NONE   CO-LEARNER CAREGIVER No   PRIMARY LANGUAGE ENGLISH   INTERPRETER NEED No   LEARNER PREFERENCE PRIMARY DEMONSTRATION     DEMONSTRATION   ANSWERED BY Patient   RELATIONSHIP SELF     Depression Screening:     PHQ over the last two weeks 02/25/2016   Little interest or pleasure in doing things Not at all   Feeling down, depressed or hopeless Not at all   Total Score PHQ 2 0     Abuse Screening:     Abuse Screening Questionnaire 06/18/2016   Do you ever feel afraid of your partner? N   Are you in a relationship with someone who physically or mentally threatens you? N   Is it safe for you to go home? Y     Coordination of Care Questionaire:   1. Have you been to the ER, urgent care clinic since your last visit?  Hospitalized since your last visit? NO    2. Have you seen or consulted any other health care providers outside of the [facility] since your last visit?  Include any pap smears or colon screening. YES. Endo 06/04/16, retina specialist 04/23/16, dentist 03/26/26 03/25/16 ophthalmology  03/25/16    Advanced Directive:   1. Do you have an Advanced Directive? NO    2. Would you like information on Advanced Directives? YES

## 2016-06-26 ENCOUNTER — Encounter

## 2016-06-30 MED ORDER — GLUCAGON EMERGENCY KIT 1 MG SOLUTION FOR INJECTION
1 mg | PACK | INTRAMUSCULAR | 0 refills | Status: DC
Start: 2016-06-30 — End: 2017-02-01

## 2016-07-19 LAB — AMB EXT HGBA1C: Hemoglobin A1c, External: 7.4

## 2016-07-27 NOTE — Telephone Encounter (Signed)
Patient is going to be seen by PT for 2 week 3 times a week. Patient is going to be seen for balance and endurance training.     Physical Therapist wanted to make the provider aware that the patient has drug interaction between the omeprazole and the Clopidogrel.     Physical therapy wanted to make sure that the provider will be signing off on the Physical therapy orders.     PLease advise

## 2016-07-29 NOTE — Telephone Encounter (Signed)
Pt being seen 12/22 post hospital.

## 2016-08-06 ENCOUNTER — Ambulatory Visit: Admit: 2016-08-06 | Discharge: 2016-08-06 | Payer: PRIVATE HEALTH INSURANCE | Attending: Internal Medicine

## 2016-08-06 DIAGNOSIS — I10 Essential (primary) hypertension: Secondary | ICD-10-CM

## 2016-08-06 MED ORDER — LISINOPRIL 10 MG TAB
10 mg | ORAL_TABLET | ORAL | 0 refills | Status: DC
Start: 2016-08-06 — End: 2016-10-31

## 2016-08-06 NOTE — Patient Instructions (Signed)
Esophagitis: Care Instructions  Your Care Instructions    Esophagitis (say "ih-sof-uh-JY-tus") is irritation of the esophagus, the tube that carries food from your throat to your stomach.  Acid reflux is the most common cause of this condition. When you have reflux, stomach acid and juices flow upward. This can cause pain or a burning feeling in your chest. You may have a sore throat. It may be hard to swallow.  Other causes of this condition include some medicines and supplements. Allergies or an infection can also cause it.  Your doctor will ask about your symptoms and past health. He or she might do tests to find the cause of your symptoms.  Treatment depends on what is causing the problem.Treatment might include changing your diet or taking medicine to relieve your symptoms. It might also include changing a medicine that is causing your symptoms.  If you have reflux, medicine that reduces the stomach acid helps your body heal. It might take 1 to 3 weeks to heal.  Follow-up care is a key part of your treatment and safety. Be sure to make and go to all appointments, and call your doctor if you are having problems. It's also a good idea to know your test results and keep a list of the medicines you take.  How can you care for yourself at home?  ?? If you have acid reflux, your doctor may recommend that you:  ?? Eat several small meals instead of two or three large meals. After you eat, wait 2 to 3 hours before you lie down.  ?? Avoid chocolate, mint, alcohol, and spicy foods.  ?? Don't smoke or use smokeless tobacco. Smoking can make this condition worse. If you need help quitting, talk to your doctor about stop-smoking programs and medicines. These can increase your chances of quitting for good.  ?? Raise the head of your bed 6 to 8 inches if you have symptoms at night.  ?? Lose weight if you are overweight.  ?? Take an over-the-counter antacid, such as Maalox, Mylanta, or Tums. Be  careful when you take over-the-counter antacid medicines. Many of these medicines have aspirin in them. Read the label to make sure that you are not taking more than the recommended dose. Too much aspirin can be harmful.  ?? Take stronger acid reducers. Examples are famotidine (such as Pepcid), omeprazole (such as Prilosec), and ranitidine (such as Zantac).  ?? If your condition is caused by infection, allergy, or other problems, use the medicine or treatments that your doctor recommends.  ?? Be safe with medicines. Take your medicines exactly as prescribed. Call your doctor if you think you are having a problem with your medicine.  When should you call for help?  Call your doctor now or seek immediate medical care if:  ? ?? You have new or worse belly pain.   ? ?? You are vomiting.   ?Watch closely for changes in your health, and be sure to contact your doctor if:  ? ?? You have new or worse symptoms of reflux.   ? ?? You have trouble or pain swallowing.   ? ?? You are losing weight.   ? ?? You do not get better as expected.   Where can you learn more?  Go to http://www.healthwise.net/GoodHelp[employer]ctions.  Enter N977 in the search box to learn more about "Esophagitis: Care Instructions."  Current as of: Dec 26, 2015  Content Version: 11.4  ?? 2006-2017 Healthwise[employer]. Care instructions adapted under license by   [employer] (which disclaims liability or warranty for this information). If you have questions about a medical condition or this instruction, always ask your healthcare professional. Healthwise, Incorporated disclaims any warranty or liability for your use of this information.

## 2016-08-06 NOTE — Progress Notes (Signed)
Assessment/Plan:    1. Benign hypertension  -decrease dose to 10 mg.  F/u in 3 mos  - lisinopril (PRINIVIL, ZESTRIL) 10 mg tablet; TAKE 1 TABLET BY MOUTH DAILY  Dispense: 90 Tab; Refill: 0    2. Erosive esophagitis  -has f/u with GI scheduled for repeat endo in 30mo    3. Hospital discharge follow-up      The plan was discussed with the patient.  The patient verbalized understanding and is in agreement with the plan.  All medication potential side effects were discussed with the patient.    Health Maintenance:   Health Maintenance   Topic Date Due   ??? Hepatitis C Screening  [birth year]  ??? LIPID PANEL Q1  06/19/2016   ??? MICROALBUMIN Q1  10/24/2016   ??? HEMOGLOBIN A1C Q6M  12/16/2016   ??? EYE EXAM RETINAL OR DILATED Q1  03/25/2017   ??? FOOT EXAM Q1  06/18/2017   ??? COLONOSCOPY  08/15/2018   ??? DTaP/Tdap/Td series (2 - Td) 04/12/2024   ??? ZOSTER VACCINE AGE 75>  Completed   ??? Pneumococcal 19-64 Medium Risk  Completed   ??? Influenza Age 4350to Adult  Completed       Maurice [name] a 60y.o. male and presents with Hospital Follow Up     Subjective:  Pt recently hospitalized for esophagitis and UGI bleed.  He was started on sucralfate, PPI.  Pt has lost close to 10lb in past month. Plans to redo endoscopy in 2 mos.  I have reviewed the labs and endoscopy report from the hospital.  Anemia was mild upon discharge.  He has done home PT.  He had a NST, which was negative.    ROS:  Constitutional: No recent weight change. No weakness/fatigue.  No f/c.   Cardiovascular: No CP/palpitations.  No DOE/orthopnea/PND.   Respiratory: No cough/sputum, dyspnea, wheezing.   Gastointestinal: No dysphagia, reflux.  No n/v.  No constipation/diarrhea.  No melena/rectal bleeding.     The problem list was updated as a part of today's visit.  Patient Active Problem List   Diagnosis Code   ??? Benign hypertension I10   ??? 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 Diabetic neuropathy (HCC) E11.40    ??? GERD (gastroesophageal reflux disease) K21.9   ??? History of nephrolithiasis Z87.442   ??? Insulin pump in place Z96.41   ??? Diabetic retinopathy ([place] E11.319   ??? Diplopia H53.2   ??? 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 Encephalomalacia on imaging study G93.89   ??? History of stroke Z86.73   ??? 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 Branch retinal vein occlusion [id number]  ??? 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 Pure hypercholesterolemia E78.00   ??? 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 Erosive esophagitis K22.10       The PSH, FH were reviewed.    SH:  Social History   Substance Use Topics   ??? Smoking status: Never Smoker   ??? Smokeless tobacco: Never Used   ??? Alcohol use No       Medications/Allergies:  Current Outpatient Prescriptions on File Prior to Visit   Medication Sig Dispense Refill   ??? GLUCAGON EMERGENCY KIT, HUMAN, 1 mg injection INJECT 1MG IN THE MUSCLE AS NEEDED(LOW BLOOD SUGAR) 3 Kit 0   ??? clonazePAM (KLONOPIN) 0.5 mg tablet Take 1 Tab by mouth daily. Max Daily  Amount: 0.5 mg. 90 Tab 1   ??? lisinopril (PRINIVIL, ZESTRIL) 20 mg tablet TAKE 1 TABLET BY MOUTH TWICE DAILY 180 Tab 0   ??? atorvastatin (LIPITOR) 20 mg tablet TAKE 1 TABLET BY MOUTH DAILY 90 Tab 3   ??? omeprazole (PRILOSEC) 40 mg capsule TAKE 1 CAPSULE BY MOUTH DAILY 90 Cap 3   ??? gabapentin (NEURONTIN) 300 mg capsule TAKE 1 CAPSULE BY MOUTH EVERY NIGHT AT BEDTIME 90 Cap 3   ??? clopidogrel (PLAVIX) 75 mg tab TAKE 1 TABLET BY MOUTH DAILY 90 Tab 3   ??? Alpha Lipoic Acid 200 mg tab Take  by mouth daily.     ??? HUMALOG 100 unit/mL injection USE AS DIRECTED WITH PUMP 180 mL 12   ??? ascorbic acid (VITAMIN C) 1,000 mg tablet Take  by mouth daily.     ??? multivitamins-minerals-lutein (CENTRUM SILVER) Tab Take  by mouth daily.     ??? vitamin e (E GEMS) 1,000 unit capsule Take 1,000 Units by mouth daily. Patient is taking 400 iu      ??? Cholecalciferol, Vitamin D3, 5,000 unit Tab Take  by mouth daily. Patient is currently taking 2000 iu     ??? omega-3 fatty acids-vitamin e (FISH OIL) 1,000 mg cap Take 1 Cap by mouth daily.     ??? coenzyme q10 10 mg cap Take  by mouth two (2) times a day.       No current facility-administered medications on file prior to visit.         No Known Allergies    Objective:  Visit Vitals   ??? BP 98/57 (BP 1 Location: Right arm, BP Patient Position: Sitting)   ??? Pulse 93   ??? Temp 98.3 ??F (36.8 ??C) (Oral)   ??? Resp 20   ??? Ht '5\' 8"'  (1.727 m)   ??? Wt 158 lb (71.7 kg)   ??? SpO2 96%   ??? BMI 24.02 kg/m2      Constitutional: Well developed, nourished, no distress, alert   CV: S1, S2.  RRR.  No murmurs/rubs.  No thrills palpated.  No carotid bruits.  Intact distal pulses.  No edema.   Pulm: No abnormalities on inspection.  Clear to auscultation bilaterally. No wheezing/rhonchi.  Normal effort.     GI: Soft, nontender, nondistended.  Normal active bowel sounds.

## 2016-08-06 NOTE — Progress Notes (Signed)
Maurice [name] is a 60 y.o. male (DOB: 1955-11-18) presenting to address:    Chief Complaint   Patient presents with   ??? Hospital Follow Up       Vitals:    08/06/16 1403   BP: 98/57   Pulse: 93   Resp: 20   Temp: 98.3 ??F (36.8 ??C)   TempSrc: Oral   SpO2: 96%   Weight: 158 lb (71.7 kg)   Height: 5\' 8"  (1.727 m)   PainSc:   0 - No pain       Learning Assessment:     Learning Assessment 12/21/2013   PRIMARY LEARNER Patient   HIGHEST LEVEL OF EDUCATION - PRIMARY LEARNER  4 YEARS OF COLLEGE   BARRIERS PRIMARY LEARNER NONE   CO-LEARNER CAREGIVER No   PRIMARY LANGUAGE ENGLISH   INTERPRETER NEED No   LEARNER PREFERENCE PRIMARY DEMONSTRATION     DEMONSTRATION   ANSWERED BY Patient   RELATIONSHIP SELF     Depression Screening:     PHQ over the last two weeks 02/25/2016   Little interest or pleasure in doing things Not at all   Feeling down, depressed or hopeless Not at all   Total Score PHQ 2 0     Fall Risk Assessment:     Fall Risk Assessment, last 12 mths 08/06/2016   Able to walk? Yes   Fall in past 12 months? No     Abuse Screening:     Abuse Screening Questionnaire 06/18/2016   Do you ever feel afraid of your partner? N   Are you in a relationship with someone who physically or mentally threatens you? N   Is it safe for you to go home? Y     Coordination of Care Questionaire:   1. Have you been to the ER, urgent care clinic since your last visit?  Hospitalized since your last visit? YES [facility] 07/17/16- 12/11/7    2. Have you seen or consulted any other health care providers outside of the [facility] since your last visit?  Include any pap smears or colon screening. NO    Advanced Directive:   1. Do you have an Advanced Directive? YES    2. Would you like information on Advanced Directives? NO

## 2016-08-27 ENCOUNTER — Encounter: Attending: Internal Medicine

## 2016-09-11 LAB — AMB EXT LDL-C: LDL-C, External: 59

## 2016-09-11 LAB — AMB EXT HGBA1C: Hemoglobin A1c, External: 6.8

## 2016-09-11 LAB — AMB EXT CREATININE: Creatinine, External: 0.73

## 2016-09-21 NOTE — Telephone Encounter (Signed)
Notes requested from Dr Blossom Hoopsenise Lozano Nobles, h and p for psychiatry to review for med mgt. Faxed today to [phone].

## 2016-09-22 NOTE — Telephone Encounter (Signed)
Pt's [facility] called asking for copies of recent records (release sent), faxed to [phone] for psychiatrist to review  They are also asking that we write a letter stating he is compliant with his medications and he is stable medically.

## 2016-09-30 NOTE — Progress Notes (Signed)
Medical records [facility] to [facility] needed.Alternate fax [phone].  Faxed to D. [facility]. Called the office so they are aware to look for them.

## 2016-10-05 NOTE — Progress Notes (Signed)
[facility]. They are able to release his records to us. Sent to scanning.

## 2016-10-08 NOTE — Telephone Encounter (Signed)
I spoke with Dr. Katheren ShamsBoggus who just requested MRI.  I requested pt make appt to address issues.  Dr. Katheren ShamsBoggus said the patient (who was still in his office) would set up appt.

## 2016-10-08 NOTE — Telephone Encounter (Signed)
Dr Nathanial RancherGary [name], psychiatry called asking that pcp order brain imaging and neuropsych eval for late onset hallucinations.  [phone].

## 2016-10-14 ENCOUNTER — Ambulatory Visit: Admit: 2016-10-14 | Discharge: 2016-10-14 | Payer: PRIVATE HEALTH INSURANCE | Attending: Internal Medicine

## 2016-10-14 DIAGNOSIS — R440 Auditory hallucinations: Secondary | ICD-10-CM
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
## 2016-10-14 NOTE — Progress Notes (Signed)
Maurice [name] is a 61 y.o. male (DOB: 1956-04-13) presenting to address:    Chief Complaint   Patient presents with   ??? Referral Request   ??? New Order     Scan       Vitals:    10/14/16 0834   BP: 110/68   Pulse: 99   Resp: 20   Temp: 98.4 ??F (36.9 ??C)   TempSrc: Oral   SpO2: 97%   Weight: 158 lb (71.7 kg)   Height: 5\' 8"  (1.727 m)   PainSc:   0 - No pain       Learning Assessment:     Learning Assessment 12/21/2013   PRIMARY LEARNER Patient   HIGHEST LEVEL OF EDUCATION - PRIMARY LEARNER  4 YEARS OF COLLEGE   BARRIERS PRIMARY LEARNER NONE   CO-LEARNER CAREGIVER No   PRIMARY LANGUAGE ENGLISH   INTERPRETER NEED No   LEARNER PREFERENCE PRIMARY DEMONSTRATION     DEMONSTRATION   ANSWERED BY Patient   RELATIONSHIP SELF     Depression Screening:     PHQ over the last two weeks 02/25/2016   Little interest or pleasure in doing things Not at all   Feeling down, depressed or hopeless Not at all   Total Score PHQ 2 0     Fall Risk Assessment:     Fall Risk Assessment, last 12 mths 08/06/2016   Able to walk? Yes   Fall in past 12 months? No     Abuse Screening:     Abuse Screening Questionnaire 06/18/2016   Do you ever feel afraid of your partner? N   Are you in a relationship with someone who physically or mentally threatens you? N   Is it safe for you to go home? Y     Coordination of Care Questionaire:   1. Have you been to the ER, urgent care clinic since your last visit?  Hospitalized since your last visit? YES. 09/30/16 [facility]    2. Have you seen or consulted any other health care providers outside of the [facility] since your last visit?  Include any pap smears or colon screening. NO    Advanced Directive:   1. Do you have an Advanced Directive? YES    2. Would you like information on Advanced Directives? NO

## 2016-10-31 ENCOUNTER — Encounter

## 2016-11-01 MED ORDER — CLONAZEPAM 0.5 MG TAB
0.5 mg | ORAL_TABLET | ORAL | 0 refills | Status: DC
Start: 2016-11-01 — End: 2017-01-30

## 2016-11-01 MED ORDER — LISINOPRIL 10 MG TAB
10 mg | ORAL_TABLET | ORAL | 0 refills | Status: DC
Start: 2016-11-01 — End: 2017-02-01

## 2016-11-01 NOTE — Telephone Encounter (Signed)
From: Maurice [name]  To: Daine GravelSusan Madoc Holquin, MD  Sent: 10/31/2016 6:32 PM EDT  Subject:  Medication Renewal Request    Original  authorizing provider: Daine GravelSusan Taylorann Tkach, MD    Maurice BraunBrady  Meigs would like a refill of the following medications:  lisinopril  (PRINIVIL, ZESTRIL) 10 mg tablet Daine Gravel[Sarah-Jane Nazario, MD]    Preferred  pharmacy: [phone] - [place], [place] - [address] [facility] [address]  [place]    Comment:

## 2016-12-16 MED ORDER — CLOPIDOGREL 75 MG TAB
75 mg | ORAL_TABLET | ORAL | 3 refills | Status: AC
Start: 2016-12-16 — End: ?

## 2016-12-17 ENCOUNTER — Encounter: Attending: Internal Medicine

## 2016-12-31 ENCOUNTER — Encounter: Attending: Internal Medicine

## 2017-01-14 ENCOUNTER — Encounter: Attending: Internal Medicine

## 2017-01-30 ENCOUNTER — Encounter

## 2017-01-31 MED ORDER — CLONAZEPAM 0.5 MG TAB
0.5 mg | ORAL_TABLET | ORAL | 0 refills | Status: DC
Start: 2017-01-31 — End: 2017-05-02

## 2017-02-01 ENCOUNTER — Encounter

## 2017-02-01 MED ORDER — GLUCAGON EMERGENCY KIT 1 MG SOLUTION FOR INJECTION
1 mg | PACK | INTRAMUSCULAR | 0 refills | Status: DC
Start: 2017-02-01 — End: 2017-03-15

## 2017-02-01 MED ORDER — LISINOPRIL 10 MG TAB
10 mg | ORAL_TABLET | ORAL | 0 refills | Status: DC
Start: 2017-02-01 — End: 2017-04-22

## 2017-02-23 MED ORDER — GABAPENTIN 300 MG CAP
300 mg | ORAL_CAPSULE | ORAL | 0 refills | Status: DC
Start: 2017-02-23 — End: 2017-05-22

## 2017-02-25 ENCOUNTER — Encounter: Attending: Internal Medicine

## 2017-02-27 ENCOUNTER — Emergency Department (HOSPITAL_COMMUNITY)
Admission: EM | Admit: 2017-02-27 | Discharge: 2017-03-01 | Disposition: A | Discharge status: Other Acute Inpatient Hospital | Payer: Managed Care, Other (non HMO) | Attending: Emergency Medicine | Admitting: Emergency Medicine

## 2017-02-27 ENCOUNTER — Ambulatory Visit (HOSPITAL_COMMUNITY)
Admission: RE | Admit: 2017-02-27 | Discharge: 2017-02-27 | Disposition: A | Discharge status: Home / Self Care | Payer: 59 | Attending: Psychiatry | Admitting: Psychiatry

## 2017-02-27 ENCOUNTER — Encounter (HOSPITAL_COMMUNITY): Payer: Self-pay | Admitting: Emergency Medicine

## 2017-02-27 DIAGNOSIS — Z87891 Personal history of nicotine dependence: Secondary | ICD-10-CM | POA: Insufficient documentation

## 2017-02-27 DIAGNOSIS — Z79899 Other long term (current) drug therapy: Secondary | ICD-10-CM | POA: Insufficient documentation

## 2017-02-27 DIAGNOSIS — E109 Type 1 diabetes mellitus without complications: Secondary | ICD-10-CM | POA: Insufficient documentation

## 2017-02-27 DIAGNOSIS — F4320 Adjustment disorder, unspecified: Secondary | ICD-10-CM | POA: Diagnosis not present

## 2017-02-27 DIAGNOSIS — F4323 Adjustment disorder with mixed anxiety and depressed mood: Secondary | ICD-10-CM | POA: Diagnosis present

## 2017-02-27 DIAGNOSIS — F39 Unspecified mood [affective] disorder: Secondary | ICD-10-CM | POA: Diagnosis not present

## 2017-02-27 DIAGNOSIS — Z0000 Encounter for general adult medical examination without abnormal findings: Secondary | ICD-10-CM

## 2017-02-27 DIAGNOSIS — I10 Essential (primary) hypertension: Secondary | ICD-10-CM | POA: Insufficient documentation

## 2017-02-27 DIAGNOSIS — R440 Auditory hallucinations: Secondary | ICD-10-CM | POA: Insufficient documentation

## 2017-02-27 DIAGNOSIS — Z794 Long term (current) use of insulin: Secondary | ICD-10-CM | POA: Insufficient documentation

## 2017-02-27 DIAGNOSIS — Z7902 Long term (current) use of antithrombotics/antiplatelets: Secondary | ICD-10-CM | POA: Insufficient documentation

## 2017-02-27 DIAGNOSIS — E119 Type 2 diabetes mellitus without complications: Secondary | ICD-10-CM | POA: Insufficient documentation

## 2017-02-27 HISTORY — DX: Essential (primary) hypertension: I10

## 2017-02-27 HISTORY — DX: Type 1 diabetes mellitus without complications: E10.9

## 2017-02-27 HISTORY — DX: Pure hypercholesterolemia, unspecified: E78.00

## 2017-02-27 LAB — COMPREHENSIVE METABOLIC PANEL
ALT: 23 U/L (ref 17–63)
ANION GAP: 8 (ref 5–15)
AST: 24 U/L (ref 15–41)
Albumin: 4 g/dL (ref 3.5–5.0)
Alkaline Phosphatase: 80 U/L (ref 38–126)
BUN: 12 mg/dL (ref 6–20)
CHLORIDE: 103 mmol/L (ref 101–111)
CO2: 26 mmol/L (ref 22–32)
Calcium: 9.2 mg/dL (ref 8.9–10.3)
Creatinine, Ser: 0.81 mg/dL (ref 0.61–1.24)
GFR calc non Af Amer: >60 mL/min (ref >60)
Glucose, Bld: 139 mg/dL — ABNORMAL HIGH (ref 65–99)
POTASSIUM: 3.9 mmol/L (ref 3.5–5.1)
Sodium: 137 mmol/L (ref 135–145)
Total Bilirubin: 0.8 mg/dL (ref 0.3–1.2)
Total Protein: 6.8 g/dL (ref 6.5–8.1)

## 2017-02-27 LAB — CBC WITH DIFFERENTIAL/PLATELET
Basophils Absolute: 0 10*3/uL (ref 0.0–0.1)
Basophils Relative: 1 %
EOS PCT: 2 %
Eosinophils Absolute: 0.1 10*3/uL (ref 0.0–0.7)
HEMATOCRIT: 43.5 % (ref 39.0–52.0)
Hemoglobin: 15.2 g/dL (ref 13.0–17.0)
LYMPHS ABS: 2.2 10*3/uL (ref 0.7–4.0)
LYMPHS PCT: 34 %
MCH: 31.7 pg (ref 26.0–34.0)
MCHC: 34.9 g/dL (ref 30.0–36.0)
MCV: 90.8 fL (ref 78.0–100.0)
Monocytes Absolute: 0.7 10*3/uL (ref 0.1–1.0)
Monocytes Relative: 10 %
NEUTROS ABS: 3.5 10*3/uL (ref 1.7–7.7)
Neutrophils Relative %: 53 %
PLATELETS: 214 10*3/uL (ref 150–400)
RBC: 4.79 MIL/uL (ref 4.22–5.81)
RDW: 12.7 % (ref 11.5–15.5)
WBC: 6.5 10*3/uL (ref 4.0–10.5)

## 2017-02-27 LAB — RAPID URINE DRUG SCREEN, HOSP PERFORMED
Amphetamines: NOT DETECTED
BENZODIAZEPINES: NOT DETECTED
Barbiturates: NOT DETECTED
Cocaine: NOT DETECTED
Opiates: NOT DETECTED
Tetrahydrocannabinol: NOT DETECTED

## 2017-02-27 LAB — SALICYLATE LEVEL: Salicylate Lvl: <7 mg/dL (ref 2.8–30.0)

## 2017-02-27 LAB — CBG MONITORING, ED
GLUCOSE-CAPILLARY: 246 mg/dL — AB (ref 65–99)
Glucose-Capillary: 130 mg/dL — ABNORMAL HIGH (ref 65–99)

## 2017-02-27 LAB — ACETAMINOPHEN LEVEL

## 2017-02-27 LAB — ETHANOL: Alcohol, Ethyl (B): <5 mg/dL (ref <5)

## 2017-02-27 MED ORDER — GABAPENTIN 300 MG PO CAPS
300.0000 mg | ORAL_CAPSULE | Freq: Every day | ORAL | Status: DC
  Administered 2017-02-27 – 2017-02-28 (×2): 300 mg via ORAL
  Filled 2017-02-27 (×2): qty 1

## 2017-02-27 MED ORDER — CLOPIDOGREL BISULFATE 75 MG PO TABS
75.0000 mg | ORAL_TABLET | Freq: Every evening | ORAL | Status: DC
  Administered 2017-02-27 – 2017-02-28 (×2): 75 mg via ORAL
  Filled 2017-02-27 (×2): qty 1

## 2017-02-27 MED ORDER — ATORVASTATIN CALCIUM 20 MG PO TABS
20.0000 mg | ORAL_TABLET | Freq: Every evening | ORAL | Status: DC
  Administered 2017-02-27 – 2017-02-28 (×2): 20 mg via ORAL
  Filled 2017-02-27 (×2): qty 1

## 2017-02-27 MED ORDER — TIMOLOL MALEATE 0.5 % OP SOLN
1.0000 [drp] | Freq: Two times a day (BID) | OPHTHALMIC | Status: DC
  Administered 2017-02-27 – 2017-02-28 (×3): 1 [drp] via OPHTHALMIC
  Filled 2017-02-27: qty 5

## 2017-02-27 MED ORDER — LISINOPRIL 5 MG PO TABS
5.0000 mg | ORAL_TABLET | Freq: Every evening | ORAL | Status: DC
  Administered 2017-02-27 – 2017-02-28 (×2): 5 mg via ORAL
  Filled 2017-02-27 (×2): qty 1

## 2017-02-27 MED ORDER — INSULIN ASPART 100 UNIT/ML ~~LOC~~ SOLN
0.0000 [IU] | Freq: Three times a day (TID) | SUBCUTANEOUS | Status: DC
  Filled 2017-02-27: qty 1

## 2017-02-27 MED ORDER — INSULIN ASPART 100 UNIT/ML ~~LOC~~ SOLN
0.0000 [IU] | Freq: Three times a day (TID) | SUBCUTANEOUS | Status: DC
  Administered 2017-02-27: 5 [IU] via SUBCUTANEOUS
  Administered 2017-02-28: 3 [IU] via SUBCUTANEOUS
  Administered 2017-02-28: 11 [IU] via SUBCUTANEOUS
  Filled 2017-02-27 (×3): qty 1

## 2017-02-27 MED ORDER — CLONAZEPAM 0.5 MG PO TABS
0.5000 mg | ORAL_TABLET | Freq: Two times a day (BID) | ORAL | Status: DC
  Administered 2017-02-27: 0.5 mg via ORAL
  Filled 2017-02-27: qty 1

## 2017-02-27 NOTE — H&P (Signed)
Behavioral Health Medical Screening Exam  Edgar Huynh is an 61 y.o. male arrived voluntarily accompanied by daughter with c/o auditory hallucination.  Please see [facility] assessment for details.  Total Time spent with patient: 30 minutes  Psychiatric Specialty Exam: Physical Exam  Vitals reviewed. Constitutional: He is oriented to person, place, and time. He appears well-developed and well-nourished.  HENT:  Head: Normocephalic and atraumatic.  Eyes: Pupils are equal, round, and reactive to light.  Neck: Normal range of motion.  Cardiovascular: Normal rate and regular rhythm.   Respiratory: Effort normal.  GI: Soft. Bowel sounds are normal.  Genitourinary:  Genitourinary Comments: Deferred  Musculoskeletal: Normal range of motion.  Neurological: He is alert and oriented to person, place, and time.  Skin: Skin is warm and dry.    Review of Systems  Psychiatric/Behavioral: Positive for depression and hallucinations. Negative for memory loss, substance abuse and suicidal ideas. The patient is nervous/anxious. The patient does not have insomnia.   All other systems reviewed and are negative.   Blood pressure (!) 143/94, pulse (!) 106, temperature 98.8 F (37.1 C), temperature source Oral, resp. rate 18, SpO2 98 %.There is no height or weight on file to calculate BMI.  General Appearance: Casual and Well Groomed  Eye Contact:  Good occasional twitches  Speech:  Clear and Coherent and Normal Rate  Volume:  Normal  Mood:  Anxious  Affect:  Congruent  Thought Process:  Coherent and Goal Directed  Orientation:  Full (Time, Place, and Person)  Thought Content:  WDL and Logical  Suicidal Thoughts:  No  Homicidal Thoughts:  No  Memory:  Immediate;   Good Recent;   Good Remote;   Fair  Judgement:  Good  Insight:  Good and Present  Psychomotor Activity:  Normal  Concentration: Concentration: Good and Attention Span: Good  Recall:  Good  Fund of Knowledge:Good  Language: Good   Akathisia:  Negative  Handed:  Right  AIMS (if indicated):     Assets:  Communication Skills Desire for Improvement Financial Resources/Insurance Housing Leisure Time  Sleep:       Musculoskeletal: Strength & Muscle Tone: within normal limits Gait & Station: normal Patient leans: N/A  Blood pressure (!) 143/94, pulse (!) 106, temperature 98.8 F (37.1 C), temperature source Oral, resp. rate 18, SpO2 98 %.  Recommendations:  Based on my evaluation the patient appears to have an emergency medical condition for which I recommend the patient be transferred to the emergency department for further evaluation.   Vicenta Aly, NP 02/27/2017, 6:01 PM

## 2017-02-27 NOTE — ED Triage Notes (Signed)
Pt sent from [facility] for medical clearance. Pt c/o auditory hallucinations of the Christian God speaking to him x 2 months. Voice does not tell him to harm himself or anyone else. No SI/HI/AVH. Pt reports on March 19 pt had 3 day long "salvation" experience where he felt multiple episodes of euphoric sensation of being saved from [facility] by God, pt was able to read the "red sections" of the Bible, which are Jesus' words, and understand them like he hadn't understood before. Pt felt that his negative biases and prejudices had been removed by God.

## 2017-02-27 NOTE — ED Notes (Signed)
SBAR Report received from previous nurse. Pt received calm and visible on unit. Pt denies current SI/ HI, A/V H, depression, anxiety, or pain at this time, and appears otherwise stable and free of distress. pt states that he "heard the voice of the all mighty, constantly. While I was in the hospital last week, it went away. Pt reports that he was on an insuline pump and has recently converted. Pt doesn't recall the exact date, but it was over a 3 days ago. Pt reports that he checks his BG hourly while awake and at 2 AM and self administers insuline several times daily. Pt facial tic noted while conversing.  Pt reminded of camera surveillance, q 15 min rounds, and rules of the milieu. Will continue to assess.

## 2017-02-27 NOTE — BH Assessment (Signed)
Clinician completed walk in assessment at [facility].   Clinician consulted with Zerita Boers, NP who recommends patient to be sent to [facility] for medical clearance and also recommends AM psych evaluation.   Rigoberto Noel, MSW, LCSW Triage Specialist [phone],

## 2017-02-27 NOTE — BH Assessment (Signed)
Tele Assessment Note   Edgar Huynh is an 61 y.o. male who presents as a walk-in to [facility] with wife and daughter, Altha Harm. Daughter was present for assessment. Patient reported on 7/12 that he became upset, "yelled ugly words" and cursed God after he was directed by God how to manage his diabetes and his guidance resulted in his sugar to be high that particular day. Patient's wife called 911 due to his verbal outbursts and he was admitted into [facility] in [place] for 2 days. Patient and daughter reported that hospital dx him with Psychosis and he was recommended for inpatient hospitalization. Family requested for hospital to allow patient to come to [facility] to be admitted where he has more family support.  Patient denies SI, HI, VH and SA. Patient denies history of inpatient and outpatient treatment. Patient and daughter deny self harming behaviors or aggression towards others. Daughter explained that his mood is labile at times but patient denies disturbance in eating or sleeping. Patient reported hearing God's voice for about a year. Patient stated that God helps him manage his diabetes as well as helps direct him in traffic. 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 Patient stated that he worries about his fiances and insurance when he losses his job.  Disposition: Clinician consulted with Zerita Boers, NP who recommends patient to be sent to [facility] for medical clearance and also recommends AM psych evaluation.    Diagnosis: Adjustment Disorder  Past Medical History: No past medical history on file.  No past surgical history on file.  Family History: No family history on file.  Social History:  has no tobacco, alcohol, and drug history on file.  Additional Social  History:  Alcohol / Drug Use Pain Medications: See MAR Prescriptions: See MAR Over the Counter: See MAR History of alcohol / drug use?: No history of alcohol / drug abuse  CIWA: CIWA-Ar BP: (!) 143/94 Pulse Rate: (!) 106 COWS:    PATIENT STRENGTHS: (choose at least two) Average or above average intelligence Capable of independent living Communication skills General fund of knowledge Motivation for treatment/growth Religious Affiliation Supportive family/friends Work skills  Allergies: Allergies not on file  Home Medications:  (Not in a hospital admission)  OB/GYN Status:  No LMP for male patient.  General Assessment Data Location of Assessment: [facility] TTS Assessment: In system Is this a Tele or Face-to-Face Assessment?: Face-to-Face Is this an Initial Assessment or a Re-assessment for this encounter?: Initial Assessment Marital status: Married Is patient pregnant?: No Pregnancy Status: No Living Arrangements: Spouse/significant other Can pt return to current living arrangement?: Yes Admission Status: Voluntary Is patient capable of signing voluntary admission?: Yes Referral Source: Self/Family/Friend Insurance type: Ship broker Exam ([place]) Medical Exam completed: Yes  Crisis Care Plan Living Arrangements: Spouse/significant other Legal Guardian:  (NA) Name of Psychiatrist: none Name of Therapist: none     Risk to self with the past 6 months Suicidal Ideation: No Has patient been a risk to self within the past 6 months prior to admission? : No Suicidal Intent: No Has patient had any suicidal intent within the past 6 months prior to admission? : No Is patient at risk for suicide?: No Suicidal Plan?: No Has patient had any suicidal plan within the  past 6 months prior to admission? : No Access to Means: No What has been your use of drugs/alcohol within the last 12 months?: none reported Previous Attempts/Gestures:  No How many times?: 0 Other Self Harm Risks: none reported Triggers for Past Attempts: None known Intentional Self Injurious Behavior: None Family Suicide History: Unknown Recent stressful life event(s): Loss (Comment), Financial Problems, Other (Comment) (Father died 2 months ago, job closing, wife health issues) Persecutory voices/beliefs?: Yes Depression: No Depression Symptoms: Feeling angry/irritable Substance abuse history and/or treatment for substance abuse?: No Suicide prevention information given to non-admitted patients: Not applicable  Risk to Others within the past 6 months Homicidal Ideation: No Does patient have any lifetime risk of violence toward others beyond the six months prior to admission? : No Thoughts of Harm to Others: No Current Homicidal Intent: No Current Homicidal Plan: No Access to Homicidal Means: No Identified Victim: NA History of harm to others?: No Assessment of Violence: None Noted Violent Behavior Description: NA Does patient have access to weapons?: No Criminal Charges Pending?: No Does patient have a court date: No Is patient on probation?: No  Psychosis Hallucinations: Auditory Delusions: Persecutory  Mental Status Report Appearance/Hygiene: Unremarkable Eye Contact: Good Motor Activity: Unremarkable Speech: Logical/coherent Level of Consciousness: Alert Mood: Pleasant Affect: Other (Comment) (Pleasant) Anxiety Level: Minimal Thought Processes: Coherent, Relevant Judgement: Unimpaired Orientation: Person, Place, Time, Situation Obsessive Compulsive Thoughts/Behaviors: Severe  Cognitive Functioning Concentration: Normal Memory: Recent Intact, Remote Intact IQ: Average Insight: Fair Impulse Control: Good Appetite: Good Weight Loss: 0 Weight Gain: 0 Sleep: No Change Total Hours of Sleep:  (unk) Vegetative Symptoms: None  ADLScreening [facility] Assessment Services) Patient's cognitive ability adequate to safely complete daily  activities?: Yes Patient able to express need for assistance with ADLs?: Yes Independently performs ADLs?: No  Prior Inpatient Therapy Prior Inpatient Therapy: No Prior Therapy Dates: NA Prior Therapy Facilty/Provider(s): NA Reason for Treatment: NA  Prior Outpatient Therapy Prior Outpatient Therapy: No Prior Therapy Dates: NA Prior Therapy Facilty/Provider(s): NA Reason for Treatment: NA Does patient have an ACCT team?: No Does patient have Intensive In-House Services?  : No Does patient have [facility] services? : No Does patient have P4CC services?: No  ADL Screening (condition at time of admission) Patient's cognitive ability adequate to safely complete daily activities?: Yes Is the patient deaf or have difficulty hearing?: No Does the patient have difficulty seeing, even when wearing glasses/contacts?: No Does the patient have difficulty concentrating, remembering, or making decisions?: No Patient able to express need for assistance with ADLs?: Yes Does the patient have difficulty dressing or bathing?: No Independently performs ADLs?: No Does the patient have difficulty walking or climbing stairs?: No       Abuse/Neglect Assessment (Assessment to be complete while patient is alone) Physical Abuse: Denies Verbal Abuse: Denies Sexual Abuse: Denies Exploitation of patient/patient's resources: Denies Self-Neglect: Denies     Regulatory affairs officer (For Healthcare) Does Patient Have a Medical Advance Directive?: No Nutrition Screen- [facility] Adult/WL/AP Patient's home diet: Regular Has the patient been eating poorly because of a decreased appetite?: No  Additional Information 1:1 In Past 12 Months?: No CIRT Risk: No Elopement Risk: No Does patient have medical clearance?: No     Disposition:  Disposition Initial Assessment Completed for this Encounter: Yes Disposition of Patient: Other dispositions Other disposition(s): Other (Comment) (referred to AM psych  evaluation)  Essie Christine 02/27/2017 6:43 PM

## 2017-02-27 NOTE — ED Notes (Addendum)
Pt stated "I have a lot of stressors in my life.  My dad died x 2 months ago.  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  Every day for a year I am hearing God talk to me.  When I was @ [employer], there have been things that have caused me to get upset, not upset with anybody but I got upset with God.  I didn't understand what it was He was trying to tell me.  Later, He told me He was trying to calm me down.  He calls me by name.  When I was @ [employer], one of the things I noticed was these very distinct powerful voices had become almost a whisper that I could almost not detect them and by the time we left Saturday, I didn't hear them.  I have been angry over the past year but not wanting to hurt anyone or myself.  After my dad died, my mom got sick and I was afraid I was going to lose her.  My wife has chronic back issues and is a lot of pain so I do all the house work, yard work and everything.  She's on disability.  My mother actually got 2 cracked ribs trying to help my dad before he died and the pain meds caused such a problem she had to be put in the hospital because she was so stopped up.  They had to give her morphine and dig it out.  Then, my daughter that brought me here just found out they are losing their house because it's being sold and her husband is losing his job.  My daughter brought me here because my mother is in [place] and my mother-in-law is in [place].  My son is in [place].  I am no longer on the insulin pump because I'm losing my insurance so I'm back on shots.  I check my BS every hour.  I have diabetic socks and powder that I have to have."    Pt came back wearing his tennis shoes stating "my daughter has my slippers."

## 2017-02-27 NOTE — ED Provider Notes (Signed)
[facility] DEPT Provider Note   CSN: [id number] Arrival date & time: 02/27/17  [place]     History   Chief Complaint Chief Complaint  Patient presents with  . Hallucinations    HPI Edgar Huynh is a 61 y.o. male.  HPI  C66 37-year-old male with a history of hypertension, high cholesterol, type 1 diabetes who presents to the ED from behavioral health for medical clearance. He was sent to behavioral health for auditory hallucinations for one year. Patient reports that he hears the voice of God. He reports that he was "saved on March 19 of last year" and reports that his "salvation" lasted for about 3 days. 2 weeks after his "salivation" he started hearing the voice of God. He reports that the voice does not tell him to hurt himself or others. He is currently denying any suicidal ideation, homicidal ideation or visual hallucinations. He has no prior psychiatric diseases/disorders. He reports that he was seen at an outside hospital several days ago and since then the voice has now stopped. Denies any physical complaints at this time.   Past Medical History:  Diagnosis Date  . Hypercholesteremia   . Hypertension   . Type 1 diabetes ([place])     There are no active problems to display for this patient.   Past Surgical History:  Procedure Laterality Date  . CAROTID ARTERY ANGIOPLASTY         Home Medications    Prior to Admission medications   Medication Sig Start Date End Date Taking? Authorizing Provider  ALPHA LIPOIC ACID PO Take 1 capsule by mouth daily.   Yes [provider]  atorvastatin (LIPITOR) 20 MG tablet Take 20 mg by mouth every evening.    Yes [provider]  clonazePAM (KLONOPIN) 0.5 MG tablet Take 0.5 mg by mouth 2 (two) times daily.   Yes [provider]  clopidogrel (PLAVIX) 75 MG tablet Take 75 mg by mouth every evening.   Yes [provider]  co-enzyme Q-10 30 MG capsule Take 30 mg by mouth daily.   Yes [provider]  gabapentin (NEURONTIN) 300 MG capsule Take 300 mg by mouth at bedtime.   Yes [provider]  hydrocortisone cream 1 % Apply 1 application topically 4 (four) times daily. Pt applies to face.   Yes [provider]  insulin lispro (HUMALOG) 100 UNIT/ML injection Inject into the skin See admin instructions. Pt states that he bases how much he uses off of an algorithm.   Yes [provider]  lisinopril (PRINIVIL,ZESTRIL) 10 MG tablet Take 5 mg by mouth every evening.    Yes [provider]  Multiple Vitamin (MULTIVITAMIN WITH MINERALS) TABS tablet Take 1 tablet by mouth daily.   Yes [provider]  omeprazole (PRILOSEC) 40 MG capsule Take 40 mg by mouth daily.   Yes [provider]  timolol (TIMOPTIC) 0.5 % ophthalmic solution Place 1 drop into both eyes 2 (two) times daily.   Yes [provider]    Family History No family history on file.  Social History Social History  Substance Use Topics  . Smoking status: Former Research scientist (life sciences)  . Smokeless tobacco: Not on file  . Alcohol use Yes     Comment: occasional     Allergies   Patient has no known allergies.   Review of Systems Review of Systems All other systems are reviewed and are negative for acute change except as noted in the HPI   Physical  Exam Updated Vital Signs BP (!) 144/81 (BP Location: Left Arm)   Pulse 94   Temp 98.2 F (36.8 C) (Oral)   Resp 18   SpO2 100%   Physical Exam  Constitutional: He is oriented to person, place, and time. He appears well-developed and well-nourished. No distress.  HENT:  Head: Normocephalic and atraumatic.  Nose: Nose normal.  Eyes: Pupils are equal, round, and reactive to light. Conjunctivae and EOM are normal. Right eye exhibits no discharge. Left eye exhibits no discharge. No scleral icterus.  Neck: Normal range of motion. Neck supple.  Cardiovascular: Normal rate and regular rhythm.  Exam reveals no gallop and  no friction rub.   No murmur heard. Pulmonary/Chest: Effort normal and breath sounds normal. No stridor. No respiratory distress. He has no rales.  Abdominal: Soft. He exhibits no distension. There is no tenderness.  Musculoskeletal: He exhibits no edema or tenderness.  Neurological: He is alert and oriented to person, place, and time.  Skin: Skin is warm and dry. No rash noted. He is not diaphoretic. No erythema.  Psychiatric: He has a normal mood and affect. His speech is rapid and/or pressured. He is not actively hallucinating. Thought content is not paranoid and not delusional. He expresses no homicidal and no suicidal ideation. He expresses no suicidal plans and no homicidal plans. He is attentive.  Vitals reviewed.    ED Treatments / Results  Labs (all labs ordered are listed, but only abnormal results are displayed) Labs Reviewed  COMPREHENSIVE METABOLIC PANEL - Abnormal; Notable for the following:       Result Value   Glucose, Bld 139 (*)    All other components within normal limits  ACETAMINOPHEN LEVEL - Abnormal; Notable for the following:    Acetaminophen (Tylenol), Serum <10 (*)    All other components within normal limits  CBG MONITORING, ED - Abnormal; Notable for the following:    Glucose-Capillary 130 (*)    All other components within normal limits  ETHANOL  RAPID URINE DRUG SCREEN, HOSP PERFORMED  CBC WITH DIFFERENTIAL/PLATELET  SALICYLATE LEVEL  CBG MONITORING, ED    EKG  EKG Interpretation None       Radiology No results found.  Procedures Procedures (including critical care time)  Medications Ordered in ED Medications  atorvastatin (LIPITOR) tablet 20 mg (not administered)  clonazePAM (KLONOPIN) tablet 0.5 mg (not administered)  clopidogrel (PLAVIX) tablet 75 mg (not administered)  gabapentin (NEURONTIN) capsule 300 mg (not administered)  lisinopril (PRINIVIL,ZESTRIL) tablet 5 mg (not administered)  timolol (TIMOPTIC) 0.5 % ophthalmic solution  1 drop (not administered)  insulin aspart (novoLOG) injection 0-15 Units (not administered)     Initial Impression / Assessment and Plan / ED Course  I have reviewed the triage vital signs and the nursing notes.  Pertinent labs & imaging results that were available during my care of the patient were reviewed by me and considered in my medical decision making (see chart for details).     Auditory hallucinations. Patient already evaluated by behavioral health who recommended psychiatric evaluation in the morning. Labs grossly reassuring. Patient is medically cleared.  Final Clinical Impressions(s) / ED Diagnoses   Final diagnoses:  Auditory hallucination      Fatima Blank, MD 02/27/17 2158

## 2017-02-27 NOTE — ED Notes (Signed)
Bed: WBH42 Expected date:  Expected time:  Means of arrival:  Comments: 28 

## 2017-02-27 NOTE — ED Notes (Signed)
Pt provided [place] sandwich, cheese & diet caffeine free Coke.

## 2017-02-28 ENCOUNTER — Emergency Department (HOSPITAL_COMMUNITY): Payer: Managed Care, Other (non HMO)

## 2017-02-28 DIAGNOSIS — R440 Auditory hallucinations: Secondary | ICD-10-CM | POA: Diagnosis not present

## 2017-02-28 DIAGNOSIS — F4323 Adjustment disorder with mixed anxiety and depressed mood: Secondary | ICD-10-CM

## 2017-02-28 DIAGNOSIS — F39 Unspecified mood [affective] disorder: Secondary | ICD-10-CM | POA: Diagnosis not present

## 2017-02-28 DIAGNOSIS — Z87891 Personal history of nicotine dependence: Secondary | ICD-10-CM | POA: Diagnosis not present

## 2017-02-28 LAB — URINALYSIS, COMPLETE (UACMP) WITH MICROSCOPIC
Bacteria, UA: NONE SEEN
Bilirubin Urine: NEGATIVE
HGB URINE DIPSTICK: NEGATIVE
KETONES UR: 5 mg/dL — AB
LEUKOCYTES UA: NEGATIVE
Nitrite: NEGATIVE
PH: 7 (ref 5.0–8.0)
PROTEIN: NEGATIVE mg/dL
RBC / HPF: NONE SEEN RBC/hpf (ref 0–5)
SQUAMOUS EPITHELIAL / LPF: NONE SEEN
Specific Gravity, Urine: 1.01 (ref 1.005–1.030)

## 2017-02-28 LAB — CBG MONITORING, ED
GLUCOSE-CAPILLARY: 312 mg/dL — AB (ref 65–99)
GLUCOSE-CAPILLARY: 228 mg/dL — AB (ref 65–99)
GLUCOSE-CAPILLARY: 128 mg/dL — AB (ref 65–99)
Glucose-Capillary: 265 mg/dL — ABNORMAL HIGH (ref 65–99)
Glucose-Capillary: 168 mg/dL — ABNORMAL HIGH (ref 65–99)
Glucose-Capillary: 296 mg/dL — ABNORMAL HIGH (ref 65–99)
Glucose-Capillary: 78 mg/dL (ref 65–99)
Glucose-Capillary: 232 mg/dL — ABNORMAL HIGH (ref 65–99)

## 2017-02-28 MED ORDER — CARBAMAZEPINE ER 200 MG PO TB12: 200.0000 mg | ORAL_TABLET | Freq: Two times a day (BID) | ORAL | Status: DC

## 2017-02-28 MED ORDER — INSULIN ASPART 100 UNIT/ML ~~LOC~~ SOLN
5.0000 [IU] | Freq: Once | SUBCUTANEOUS | Status: AC
  Administered 2017-02-28: 5 [IU] via SUBCUTANEOUS

## 2017-02-28 MED ORDER — INSULIN GLARGINE 100 UNIT/ML ~~LOC~~ SOLN
15.0000 [IU] | Freq: Every day | SUBCUTANEOUS | Status: DC
  Administered 2017-02-28: 15 [IU] via SUBCUTANEOUS
  Filled 2017-02-28: qty 0.15

## 2017-02-28 MED ORDER — HYDROXYZINE HCL 25 MG PO TABS
25.0000 mg | ORAL_TABLET | Freq: Three times a day (TID) | ORAL | Status: DC | PRN
  Administered 2017-02-28 (×2): 25 mg via ORAL
  Filled 2017-02-28 (×2): qty 1

## 2017-02-28 MED ORDER — LURASIDONE HCL 40 MG PO TABS
40.0000 mg | ORAL_TABLET | Freq: Every day | ORAL | Status: DC
  Administered 2017-02-28: 40 mg via ORAL
  Filled 2017-02-28: qty 1

## 2017-02-28 MED ORDER — INSULIN ASPART 100 UNIT/ML ~~LOC~~ SOLN
5.0000 [IU] | Freq: Three times a day (TID) | SUBCUTANEOUS | Status: DC
  Administered 2017-02-28: 5 [IU] via SUBCUTANEOUS
  Filled 2017-02-28: qty 1

## 2017-02-28 MED ORDER — INSULIN ASPART 100 UNIT/ML ~~LOC~~ SOLN
0.0000 [IU] | Freq: Three times a day (TID) | SUBCUTANEOUS | Status: DC
  Administered 2017-02-28: 1 [IU] via SUBCUTANEOUS
  Filled 2017-02-28: qty 1

## 2017-02-28 NOTE — ED Notes (Signed)
Pt has been complaint with medication regimen this shift, pt presents with sad affect, irritable at times, isolating self in room. Pt denies SI/HI. Encouragement and support provided. Special checks q 15 mins in place for safety, Video monitoring in place. Will continue to monitor.

## 2017-02-28 NOTE — ED Notes (Signed)
EKG  Completed, given to [facility] for review.

## 2017-02-28 NOTE — ED Notes (Signed)
Diabetic coordinator at bedside.  

## 2017-02-28 NOTE — ED Notes (Signed)
Called [facility] to give report and time to send patient. They report that they will call back when ready.

## 2017-02-28 NOTE — ED Notes (Signed)
This nurse notified EDP Dr. Rex Kras of CBG 296 mg/dl. No new orders given at this time. Will continue to monitor.

## 2017-02-28 NOTE — ED Notes (Signed)
Patient has been resting in room all shift. Cooperative with medication and CBG. No SI/HI but doesn't say anything when asking about depression and hearing voices. Did report he usually takes klonopin at bedtime but not ordered so he said he would take vistaril prn. He is holding for transport to [facility] when they are ready.

## 2017-02-28 NOTE — Progress Notes (Signed)
Inpatient Diabetes Program Recommendations  AACE/ADA: New Consensus Statement on Inpatient Glycemic Control (2015)  Target Ranges:  Prepandial:   less than 140 mg/dL      Peak postprandial:   less than 180 mg/dL (1-2 hours)      Critically ill patients:  140 - 180 mg/dL   Lab Results  Component Value Date   GLUCAP 228 (H) 02/28/2017    Review of Glycemic Control  Diabetes history: DM1 Outpatient Diabetes medications: insulin pump  Current orders for Inpatient glycemic control: Novolog 0-15 units tidwc  Spoke with pt regarding his insulin pump settings. Could not remember what his pump settings are. Pt states he cannot afford supplies for his pump, therefore he had to remove it. States he checks his blood sugars every 1-2 hours and inputs BG into insulin pump. Pump says amount to administer and he injects Humalog with a needle. When asked who gave him these instructions, he replied, "Himself." Tried to discuss basal/bolus insulin and he was not interested in discussing.  Inpatient Diabetes Program Recommendations:    Lantus 15 units Q24H Novolog 5 units tidwc for meal coverage insulin (if pt eats > 50% meal) Decrease Novolog to 0-9 units tidwc with HS coverage  Will follow closely blood sugar trends.  Thank you. Lorenda Peck, RD, LDN, CDE Inpatient Diabetes Coordinator [phone]

## 2017-02-28 NOTE — Consult Note (Signed)
Edgar Huynh   Reason for Huynh:  Psychiatric evaluation Referring Physician:  EDP Patient Identification: Edgar Huynh MRN:  [id number] Principal Diagnosis: Adjustment disorder with mixed anxiety and depressed mood Diagnosis:   Patient Active Problem List   Diagnosis Date Noted  . Adjustment disorder with mixed anxiety and depressed mood [F43.23] 02/28/2017    Priority: Medium  . 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 male patient admitted with auditory hallucinations and stressed out  HPI: Patient who reports history of Hypertension, high cholesterol, type 1 diabetes but denies prior history of mental illness until he was diagnosed with psychosis few days ago at a hospital in [place]. Patient reports that he has been hearing the voices of God for the past one year. He reports that "God told me he will cure my diabetes.'' He states that he has been overwhelmed and under a lot of stress lately, ''my father died recently, my wife is disabled, I am about to lose my Job and my Diabetes id not getting better.'' Patient reports feeling depressed, anxious, apprehensive, hopeless and worry a lot. He denies delusional thinking , suicidal and homicidal thoughts.   Past Psychiatric History: no prior history of mental illness  Risk to Self: Is patient at risk for suicide?: No Risk to Others:   Prior Inpatient Therapy:   Prior Outpatient Therapy:    Past Medical History:  Past Medical History:  Diagnosis Date  . Hypercholesteremia   . Hypertension   . Type 1 diabetes [facility])     Past Surgical History:  Procedure Laterality Date  . CAROTID ARTERY ANGIOPLASTY     Family History: No family history on file. Family Psychiatric  History:  Social History:  History  Alcohol Use  . Yes    Comment: occasional     History  Drug Use No    Social History   Social History   . Marital status: Married    Spouse name: N/A  . Number of children: N/A  . Years of education: N/A   Social History Main Topics  . Smoking status: Former Research scientist (life sciences)  . Smokeless tobacco: None  . Alcohol use Yes     Comment: occasional  . Drug use: No  . Sexual activity: Not Asked   Other Topics Concern  . None   Social History Narrative  . None   Additional Social History:    Allergies:  No Known Allergies  Labs:  Results for orders placed or performed during the hospital encounter of 02/27/17 (from the past 48 hour(s))  Urine rapid drug screen (hosp performed)     Status: None   Collection Time: 02/27/17  8:19 PM  Result Value Ref Range   Opiates NONE DETECTED NONE DETECTED   Cocaine NONE DETECTED NONE DETECTED   Benzodiazepines NONE DETECTED NONE DETECTED   Amphetamines NONE DETECTED NONE DETECTED   Tetrahydrocannabinol NONE DETECTED NONE DETECTED   Barbiturates NONE DETECTED NONE DETECTED    Comment:        DRUG SCREEN FOR MEDICAL PURPOSES ONLY.  IF CONFIRMATION IS NEEDED FOR ANY PURPOSE, NOTIFY LAB WITHIN 5 DAYS.        LOWEST DETECTABLE LIMITS FOR URINE DRUG SCREEN Drug Class       Cutoff (ng/mL) Amphetamine      1000 Barbiturate      200 Benzodiazepine   283 Tricyclics  300 Opiates          300 Cocaine          300 THC              50   CBG monitoring, ED     Status: Abnormal   Collection Time: 02/27/17  8:47 PM  Result Value Ref Range   Glucose-Capillary 130 (H) 65 - 99 mg/dL  Comprehensive metabolic panel     Status: Abnormal   Collection Time: 02/27/17  9:00 PM  Result Value Ref Range   Sodium 137 135 - 145 mmol/L   Potassium 3.9 3.5 - 5.1 mmol/L   Chloride 103 101 - 111 mmol/L   CO2 26 22 - 32 mmol/L   Glucose, Bld 139 (H) 65 - 99 mg/dL   BUN 12 6 - 20 mg/dL   Creatinine, Ser 0.81 0.61 - 1.24 mg/dL   Calcium 9.2 8.9 - 10.3 mg/dL   Total Protein 6.8 6.5 - 8.1 g/dL   Albumin 4.0 3.5 - 5.0 g/dL   AST 24 15 - 41 U/L   ALT 23 17 - 63 U/L    Alkaline Phosphatase 80 38 - 126 U/L   Total Bilirubin 0.8 0.3 - 1.2 mg/dL   GFR calc non Af Amer >60 >60 mL/min   GFR calc Af Amer >60 >60 mL/min    Comment: (NOTE) The eGFR has been calculated using the CKD EPI equation. This calculation has not been validated in all clinical situations. eGFR's persistently <60 mL/min signify possible Chronic Kidney Disease.    Anion gap 8 5 - 15  Ethanol     Status: None   Collection Time: 02/27/17  9:00 PM  Result Value Ref Range   Alcohol, Ethyl (B) <5 <5 mg/dL    Comment:        LOWEST DETECTABLE LIMIT FOR SERUM ALCOHOL IS 5 mg/dL FOR MEDICAL PURPOSES ONLY   CBC with Diff     Status: None   Collection Time: 02/27/17  9:00 PM  Result Value Ref Range   WBC 6.5 4.0 - 10.5 K/uL   RBC 4.79 4.22 - 5.81 MIL/uL   Hemoglobin 15.2 13.0 - 17.0 g/dL   HCT 43.5 39.0 - 52.0 %   MCV 90.8 78.0 - 100.0 fL   MCH 31.7 26.0 - 34.0 pg   MCHC 34.9 30.0 - 36.0 g/dL   RDW 12.7 11.5 - 15.5 %   Platelets 214 150 - 400 K/uL   Neutrophils Relative % 53 %   Neutro Abs 3.5 1.7 - 7.7 K/uL   Lymphocytes Relative 34 %   Lymphs Abs 2.2 0.7 - 4.0 K/uL   Monocytes Relative 10 %   Monocytes Absolute 0.7 0.1 - 1.0 K/uL   Eosinophils Relative 2 %   Eosinophils Absolute 0.1 0.0 - 0.7 K/uL   Basophils Relative 1 %   Basophils Absolute 0.0 0.0 - 0.1 K/uL  Salicylate level     Status: None   Collection Time: 02/27/17  9:00 PM  Result Value Ref Range   Salicylate Lvl <0.2 2.8 - 30.0 mg/dL  Acetaminophen level     Status: Abnormal   Collection Time: 02/27/17  9:00 PM  Result Value Ref Range   Acetaminophen (Tylenol), Serum <10 (L) 10 - 30 ug/mL    Comment:        THERAPEUTIC CONCENTRATIONS VARY SIGNIFICANTLY. A RANGE OF 10-30 ug/mL MAY BE AN EFFECTIVE CONCENTRATION FOR MANY PATIENTS. HOWEVER, SOME ARE BEST TREATED AT CONCENTRATIONS OUTSIDE  THIS RANGE. ACETAMINOPHEN CONCENTRATIONS >150 ug/mL AT 4 HOURS AFTER INGESTION AND >50 ug/mL AT 12 HOURS AFTER INGESTION  ARE OFTEN ASSOCIATED WITH TOXIC REACTIONS.   CBG monitoring, ED     Status: Abnormal   Collection Time: 02/27/17 10:40 PM  Result Value Ref Range   Glucose-Capillary 246 (H) 65 - 99 mg/dL  CBG monitoring, ED     Status: Abnormal   Collection Time: 02/28/17  4:08 AM  Result Value Ref Range   Glucose-Capillary 265 (H) 65 - 99 mg/dL  CBG monitoring, ED     Status: Abnormal   Collection Time: 02/28/17  7:42 AM  Result Value Ref Range   Glucose-Capillary 168 (H) 65 - 99 mg/dL  CBG monitoring, ED     Status: Abnormal   Collection Time: 02/28/17 10:32 AM  Result Value Ref Range   Glucose-Capillary 296 (H) 65 - 99 mg/dL    Current Facility-Administered Medications  Medication Dose Route Frequency Provider Last Rate Last Dose  . atorvastatin (LIPITOR) tablet 20 mg  20 mg Oral QPM Cardama, Grayce Sessions, MD   20 mg at 02/27/17 2255  . clopidogrel (PLAVIX) tablet 75 mg  75 mg Oral QPM Cardama, Grayce Sessions, MD   75 mg at 02/27/17 2256  . gabapentin (NEURONTIN) capsule 300 mg  300 mg Oral QHS Cardama, Grayce Sessions, MD   300 mg at 02/27/17 2256  . insulin aspart (novoLOG) injection 0-15 Units  0-15 Units Subcutaneous TID AC & HS Lacretia Leigh, MD   3 Units at 02/28/17 [phone]  . lisinopril (PRINIVIL,ZESTRIL) tablet 5 mg  5 mg Oral QPM Cardama, Grayce Sessions, MD   5 mg at 02/27/17 2256  . lurasidone (LATUDA) tablet 40 mg  40 mg Oral QHS Gram Siedlecki, MD      . timolol (TIMOPTIC) 0.5 % ophthalmic solution 1 drop  1 drop Both Eyes BID Cardama, Grayce Sessions, MD   1 drop at 02/28/17 1019   Current Outpatient Prescriptions  Medication Sig Dispense Refill  . ALPHA LIPOIC ACID PO Take 1 capsule by mouth daily.    Marland Kitchen atorvastatin (LIPITOR) 20 MG tablet Take 20 mg by mouth every evening.     . clonazePAM (KLONOPIN) 0.5 MG tablet Take 0.5 mg by mouth 2 (two) times daily.    . clopidogrel (PLAVIX) 75 MG tablet Take 75 mg by mouth every evening.    Marland Kitchen co-enzyme Q-10 30 MG capsule Take 30 mg by mouth  daily.    Marland Kitchen gabapentin (NEURONTIN) 300 MG capsule Take 300 mg by mouth at bedtime.    . hydrocortisone cream 1 % Apply 1 application topically 4 (four) times daily. Pt applies to face.    . insulin lispro (HUMALOG) 100 UNIT/ML injection Inject into the skin See admin instructions. Pt states that he bases how much he uses off of an algorithm.    Marland Kitchen lisinopril (PRINIVIL,ZESTRIL) 10 MG tablet Take 5 mg by mouth every evening.     . Multiple Vitamin (MULTIVITAMIN WITH MINERALS) TABS tablet Take 1 tablet by mouth daily.    Marland Kitchen omeprazole (PRILOSEC) 40 MG capsule Take 40 mg by mouth daily.    . timolol (TIMOPTIC) 0.5 % ophthalmic solution Place 1 drop into both eyes 2 (two) times daily.      Musculoskeletal: Strength & Muscle Tone: within normal limits Gait & Station: normal Patient leans: N/A  Psychiatric Specialty Exam: Physical Exam  Psychiatric: His speech is normal. Judgment and thought content normal. His mood appears anxious. He is  agitated and actively hallucinating. Cognition and memory are normal. He exhibits a depressed mood.    Review of Systems  Constitutional: Negative.   HENT: Negative.   Eyes: Negative.   Respiratory: Negative.   Cardiovascular: Negative.   Gastrointestinal: Negative.   Genitourinary: Negative.   Musculoskeletal: Negative.   Skin: Negative.   Neurological: Negative.   Endo/Heme/Allergies: Negative.   Psychiatric/Behavioral: Positive for depression and hallucinations. The patient is nervous/anxious.     Blood pressure 104/60, pulse 82, temperature 98.8 F (37.1 C), temperature source Oral, resp. rate 16, SpO2 100 %.There is no height or weight on file to calculate BMI.  General Appearance: Casual  Eye Contact:  Good  Speech:  Clear and Coherent  Volume:  Increased  Mood:  Anxious and Irritable  Affect:  Constricted  Thought Process:  Coherent  Orientation:  Full (Time, Place, and Person)  Thought Content:  Hallucinations: Auditory  Suicidal Thoughts:   No  Homicidal Thoughts:  No  Memory:  Immediate;   Fair Recent;   Fair Remote;   Fair  Judgement:  Poor  Insight:  Shallow  Psychomotor Activity:  Restlessness  Concentration:  Concentration: Fair and Attention Span: Fair  Recall:  Good  Fund of Knowledge:  Fair  Language:  Good  Akathisia:  No  Handed:  Right  AIMS (if indicated):     Assets:  Communication Skills Desire for Improvement Social Support  ADL's:  Intact  Cognition:  WNL  Sleep:   poor     Treatment Plan Summary: Daily contact with patient to assess and evaluate symptoms and progress in treatment and Medication management Increase Latuda to 40 mg at bedtime for mood and psychosis Start Hydroxyzine 25 mg tid prn for anxiety  Disposition: Recommend psychiatric Inpatient admission when medically cleared.  Corena Pilgrim, MD 02/28/2017 10:51 AM

## 2017-02-28 NOTE — ED Notes (Signed)
This nurse notified EDP of diabetic coordinator recommendations.

## 2017-02-28 NOTE — Progress Notes (Signed)
02/28/17 1340:  LRT went to pt room, introduced self and offered activities.  Pt was lying down with eyes closed but awake.  Pt declined activities.  Victorino Sparrow, LRT/CTRS

## 2017-02-28 NOTE — BH Assessment (Signed)
[facility] Assessment Progress Note  Per Corena Pilgrim, MD, this pt requires psychiatric hospitalization at this time.  Leonia Reader, RN, [facility] has assigned pt to [facility] Rm [id number]; they will call when they are ready to receive pt.  Pt has signed Voluntary Admission and Consent for Treatment, as well as Consent to Release Information to no one, and signed forms have been faxed to [facility].  Pt's nurse has been notified, and agrees to send original paperwork along with pt via [place], and to call report to [phone].  Jalene Mullet, [place] Triage Specialist [phone]

## 2017-03-01 ENCOUNTER — Inpatient Hospital Stay (HOSPITAL_COMMUNITY)
Admission: EM | Admit: 2017-03-01 | Discharge: 2017-03-07 | DRG: 885 | Disposition: A | Discharge status: Home / Self Care | Payer: 59 | Source: Intra-hospital | Attending: Psychiatry | Admitting: Psychiatry

## 2017-03-01 ENCOUNTER — Encounter (HOSPITAL_COMMUNITY): Payer: Self-pay

## 2017-03-01 DIAGNOSIS — E7800 Pure hypercholesterolemia, unspecified: Secondary | ICD-10-CM | POA: Diagnosis present

## 2017-03-01 DIAGNOSIS — Z79899 Other long term (current) drug therapy: Secondary | ICD-10-CM

## 2017-03-01 DIAGNOSIS — T43215A Adverse effect of selective serotonin and norepinephrine reuptake inhibitors, initial encounter: Secondary | ICD-10-CM | POA: Diagnosis present

## 2017-03-01 DIAGNOSIS — G4700 Insomnia, unspecified: Secondary | ICD-10-CM | POA: Diagnosis present

## 2017-03-01 DIAGNOSIS — R531 Weakness: Secondary | ICD-10-CM | POA: Diagnosis present

## 2017-03-01 DIAGNOSIS — I658 Occlusion and stenosis of other precerebral arteries: Secondary | ICD-10-CM | POA: Diagnosis present

## 2017-03-01 DIAGNOSIS — M791 Myalgia: Secondary | ICD-10-CM | POA: Diagnosis present

## 2017-03-01 DIAGNOSIS — F323 Major depressive disorder, single episode, severe with psychotic features: Principal | ICD-10-CM | POA: Diagnosis present

## 2017-03-01 DIAGNOSIS — I249 Acute ischemic heart disease, unspecified: Secondary | ICD-10-CM | POA: Diagnosis present

## 2017-03-01 DIAGNOSIS — I739 Peripheral vascular disease, unspecified: Secondary | ICD-10-CM | POA: Diagnosis present

## 2017-03-01 DIAGNOSIS — Z87891 Personal history of nicotine dependence: Secondary | ICD-10-CM | POA: Diagnosis not present

## 2017-03-01 DIAGNOSIS — I10 Essential (primary) hypertension: Secondary | ICD-10-CM | POA: Diagnosis present

## 2017-03-01 DIAGNOSIS — Z560 Unemployment, unspecified: Secondary | ICD-10-CM

## 2017-03-01 DIAGNOSIS — R440 Auditory hallucinations: Secondary | ICD-10-CM | POA: Diagnosis not present

## 2017-03-01 DIAGNOSIS — G2581 Restless legs syndrome: Secondary | ICD-10-CM | POA: Diagnosis present

## 2017-03-01 DIAGNOSIS — M549 Dorsalgia, unspecified: Secondary | ICD-10-CM | POA: Diagnosis present

## 2017-03-01 DIAGNOSIS — E10319 Type 1 diabetes mellitus with unspecified diabetic retinopathy without macular edema: Secondary | ICD-10-CM | POA: Diagnosis present

## 2017-03-01 DIAGNOSIS — Z7902 Long term (current) use of antithrombotics/antiplatelets: Secondary | ICD-10-CM | POA: Diagnosis not present

## 2017-03-01 DIAGNOSIS — Z794 Long term (current) use of insulin: Secondary | ICD-10-CM

## 2017-03-01 DIAGNOSIS — Y92230 Patient room in hospital as the place of occurrence of the external cause: Secondary | ICD-10-CM | POA: Diagnosis present

## 2017-03-01 DIAGNOSIS — F419 Anxiety disorder, unspecified: Secondary | ICD-10-CM | POA: Diagnosis present

## 2017-03-01 DIAGNOSIS — H4020X0 Unspecified primary angle-closure glaucoma, stage unspecified: Secondary | ICD-10-CM | POA: Diagnosis present

## 2017-03-01 DIAGNOSIS — E119 Type 2 diabetes mellitus without complications: Secondary | ICD-10-CM | POA: Diagnosis not present

## 2017-03-01 DIAGNOSIS — K219 Gastro-esophageal reflux disease without esophagitis: Secondary | ICD-10-CM | POA: Diagnosis present

## 2017-03-01 DIAGNOSIS — F39 Unspecified mood [affective] disorder: Secondary | ICD-10-CM | POA: Diagnosis not present

## 2017-03-01 DIAGNOSIS — F4323 Adjustment disorder with mixed anxiety and depressed mood: Secondary | ICD-10-CM | POA: Diagnosis present

## 2017-03-01 LAB — GLUCOSE, CAPILLARY
GLUCOSE-CAPILLARY: 366 mg/dL — AB (ref 65–99)
GLUCOSE-CAPILLARY: 227 mg/dL — AB (ref 65–99)
GLUCOSE-CAPILLARY: 107 mg/dL — AB (ref 65–99)
Glucose-Capillary: 145 mg/dL — ABNORMAL HIGH (ref 65–99)
Glucose-Capillary: 173 mg/dL — ABNORMAL HIGH (ref 65–99)
Glucose-Capillary: 193 mg/dL — ABNORMAL HIGH (ref 65–99)
Glucose-Capillary: 254 mg/dL — ABNORMAL HIGH (ref 65–99)
Glucose-Capillary: 352 mg/dL — ABNORMAL HIGH (ref 65–99)

## 2017-03-01 MED ORDER — INSULIN ASPART 100 UNIT/ML ~~LOC~~ SOLN: 0.0000 [IU] | SUBCUTANEOUS | Status: DC

## 2017-03-01 MED ORDER — INSULIN ASPART 100 UNIT/ML ~~LOC~~ SOLN
0.0000 [IU] | SUBCUTANEOUS | Status: DC | PRN
  Administered 2017-03-01: 2 [IU] via SUBCUTANEOUS
  Filled 2017-03-01: qty 0.15

## 2017-03-01 MED ORDER — METHOCARBAMOL 500 MG PO TABS
500.0000 mg | ORAL_TABLET | Freq: Four times a day (QID) | ORAL | Status: DC | PRN
  Administered 2017-03-01 – 2017-03-05 (×4): 500 mg via ORAL
  Filled 2017-03-01 (×5): qty 1

## 2017-03-01 MED ORDER — INSULIN ASPART 100 UNIT/ML ~~LOC~~ SOLN
0.0000 [IU] | SUBCUTANEOUS | Status: DC
  Administered 2017-03-01: 3 [IU] via SUBCUTANEOUS
  Administered 2017-03-02: 1 [IU] via SUBCUTANEOUS
  Administered 2017-03-02: 5 [IU] via SUBCUTANEOUS
  Administered 2017-03-02: 3 [IU] via SUBCUTANEOUS
  Administered 2017-03-02: 2 [IU] via SUBCUTANEOUS
  Administered 2017-03-02: 1 [IU] via SUBCUTANEOUS
  Administered 2017-03-02: 3 [IU] via SUBCUTANEOUS
  Administered 2017-03-03: 2 [IU] via SUBCUTANEOUS
  Administered 2017-03-03: 3 [IU] via SUBCUTANEOUS
  Administered 2017-03-03: 4 [IU] via SUBCUTANEOUS
  Administered 2017-03-04: 1 [IU] via SUBCUTANEOUS
  Administered 2017-03-04: 3 [IU] via SUBCUTANEOUS
  Administered 2017-03-04 – 2017-03-05 (×3): 2 [IU] via SUBCUTANEOUS
  Administered 2017-03-05: 4 [IU] via SUBCUTANEOUS
  Administered 2017-03-05: 1 [IU] via SUBCUTANEOUS
  Administered 2017-03-05: 3 [IU] via SUBCUTANEOUS
  Administered 2017-03-05 (×2): 2 [IU] via SUBCUTANEOUS
  Administered 2017-03-06: 4 [IU] via SUBCUTANEOUS
  Administered 2017-03-06: 3 [IU] via SUBCUTANEOUS
  Administered 2017-03-07: 2 [IU] via SUBCUTANEOUS
  Administered 2017-03-07: 1 [IU] via SUBCUTANEOUS

## 2017-03-01 MED ORDER — ATORVASTATIN CALCIUM 20 MG PO TABS
20.0000 mg | ORAL_TABLET | Freq: Every evening | ORAL | Status: DC
  Administered 2017-03-01 – 2017-03-06 (×6): 20 mg via ORAL
  Filled 2017-03-01 (×8): qty 1

## 2017-03-01 MED ORDER — LISINOPRIL 5 MG PO TABS
5.0000 mg | ORAL_TABLET | Freq: Every evening | ORAL | Status: DC
  Administered 2017-03-01 – 2017-03-06 (×6): 5 mg via ORAL
  Filled 2017-03-01 (×8): qty 1

## 2017-03-01 MED ORDER — INSULIN ASPART 100 UNIT/ML ~~LOC~~ SOLN: 0.0000 [IU] | Freq: Every day | SUBCUTANEOUS | Status: DC

## 2017-03-01 MED ORDER — ALUM & MAG HYDROXIDE-SIMETH 200-200-20 MG/5ML PO SUSP
30.0000 mL | ORAL | Status: DC | PRN
Start: 2017-03-01 — End: 2017-03-07

## 2017-03-01 MED ORDER — HYDROXYZINE HCL 25 MG PO TABS
25.0000 mg | ORAL_TABLET | Freq: Four times a day (QID) | ORAL | Status: DC | PRN
  Administered 2017-03-03 – 2017-03-05 (×3): 25 mg via ORAL
  Filled 2017-03-01: qty 10
  Filled 2017-03-01 (×4): qty 1

## 2017-03-01 MED ORDER — ACETAMINOPHEN 325 MG PO TABS
650.0000 mg | ORAL_TABLET | Freq: Four times a day (QID) | ORAL | Status: DC | PRN
  Administered 2017-03-06: 650 mg via ORAL
  Filled 2017-03-01: qty 2

## 2017-03-01 MED ORDER — ADULT MULTIVITAMIN W/MINERALS CH
1.0000 | ORAL_TABLET | Freq: Every day | ORAL | Status: DC
  Administered 2017-03-01 – 2017-03-07 (×7): 1 via ORAL
  Filled 2017-03-01 (×9): qty 1

## 2017-03-01 MED ORDER — TIMOLOL MALEATE 0.5 % OP SOLN
1.0000 [drp] | Freq: Two times a day (BID) | OPHTHALMIC | Status: DC
  Administered 2017-03-01 – 2017-03-07 (×13): 1 [drp] via OPHTHALMIC
  Filled 2017-03-01: qty 5

## 2017-03-01 MED ORDER — GABAPENTIN 300 MG PO CAPS
300.0000 mg | ORAL_CAPSULE | Freq: Every day | ORAL | Status: DC
  Administered 2017-03-01 – 2017-03-06 (×6): 300 mg via ORAL
  Filled 2017-03-01 (×9): qty 1

## 2017-03-01 MED ORDER — MAGNESIUM HYDROXIDE 400 MG/5ML PO SUSP: 30.0000 mL | Freq: Every day | ORAL | Status: DC | PRN

## 2017-03-01 MED ORDER — LURASIDONE HCL 40 MG PO TABS
40.0000 mg | ORAL_TABLET | Freq: Every day | ORAL | Status: DC
  Administered 2017-03-01 – 2017-03-04 (×4): 40 mg via ORAL
  Filled 2017-03-01 (×6): qty 1

## 2017-03-01 MED ORDER — PANTOPRAZOLE SODIUM 40 MG PO TBEC
40.0000 mg | DELAYED_RELEASE_TABLET | Freq: Every day | ORAL | Status: DC
  Administered 2017-03-01 – 2017-03-07 (×7): 40 mg via ORAL
  Filled 2017-03-01 (×9): qty 1

## 2017-03-01 MED ORDER — TRAZODONE HCL 50 MG PO TABS
50.0000 mg | ORAL_TABLET | Freq: Every evening | ORAL | Status: DC | PRN
  Administered 2017-03-02: 50 mg via ORAL
  Filled 2017-03-01: qty 1

## 2017-03-01 MED ORDER — INSULIN ASPART 100 UNIT/ML ~~LOC~~ SOLN
0.0000 [IU] | Freq: Three times a day (TID) | SUBCUTANEOUS | Status: DC
  Administered 2017-03-01: 5 [IU] via SUBCUTANEOUS
  Administered 2017-03-02: 1 [IU] via SUBCUTANEOUS
  Administered 2017-03-02: 10 [IU] via SUBCUTANEOUS
  Administered 2017-03-02: 1 [IU] via SUBCUTANEOUS
  Administered 2017-03-03 – 2017-03-04 (×3): 3 [IU] via SUBCUTANEOUS
  Administered 2017-03-04: 2 [IU] via SUBCUTANEOUS
  Administered 2017-03-04: 6 [IU] via SUBCUTANEOUS
  Administered 2017-03-05: 2 [IU] via SUBCUTANEOUS
  Administered 2017-03-05: 3 [IU] via SUBCUTANEOUS
  Administered 2017-03-05: 4 [IU] via SUBCUTANEOUS
  Administered 2017-03-06: 3 [IU] via SUBCUTANEOUS
  Administered 2017-03-06 (×2): 4 [IU] via SUBCUTANEOUS
  Administered 2017-03-07: 3 [IU] via SUBCUTANEOUS

## 2017-03-01 MED ORDER — CLONAZEPAM 0.5 MG PO TABS
0.5000 mg | ORAL_TABLET | Freq: Two times a day (BID) | ORAL | Status: DC
  Administered 2017-03-01 – 2017-03-07 (×13): 0.5 mg via ORAL
  Filled 2017-03-01 (×13): qty 1

## 2017-03-01 MED ORDER — INSULIN GLARGINE 100 UNIT/ML ~~LOC~~ SOLN
28.0000 [IU] | Freq: Every day | SUBCUTANEOUS | Status: DC
  Administered 2017-03-01 – 2017-03-07 (×7): 28 [IU] via SUBCUTANEOUS

## 2017-03-01 MED ORDER — IBUPROFEN 600 MG PO TABS
600.0000 mg | ORAL_TABLET | Freq: Once | ORAL | Status: AC
  Administered 2017-03-01: 600 mg via ORAL
  Filled 2017-03-01 (×2): qty 1

## 2017-03-01 MED ORDER — TRAZODONE HCL 50 MG PO TABS
50.0000 mg | ORAL_TABLET | Freq: Every evening | ORAL | Status: DC | PRN
  Filled 2017-03-01 (×2): qty 1

## 2017-03-01 MED ORDER — CLOPIDOGREL BISULFATE 75 MG PO TABS
75.0000 mg | ORAL_TABLET | Freq: Every evening | ORAL | Status: DC
  Administered 2017-03-01 – 2017-03-06 (×6): 75 mg via ORAL
  Filled 2017-03-01 (×8): qty 1

## 2017-03-01 MED ORDER — INSULIN ASPART 100 UNIT/ML ~~LOC~~ SOLN
0.0000 [IU] | Freq: Three times a day (TID) | SUBCUTANEOUS | Status: DC
  Administered 2017-03-01: 15 [IU] via SUBCUTANEOUS

## 2017-03-01 NOTE — BHH Counselor (Signed)
Adult Comprehensive Assessment  Patient ID: Edgar Huynh, male   DOB: 13-Jun-1956, 61 y.o.   MRN: [id number]  Information Source: Information source: Patient  Current Stressors:  Employment / Job issues: Will be laid off of job by end of year Family Relationships: All family is here in [place]-he and wife live in [place] / Lack of resources (include bankruptcy): Worried about no income and no insurance to supplement MCR fir wife since she is disabled and has huge Materials engineer / Lack of housing: N/A Physical health (include injuries & life threatening diseases): Diabetes, for which he recently stopped taking insulin because God told him to Substance abuse: N/A Bereavement / Loss: Father died earlier this year  Living/Environment/Situation:  Living Arrangements: Spouse/significant other Living conditions (as described by patient or guardian): "I have to take care of the house because she is basically confined to her room due to failed back surgeries." How long has patient lived in current situation?: 10 plus years What is atmosphere in current home: Chaotic, Supportive  Family History:  Marital status: Married Number of Years Married: 16 What types of issues is patient dealing with in the relationship?: See above Are you sexually active?: No What is your sexual orientation?: heterosexual Does patient have children?: Yes How many children?: 2 How is patient's relationship with their children?: son and daughter, plus 2 grandchildren  Childhood History:  By whom was/is the patient raised?: Both parents Description of patient's relationship with caregiver when they were a child: good Patient's description of current relationship with people who raised him/her: good with mother, father recently deceased Does patient have siblings?: Yes Number of Siblings: 2 Description of patient's current relationship with siblings: brothers, both died awhile ago Did patient suffer any  verbal/emotional/physical/sexual abuse as a child?: No Did patient suffer from severe childhood neglect?: No Has patient ever been sexually abused/assaulted/raped as an adolescent or adult?: No Was the patient ever a victim of a crime or a disaster?: No Witnessed domestic violence?: No Has patient been effected by domestic violence as an adult?: No  Education:  Highest grade of school patient has completed: 72 Currently a Ship broker?: No Learning disability?: No  Employment/Work Situation:   Employment situation: Employed Where is patient currently employed?: [employer] How long has patient been employed?: 33.5 years Patient's job has been impacted by current illness: Yes Describe how patient's job has been impacted: "How can I work when I am in the hospital?" What is the longest time patient has a held a job?: see above Where was the patient employed at that time?: see above Has patient ever been in the [employer]?: No Are There Guns or Other Weapons in [place]?: Yes Are These Weapons Safely Secured?: No Who Could Verify You Are Able To Have These Secured:: Pt gave permission to talk to wife  Financial Resources:   Financial resources: Income from employment Does patient have a representative payee or guardian?: No  Alcohol/Substance Abuse:   What has been your use of drugs/alcohol within the last 12 months?: Occasional social drink Alcohol/Substance Abuse Treatment Hx: Denies past history Has alcohol/substance abuse ever caused legal problems?: No  Social Support System:   Pensions consultant Support System: Fair Astronomer System: Wife, "sometimes" other family members Type of faith/religion: Darrick Meigs How does patient's faith help to cope with current illness?: I pray  Leisure/Recreation:   Leisure and Hobbies: Secretary/administrator  Strengths/Needs:   What things does the patient do well?: "Everything I try"  In what areas does patient struggle / problems  for patient: "Heat, humidity, finding time to get everything done at home, stress of impending loss of job and resulting loss of insurance, my dad's death and my mom who is left behind and lonely."  Discharge Plan:   Does patient have access to transportation?: Yes Will patient be returning to same living situation after discharge?: Yes Currently receiving community mental health services: No If no, would patient like referral for services when discharged?: Yes (What county?) ([place]) Does patient have financial barriers related to discharge medications?: No  Summary/Recommendations:   Summary and Recommendations (to be completed by the evaluator): "Edgar Huynh" is a 61 YO Caucasian male diagnosed with MDD, severe, with psychosis.  He states he is hearing God's voice, which at one point told him to stop taking insulin, and the fact that he is hearing this voice is very disconcerting to him. He has multiple stressors going on. Edgar Huynh can benefit from crises stabilization, medication management, therapeutic milieu and referral for services.  Trish Mage. 03/01/2017

## 2017-03-01 NOTE — Progress Notes (Signed)
D:Pt has been in the bed most of the morning. He reports that he has back pain and can not walk without a walker since sleeping in the hospital beds. Pt says that he takes his cbg every hour at home. Pt said that he heard God's voice and was told to quit taking insulin and recheck every hour. He said that his level stayed the same at 136 for several hours and then spiked causing him to be very upset and start cursing.   A:Pharmacy was notified concerning insulin orders and new orders were written. PT consult requested.  R:Pt denies si and hi. Safety maintained on the unit.

## 2017-03-01 NOTE — Plan of Care (Signed)
Problem: Education: Goal: Ability to state activities that reduce stress will improve Outcome: Not Progressing Pt has been in his room not attending groups this morning or working on coping skills.

## 2017-03-01 NOTE — Progress Notes (Signed)
Recreation Therapy Notes Date: 03/01/2017 Time: 10:00am Location: 500 Hall Dayroom   Group Topic: Decision Making  Goal Area(s) Addresses:  Pts will successfully identify how their choices affect other people in their lives. Pts will successfully identify how to overcome an obstacle they may face when someone makes a poor choice around them.  Intervention: Game  Activity: Pts will work in teams to create as many words they can with the letters they chose. Pts will pick letters independently to use with their team for the first round and for the secound round pts will pick letters as a team.  Education: Decision Making, Discharge Planning  Education Outcome: Acknowledges understanding  Clinical Observations/Feedback: Pt did not attend group.  Donovan Kail, Recreation Therapy Intern  Victorino Sparrow, LRT/CTRS

## 2017-03-01 NOTE — BHH Group Notes (Signed)
[facility] LCSW Group Therapy  03/01/2017 3:06 PM   Type of Therapy:  Group Therapy  Participation Level:  Active  Participation Quality:  Attentive  Affect:  Appropriate  Cognitive:  Appropriate  Insight:  Improving  Engagement in Therapy:  Engaged  Modes of Intervention:  Clarification, Education, Exploration and Socialization  Summary of Progress/Problems: Today's group focused on relapse prevention.  We defined the term, and then brainstormed on ways to prevent relapse. Invited.  Chose to not attend.  Roque Lias B 03/01/2017 , 3:06 PM

## 2017-03-01 NOTE — Tx Team (Signed)
Initial Treatment Plan 03/01/2017 3:08 AM Edgar Huynh QMG:[id number]    PATIENT STRESSORS: Financial difficulties Loss of father a year ago Occupational concerns   PATIENT STRENGTHS: Communication skills Religious Affiliation Supportive family/friends   PATIENT IDENTIFIED PROBLEMS: "i want someone to help me with hearing God"  "God tells me he's going to heal me"  Psychosis  Anxiety  Ineffective coping             DISCHARGE CRITERIA:  Safe-care adequate arrangements made Verbal commitment to aftercare and medication compliance  PRELIMINARY DISCHARGE PLAN: Return to previous living arrangement  PATIENT/FAMILY INVOLVEMENT: This treatment plan has been presented to and reviewed with the patient, Edgar Huynh, and/or family member.  The patient and family have been given the opportunity to ask questions and make suggestions.  Leia Alf, RN 03/01/2017, 3:08 AM

## 2017-03-01 NOTE — ED Notes (Signed)
Patient sent over to [facility] for further evaluation at this time. Transported by [facility] transport.

## 2017-03-01 NOTE — Progress Notes (Signed)
[place] for INsulin Indication: DM No Known Allergies   Spoke with Patient regarding insulin dosage.  Patient currently uses electronic device and enters cbg and carbs and machine provides insulin correction and carb counting doses of insulin which he then gives himself.  He was not able to provide correction values as this is entered into his device.    Called Endocrinologist Dr Adline Mango at [phone] regarding insulin dosing.  Spoke with Dr Alvira Monday who is very familiar with patient and her recommendation was CBG every 3 to 4 hours, with correction.   Correction Novolog should be (glucose - 120)/45 for cbg>165.    In addition to correction, Novolog with meals and snacks should be administered at a dose of 1 unit for each 6gm of carbohydrates.   Note:  Patient is able to count carbohydrates with some limitations on some food sources with large variations like bread products.    In addition to Novolog he reported lantus 28 units qam and Dr Alvira Monday verified this also.  Patient should receive cbg every 4 hours and correction should be given if needed.  Patient is aware of cbg times and understands he will be awakened during the night.    Lenox Ponds 03/01/2017,3:34 PM

## 2017-03-01 NOTE — Progress Notes (Signed)
Recreation Therapy Notes  INPATIENT RECREATION THERAPY ASSESSMENT  Patient Details Name: Edgar Huynh MRN: [id number] DOB: 09-01-1955 Today's Date: 03/01/2017  Pt reported he was admitted because he can "hear God talking to him."  Patient Stressors: Family, Death, Work, Health  Pt reported his mother is severely sick and his wife is severely handicapped.  Pt reported his father passed away.  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  Pt reported he is currently trying to manage his diabetes.  Coping Skills:   Talking, Music  Personal Challenges:    Leisure Interests (2+):  Social - Family, Music - Listen  Awareness of Community Resources:  Yes  Community Resources:  [employer], [employer], Tax inspector, Chief Executive Officer  Current Use: Yes  If no, Barriers?:    Patient Strengths:  Financial trader, prayer  Patient Identified Areas of Improvement:  "can'tthink of anything"  Current Recreation Participation:  variable, not often  Patient Goal for Hospitalization:  "to stop hearing voices from God"  [place] of Residence:  [place] of Residence:  [place]   Current [place] (including self-harm):  No  Current [place]:  No  Consent to Intern Participation: Yes  Donovan Kail, Recreation Therapy Intern  Victorino Sparrow, LRT/CTRS  Donovan Kail 03/01/2017, 11:53 AM

## 2017-03-01 NOTE — H&P (Signed)
Psychiatric Admission Assessment Adult  Patient Identification: Edgar Huynh  MRN:  [id number]  Date of Evaluation:  03/01/2017  Chief Complaint:  psychosis  Principal Diagnosis: Major depressive disorder, single episode, severe with psychotic features.  Diagnosis:   Patient Active Problem List   Diagnosis Date Noted  . MDD (major depressive disorder), single episode, severe with psychotic features ([place]) [F32.3] 03/01/2017  . Adjustment disorder with mixed anxiety and depressed mood [F43.23] 02/28/2017  . 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 Admitted to the [facility] from the [facility] with complaints of auditory hallucinations. Reports indicate that patient also cited other stressors within his family which include recent death of father, disable wife & job loss. He was brought to the hospital for mental health evaluation & possible treatment.  During this assessment, Edgar Huynh reports, "My daughter brought me to this hospital as a walk-in for evaluation last Saturday. I was then sent to the [facility] for evaluation. This is what happened, prior to Saturday, I got very upset while in the shower. I had done that twice within a short amount of time.  This happened in [place], where I reside with my wife. I was hearing a very distinctive voice. I knew that it was God's voice. He (God) told me that he is going to cure my diabetes. He told me to check my blood sugar every hour & not take my insulin any more. This I did by following his exact instructions. Then, each time I checked my blood sugar that morning, it was normal. Then, later in the afternoon, my blood went up. I got upset & agitated. I used few profanities. My wife got very worried & contacted my daughter who lives here in [place], [place].  I have heard God talk to me for about a year prior to all this. I'm not really depressed, just sad that I'm in this hospital. I have a lot of stress going on in my life right now".  Objective: Edgar Huynh presents with flat. He is verbally responsive. He is aware of situation. He is also psychotic. He says he has been hearing the voice of God x 1 year. He says God was at a time telling him how to manage his diabetes with a promise that he (God will cure his diabetes). Even at this particular moment, ehtan delfavero that what he was hearing was the voice of God. He denies being depressed, VH, SIHI. He is overly concerned that his blood sugar will not be managed well in this hospital as he has a very special method that he used at home to keep his blood sugar within norm. Turki is encouraged to stay focused in getting better as we continue to with diabetes management protocols to keep his blood sugar in check. He is refusing to eat breakfast until he knows what his blood sugar reading is. He uses walker to aid his ambulation. Presents with lower extremity weakness. He is complaining of low back which he says was as result of sleeping in an uncomfortable bed at [facility]. Requested something stronger than Ibuprofen.  Associated Signs/Symptoms:  Depression Symptoms:  "I'm not really depressed, just sad because in here".  (Hypo) Manic Symptoms:  Hallucinations, Labiality of Mood,  Anxiety Symptoms:  Excessive Worry,  Psychotic Symptoms:  Delusions, Hallucinations: Auditory  PTSD Symptoms: Denies any PTSD symptoms & or event.  Total Time spent with patient: 1 hour  Past Psychiatric History: Denies any psychiatric history.  Is the patient at risk to self? No.  Has the patient been a risk to self in the past 6 months? No.  Has the patient been a risk to self within the distant past? No.  Is the patient a risk to others? No.  Has the patient been a risk to others in the past 6 months? No.  Has  the patient been a risk to others within the distant past? No.   Prior Inpatient Therapy: No  Prior Outpatient Therapy: No.  Alcohol Screening: 1. How often do you have a drink containing alcohol?: Never 2. How many drinks containing alcohol do you have on a typical day when you are drinking?: 1 or 2 3. How often do you have six or more drinks on one occasion?: Never Preliminary Score: 0 9. Have you or someone else been injured as a result of your drinking?: No 10. Has a relative or friend or a doctor or another health worker been concerned about your drinking or suggested you cut down?: No Alcohol Use Disorder Identification Test Final Score (AUDIT): 0 Brief Intervention: AUDIT score less than 7 or less-screening does not suggest unhealthy drinking-brief intervention not indicated  Substance Abuse History in the last 12 months:  No.  Consequences of Substance Abuse: NA  Previous Psychotropic Medications: No   Psychological Evaluations: No   Past Medical History:  Past Medical History:  Diagnosis Date  . Hypercholesteremia   . Hypertension   . Type 1 diabetes [name])     Past Surgical History:  Procedure Laterality Date  . CAROTID ARTERY ANGIOPLASTY     Family History: History reviewed. No pertinent family history.  Family Psychiatric  History:   Tobacco Screening: Have you used any form of tobacco in the last 30 days? (Cigarettes, Smokeless Tobacco, Cigars, and/or Pipes): No  Social History:  History  Alcohol Use  . Yes    Comment: occasional     History  Drug Use No    Additional Social History: Pain Medications: See MAR Prescriptions: See MAR Over the Counter: See MAR History of alcohol / drug use?: No history of alcohol / drug abuse Negative Consequences of Use: Work / School  Allergies:  No Known Allergies  Lab Results:  Results for orders placed or performed during the hospital encounter of 03/01/17 (from the past 48 hour(s))  Glucose, capillary      Status: Abnormal   Collection Time: 03/01/17  2:07 AM  Result Value Ref Range   Glucose-Capillary 366 (H) 65 - 99 mg/dL  Glucose, capillary     Status: Abnormal   Collection Time: 03/01/17  6:26 AM  Result Value Ref Range   Glucose-Capillary 352 (H) 65 - 99 mg/dL  Glucose, capillary     Status: Abnormal   Collection Time: 03/01/17  9:55 AM  Result Value Ref Range   Glucose-Capillary 227 (H) 65 - 99 mg/dL   Comment 1 Notify RN    Blood Alcohol level:  Lab Results  Component Value Date   ETH <5 23/76/2831   Metabolic Disorder Labs:  No results found for: HGBA1C, MPG No results found for: PROLACTIN No results found for: CHOL, TRIG, HDL, CHOLHDL, VLDL, LDLCALC  Current Medications: Current Facility-Administered Medications  Medication Dose Route Frequency Provider Last Rate Last Dose  . acetaminophen (TYLENOL) tablet 650 mg  650 mg Oral  Q6H PRN Laverle Hobby, PA-C      . alum & mag hydroxide-simeth (MAALOX/MYLANTA) 200-200-20 MG/5ML suspension 30 mL  30 mL Oral Q4H PRN Laverle Hobby, PA-C      . atorvastatin (LIPITOR) tablet 20 mg  20 mg Oral QPM Simon, Spencer E, PA-C      . clonazePAM Bobbye [place]) tablet 0.5 mg  0.5 mg Oral BID Patriciaann Clan E, PA-C   0.5 mg at 03/01/17 0853  . clopidogrel (PLAVIX) tablet 75 mg  75 mg Oral QPM Simon, Spencer E, PA-C      . gabapentin (NEURONTIN) capsule 300 mg  300 mg Oral QHS Simon, Spencer E, PA-C      . hydrOXYzine (ATARAX/VISTARIL) tablet 25 mg  25 mg Oral Q6H PRN Patriciaann Clan E, PA-C      . insulin aspart (novoLOG) injection 0-15 Units  0-15 Units Subcutaneous TID WC Patriciaann Clan E, PA-C   15 Units at 03/01/17 0744  . insulin aspart (novoLOG) injection 0-5 Units  0-5 Units Subcutaneous QHS Simon, Spencer E, PA-C      . lisinopril (PRINIVIL,ZESTRIL) tablet 5 mg  5 mg Oral QPM Simon, Spencer E, PA-C      . lurasidone (LATUDA) tablet 40 mg  40 mg Oral Q breakfast Laverle Hobby, PA-C   40 mg at 03/01/17 0854  . magnesium hydroxide  (MILK OF MAGNESIA) suspension 30 mL  30 mL Oral Daily PRN Laverle Hobby, PA-C      . multivitamin with minerals tablet 1 tablet  1 tablet Oral Daily Laverle Hobby, PA-C   1 tablet at 03/01/17 0853  . pantoprazole (PROTONIX) EC tablet 40 mg  40 mg Oral Daily Laverle Hobby, PA-C   40 mg at 03/01/17 0853  . timolol (TIMOPTIC) 0.5 % ophthalmic solution 1 drop  1 drop Both Eyes BID Laverle Hobby, PA-C      . traZODone (DESYREL) tablet 50 mg  50 mg Oral QHS,MR X 1 Simon, Spencer E, PA-C       PTA Medications: Prescriptions Prior to Admission  Medication Sig Dispense Refill Last Dose  . ALPHA LIPOIC ACID PO Take 1 capsule by mouth daily.   02/27/2017 at Unknown time  . atorvastatin (LIPITOR) 20 MG tablet Take 20 mg by mouth every evening.    02/26/2017 at Unknown time  . clonazePAM (KLONOPIN) 0.5 MG tablet Take 0.5 mg by mouth 2 (two) times daily.   02/27/2017 at Unknown time  . clopidogrel (PLAVIX) 75 MG tablet Take 75 mg by mouth every evening.   02/26/2017 at 1800  . co-enzyme Q-10 30 MG capsule Take 30 mg by mouth daily.   02/27/2017 at Unknown time  . gabapentin (NEURONTIN) 300 MG capsule Take 300 mg by mouth at bedtime.   02/26/2017 at Unknown time  . hydrocortisone cream 1 % Apply 1 application topically 4 (four) times daily. Pt applies to face.   02/27/2017 at Unknown time  . insulin lispro (HUMALOG) 100 UNIT/ML injection Inject into the skin See admin instructions. Pt states that he bases how much he uses off of an algorithm.   02/27/2017 at Unknown time  . lisinopril (PRINIVIL,ZESTRIL) 10 MG tablet Take 5 mg by mouth every evening.    02/26/2017 at Unknown time  . Multiple Vitamin (MULTIVITAMIN WITH MINERALS) TABS tablet Take 1 tablet by mouth daily.   02/27/2017 at Unknown time  . omeprazole (PRILOSEC) 40 MG capsule Take 40 mg by mouth daily.   02/27/2017 at  Unknown time  . timolol (TIMOPTIC) 0.5 % ophthalmic solution Place 1 drop into both eyes 2 (two) times daily.   02/27/2017 at Unknown  time   Musculoskeletal: Strength & Muscle Tone: within normal limits Gait & Station: normal Patient leans: N/A  Psychiatric Specialty Exam: Physical Exam  Constitutional: He is oriented to person, place, and time. He appears well-developed.  HENT:  Head: Normocephalic.  Eyes: Pupils are equal, round, and reactive to light.  Neck: Normal range of motion.  Cardiovascular: Normal rate.   Hx. HTN, Hypercholesterolemia, DM  Respiratory: Effort normal.  GI: Soft.  Genitourinary:  Genitourinary Comments: Deferred  Musculoskeletal: Normal range of motion.  Uses walker to aid ambulation due to lower extremity weakness. Physical therapy consult ordered.  Neurological: He is alert and oriented to person, place, and time.  Skin: Skin is warm.    Review of Systems  Constitutional: Positive for malaise/fatigue.  HENT: Negative.   Eyes: Negative.   Respiratory: Negative.   Cardiovascular: Negative.        Hx. HTN, Hypercholesterolemia, DM  Gastrointestinal: Negative.   Genitourinary: Negative.   Musculoskeletal: Positive for back pain and myalgias.       Uses walker to aid ambulation due to lower extremity weakness. Physical therapy consult ordered.      Skin: Negative.   Neurological: Negative.   Endo/Heme/Allergies: Negative.   Psychiatric/Behavioral: Positive for depression and hallucinations (Auditory hallucinations). Negative for memory loss, substance abuse and suicidal ideas. The patient is nervous/anxious and has insomnia.     Blood pressure 118/79, pulse 90, temperature 98.2 F (36.8 C), temperature source Oral, resp. rate 19, height 5\' 8"  (1.727 m), weight 71.7 kg (158 lb), SpO2 98 %.Body mass index is 24.02 kg/m.  General Appearance: Disheveled, quiet  Eye Contact:  Good  Speech:  Clear and Coherent and Normal Rate, soft spoken.  Volume:  Decreased  Mood:  I'm not really depressed or feeling depressed, just sad because I'm here".  Affect:  Flat  Thought Process:   Coherent and Goal Directed  Orientation:  Full (Time, Place, and Person)  Thought Content:  Hallucinations: Auditory  Suicidal Thoughts:  Denies any thoughts, plans or intent.  Homicidal Thoughts:  Denies any thoughts, plans or intent.  Memory:  Immediate;   Good Recent;   Good Remote;   Fair  Judgement:  Fair  Insight:  Fair  Psychomotor Activity:  Decreased  Concentration:  Concentration: Good and Attention Span: Good  Recall:  [place] of Knowledge:  Good  Language:  Good  Akathisia:  Negative  Handed:  Right  AIMS (if indicated):     Assets:  Communication Skills Desire for Improvement Social Support  ADL's:  Intact  Cognition:  WNL  Sleep:  Number of Hours: 2.25   Treatment Plan/Recommendations: 1. Admit for crisis management and stabilization, estimated length of stay 3-5 days.  2. Medication management to reduce current symptoms to base line and improve the patient's overall level of functioning: See MAR.  3. Treat health problems as indicated.  4. Develop treatment plan to decrease risk of & the need for readmission.  5. Psycho-social education regarding self care.  6. Health care follow up as needed for medical problems.  7. Review, reconcile, and reinstate any pertinent home medications for other health issues where appropriate. 8. Call for consults with hospitalist for any additional specialty patient care services as needed.  Observation Level/Precautions:  15 minute checks  Laboratory:  Per ED, will obtain TSH, HGBa1c, Lipid  panel, EKG & prolactin level.  Psychotherapy: group sessions   Medications: See MAR   Consultations: As needed    Discharge Concerns: Safety, mood stability   Estimated LOS: 5-7 days  Other: Admit to the [facility].    Physician Treatment Plan for Primary Diagnosis: Will initiate medication management for mood stability. Set up an outpatient psychiatric services for medication management. Will encourage medication adherence with  psychiatric medications.  Long Term Goal(s): Improvement in symptoms so as ready for discharge  Short Term Goals: Ability to identify changes in lifestyle to reduce recurrence of condition will improve and Ability to verbalize feelings will improve  Physician Treatment Plan for Secondary Diagnosis: Active Problems:   MDD (major depressive disorder), single episode, severe with psychotic features ([place])  Long Term Goal(s): Improvement in symptoms so as ready for discharge  Short Term Goals: Ability to identify and develop effective coping behaviors will improve, Compliance with prescribed medications will improve and Ability to identify triggers associated with substance abuse/mental health issues will improve  I certify that inpatient services furnished can reasonably be expected to improve the patient's condition.    Encarnacion Slates, NP, PMHNP, FNP-BC 7/17/201810:28 AM

## 2017-03-01 NOTE — Progress Notes (Signed)
Adult Psychoeducational Group Note  Date:  03/01/2017 Time:  9:14 PM  Group Topic/Focus:  Wrap-Up Group:   The focus of this group is to help patients review their daily goal of treatment and discuss progress on daily workbooks.  Participation Level:  Did Not Attend  Additional Comments:  Pt did not attend wrap-up group.   Milus Glazier 03/01/2017, 9:14 PM

## 2017-03-01 NOTE — [facility] Suicide Risk Assessment (Addendum)
[facility] Admission Suicide Risk Assessment   Nursing information obtained from:    Demographic factors:    Current Mental Status:    Loss Factors:    Historical Factors:    Risk Reduction Factors:     Total Time spent with patient: 30 minutes Principal Problem: MDD (major depressive disorder), single episode, severe with psychotic features ([place]) Diagnosis:   Patient Active Problem List   Diagnosis Date Noted  . MDD (major depressive disorder), single episode, severe with psychotic features ([place]) [F32.3] 03/01/2017  . Adjustment disorder with mixed anxiety and depressed mood [F43.23] 02/28/2017  . 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  Continued Clinical Symptoms:  Alcohol Use Disorder Identification Test Final Score (AUDIT): 0 The "Alcohol Use Disorders Identification Test", Guidelines for Use in Primary Care, Second Edition.  World Pharmacologist [facility]). Score between 0-7:  no or low risk or alcohol related problems. Score between 8-15:  moderate risk of alcohol related problems. Score between 16-19:  high risk of alcohol related problems. Score 20 or above:  warrants further diagnostic evaluation for alcohol dependence and treatment.   CLINICAL FACTORS:   Severe Anxiety and/or Agitation Depression:   Delusional Hopelessness Impulsivity Insomnia Currently Psychotic Medical Diagnoses and Treatments/Surgeries   Musculoskeletal: Strength & Muscle Tone: Did not assess as patient is complaining of back pain, difficulty in walking Gait & Station: unsteady Patient leans: N/A  Psychiatric Specialty Exam: Physical Exam  Review of Systems  Constitutional: Negative.  Negative for fever and malaise/fatigue.  HENT: Negative.  Negative for  congestion and sore throat.   Eyes: Negative.  Negative for blurred vision, double vision, discharge and redness.  Respiratory: Negative.  Negative for cough, shortness of breath and wheezing.   Cardiovascular: Negative.  Negative for chest pain and palpitations.  Gastrointestinal: Negative.  Negative for abdominal pain, constipation, diarrhea, heartburn, nausea and vomiting.  Genitourinary: Negative.  Negative for dysuria.  Musculoskeletal: Positive for back pain, falls and myalgias.  Neurological: Negative.  Negative for dizziness, seizures, loss of consciousness and weakness.  Endo/Heme/Allergies: Negative.  Negative for environmental allergies.  Psychiatric/Behavioral: Positive for depression and hallucinations. Negative for memory loss, substance abuse and suicidal ideas. The patient is nervous/anxious and has insomnia.     Blood pressure 118/79, pulse 90, temperature 98.2 F (36.8 C), temperature source Oral, resp. rate 19, height 5\' 8"  (1.727 m), weight 71.7 kg (158 lb), SpO2 98 %.Body mass index is 24.02 kg/m.  General Appearance: Casual  Eye Contact:  Fair  Speech:  Clear and Coherent and Normal Rate  Volume:  Normal  Mood:  Depressed, Dysphoric, Hopeless and Irritable  Affect:  Congruent and Depressed  Thought Process:  Coherent, Goal Directed and Descriptions of Associations: Intact  Orientation:  Full (Time, Place, and Person)  Thought Content:  Hallucinations: Auditory and Rumination  Suicidal Thoughts:  No  Homicidal Thoughts:  No  Memory:  Immediate;   Fair Recent;   Fair Remote;   Fair  Judgement:  Impaired  Insight:  Lacking  Psychomotor Activity:  Mannerisms  Concentration:  Concentration: Fair and Attention Span: Fair  Recall:  [employer] of Knowledge:  Fair  Language:  Fair  Akathisia:  No  Handed:  Right  AIMS (if indicated):     Assets:  Desire for Improvement Housing  Social Support Transportation  ADL's:  Impaired  Cognition:  Impaired,  Mild  Sleep:   Number of Hours: 2.25      COGNITIVE FEATURES THAT CONTRIBUTE TO RISK:  Loss of executive function and Thought constriction (tunnel vision)    SUICIDE RISK:   Mild:  Suicidal ideation of limited frequency, intensity, duration, and specificity.  There are no identifiable plans, no associated intent, mild dysphoria and related symptoms, good self-control (both objective and subjective assessment), few other risk factors, and identifiable protective factors, including available and accessible social support.  PLAN OF CARE: Patient is psychotic, belief that God is talking to them, has been taking his insulin on and off, stating that because trying to cure him, gets agitated, as per patient's outpatient endocrinologist he has been agitated for a few weeks now, feels that he can help direct traffic and safety is an issue for patient due to his lack of insight, him being depressed and psychotic.  Patient is diabetic management was changed based on the recommendations by his outpatient endocrinologist. Patient has multiple stressors, needs to work on his coping skills, will start an antidepressant and antipsychotic to help with patient's symptoms of depression and psychosis. We'll also closely monitor sugars as patient has a history of diabetic ketoacidosis and has peripheral neuropathy and has had a stroke in the past  Patient needs a PT consult, has an order to be able to use walker as he is unsteady.  I certify that inpatient services furnished can reasonably be expected to improve the patient's condition.   Hampton Abbot, MD 03/01/2017, 2:59 PM

## 2017-03-01 NOTE — Progress Notes (Signed)
Admission Note   Pt is a 61 y/o male admitted to the [facility] adult unit. Pt at the time of admission endorsed moderate anxiety with severe back pain; states, "my back is messed up for sleeping in that bed at the ED; I was fine until I woke up with this terrible back pain" Pt denied depression, SI, HI or AVH however; admitted to hearing God speaking to him on how to control his diabetes; "all I hear the God speaking to me." Support, encouragement, and safe environment provided.  15-minute safety checks initiated and continued. Pt was angry and irritable through the admission process. Pt searched and no contrabands found.

## 2017-03-02 DIAGNOSIS — F39 Unspecified mood [affective] disorder: Secondary | ICD-10-CM

## 2017-03-02 DIAGNOSIS — F4323 Adjustment disorder with mixed anxiety and depressed mood: Secondary | ICD-10-CM

## 2017-03-02 DIAGNOSIS — F323 Major depressive disorder, single episode, severe with psychotic features: Principal | ICD-10-CM

## 2017-03-02 DIAGNOSIS — Z87891 Personal history of nicotine dependence: Secondary | ICD-10-CM

## 2017-03-02 DIAGNOSIS — G4700 Insomnia, unspecified: Secondary | ICD-10-CM

## 2017-03-02 LAB — AMB EXT LDL-C: LDL-C, External: 119

## 2017-03-02 LAB — AMB EXT HGBA1C: Hemoglobin A1c, External: 6.9

## 2017-03-02 LAB — GLUCOSE, CAPILLARY
GLUCOSE-CAPILLARY: 217 mg/dL — AB (ref 65–99)
GLUCOSE-CAPILLARY: 268 mg/dL — AB (ref 65–99)
GLUCOSE-CAPILLARY: 209 mg/dL — AB (ref 65–99)
Glucose-Capillary: 181 mg/dL — ABNORMAL HIGH (ref 65–99)
Glucose-Capillary: 357 mg/dL — ABNORMAL HIGH (ref 65–99)
Glucose-Capillary: 288 mg/dL — ABNORMAL HIGH (ref 65–99)
Glucose-Capillary: 294 mg/dL — ABNORMAL HIGH (ref 65–99)

## 2017-03-02 LAB — LIPID PANEL
CHOL/HDL RATIO: 4.3 ratio
CHOLESTEROL: 206 mg/dL — AB (ref 0–200)
HDL: 48 mg/dL (ref >40)
LDL CALC: 119 mg/dL — AB (ref 0–99)
Triglycerides: 193 mg/dL — ABNORMAL HIGH (ref <150)
VLDL: 39 mg/dL (ref 0–40)

## 2017-03-02 LAB — TSH: TSH: 2.128 u[IU]/mL (ref 0.350–4.500)

## 2017-03-02 NOTE — Tx Team (Addendum)
Interdisciplinary Treatment and Diagnostic Plan Update  03/07/2017 Time of Session: 8:30 AM Edgar Huynh MRN: [id number]  Principal Diagnosis: MDD (major depressive disorder), single episode, severe with psychotic features ([place])  Secondary Diagnoses: Principal Problem:   MDD (major depressive disorder), single episode, severe with psychotic features ([place])   Current Medications:  Current Facility-Administered Medications  Medication Dose Route Frequency Provider Last Rate Last Dose  . acetaminophen (TYLENOL) tablet 650 mg  650 mg Oral Q6H PRN Laverle Hobby, PA-C   650 mg at 03/06/17 2144  . alum & mag hydroxide-simeth (MAALOX/MYLANTA) 200-200-20 MG/5ML suspension 30 mL  30 mL Oral Q4H PRN Patriciaann Clan E, PA-C      . atorvastatin (LIPITOR) tablet 20 mg  20 mg Oral QPM Simon, Spencer E, PA-C   20 mg at 03/06/17 1711  . clonazePAM (KLONOPIN) tablet 0.5 mg  0.5 mg Oral BID Laverle Hobby, PA-C   0.5 mg at 03/07/17 0748  . clopidogrel (PLAVIX) tablet 75 mg  75 mg Oral QPM Patriciaann Clan E, PA-C   75 mg at 03/06/17 1711  . gabapentin (NEURONTIN) capsule 300 mg  300 mg Oral QHS Patriciaann Clan E, PA-C   300 mg at 03/06/17 2143  . hydrOXYzine (ATARAX/VISTARIL) tablet 25 mg  25 mg Oral Q6H PRN Laverle Hobby, PA-C   25 mg at 03/05/17 2129  . insulin aspart (novoLOG) injection 0-15 Units  0-15 Units Subcutaneous Q4H Hampton Abbot, MD   1 Units at 03/07/17 [phone]  . insulin aspart (novoLOG) injection 0-15 Units  0-15 Units Subcutaneous TID WC Valda Lamb, Prentiss Bells, MD   3 Units at 03/07/17 0745  . insulin aspart (novoLOG) injection 0-15 Units  0-15 Units Subcutaneous PRN Hampton Abbot, MD   2 Units at 03/01/17 2200  . insulin glargine (LANTUS) injection 28 Units  28 Units Subcutaneous Q0600 Hampton Abbot, MD   28 Units at 03/07/17 0608  . lisinopril (PRINIVIL,ZESTRIL) tablet 5 mg  5 mg Oral QPM Patriciaann Clan E, PA-C   5 mg at 03/06/17 1712  . magnesium hydroxide (MILK OF MAGNESIA)  suspension 30 mL  30 mL Oral Daily PRN Laverle Hobby, PA-C      . methocarbamol (ROBAXIN) tablet 500 mg  500 mg Oral Q6H PRN Lindell Spar I, NP   500 mg at 03/05/17 2129  . multivitamin with minerals tablet 1 tablet  1 tablet Oral Daily Laverle Hobby, PA-C   1 tablet at 03/07/17 0749  . pantoprazole (PROTONIX) EC tablet 40 mg  40 mg Oral Daily Laverle Hobby, PA-C   40 mg at 03/07/17 0749  . QUEtiapine (SEROQUEL) tablet 50 mg  50 mg Oral QHS Lindell Spar I, NP   50 mg at 03/05/17 2058  . timolol (TIMOPTIC) 0.5 % ophthalmic solution 1 drop  1 drop Both Eyes BID Laverle Hobby, PA-C   1 drop at 03/07/17 1856    PTA Medications: Prescriptions Prior to Admission  Medication Sig Dispense Refill Last Dose  . insulin glargine (LANTUS) 100 UNIT/ML injection Inject 28 Units into the skin daily.     . insulin lispro (HUMALOG) 100 UNIT/ML injection Inject 0-15 Units into the skin 3 (three) times daily before meals. 1 unit for each 6 gm carbohydrates with meals and snacks     . ALPHA LIPOIC ACID PO Take 1 capsule by mouth daily.   02/27/2017 at Unknown time  . atorvastatin (LIPITOR) 20 MG tablet Take 20 mg by mouth every evening.  02/26/2017 at Unknown time  . clonazePAM (KLONOPIN) 0.5 MG tablet Take 0.5 mg by mouth 2 (two) times daily.   02/27/2017 at Unknown time  . clopidogrel (PLAVIX) 75 MG tablet Take 75 mg by mouth every evening.   02/26/2017 at 1800  . co-enzyme Q-10 30 MG capsule Take 30 mg by mouth daily.   02/27/2017 at Unknown time  . gabapentin (NEURONTIN) 300 MG capsule Take 300 mg by mouth at bedtime.   02/26/2017 at Unknown time  . hydrocortisone cream 1 % Apply 1 application topically 4 (four) times daily. Pt applies to face.   02/27/2017 at Unknown time  . insulin lispro (HUMALOG) 100 UNIT/ML injection Inject into the skin See admin instructions. Pt states that he bases how much he uses off of an algorithm.  (cbg-120)/45 per md   02/27/2017 at Unknown time  . lisinopril  (PRINIVIL,ZESTRIL) 10 MG tablet Take 5 mg by mouth every evening.    02/26/2017 at Unknown time  . Multiple Vitamin (MULTIVITAMIN WITH MINERALS) TABS tablet Take 1 tablet by mouth daily.   02/27/2017 at Unknown time  . omeprazole (PRILOSEC) 40 MG capsule Take 40 mg by mouth daily.   02/27/2017 at Unknown time  . timolol (TIMOPTIC) 0.5 % ophthalmic solution Place 1 drop into both eyes 2 (two) times daily.   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   Physician Treatment Plan for Primary Diagnosis: MDD (major depressive disorder), single episode, severe with psychotic features ([place]) Long Term Goal(s): Improvement in symptoms so as ready for discharge  Short Term Goals: Ability to identify changes in lifestyle to reduce recurrence of condition will improve Ability to verbalize feelings will improve Ability to identify and develop effective coping behaviors will improve Compliance with prescribed medications will improve Ability to identify triggers associated with substance abuse/mental health issues will improve  Medication Management: Evaluate patient's response, side effects, and tolerance of medication regimen.  Therapeutic Interventions: 1 to 1 sessions, Unit Group sessions and Medication administration.  Evaluation of Outcomes: Adequate for Discharge  Physician Treatment Plan for Secondary Diagnosis: Principal Problem:   MDD (major depressive disorder), single episode, severe with psychotic features ([place])   Long Term Goal(s): Improvement in symptoms so as ready for discharge  Short Term Goals: Ability to identify changes in lifestyle to reduce recurrence of condition  will improve Ability to verbalize feelings will improve Ability to identify and develop effective coping behaviors will improve Compliance with prescribed medications will improve Ability to identify triggers associated with substance abuse/mental health issues will improve  Medication Management: Evaluate patient's response, side effects, and tolerance of medication regimen.  Therapeutic Interventions: 1 to 1 sessions, Unit Group sessions and Medication administration.  Evaluation of Outcomes: Adequate for Discharge   RN Treatment Plan for Primary Diagnosis: MDD (major depressive disorder), single episode, severe with psychotic features ([place]) Long Term Goal(s): Knowledge of disease and therapeutic regimen to maintain health will improve  Short Term Goals: Ability to identify and develop effective coping behaviors will improve and Compliance with prescribed medications will improve  Medication Management: RN will administer medications as ordered by provider, will assess and evaluate patient's response and provide education to patient for prescribed medication. RN will report any adverse and/or side effects to prescribing provider.  Therapeutic Interventions: 1 on 1 counseling sessions, Psychoeducation, Medication administration, Evaluate responses to treatment, Monitor vital signs and CBGs as ordered, Perform/monitor CIWA, COWS, AIMS and Fall Risk screenings as ordered, Perform wound care treatments as ordered.  Evaluation of Outcomes: Adequate for Discharge   LCSW Treatment Plan for Primary Diagnosis: MDD (major depressive disorder), single episode, severe with psychotic features ([place]) Long Term Goal(s): Safe transition to appropriate next level of care at discharge, Engage patient in therapeutic group addressing interpersonal concerns.  Short Term Goals: Engage patient in aftercare planning with referrals and resources  Therapeutic Interventions: Assess for all discharge needs, 1 to  1 time with Social worker, Explore available resources and support systems, Assess for adequacy in community support network, Educate family and significant other(s) on suicide prevention, Complete Psychosocial Assessment, Interpersonal group therapy.  Evaluation of Outcomes: Met  Return home, patient declines all referrals, wife aware.  States she will follow up w mental health providers and attempts to get patient to agree to follow up.  MD aware   Progress in Treatment: Attending groups: Yes Participating in groups: Yes Taking medication as prescribed: Yes Toleration medication: Yes, no side effects reported at this time Family/Significant other contact made: Wife Patient understands diagnosis: Yes AEB Discussing patient identified problems/goals with staff: Yes Medical problems stabilized or resolved: Yes Denies suicidal/homicidal ideation: Yes Issues/concerns per patient self-inventory: None Other: N/A  New problem(s) identified: None identified at this time.   New Short Term/Long Term Goal(s): None identified at this time.   Discharge Plan or Barriers: patient refusing all aftercare including PCP, per wife, pt needs to follow up w diabetes care and will arrange these appointments in [place], no further concerns expressed  Reason for Continuation of Hospitalization: Mood instability Hallucinations  Medication stabilization   Estimated Length of Stay: 3-5 days; discharge 03/07/17  Attendees: Patient: 03/07/2017  11:37 AM  Physician: Hampton Abbot, MD 03/07/2017  11:37 AM  Nursing: Rise Paganini, RN 03/07/2017  11:37 AM  RN Care Manager: Lars Pinks, RN 03/07/2017  11:37 AM  Social Worker: Edwyna Shell LCSW 03/07/2017  11:37 AM  Recreational Therapist: Winfield Cunas 03/07/2017  11:37 AM  Other: Norberto Sorenson 03/07/2017  11:37 AM  Other:  03/07/2017  11:37 AM    Scribe for Treatment Team:  Edwyna Shell LCSW  03/07/2017 11:37 AM

## 2017-03-02 NOTE — Progress Notes (Signed)
[facility] Group Notes:  (Nursing/MHT/Case Management/Adjunct)  Date:  03/02/2017  Time:  8:53 PM  Type of Therapy:  Psychoeducational Skills  Participation Level:  Active  Participation Quality:  Appropriate  Affect:  Appropriate  Cognitive:  Disorganized  Insight:  Lacking  Engagement in Group:  Lacking  Modes of Intervention:  Education  Summary of Progress/Problems: Patient described his day as having been "totally awesome". The patient explained that he learned to day why God spoke to him and shared with him why he needed to pray for others. As for the theme of the day, his personal development will include learning more about God.   Archie Balboa S 03/02/2017, 8:53 PM

## 2017-03-02 NOTE — Progress Notes (Signed)
Recreation Therapy Notes   Date: 03/02/2017 Time: 10:00am Location: [address] Dayroom  Group Topic: Problem-Solving  Goal Area(s) Addresses:  Pts will successfully identify problems they are currently facing. Pts will successfully offer solutions to anonymous problems.  Behavioral Response: Engaged  Intervention:  Chief of Staff Paper  Activity: Pts were asked to write down a current problem they are trying to solve. Pts were then given other peoples papers and asked to offer solutions for someone else problem.  Education:Problem-Solving, Discharge Planning  Education Outcome: Acknowledges understanding  Clinical Observations/Feedback: Pt actively participated in activity by identifying his problem as trying to "get people out of hell and helping them reach salvation." Pt offered solutions to other people's problems. Pt identified that the suggestions he received were good solutions, but not for his specific problem.  Donovan Kail, Recreation Therapy Intern  Victorino Sparrow, LRT/CTRS

## 2017-03-02 NOTE — Progress Notes (Signed)
[facility] MD Progress Note  03/02/2017 2:38 PM [name]  MRN:  [id number]  Subjective: Edgar Huynh reports, "I got up this morning feeling like a butterfly out of a cocoon. I feel great. The group session this morning was fantastic. I cannot wait for another group sessions today. Please, do not let me miss any groups. Oh, I'm still hearing God's voice. He says he likes you, he also says, he likes your ear rings".   Principal Problem: MDD (major depressive disorder), single episode, severe with psychotic features ([place])  Diagnosis:   Patient Active Problem List   Diagnosis Date Noted  . MDD (major depressive disorder), single episode, severe with psychotic features ([place]) [F32.3] 03/01/2017  . Adjustment disorder with mixed anxiety and depressed mood [F43.23] 02/28/2017  . 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 Diabetic retinopathy ([place]) [E11.319] 10/03/2012  . GERD (gastroesophageal reflux disease) [K21.9] 04/04/2012  . Diabetic neuropathy ([place]) [E11.40] 04/04/2012  . DM (diabetes mellitus) type I, controlled, with peripheral vascular disorder ([place]) [E10.51] 02/04/2009  . 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  Past Medical History:  Past Medical History:  Diagnosis Date  . Hypercholesteremia   . Hypertension   . Type 1 diabetes [facility])     Past Surgical History:  Procedure Laterality Date  . CAROTID ARTERY ANGIOPLASTY     Family History: History reviewed. No pertinent family history.  Family Psychiatric  History: See H&P.  Social History:  History  Alcohol Use  . Yes    Comment: occasional     History  Drug Use No    Social History   Social History  . Marital status: Married    Spouse name: N/A  . Number of children: N/A  . Years of education: N/A   Social History Main Topics  . Smoking status: Former Research scientist (life sciences)  . Smokeless tobacco:  Never Used  . Alcohol use Yes     Comment: occasional  . Drug use: No  . Sexual activity: Not Asked   Other Topics Concern  . None   Social History Narrative  . None   Additional Social History:    Pain Medications: See MAR Prescriptions: See MAR Over the Counter: See MAR History of alcohol / drug use?: No history of alcohol / drug abuse Negative Consequences of Use: Work / School  Sleep: Good  Appetite:  Good  Current Medications: Current Facility-Administered Medications  Medication Dose Route Frequency Provider Last Rate Last Dose  . acetaminophen (TYLENOL) tablet 650 mg  650 mg Oral Q6H PRN Laverle Hobby, PA-C      . alum & mag hydroxide-simeth (MAALOX/MYLANTA) 200-200-20 MG/5ML suspension 30 mL  30 mL Oral Q4H PRN Patriciaann Clan E, PA-C      . atorvastatin (LIPITOR) tablet 20 mg  20 mg Oral QPM Patriciaann Clan E, PA-C   20 mg at 03/01/17 1823  . clonazePAM (KLONOPIN) tablet 0.5 mg  0.5 mg Oral BID Laverle Hobby, PA-C   0.5 mg at 03/02/17 0911  . clopidogrel (PLAVIX) tablet 75 mg  75 mg Oral QPM Laverle Hobby, PA-C   75 mg at 03/01/17 1824  . gabapentin (NEURONTIN) capsule 300 mg  300 mg Oral QHS Patriciaann Clan E, PA-C   300 mg at 03/01/17 2204  . hydrOXYzine (ATARAX/VISTARIL) tablet 25 mg  25 mg Oral Q6H PRN Laverle Hobby, PA-C      .  insulin aspart (novoLOG) injection 0-15 Units  0-15 Units Subcutaneous Q4H Hampton Abbot, MD   5 Units at 03/02/17 [phone]  . insulin aspart (novoLOG) injection 0-15 Units  0-15 Units Subcutaneous TID WC Valda Lamb, Prentiss Bells, MD   1 Units at 03/02/17 0700  . insulin aspart (novoLOG) injection 0-15 Units  0-15 Units Subcutaneous PRN Hampton Abbot, MD   2 Units at 03/01/17 2200  . insulin glargine (LANTUS) injection 28 Units  28 Units Subcutaneous Q0600 Hampton Abbot, MD   28 Units at 03/02/17 [phone]  . lisinopril (PRINIVIL,ZESTRIL) tablet 5 mg  5 mg Oral QPM Patriciaann Clan E, PA-C   5 mg at 03/01/17 1824  . lurasidone (LATUDA)  tablet 40 mg  40 mg Oral Q breakfast Laverle Hobby, PA-C   40 mg at 03/02/17 0086  . magnesium hydroxide (MILK OF MAGNESIA) suspension 30 mL  30 mL Oral Daily PRN Patriciaann Clan E, PA-C      . methocarbamol (ROBAXIN) tablet 500 mg  500 mg Oral Q6H PRN Lindell Spar I, NP   500 mg at 03/01/17 2205  . multivitamin with minerals tablet 1 tablet  1 tablet Oral Daily Laverle Hobby, PA-C   1 tablet at 03/02/17 0910  . pantoprazole (PROTONIX) EC tablet 40 mg  40 mg Oral Daily Laverle Hobby, PA-C   40 mg at 03/02/17 7619  . timolol (TIMOPTIC) 0.5 % ophthalmic solution 1 drop  1 drop Both Eyes BID Laverle Hobby, PA-C   1 drop at 03/02/17 5093  . traZODone (DESYREL) tablet 50 mg  50 mg Oral QHS PRN Lindell Spar I, NP       Lab Results:  Results for orders placed or performed during the hospital encounter of 03/01/17 (from the past 48 hour(s))  Glucose, capillary     Status: Abnormal   Collection Time: 03/01/17  2:07 AM  Result Value Ref Range   Glucose-Capillary 366 (H) 65 - 99 mg/dL  Glucose, capillary     Status: Abnormal   Collection Time: 03/01/17  6:26 AM  Result Value Ref Range   Glucose-Capillary 352 (H) 65 - 99 mg/dL  Glucose, capillary     Status: Abnormal   Collection Time: 03/01/17  9:55 AM  Result Value Ref Range   Glucose-Capillary 227 (H) 65 - 99 mg/dL   Comment 1 Notify RN   Glucose, capillary     Status: Abnormal   Collection Time: 03/01/17 12:27 PM  Result Value Ref Range   Glucose-Capillary 107 (H) 65 - 99 mg/dL   Comment 1 Notify RN   Glucose, capillary     Status: Abnormal   Collection Time: 03/01/17  4:01 PM  Result Value Ref Range   Glucose-Capillary 145 (H) 65 - 99 mg/dL   Comment 1 Notify RN    Comment 2 Document in Chart   Glucose, capillary     Status: Abnormal   Collection Time: 03/01/17  8:00 PM  Result Value Ref Range   Glucose-Capillary 254 (H) 65 - 99 mg/dL  Glucose, capillary     Status: Abnormal   Collection Time: 03/01/17 10:10 PM  Result  Value Ref Range   Glucose-Capillary 193 (H) 65 - 99 mg/dL  Glucose, capillary     Status: Abnormal   Collection Time: 03/02/17 12:00 AM  Result Value Ref Range   Glucose-Capillary 173 (H) 65 - 99 mg/dL  Glucose, capillary     Status: Abnormal   Collection Time: 03/02/17  4:00 AM  Result  Value Ref Range   Glucose-Capillary 217 (H) 65 - 99 mg/dL  Glucose, capillary     Status: Abnormal   Collection Time: 03/02/17  6:36 AM  Result Value Ref Range   Glucose-Capillary 181 (H) 65 - 99 mg/dL  Lipid panel     Status: Abnormal   Collection Time: 03/02/17  6:45 AM  Result Value Ref Range   Cholesterol 206 (H) 0 - 200 mg/dL   Triglycerides 193 (H) <150 mg/dL   HDL 48 >40 mg/dL   Total CHOL/HDL Ratio 4.3 RATIO   VLDL 39 0 - 40 mg/dL   LDL Cholesterol 119 (H) 0 - 99 mg/dL    Comment:        Total Cholesterol/HDL:CHD Risk Coronary Heart Disease Risk Table                     Men   Women  1/2 Average Risk   3.4   3.3  Average Risk       5.0   4.4  2 X Average Risk   9.6   7.1  3 X Average Risk  23.4   11.0        Use the calculated Patient Ratio above and the CHD Risk Table to determine the patient's CHD Risk.        ATP III CLASSIFICATION (LDL):  <100     mg/dL   Optimal  100-129  mg/dL   Near or Above                    Optimal  130-159  mg/dL   Borderline  160-189  mg/dL   High  >190     mg/dL   Very High Performed at [facility] [address], [place], [place] [address]   TSH     Status: None   Collection Time: 03/02/17  6:45 AM  Result Value Ref Range   TSH 2.128 0.350 - 4.500 uIU/mL    Comment: Performed by a 3rd Generation assay with a functional sensitivity of <=0.01 uIU/mL. Performed at [facility] [address], [place], [place] [address]   Glucose, capillary     Status: Abnormal   Collection Time: 03/02/17  8:27 AM  Result Value Ref Range   Glucose-Capillary 357 (H) 65 - 99 mg/dL  Glucose, capillary     Status: Abnormal   Collection  Time: 03/02/17 11:59 AM  Result Value Ref Range   Glucose-Capillary 288 (H) 65 - 99 mg/dL   Comment 1 Notify RN    Comment 2 Document in Chart    Blood Alcohol level:  Lab Results  Component Value Date   ETH <5 78/24/2353   Metabolic Disorder Labs: No results found for: HGBA1C, MPG No results found for: PROLACTIN Lab Results  Component Value Date   CHOL 206 (H) 03/02/2017   TRIG 193 (H) 03/02/2017   HDL 48 03/02/2017   CHOLHDL 4.3 03/02/2017   VLDL 39 03/02/2017   LDLCALC 119 (H) 03/02/2017   Physical Findings: AIMS: Facial and Oral Movements Muscles of Facial Expression: None, normal Lips and Perioral Area: Minimal Jaw: None, normal Tongue: None, normal,Extremity Movements Upper (arms, wrists, hands, fingers): None, normal Lower (legs, knees, ankles, toes): None, normal, Trunk Movements Neck, shoulders, hips: None, normal, Overall Severity Severity of abnormal movements (highest score from questions above): None, normal Incapacitation due to abnormal movements: None, normal Patient's awareness of abnormal movements (rate only patient's report): No Awareness,  Dental Status Current problems with teeth and/or dentures?: No Does patient usually wear dentures?: No  CIWA:    COWS:     Musculoskeletal: Strength & Muscle Tone: within normal limits Gait & Station: unsteady, Walks with walter to aid his ambulation & balance. Patient leans: N/A  Psychiatric Specialty Exam: Physical Exam  Review of Systems  Psychiatric/Behavioral: Positive for hallucinations (Auditory hallucinations). Negative for depression, memory loss, substance abuse and suicidal ideas. The patient is not nervous/anxious and does not have insomnia.     Blood pressure 116/84, pulse (!) 103, temperature 97.9 F (36.6 C), temperature source Oral, resp. rate 18, height 5\' 8"  (1.727 m), weight 71.7 kg (158 lb), SpO2 98 %.Body mass index is 24.02 kg/m.  General Appearance: Casual and Well Groomed, a more  reactive affect, laughs inappropriately when talking about hearing God's voice.  Eye Contact:  Good  Speech:  Clear and Coherent and Normal Rate  Volume:  Normal  Mood:  Euphoric, hypomanic  Affect:  Full Range,    Thought Process:  Coherent and Descriptions of Associations: Intact  Orientation:  Full (Time, Place, and Person)  Thought Content:  Illogical, Delusions and Ideas of Reference:   Delusions, continues to endorse hearing the voice God.  Suicidal Thoughts:  Denies any thoughts, plans or intent.  Homicidal Thoughts:  Denies any thoughts, plans or intent.  Memory:  Immediate;   Good Recent;   Good Remote;   Good  Judgement:  Fair  Insight:  Fair  Psychomotor Activity:  Normal  Concentration:  Concentration: Good and Attention Span: Good  Recall:  [employer] of Knowledge:  Fair  Language:  Good  Akathisia:  Negative  Handed:  Right  AIMS (if indicated):     Assets:  Communication Skills Desire for Improvement Social Support  ADL's:  Intact  Cognition:  WNL  Sleep:  Number of Hours: 4.5   Treatment Plan Summary: Patient presents to as hypomanic with a full range of affect, euphoric at times. Still has evidence of psychosis (admits hearing God's voice still).. No evidence of dangerousness. We are continuing his treatment as recommended.    Psychiatric: Schizophrenia.  Medical: HTN: Continue Lisinopril 5 mg daily. Diabetes mellitus: Will continue to treat as recommended. Hypercholesteremia: Continue Lipitor 20 mg Q evenings. PAD: Continue Plavix 75 mg daily.  Psychosocial:  Recently unemployed from being laid off. Married. Will encourage group counseling attendance & participation.  PLAN: 1.03-02-17: No changes made on the current plan of care, continue current regimen as recommended.  Mood Stabilization/control:  Continue Latuda 40 mg daily.  Agitation/Neuropathic pain: Continue Neurontin 300 mg at bedtime.  Anxiety: Continue Klonopin 0.5 mg  bid. Continue Hydroxyzine 25 mg prn Q 6 hours.  Insomnia: Continue Trazodone 50 mg Q hs prn..  2. Continue to monitor mood, behavior and interaction with peers.  Social worker continue work the discharge disposition.  Encarnacion Slates, NP, PMHNP, FNP-BC 03/02/2017, 2:38 PM

## 2017-03-02 NOTE — Progress Notes (Signed)
Inpatient Diabetes Program Recommendations  AACE/ADA: New Consensus Statement on Inpatient Glycemic Control (2015)  Target Ranges:  Prepandial:   less than 140 mg/dL      Peak postprandial:   less than 180 mg/dL (1-2 hours)      Critically ill patients:  140 - 180 mg/dL   Lab Results  Component Value Date   GLUCAP 357 (H) 03/02/2017    Review of Glycemic Control  Clarification of bolus insulin orders:   Inpatient Diabetes Program Recommendations:     Novolog per custom scale Q4H 120-165 mg/dL - 1  Unit 166- 211 mg/dL - 2 units 212- 257 mg/dL - 3 units 258- 303 mg/dL - 4 units 304- 349 mg/dL - 5 units 350- 395 mg/dL - 6 units 396- 441 mg/dL - 7 units  Novolog 0- 6 units prn for snacks. Novolog 1 unit for every 6 grams of CHO  Novolog 12 units tid for meal coverage Novolog 1 unit for every 6 grams of CHO  Will follow. Please call for any questions.  Thank you. Lorenda Peck, RD, LDN, CDE Inpatient Diabetes Coordinator [phone]

## 2017-03-02 NOTE — Progress Notes (Signed)
Patient denies SI, HI and AVH.  Patient reports a decrease in auditory hallucinations and has had no incidence of behavioral dyscontrol.   Continue to monitor as prescribed, engage patient in 1:1 staff talks, offer medications as prescribed.   Continue to monitor as prescribed.

## 2017-03-02 NOTE — Progress Notes (Signed)
  D:  Pt was laying in bed during the assessment. When asked about his day pt stated, "quiet".Writer attempted to get pt to engage in conversation, however, pt only answered with one word. Pt expressed that he "listens to [employer] voice".   A:  Support and encouragement was offered. 15 min checks continued for safety.  R: Pt remains safe.

## 2017-03-02 NOTE — [facility] Group Notes (Signed)
[place] Group Notes:  (Counselor/Nursing/MHT/Case Management/Adjunct)  03/02/2017 1:15PM  Type of Therapy:  Group Therapy  Participation Level:  Active  Participation Quality:  Appropriate  Affect:  Flat  Cognitive:  Oriented  Insight:  Improving  Engagement in Group:  Limited  Engagement in Therapy:  Limited  Modes of Intervention:  Discussion, Exploration and Socialization  Summary of Progress/Problems: The topic for group was balance in life.  Pt participated in the discussion about when their life was in balance and out of balance and how this feels.  Pt discussed ways to get back in balance and short term goals they can work on to get where they want to be.  Stayed the entire time, engaged throughout. Stated that he is not balanced today "because now I am overly happy," and indeed his mood almost seems euphoric.  [name] furthermore stated his interpretation of God's voice previously was inaccurate; he now understands that the voice was there to calm him, not aggravate him. Furthermore, he finds that prayer is the thing that helps him find balance in his life.   Trish Mage 03/02/2017 3:35 PM

## 2017-03-02 NOTE — Evaluation (Signed)
Physical Therapy Evaluation Patient Details Name: Edgar Huynh MRN: [id number] DOB: 1955-12-16 Today's Date: 03/02/2017   History of Present Illness   this 61 year old  male with medical hx of diabetes Mellitus , HTN & hypercholesterolemia. Admitted to the [facility] 03/01/17 from the [facility] with complaints of auditory hallucinations. Reports indicate that patient also cited other stressors within his family which include recent death of father, disable wife & job loss. He was brought to the hospital for mental health evaluation & possible treatment.   Clinical Impression  The patient does not demonstrate any balance deficits while ambulating and during  Testing. No further PT needs at this time. He does not require the RW at this time. PT will sign off.  Follow Up Recommendations No PT follow up    Equipment Recommendations  None recommended by PT    Recommendations for Other Services       Precautions / Restrictions Precautions Precautions: None      Mobility  Bed Mobility Overal bed mobility: Independent                Transfers Overall transfer level: Independent                  Ambulation/Gait Ambulation/Gait assistance: Independent              Stairs            Wheelchair Mobility    Modified Rankin (Stroke Patients Only)       Balance Overall balance assessment: Independent           Standing balance-Leahy Scale: Normal               High level balance activites: Direction changes;Turns;Sudden stops High Level Balance Comments: patient demonstrates no balance  deficitas with single leg stance, tandem, turns,.             Pertinent Vitals/Pain Pain Assessment: Faces Faces Pain Scale: Hurts a little bit Pain Location: back Pain Intervention(s): Monitored during session    Home Living Family/patient expects to be discharged to:: Private residence Living Arrangements: Spouse/significant other Available  Help at Discharge: Family Type of Home: House Home Access: Stairs to enter   Technical brewer of Steps: a few Home Layout: Two level Home Equipment: None      Prior Function Level of Independence: Independent               Hand Dominance        Extremity/Trunk Assessment   Upper Extremity Assessment Upper Extremity Assessment: Overall WFL for tasks assessed    Lower Extremity Assessment Lower Extremity Assessment: Overall WFL for tasks assessed    Cervical / Trunk Assessment Cervical / Trunk Assessment: Normal  Communication   Communication: No difficulties  Cognition Arousal/Alertness: Awake/alert Behavior During Therapy: Impulsive;WFL for tasks assessed/performed Overall Cognitive Status: Within Functional Limits for tasks assessed                                 General Comments: frequently requests that " fall risk" band be removed as his mother will visit and he does not want her to see the band.      General Comments      Exercises     Assessment/Plan    PT Assessment Patent does not need any further PT services  PT Problem List         PT  Treatment Interventions      PT Goals (Current goals can be found in the Care Plan section)  Acute Rehab PT Goals Patient Stated Goal: to get  off the fall risk. PT Goal Formulation: All assessment and education complete, DC therapy    Frequency     Barriers to discharge        Co-evaluation               AM-PAC PT "6 Clicks" Daily Activity  Outcome Measure Difficulty turning over in bed (including adjusting bedclothes, sheets and blankets)?: None Difficulty moving from lying on back to sitting on the side of the bed? : None Difficulty sitting down on and standing up from a chair with arms (e.g., wheelchair, bedside commode, etc,.)?: None Help needed moving to and from a bed to chair (including a wheelchair)?: None Help needed walking in hospital room?: None Help needed  climbing 3-5 steps with a railing? : None 6 Click Score: 24    End of Session   Activity Tolerance: Patient tolerated treatment well Patient left:  (at nurses desk) Nurse Communication: Mobility status PT Visit Diagnosis: Difficulty in walking, not elsewhere classified (R26.2)    Time: 4709-2957 PT Time Calculation (min) (ACUTE ONLY): 12 min   Charges:   PT Evaluation $PT Eval Low Complexity: 1 Procedure     PT G CodesTresa Huynh PT [phone]  Edgar Huynh 03/02/2017, 3:48 PM

## 2017-03-03 DIAGNOSIS — I10 Essential (primary) hypertension: Secondary | ICD-10-CM

## 2017-03-03 DIAGNOSIS — I739 Peripheral vascular disease, unspecified: Secondary | ICD-10-CM

## 2017-03-03 DIAGNOSIS — F419 Anxiety disorder, unspecified: Secondary | ICD-10-CM

## 2017-03-03 DIAGNOSIS — E119 Type 2 diabetes mellitus without complications: Secondary | ICD-10-CM

## 2017-03-03 DIAGNOSIS — R440 Auditory hallucinations: Secondary | ICD-10-CM

## 2017-03-03 DIAGNOSIS — E7800 Pure hypercholesterolemia, unspecified: Secondary | ICD-10-CM

## 2017-03-03 LAB — GLUCOSE, CAPILLARY
GLUCOSE-CAPILLARY: 71 mg/dL (ref 65–99)
GLUCOSE-CAPILLARY: 140 mg/dL — AB (ref 65–99)
GLUCOSE-CAPILLARY: 387 mg/dL — AB (ref 65–99)
GLUCOSE-CAPILLARY: 278 mg/dL — AB (ref 65–99)
Glucose-Capillary: 104 mg/dL — ABNORMAL HIGH (ref 65–99)
Glucose-Capillary: 204 mg/dL — ABNORMAL HIGH (ref 65–99)
Glucose-Capillary: 212 mg/dL — ABNORMAL HIGH (ref 65–99)

## 2017-03-03 LAB — HEMOGLOBIN A1C
Hgb A1c MFr Bld: 6.9 % — ABNORMAL HIGH (ref 4.8–5.6)
MEAN PLASMA GLUCOSE: 151 mg/dL

## 2017-03-03 LAB — PROLACTIN: PROLACTIN: 25.6 ng/mL — AB (ref 4.0–15.2)

## 2017-03-03 NOTE — Progress Notes (Signed)
[facility] MD Progress Note  03/03/2017 9:47 AM Edgar Huynh  MRN:  [id number]  Subjective: I'm doing fine and god has been talking with me and giving me driving directions how to drive my vehicle on the street and helping me how to answer questions today"   Objective: Patient seen by this M.D., case discussed with the treatment team and reviewed the chart. Patient appeared calm, cooperative and pleasant and also actively participating in milieu therapy and counseling sessions. She reported his wife is concerned about his mental condition when he started screaming and using foul language while in the shower because he had the voice of god telling him how to control his blood sugars and how he can stop taking his medication and then he found sudden elevated blood sugars which made him upset and angry. Patient has no complaints today but expressed his delusional thoughts of talking with the god and god is giving different kind of directions including answering my questions. Patient stated he has a goals of attending group sessions and actively participating in learning coping skills to deal with his emotional difficulties and delusional thoughts. Patient has been taking his medication, and currently adjusting without significant adverse effects. Patient denies current suicidal/homicidal ideation, intention or plans. Patient has no evidence of responding to internal stimuli.  Principal Problem: MDD (major depressive disorder), single episode, severe with psychotic features ([place])  Diagnosis:   Patient Active Problem List   Diagnosis Date Noted  . MDD (major depressive disorder), single episode, severe with psychotic features ([place]) [F32.3] 03/01/2017  . Adjustment disorder with mixed anxiety and depressed mood [F43.23] 02/28/2017  . 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 Diabetic retinopathy ([place]) [E11.319] 10/03/2012  . GERD (gastroesophageal reflux disease) [K21.9] 04/04/2012  . Diabetic neuropathy  ([place]) [E11.40] 04/04/2012  . DM (diabetes mellitus) type I, controlled, with peripheral vascular disorder ([place]) [E10.51] 02/04/2009  . 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  Past Medical History:  Past Medical History:  Diagnosis Date  . Hypercholesteremia   . Hypertension   . Type 1 diabetes [facility])     Past Surgical History:  Procedure Laterality Date  . CAROTID ARTERY ANGIOPLASTY     Family History: History reviewed. No pertinent family history.  Family Psychiatric  History: See H&P.  Social History:  History  Alcohol Use  . Yes    Comment: occasional     History  Drug Use No    Social History   Social History  . Marital status: Married    Spouse name: N/A  . Number of children: N/A  . Years of education: N/A   Social History Main Topics  . Smoking status: Former Research scientist (life sciences)  . Smokeless tobacco: Never Used  . Alcohol use Yes     Comment: occasional  . Drug use: No  . Sexual activity: Not Asked   Other Topics Concern  . None   Social History Narrative  . None   Additional Social History:    Pain Medications: See MAR Prescriptions: See MAR Over the Counter: See MAR History of alcohol / drug use?: No history of alcohol / drug abuse Negative Consequences of Use: Work / School  Sleep: Good  Appetite:  Good  Current Medications: Current Facility-Administered Medications  Medication Dose Route Frequency Provider Last Rate Last Dose  . acetaminophen (TYLENOL) tablet 650 mg  650 mg Oral Q6H PRN  Laverle Hobby, PA-C      . alum & mag hydroxide-simeth (MAALOX/MYLANTA) 200-200-20 MG/5ML suspension 30 mL  30 mL Oral Q4H PRN Laverle Hobby, PA-C      . atorvastatin (LIPITOR) tablet 20 mg  20 mg Oral QPM Patriciaann Clan E, PA-C   20 mg at 03/02/17 1834  . clonazePAM (KLONOPIN) tablet 0.5 mg  0.5 mg Oral BID Laverle Hobby, PA-C    0.5 mg at 03/03/17 0754  . clopidogrel (PLAVIX) tablet 75 mg  75 mg Oral QPM Laverle Hobby, PA-C   75 mg at 03/02/17 1835  . gabapentin (NEURONTIN) capsule 300 mg  300 mg Oral QHS Patriciaann Clan E, PA-C   300 mg at 03/02/17 2301  . hydrOXYzine (ATARAX/VISTARIL) tablet 25 mg  25 mg Oral Q6H PRN Laverle Hobby, PA-C   25 mg at 03/03/17 0012  . insulin aspart (novoLOG) injection 0-15 Units  0-15 Units Subcutaneous Q4H Hampton Abbot, MD   3 Units at 03/02/17 2029  . insulin aspart (novoLOG) injection 0-15 Units  0-15 Units Subcutaneous TID WC Valda Lamb, Prentiss Bells, MD   10 Units at 03/02/17 1830  . insulin aspart (novoLOG) injection 0-15 Units  0-15 Units Subcutaneous PRN Hampton Abbot, MD   2 Units at 03/01/17 2200  . insulin glargine (LANTUS) injection 28 Units  28 Units Subcutaneous Q0600 Hampton Abbot, MD   28 Units at 03/03/17 0622  . lisinopril (PRINIVIL,ZESTRIL) tablet 5 mg  5 mg Oral QPM Patriciaann Clan E, PA-C   5 mg at 03/02/17 1834  . lurasidone (LATUDA) tablet 40 mg  40 mg Oral Q breakfast Laverle Hobby, PA-C   40 mg at 03/03/17 0753  . magnesium hydroxide (MILK OF MAGNESIA) suspension 30 mL  30 mL Oral Daily PRN Patriciaann Clan E, PA-C      . methocarbamol (ROBAXIN) tablet 500 mg  500 mg Oral Q6H PRN Lindell Spar I, NP   500 mg at 03/01/17 2205  . multivitamin with minerals tablet 1 tablet  1 tablet Oral Daily Laverle Hobby, PA-C   1 tablet at 03/03/17 0753  . pantoprazole (PROTONIX) EC tablet 40 mg  40 mg Oral Daily Patriciaann Clan E, PA-C   40 mg at 03/03/17 0753  . timolol (TIMOPTIC) 0.5 % ophthalmic solution 1 drop  1 drop Both Eyes BID Laverle Hobby, PA-C   1 drop at 03/03/17 0753  . traZODone (DESYREL) tablet 50 mg  50 mg Oral QHS PRN Lindell Spar I, NP   50 mg at 03/02/17 2301   Lab Results:  Results for orders placed or performed during the hospital encounter of 03/01/17 (from the past 48 hour(s))  Glucose, capillary     Status: Abnormal   Collection Time:  03/01/17  9:55 AM  Result Value Ref Range   Glucose-Capillary 227 (H) 65 - 99 mg/dL   Comment 1 Notify RN   Glucose, capillary     Status: Abnormal   Collection Time: 03/01/17 12:27 PM  Result Value Ref Range   Glucose-Capillary 107 (H) 65 - 99 mg/dL   Comment 1 Notify RN   Glucose, capillary     Status: Abnormal   Collection Time: 03/01/17  4:01 PM  Result Value Ref Range   Glucose-Capillary 145 (H) 65 - 99 mg/dL   Comment 1 Notify RN    Comment 2 Document in Chart   Glucose, capillary     Status: Abnormal   Collection Time: 03/01/17  8:00 PM  Result Value Ref Range   Glucose-Capillary 254 (H) 65 - 99 mg/dL  Glucose, capillary     Status: Abnormal   Collection Time: 03/01/17 10:10 PM  Result Value Ref Range   Glucose-Capillary 193 (H) 65 - 99 mg/dL  Glucose, capillary     Status: Abnormal   Collection Time: 03/02/17 12:00 AM  Result Value Ref Range   Glucose-Capillary 173 (H) 65 - 99 mg/dL  Glucose, capillary     Status: Abnormal   Collection Time: 03/02/17  4:00 AM  Result Value Ref Range   Glucose-Capillary 217 (H) 65 - 99 mg/dL  Glucose, capillary     Status: Abnormal   Collection Time: 03/02/17  6:36 AM  Result Value Ref Range   Glucose-Capillary 181 (H) 65 - 99 mg/dL  Hemoglobin A1c     Status: Abnormal   Collection Time: 03/02/17  6:45 AM  Result Value Ref Range   Hgb A1c MFr Bld 6.9 (H) 4.8 - 5.6 %    Comment: (NOTE)         Pre-diabetes: 5.7 - 6.4         Diabetes: >6.4         Glycemic control for adults with diabetes: <7.0    Mean Plasma Glucose 151 mg/dL    Comment: (NOTE) Performed At: [facility] [place], [place] [id number] Lindon Romp MD WJ:[id number] Performed at [facility], [place] [address] [place], [place] [address]   Lipid panel     Status: Abnormal   Collection Time: 03/02/17  6:45 AM  Result Value Ref Range   Cholesterol 206 (H) 0 - 200 mg/dL   Triglycerides 193 (H) <150 mg/dL   HDL 48 >40  mg/dL   Total CHOL/HDL Ratio 4.3 RATIO   VLDL 39 0 - 40 mg/dL   LDL Cholesterol 119 (H) 0 - 99 mg/dL    Comment:        Total Cholesterol/HDL:CHD Risk Coronary Heart Disease Risk Table                     Men   Women  1/2 Average Risk   3.4   3.3  Average Risk       5.0   4.4  2 X Average Risk   9.6   7.1  3 X Average Risk  23.4   11.0        Use the calculated Patient Ratio above and the CHD Risk Table to determine the patient's CHD Risk.        ATP III CLASSIFICATION (LDL):  <100     mg/dL   Optimal  100-129  mg/dL   Near or Above                    Optimal  130-159  mg/dL   Borderline  160-189  mg/dL   High  >190     mg/dL   Very High Performed at [facility] [address], [place],  [address]   TSH     Status: None   Collection Time: 03/02/17  6:45 AM  Result Value Ref Range   TSH 2.128 0.350 - 4.500 uIU/mL    Comment: Performed by a 3rd Generation assay with a functional sensitivity of <=0.01 uIU/mL. Performed at [facility], [place] [address], [place],  [address]   Prolactin     Status: Abnormal   Collection Time: 03/02/17  6:45 AM  Result Value Ref Range   Prolactin 25.6 (H) 4.0 - 15.2 ng/mL    Comment: (NOTE) Performed At: [facility] [place], [place] [id number] Lindon Romp MD OA:[id number] Performed at [facility], [place] [address], [place], [place] [address]   Glucose, capillary     Status: Abnormal   Collection Time: 03/02/17  8:27 AM  Result Value Ref Range   Glucose-Capillary 357 (H) 65 - 99 mg/dL  Glucose, capillary     Status: Abnormal   Collection Time: 03/02/17 11:59 AM  Result Value Ref Range   Glucose-Capillary 288 (H) 65 - 99 mg/dL   Comment 1 Notify RN    Comment 2 Document in Chart   Glucose, capillary     Status: Abnormal   Collection Time: 03/02/17  4:02 PM  Result Value Ref Range   Glucose-Capillary 294 (H) 65 - 99 mg/dL  Glucose, capillary      Status: Abnormal   Collection Time: 03/02/17  8:07 PM  Result Value Ref Range   Glucose-Capillary 268 (H) 65 - 99 mg/dL   Comment 1 Notify RN   Glucose, capillary     Status: Abnormal   Collection Time: 03/02/17 10:15 PM  Result Value Ref Range   Glucose-Capillary 209 (H) 65 - 99 mg/dL   Comment 1 Notify RN   Glucose, capillary     Status: Abnormal   Collection Time: 03/03/17 12:03 AM  Result Value Ref Range   Glucose-Capillary 104 (H) 65 - 99 mg/dL  Glucose, capillary     Status: None   Collection Time: 03/03/17  4:14 AM  Result Value Ref Range   Glucose-Capillary 71 65 - 99 mg/dL  Glucose, capillary     Status: Abnormal   Collection Time: 03/03/17  6:38 AM  Result Value Ref Range   Glucose-Capillary 140 (H) 65 - 99 mg/dL   Blood Alcohol level:  Lab Results  Component Value Date   ETH <5 23/55/7322   Metabolic Disorder Labs: Lab Results  Component Value Date   HGBA1C 6.9 (H) 03/02/2017   MPG 151 03/02/2017   Lab Results  Component Value Date   PROLACTIN 25.6 (H) 03/02/2017   Lab Results  Component Value Date   CHOL 206 (H) 03/02/2017   TRIG 193 (H) 03/02/2017   HDL 48 03/02/2017   CHOLHDL 4.3 03/02/2017   VLDL 39 03/02/2017   LDLCALC 119 (H) 03/02/2017   Physical Findings: AIMS: Facial and Oral Movements Muscles of Facial Expression: None, normal Lips and Perioral Area: None, normal Jaw: None, normal Tongue: None, normal,Extremity Movements Upper (arms, wrists, hands, fingers): None, normal Lower (legs, knees, ankles, toes): None, normal, Trunk Movements Neck, shoulders, hips: None, normal, Overall Severity Severity of abnormal movements (highest score from questions above): None, normal Incapacitation due to abnormal movements: None, normal Patient's awareness of abnormal movements (rate only patient's report): No Awareness, Dental Status Current problems with teeth and/or dentures?: No Does patient usually wear dentures?: No  CIWA:    COWS:      Musculoskeletal: Strength & Muscle Tone: within normal limits Gait & Station: unsteady, Walks with walter to aid his ambulation & balance. Patient leans: N/A  Psychiatric Specialty Exam: Physical Exam  Review of Systems  Psychiatric/Behavioral: Positive for hallucinations (Auditory hallucinations). Negative for depression, memory loss, substance abuse and suicidal ideas. The patient is not nervous/anxious and does not have insomnia.     Blood pressure (!) 141/83, pulse 96, temperature 98 F (36.7 C), resp. rate  16, height 5\' 8"  (1.727 m), weight 71.7 kg (158 lb), SpO2 98 %.Body mass index is 24.02 kg/m.  General Appearance: Casual and Well Groomed.  Eye Contact:  Good  Speech:  Clear and Coherent and Normal Rate  Volume:  Normal  Mood:  Euphoric, hypomanic  Affect:  Full Range,    Thought Process:  Coherent and Descriptions of Associations: Intact  Orientation:  Full (Time, Place, and Person)  Thought Content:  Illogical, Delusions and Ideas of Reference:   Delusions, continues to endorse hearing the voice God.  Suicidal Thoughts:  Denies any thoughts, plans or intent.  Homicidal Thoughts:  Denies any thoughts, plans or intent.  Memory:  Immediate;   Good Recent;   Good Remote;   Good  Judgement:  Fair  Insight:  Fair  Psychomotor Activity:  Normal  Concentration:  Concentration: Good and Attention Span: Good  Recall:  [employer] of Knowledge:  Fair  Language:  Good  Akathisia:  Negative  Handed:  Right  AIMS (if indicated):     Assets:  Communication Skills Desire for Improvement Social Support  ADL's:  Intact  Cognition:  WNL  Sleep:  Number of Hours: 4.5   Treatment Plan Summary: Patient presents to as hypomanic with a full range of affect, euphoric at times. Still has evidence of psychosis (admits hearing God's voice still).. No evidence of dangerousness. We are continuing his treatment as recommended.    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  Medical: HTN: Continue Lisinopril 5 mg daily. Diabetes mellitus: Will continue to treat as recommended. Hypercholesteremia: Continue Lipitor 20 mg Q evenings and diet control PAD: Continue Plavix 75 mg daily.  Psychosocial:  Recently unemployed from being laid off. Married. Will encourage group counseling attendance & participation.  PLAN: 03-03-17: No changes made on the current plan of care, continue current regimen as recommended.  Mood Stabilization/control: Continue Latuda 40 mg daily.  Agitation/Neuropathic pain: Continue Neurontin 300 mg at bedtime.  Anxiety: Continue Klonopin 0.5 mg bid and Hydroxyzine 25 mg prn Q 6 hours.  Insomnia: Continue Trazodone 50 mg Qhs prn..  Continue to monitor mood, behavior and interaction with peers.  Social worker continue work the discharge disposition.  Ambrose Finland, MD 03/03/2017, 9:47 AM

## 2017-03-03 NOTE — Progress Notes (Signed)
Pt reports he has had a good day.  He denies SI/HI/AVH.  He attended evening group.  Pt's wife visited him this evening.  She informed staff that her cell number was not working and gave a new number to call as she is going out of town for a few days.  This number is [phone].  Toward bedtime, pt expressed concern about not being able to sleep tonight.  Pt was given Trazodone 50 mg along with his scheduled Neurontin per request.  Pt has been pleasant and cooperative.  Support and encouragement offered.  Safety maintained with q15 minute checks.

## 2017-03-03 NOTE — BHH Suicide Risk Assessment (Signed)
[place] INPATIENT:  Family/Significant Other Suicide Prevention Education  Suicide Prevention Education:  Education Completed; Edgar Huynh, wife, [phone] has been identified by the patient as the family member/significant other with whom the patient will be residing, and identified as the person(s) who will aid the patient in the event of a mental health crisis (suicidal ideations/suicide attempt).  With written consent from the patient, the family member/significant other has been provided the following suicide prevention education, prior to the and/or following the discharge of the patient.  The suicide prevention education provided includes the following:  Suicide risk factors  Suicide prevention and interventions  National Suicide Hotline telephone number  [facility] assessment telephone number  [facility] Emergency Assistance [place] and/or Residential Mobile Crisis Unit telephone number  Request made of family/significant other to:  Remove weapons (e.g., guns, rifles, knives), all items previously/currently identified as safety concern.    Remove drugs/medications (over-the-counter, prescriptions, illicit drugs), all items previously/currently identified as a safety concern.  The family member/significant other verbalizes understanding of the suicide prevention education information provided.  The family member/significant other agrees to remove the items of safety concern listed above. Edgar Huynh reports that his symptoms of following God's voice to do strange things, as well as an increase in agitation and angry outbursts, have been going for a year, well beyond the time he found out that his job is being eliminated. The behavior has been exhibited by going out, buying a beer and drinking it in a single draw, going to get groceries and ending up being gone for several hours just driving around "because God was telling me what roads to turn on," and God  telling him that God changed his mind about his salvation, and he and all his loved ones are going to hell.  Each of these incidents resulted in an angry outburst as his wife. His work has given him 3 weeks of paid leave to try to get back on track. Plan for d/c is still unknown. If he returns to [place],  He will follow up with a psychiatrist he has already seen there.  If he stays with his wife and mother in [place], he will need follow up locally.  Edgar Huynh assured me if they return to [place], she will get rid of the guns in the home with the help of her son or son-in-law.  [place] 03/03/2017, 10:42 AM

## 2017-03-03 NOTE — Progress Notes (Signed)
Patient attended karaoke, but did not participate.

## 2017-03-03 NOTE — Progress Notes (Signed)
Recreation Therapy Notes  Date: 03/03/2017 Time: 10:00am Location: [address] Dayroom  Group Topic: Leisure Education  Goal Area(s) Addresses:  Pt will successfully identify three positive leisure activities. Pt will successfully identify benefits of participating in leisure activities.  Behavioral Response: Engaged  Intervention: Game  Activity: Pts will work together to identify leisure activities that begin with each letter of the alphabet.  Education: Leisure Education, Dentist  Education Outcome: Acknowledges understanding   Clinical Observations/Feedback: Pt actively participated in activity. Pt identified the zoo, church and [place]. Pt identified that participating in leisure activities allows him to escape mundane tasks, offers a stress release and allows him to become more social.  Donovan Kail, Recreation Therapy Intern  Victorino Sparrow, LRT/CTRS

## 2017-03-03 NOTE — BHH Group Notes (Signed)
[facility] Mental Health Association Group Therapy  03/03/2017 , 1:05 PM    Type of Therapy:  Mental Health Association Presentation  Participation Level:  Active  Participation Quality:  Attentive  Affect:  Blunted  Cognitive:  Oriented  Insight:  Limited  Engagement in Therapy:  Engaged  Modes of Intervention:  Discussion, Education and Socialization  Summary of Progress/Problems:  Shanon Brow from [facility] came to present his recovery story and play the guitar.  Stayed the entire time, engaged throughout.  "I am convinced that God will heal my diabetes because he told me he would."  [place] B 03/03/2017 , 1:05 PM

## 2017-03-03 NOTE — Plan of Care (Signed)
Problem: Self-Concept: Goal: Level of anxiety will decrease Outcome: Progressing Pt rated his anxiety level 0/10 on self inventory form this shift. Observed to be calm, engaged in unit activities and getting his needs met safely thus far.   Problem: Safety: Goal: Ability to remain free from injury will improve Outcome: Progressing Pt remains on routine safety checks without self harm gestures.

## 2017-03-03 NOTE — Progress Notes (Signed)
D: Pt visible in milieu at intervals during shift. Presents with flat affect and depressed mood. A & O X3. Denies SI, HI, VH and pain when assessed. Reports decreased in AH "I always hear voices, I hear God's voice and it's soft ". Reports fair sleep and good appetite. Rates his depression, hopelessness and anxiety all 0/10 on self inventory sheet. Pt's goal this shift "attend groups, meet others and share experiences". Pt visible in groups, engaged and observed interacting well with peers and staff.  A: All medications administered as prescribed, effects monitored. Support and encouragement provided to pt as needed. Q 15 minutes safety checks maintained without issues thus far.  R: Pt receptive to care. Compliant with medications as ordered. Denies adverse drug reactions. Tolerates all PO intake well. Remains safe on and off unit.

## 2017-03-04 LAB — GLUCOSE, CAPILLARY
GLUCOSE-CAPILLARY: 105 mg/dL — AB (ref 65–99)
GLUCOSE-CAPILLARY: 124 mg/dL — AB (ref 65–99)
GLUCOSE-CAPILLARY: 188 mg/dL — AB (ref 65–99)
GLUCOSE-CAPILLARY: 202 mg/dL — AB (ref 65–99)
GLUCOSE-CAPILLARY: 217 mg/dL — AB (ref 65–99)
Glucose-Capillary: 141 mg/dL — ABNORMAL HIGH (ref 65–99)
Glucose-Capillary: 191 mg/dL — ABNORMAL HIGH (ref 65–99)
Glucose-Capillary: 234 mg/dL — ABNORMAL HIGH (ref 65–99)
Glucose-Capillary: 286 mg/dL — ABNORMAL HIGH (ref 65–99)

## 2017-03-04 MED ORDER — QUETIAPINE FUMARATE 50 MG PO TABS
50.0000 mg | ORAL_TABLET | Freq: Every day | ORAL | Status: DC
Start: 2017-03-04 — End: 2017-03-07
  Administered 2017-03-04 – 2017-03-05 (×2): 50 mg via ORAL
  Filled 2017-03-04: qty 1
  Filled 2017-03-04: qty 7
  Filled 2017-03-04 (×5): qty 1

## 2017-03-04 NOTE — Progress Notes (Signed)
Recreation Therapy Notes  Date: 03/04/2017 Time: 10:00am Location: 500 [address] Dayroom  Group Topic: Communication and Team-Building  Goal Area(s) Addresses:  Pts will successfully communicate with team members. Pts will successfully work with team members.  Intervention: STEM Activity  Activity: Pts were split into two teams of three. Each team received 20 plastic drinking straws and approximately 24 inches of masking tape. Pts were instructed to build a free-standing bridge with the material they were given that could hold a hardcover book.  Education: Clinical research associate, Warden/ranger, Discharge Planning  Education Outcome: Acknowledges understanding  Clinical Observations/Feedback: : Pt stated that he was not going to attend group and that his nurse said he needed to "take a break." Pt did not attend group.  Donovan Kail, Recreation Therapy Intern  Victorino Sparrow, LRT/CTRS

## 2017-03-04 NOTE — Progress Notes (Signed)
Pt reports that he had a good day.  He continues to deny SI/HI/AVH.  He has been pleasant and appropriate on the unit.  He spent most of the evening the dayroom watching TV, but he also attended evening karaoke group.  Staff continues to monitor pt's CBGs as ordered.  Pt appropriate and cooperative.  Pt hopes to discharge home soon.  His only complaint is that he has not been able to sleep well while being in the hospital.  He declined the Trazodone tonight, saying that it was probably the reason he could not sleep last night. He says after he took it last night, he remembered that he has taken it in the past and it had the opposite affect on him.  Support and encouragement offered. Pt was encouraged to discuss his insomnia with the doctor tomorrow.  Discharge plans are in process.  Safety maintained with q15 minute checks.

## 2017-03-04 NOTE — Progress Notes (Signed)
[facility] MD Progress Note  03/04/2017 11:56 AM Edgar Huynh  MRN:  [id number]  Subjective: Edgar Huynh reports, "I feel very tired. That is because I have not slept in 2 nights. I had a reaction to Trazodone, instead of helping me to sleep, it gave me the opposite reaction. It kept me up. The [place] itself is not working for me. It will not stop the voice. The voice if from God. It is a positive thing for me. There is no medicine out that there that you guys will give me that will stop the voice. I heard that Seroquel makes people feel very drowsy".  Objective: Edgar Huynh is seen, chart reviewed, case discussed with the treatment team. Edgar Huynh presents calm, quiet & cooperative today. He presents with a restricted affect, making good eye contact. He is lying down in his bed. He says he is feeling very tired today because he has not slept in 2 nights. He says Trazodone instead of helping him to sleep, did the opposite, it kept him up all night. He did not participate in the morning milieu therapy and counseling sessions this morning because of the tiredness. Edgar Huynh continues to express his delusional thoughts of hearing the voice of God which he described as positive. He says the [place] is not working for him. He does not think that any other antipsychotic will work for him. He says there is no medication out there that will make the voice of God he is hearing stop. He does not agree or disagree with taking Seroquel, only that it makes people feel drowsy. He continues to take his medication, and currently adjusting without significant adverse effects. Patient denies current suicidal/homicidal ideation, intention or plans. Patient has evidence of responding to internal stimuli, other than hearing the voice of God, he relays to the providers that God is answering some of the assessment questions. The Latuda will be discontinued today & a trial of Seroquel 50 mg Q hs will initiated.   Principal Problem: MDD (major depressive  disorder), single episode, severe with psychotic features ([place])  Diagnosis:   Patient Active Problem List   Diagnosis Date Noted  . MDD (major depressive disorder), single episode, severe with psychotic features ([place]) [F32.3] 03/01/2017  . Adjustment disorder with mixed anxiety and depressed mood [F43.23] 02/28/2017  . 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 Diabetic retinopathy ([place]) [E11.319] 10/03/2012  . GERD (gastroesophageal reflux disease) [K21.9] 04/04/2012  . Diabetic neuropathy ([place]) [E11.40] 04/04/2012  . DM (diabetes mellitus) type I, controlled, with peripheral vascular disorder ([place]) [E10.51] 02/04/2009  . 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  Past Medical History:  Past Medical History:  Diagnosis Date  . Hypercholesteremia   . Hypertension   . Type 1 diabetes [facility])     Past Surgical History:  Procedure Laterality Date  . CAROTID ARTERY ANGIOPLASTY     Family History: History reviewed. No pertinent family history.  Family Psychiatric  History: See H&P.  Social History:  History  Alcohol Use  . Yes    Comment: occasional     History  Drug Use No    Social History   Social History  . Marital status: Married    Spouse name: N/A  . Number of children: N/A  . Years of education: N/A   Social History Main Topics  . Smoking status: Former Research scientist (life sciences)  . Smokeless tobacco: Never  Used  . Alcohol use Yes     Comment: occasional  . Drug use: No  . Sexual activity: Not Asked   Other Topics Concern  . None   Social History Narrative  . None   Additional Social History:    Pain Medications: See MAR Prescriptions: See MAR Over the Counter: See MAR History of alcohol / drug use?: No history of alcohol / drug abuse Negative Consequences of Use: Work / School  Sleep: Poor  Appetite:  Good  Current  Medications: Current Facility-Administered Medications  Medication Dose Route Frequency Provider Last Rate Last Dose  . acetaminophen (TYLENOL) tablet 650 mg  650 mg Oral Q6H PRN Laverle Hobby, PA-C      . alum & mag hydroxide-simeth (MAALOX/MYLANTA) 200-200-20 MG/5ML suspension 30 mL  30 mL Oral Q4H PRN Patriciaann Clan E, PA-C      . atorvastatin (LIPITOR) tablet 20 mg  20 mg Oral QPM Patriciaann Clan E, PA-C   20 mg at 03/03/17 1819  . clonazePAM (KLONOPIN) tablet 0.5 mg  0.5 mg Oral BID Laverle Hobby, PA-C   0.5 mg at 03/04/17 0931  . clopidogrel (PLAVIX) tablet 75 mg  75 mg Oral QPM Laverle Hobby, PA-C   75 mg at 03/03/17 1819  . gabapentin (NEURONTIN) capsule 300 mg  300 mg Oral QHS Patriciaann Clan E, PA-C   300 mg at 03/03/17 2234  . hydrOXYzine (ATARAX/VISTARIL) tablet 25 mg  25 mg Oral Q6H PRN Laverle Hobby, PA-C   25 mg at 03/03/17 0012  . insulin aspart (novoLOG) injection 0-15 Units  0-15 Units Subcutaneous Q4H Hampton Abbot, MD   1 Units at 03/04/17 0930  . insulin aspart (novoLOG) injection 0-15 Units  0-15 Units Subcutaneous TID WC Valda Lamb, Prentiss Bells, MD   6 Units at 03/04/17 0930  . insulin aspart (novoLOG) injection 0-15 Units  0-15 Units Subcutaneous PRN Hampton Abbot, MD   2 Units at 03/01/17 2200  . insulin glargine (LANTUS) injection 28 Units  28 Units Subcutaneous Q0600 Hampton Abbot, MD   28 Units at 03/04/17 0631  . lisinopril (PRINIVIL,ZESTRIL) tablet 5 mg  5 mg Oral QPM Patriciaann Clan E, PA-C   5 mg at 03/03/17 1819  . lurasidone (LATUDA) tablet 40 mg  40 mg Oral Q breakfast Laverle Hobby, PA-C   40 mg at 03/04/17 0973  . magnesium hydroxide (MILK OF MAGNESIA) suspension 30 mL  30 mL Oral Daily PRN Patriciaann Clan E, PA-C      . methocarbamol (ROBAXIN) tablet 500 mg  500 mg Oral Q6H PRN Lindell Spar I, NP   500 mg at 03/01/17 2205  . multivitamin with minerals tablet 1 tablet  1 tablet Oral Daily Laverle Hobby, PA-C   1 tablet at 03/04/17 5329  .  pantoprazole (PROTONIX) EC tablet 40 mg  40 mg Oral Daily Laverle Hobby, PA-C   40 mg at 03/04/17 9242  . timolol (TIMOPTIC) 0.5 % ophthalmic solution 1 drop  1 drop Both Eyes BID Laverle Hobby, PA-C   1 drop at 03/04/17 6834  . traZODone (DESYREL) tablet 50 mg  50 mg Oral QHS PRN Lindell Spar I, NP   50 mg at 03/02/17 2301   Lab Results:  Results for orders placed or performed during the hospital encounter of 03/01/17 (from the past 48 hour(s))  Glucose, capillary     Status: Abnormal   Collection Time: 03/02/17 11:59 AM  Result Value Ref Range  Glucose-Capillary 288 (H) 65 - 99 mg/dL   Comment 1 Notify RN    Comment 2 Document in Chart   Glucose, capillary     Status: Abnormal   Collection Time: 03/02/17  4:02 PM  Result Value Ref Range   Glucose-Capillary 294 (H) 65 - 99 mg/dL  Glucose, capillary     Status: Abnormal   Collection Time: 03/02/17  8:07 PM  Result Value Ref Range   Glucose-Capillary 268 (H) 65 - 99 mg/dL   Comment 1 Notify RN   Glucose, capillary     Status: Abnormal   Collection Time: 03/02/17 10:15 PM  Result Value Ref Range   Glucose-Capillary 209 (H) 65 - 99 mg/dL   Comment 1 Notify RN   Glucose, capillary     Status: Abnormal   Collection Time: 03/03/17 12:03 AM  Result Value Ref Range   Glucose-Capillary 104 (H) 65 - 99 mg/dL  Glucose, capillary     Status: None   Collection Time: 03/03/17  4:14 AM  Result Value Ref Range   Glucose-Capillary 71 65 - 99 mg/dL  Glucose, capillary     Status: Abnormal   Collection Time: 03/03/17  6:38 AM  Result Value Ref Range   Glucose-Capillary 140 (H) 65 - 99 mg/dL  Glucose, capillary     Status: Abnormal   Collection Time: 03/03/17  8:22 AM  Result Value Ref Range   Glucose-Capillary 387 (H) 65 - 99 mg/dL   Comment 1 Notify RN   Glucose, capillary     Status: Abnormal   Collection Time: 03/03/17 11:55 AM  Result Value Ref Range   Glucose-Capillary 278 (H) 65 - 99 mg/dL   Comment 1 Notify RN    Comment 2  Document in Chart   Glucose, capillary     Status: Abnormal   Collection Time: 03/03/17  4:28 PM  Result Value Ref Range   Glucose-Capillary 212 (H) 65 - 99 mg/dL  Glucose, capillary     Status: Abnormal   Collection Time: 03/03/17  8:00 PM  Result Value Ref Range   Glucose-Capillary 204 (H) 65 - 99 mg/dL   Comment 1 Notify RN   Glucose, capillary     Status: Abnormal   Collection Time: 03/03/17 10:16 PM  Result Value Ref Range   Glucose-Capillary 124 (H) 65 - 99 mg/dL  Glucose, capillary     Status: Abnormal   Collection Time: 03/04/17 12:10 AM  Result Value Ref Range   Glucose-Capillary 105 (H) 65 - 99 mg/dL   Comment 1 Notify RN   Glucose, capillary     Status: Abnormal   Collection Time: 03/04/17  4:09 AM  Result Value Ref Range   Glucose-Capillary 141 (H) 65 - 99 mg/dL   Comment 1 Notify RN   Glucose, capillary     Status: Abnormal   Collection Time: 03/04/17  6:36 AM  Result Value Ref Range   Glucose-Capillary 202 (H) 65 - 99 mg/dL   Comment 1 Notify RN    Blood Alcohol level:  Lab Results  Component Value Date   ETH <5 44/31/5400   Metabolic Disorder Labs: Lab Results  Component Value Date   HGBA1C 6.9 (H) 03/02/2017   MPG 151 03/02/2017   Lab Results  Component Value Date   PROLACTIN 25.6 (H) 03/02/2017   Lab Results  Component Value Date   CHOL 206 (H) 03/02/2017   TRIG 193 (H) 03/02/2017   HDL 48 03/02/2017   CHOLHDL 4.3 03/02/2017  VLDL 39 03/02/2017   LDLCALC 119 (H) 03/02/2017   Physical Findings: AIMS: Facial and Oral Movements Muscles of Facial Expression: None, normal Lips and Perioral Area: None, normal Jaw: None, normal Tongue: None, normal,Extremity Movements Upper (arms, wrists, hands, fingers): None, normal Lower (legs, knees, ankles, toes): None, normal, Trunk Movements Neck, shoulders, hips: None, normal, Overall Severity Severity of abnormal movements (highest score from questions above): None, normal Incapacitation due to  abnormal movements: None, normal Patient's awareness of abnormal movements (rate only patient's report): No Awareness, Dental Status Current problems with teeth and/or dentures?: No Does patient usually wear dentures?: No  CIWA:    COWS:     Musculoskeletal: Strength & Muscle Tone: within normal limits Gait & Station: unsteady, Walks with walter to aid his ambulation & balance. Patient leans: N/A  Psychiatric Specialty Exam: Physical Exam  Review of Systems  Psychiatric/Behavioral: Positive for hallucinations (Auditory hallucinations). Negative for depression, memory loss, substance abuse and suicidal ideas. The patient is not nervous/anxious and does not have insomnia.     Blood pressure 116/77, pulse (!) 102, temperature 98.5 F (36.9 C), temperature source Oral, resp. rate 16, height 5\' 8"  (1.727 m), weight 71.7 kg (158 lb), SpO2 98 %.Body mass index is 24.02 kg/m.  General Appearance: Casual and Well Groomed.  Eye Contact:  Good  Speech:  Clear and Coherent and Normal Rate  Volume:  Normal  Mood:  Euphoric, hypomanic  Affect:  Full Range,    Thought Process:  Coherent and Descriptions of Associations: Intact  Orientation:  Full (Time, Place, and Person)  Thought Content:  Illogical, Delusions and Ideas of Reference:   Delusions, continues to endorse hearing the voice God.  Suicidal Thoughts:  Denies any thoughts, plans or intent.  Homicidal Thoughts:  Denies any thoughts, plans or intent.  Memory:  Immediate;   Good Recent;   Good Remote;   Good  Judgement:  Fair  Insight:  Fair  Psychomotor Activity:  Normal  Concentration:  Concentration: Good and Attention Span: Good  Recall:  [employer] of Knowledge:  Fair  Language:  Good  Akathisia:  Negative  Handed:  Right  AIMS (if indicated):     Assets:  Communication Skills Desire for Improvement Social Support  ADL's:  Intact  Cognition:  WNL  Sleep:  Number of Hours: 6.25   Treatment Plan Summary: Patient presents  to as calm, quiet. Still has evidence of psychosis (admits hearing God's voice still).. No evidence of dangerousness. We are continuing his treatment as recommended, however, will discontinue the Latuda today & initiate Seroquel 50 mg..    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  Medical: HTN: Continue Lisinopril 5 mg daily. Diabetes mellitus: Will continue to treat as recommended. Hypercholesteremia: Continue Lipitor 20 mg Q evenings and diet control PAD: Continue Plavix 75 mg daily.  Psychosocial:  Recently unemployed from being laid off. Married. Will encourage group counseling attendance & participation.  PLAN: 03-04-17: New changes made on the current plan of care, continue current regimen as recommended.  Mood Stabilization/control: Disontinue Latuda 40 mg today.  Initiate Seroquel 50 mg Qhs.  Agitation/Neuropathic pain: Continue Neurontin 300 mg at bedtime.  Anxiety: Continue Klonopin 0.5 mg bid and Hydroxyzine 25 mg prn Q 6 hours.  Insomnia: Continue Trazodone 50 mg Qhs prn..  Continue to monitor mood, behavior and interaction with peers.  Social worker continue work the discharge disposition.  Encarnacion Slates, NP, [place] 03/04/2017, 11:56 AM Patient ID: Ike Bene, male  DOB: 08/27/1955, 61 y.o.   MRN: [id number]

## 2017-03-04 NOTE — BHH Group Notes (Signed)
[place] LCSW Group Therapy  03/04/2017  1:05 PM  Type of Therapy:  Group therapy  Participation Level:  Active  Participation Quality:  Attentive  Affect:  Flat  Cognitive:  Oriented  Insight:  Limited  Engagement in Therapy:  Limited  Modes of Intervention:  Discussion, Socialization  Summary of Progress/Problems:  Chaplain was here to lead a group on themes of hope and courage. Invited.  Chose to not attend.  Roque Lias B 03/04/2017 1:11 PM

## 2017-03-04 NOTE — Progress Notes (Signed)
Patient denies SI, HI and VH.  Patient reports decreased sleep and increased irritability. Patient has been sleep for the majority of the shift. Patient states "I always hear the voice of God talking to me.  Patient has had no incident of behavioral dyscontrol this shift.    Assess patient for safety, offer medications as prescribed, engage patient in 1:1 staff talks.  Patient able to contract for safety, continue to monitor as planned.

## 2017-03-04 NOTE — Progress Notes (Signed)
Adult Psychoeducational Group Note  Date:  03/04/2017 Time:  8:43 PM  Group Topic/Focus:  Wrap-Up Group:   The focus of this group is to help patients review their daily goal of treatment and discuss progress on daily workbooks.  Participation Level:  Active  Participation Quality:  Appropriate  Affect:  Appropriate  Cognitive:  Appropriate  Insight: Appropriate  Engagement in Group:  Engaged  Modes of Intervention:  Discussion  Additional Comments:  The patient attend all groups today.the patient also rates today a 8.  Edgar Huynh 03/04/2017, 8:43 PM

## 2017-03-05 LAB — GLUCOSE, CAPILLARY
GLUCOSE-CAPILLARY: 201 mg/dL — AB (ref 65–99)
GLUCOSE-CAPILLARY: 228 mg/dL — AB (ref 65–99)
GLUCOSE-CAPILLARY: 197 mg/dL — AB (ref 65–99)
Glucose-Capillary: 192 mg/dL — ABNORMAL HIGH (ref 65–99)
Glucose-Capillary: 276 mg/dL — ABNORMAL HIGH (ref 65–99)
Glucose-Capillary: 170 mg/dL — ABNORMAL HIGH (ref 65–99)

## 2017-03-05 NOTE — BHH Group Notes (Signed)
[facility] Group Notes: (Clinical Social Work)   03/05/2017      Type of Therapy:  Group Therapy   Participation Level:  Did Not Attend despite MHT prompting   Selmer Dominion, LCSW 03/05/2017, 1:01 PM

## 2017-03-05 NOTE — Progress Notes (Signed)
D: Pt A & O X4. Remains hyper religious during interactions "I still hear the Lord's voice, one of the greatest things I've done recently is to bring people to christ". Attends groups when prompted. Rates his depression, hopelessness an anxiety all 0/10. Reports he's sleeping and eating well with normal energy and good concentration level on self inventory sheet.  A: Medications given as per MD's orders and effects monitored. Encouragement, support and availability provided as needed. Routine safety checks maintained without self harm gestures or outburst. R: Remains medication compliant. Denies adverse drug reactions thus far. Cooperative with unit routines. POC maintained for safety and mood stability.

## 2017-03-05 NOTE — BHH Group Notes (Signed)
Nursing Psychoeducational group - Coping skills. Patient attended and participated appropriately.

## 2017-03-05 NOTE — Progress Notes (Signed)
Pt reports he is doing fine and hopes to be discharged soon.  His main concern is sleep and hopes to be able to sleep with the Seroquel that was ordered today.  He has been pleasant and cooperative.  Pt denies SI/HI/AVH.  He stays in the dayroom most of the time watching TV with some interaction with peers.  His CBGs continue to be checked per orders with insulin given accordingly.  Support and encouragement offered. Discharge plans are in process.  Safety maintained with q15 minute checks.

## 2017-03-05 NOTE — Progress Notes (Signed)
Adult Psychoeducational Group Note  Date:  03/05/2017 Time:  8:40 PM  Group Topic/Focus:  Wrap-Up Group:   The focus of this group is to help patients review their daily goal of treatment and discuss progress on daily workbooks.  Participation Level:  Active  Participation Quality:  Appropriate  Affect:  Appropriate  Cognitive:  Appropriate  Insight: Appropriate  Engagement in Group:  Engaged  Modes of Intervention:  Discussion  Additional Comments:  The patient expressed he rates today a 10.The patient also said that he is thankful for God. Nash Shearer 03/05/2017, 8:40 PM

## 2017-03-05 NOTE — Progress Notes (Signed)
[facility] MD Progress Note  03/05/2017 9:58 AM Edgar Huynh  MRN:  [id number]  Subjective: Edgar Huynh states " I know longer hear God, I can feel him"  Objective: Edgar Huynh is awake, alert and oriented. Seen sitting in the dayroom, preoccupied with thoughts and  daily worksheet. Denies suicidal or homicidal ideation during this assessment. Edgar Huynh reports he has never attempted nor had thoughts of hurting his self or anyone else. Denies auditory or visual hallucination. Patient reports medication side effects to Trazodone states he was experiencing  restless legs and states he wasn't able to get any rest. Patient reports feeling a lot better today. Reports he is tolerating the Seroquel well.  Edgar Huynh reports interacting  well with staff and others. Patient reports he is medication compliant without mediation side effects. Reports good appetite and states he is resting well with the medication adjustment. Support, encouragement and reassurance was provided.   Principal Problem: MDD (major depressive disorder), single episode, severe with psychotic features ([place])  Diagnosis:   Patient Active Problem List   Diagnosis Date Noted  . MDD (major depressive disorder), single episode, severe with psychotic features ([place]) [F32.3] 03/01/2017  . Adjustment disorder with mixed anxiety and depressed mood [F43.23] 02/28/2017  . 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 Diabetic retinopathy ([place]) [E11.319] 10/03/2012  . GERD (gastroesophageal reflux disease) [K21.9] 04/04/2012  . Diabetic neuropathy ([place]) [E11.40] 04/04/2012  . DM (diabetes mellitus) type I, controlled, with peripheral vascular disorder ([place]) [E10.51] 02/04/2009  . 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  Past Medical History:  Past Medical History:  Diagnosis Date  . Hypercholesteremia   . Hypertension   .  Type 1 diabetes [facility])     Past Surgical History:  Procedure Laterality Date  . CAROTID ARTERY ANGIOPLASTY     Family History: History reviewed. No pertinent family history.  Family Psychiatric  History: See H&P.  Social History:  History  Alcohol Use  . Yes    Comment: occasional     History  Drug Use No    Social History   Social History  . Marital status: Married    Spouse name: N/A  . Number of children: N/A  . Years of education: N/A   Social History Main Topics  . Smoking status: Former Research scientist (life sciences)  . Smokeless tobacco: Never Used  . Alcohol use Yes     Comment: occasional  . Drug use: No  . Sexual activity: Not Asked   Other Topics Concern  . None   Social History Narrative  . None   Additional Social History:    Pain Medications: See MAR Prescriptions: See MAR Over the Counter: See MAR History of alcohol / drug use?: No history of alcohol / drug abuse Negative Consequences of Use: Work / School  Sleep: Poor improving   Appetite:  Good  Current Medications: Current Facility-Administered Medications  Medication Dose Route Frequency Provider Last Rate Last Dose  . acetaminophen (TYLENOL) tablet 650 mg  650 mg Oral Q6H PRN Laverle Hobby, PA-C      . alum & mag hydroxide-simeth (MAALOX/MYLANTA) 200-200-20 MG/5ML suspension 30 mL  30 mL Oral Q4H PRN Patriciaann Clan E, PA-C      . atorvastatin (LIPITOR) tablet 20 mg  20 mg Oral QPM Patriciaann Clan E, PA-C   20 mg at 03/04/17 1831  . clonazePAM (KLONOPIN) tablet 0.5 mg  0.5 mg Oral BID Laverle Hobby, PA-C   0.5 mg at 03/05/17 0751  . clopidogrel (PLAVIX) tablet 75 mg  75 mg Oral QPM Laverle Hobby, PA-C   75 mg at 03/04/17 1831  . gabapentin (NEURONTIN) capsule 300 mg  300 mg Oral QHS Laverle Hobby, PA-C   300 mg at 03/04/17 2029  . hydrOXYzine (ATARAX/VISTARIL) tablet 25 mg  25 mg Oral Q6H PRN Laverle Hobby, PA-C   25 mg at 03/04/17 2226  . insulin aspart (novoLOG) injection 0-15 Units  0-15 Units  Subcutaneous Q4H Hampton Abbot, MD   4 Units at 03/05/17 0800  . insulin aspart (novoLOG) injection 0-15 Units  0-15 Units Subcutaneous TID WC Philipp Ovens, MD   2 Units at 03/05/17 [phone]  . insulin aspart (novoLOG) injection 0-15 Units  0-15 Units Subcutaneous PRN Hampton Abbot, MD   2 Units at 03/01/17 2200  . insulin glargine (LANTUS) injection 28 Units  28 Units Subcutaneous Q0600 Hampton Abbot, MD   28 Units at 03/05/17 0620  . lisinopril (PRINIVIL,ZESTRIL) tablet 5 mg  5 mg Oral QPM Patriciaann Clan E, PA-C   5 mg at 03/04/17 1833  . magnesium hydroxide (MILK OF MAGNESIA) suspension 30 mL  30 mL Oral Daily PRN Patriciaann Clan E, PA-C      . methocarbamol (ROBAXIN) tablet 500 mg  500 mg Oral Q6H PRN Lindell Spar I, NP   500 mg at 03/04/17 2226  . multivitamin with minerals tablet 1 tablet  1 tablet Oral Daily Laverle Hobby, PA-C   1 tablet at 03/05/17 0751  . pantoprazole (PROTONIX) EC tablet 40 mg  40 mg Oral Daily Patriciaann Clan E, PA-C   40 mg at 03/05/17 0751  . QUEtiapine (SEROQUEL) tablet 50 mg  50 mg Oral QHS Lindell Spar I, NP   50 mg at 03/04/17 2029  . timolol (TIMOPTIC) 0.5 % ophthalmic solution 1 drop  1 drop Both Eyes BID Laverle Hobby, PA-C   1 drop at 03/05/17 0750  . traZODone (DESYREL) tablet 50 mg  50 mg Oral QHS PRN Lindell Spar I, NP   50 mg at 03/02/17 2301   Lab Results:  Results for orders placed or performed during the hospital encounter of 03/01/17 (from the past 48 hour(s))  Glucose, capillary     Status: Abnormal   Collection Time: 03/03/17 11:55 AM  Result Value Ref Range   Glucose-Capillary 278 (H) 65 - 99 mg/dL   Comment 1 Notify RN    Comment 2 Document in Chart   Glucose, capillary     Status: Abnormal   Collection Time: 03/03/17  4:28 PM  Result Value Ref Range   Glucose-Capillary 212 (H) 65 - 99 mg/dL  Glucose, capillary     Status: Abnormal   Collection Time: 03/03/17  8:00 PM  Result Value Ref Range   Glucose-Capillary 204 (H)  65 - 99 mg/dL   Comment 1 Notify RN   Glucose, capillary     Status: Abnormal   Collection Time: 03/03/17 10:16 PM  Result Value Ref Range   Glucose-Capillary 124 (H) 65 - 99 mg/dL  Glucose, capillary     Status: Abnormal   Collection Time: 03/04/17 12:10 AM  Result Value Ref Range   Glucose-Capillary 105 (H) 65 - 99 mg/dL   Comment 1 Notify RN   Glucose, capillary     Status: Abnormal   Collection Time: 03/04/17  4:09 AM  Result Value Ref Range  Glucose-Capillary 141 (H) 65 - 99 mg/dL   Comment 1 Notify RN   Glucose, capillary     Status: Abnormal   Collection Time: 03/04/17  6:36 AM  Result Value Ref Range   Glucose-Capillary 202 (H) 65 - 99 mg/dL   Comment 1 Notify RN   Glucose, capillary     Status: Abnormal   Collection Time: 03/04/17 12:12 PM  Result Value Ref Range   Glucose-Capillary 234 (H) 65 - 99 mg/dL   Comment 1 Notify RN    Comment 2 Document in Chart   Glucose, capillary     Status: Abnormal   Collection Time: 03/04/17  4:18 PM  Result Value Ref Range   Glucose-Capillary 286 (H) 65 - 99 mg/dL  Glucose, capillary     Status: Abnormal   Collection Time: 03/04/17  8:03 PM  Result Value Ref Range   Glucose-Capillary 217 (H) 65 - 99 mg/dL   Comment 1 Notify RN   Glucose, capillary     Status: Abnormal   Collection Time: 03/04/17  9:58 PM  Result Value Ref Range   Glucose-Capillary 188 (H) 65 - 99 mg/dL   Comment 1 Notify RN   Glucose, capillary     Status: Abnormal   Collection Time: 03/04/17 11:59 PM  Result Value Ref Range   Glucose-Capillary 191 (H) 65 - 99 mg/dL   Comment 1 Notify RN   Glucose, capillary     Status: Abnormal   Collection Time: 03/05/17  4:07 AM  Result Value Ref Range   Glucose-Capillary 201 (H) 65 - 99 mg/dL   Comment 1 Notify RN   Glucose, capillary     Status: Abnormal   Collection Time: 03/05/17  6:32 AM  Result Value Ref Range   Glucose-Capillary 192 (H) 65 - 99 mg/dL   Comment 1 Notify RN    Blood Alcohol level:  Lab  Results  Component Value Date   ETH <5 81/19/1478   Metabolic Disorder Labs: Lab Results  Component Value Date   HGBA1C 6.9 (H) 03/02/2017   MPG 151 03/02/2017   Lab Results  Component Value Date   PROLACTIN 25.6 (H) 03/02/2017   Lab Results  Component Value Date   CHOL 206 (H) 03/02/2017   TRIG 193 (H) 03/02/2017   HDL 48 03/02/2017   CHOLHDL 4.3 03/02/2017   VLDL 39 03/02/2017   LDLCALC 119 (H) 03/02/2017   Physical Findings: AIMS: Facial and Oral Movements Muscles of Facial Expression: None, normal Lips and Perioral Area: None, normal Jaw: None, normal Tongue: None, normal,Extremity Movements Upper (arms, wrists, hands, fingers): None, normal Lower (legs, knees, ankles, toes): None, normal, Trunk Movements Neck, shoulders, hips: None, normal, Overall Severity Severity of abnormal movements (highest score from questions above): None, normal Incapacitation due to abnormal movements: None, normal Patient's awareness of abnormal movements (rate only patient's report): No Awareness, Dental Status Current problems with teeth and/or dentures?: No Does patient usually wear dentures?: No  CIWA:    COWS:     Musculoskeletal: Strength & Muscle Tone: within normal limits Gait & Station: unsteady, Walks with walter to aid his ambulation & balance. Patient leans: N/A  Psychiatric Specialty Exam: Physical Exam  Vitals reviewed. Cardiovascular: Normal rate.   Neurological: He is alert.  Psychiatric: He has a normal mood and affect. His behavior is normal.    Review of Systems  Psychiatric/Behavioral: Positive for hallucinations (Auditory hallucinations). Negative for depression, memory loss, substance abuse and suicidal ideas. The patient is not nervous/anxious  and does not have insomnia.     Blood pressure 109/80, pulse (!) 106, temperature 98.5 F (36.9 C), temperature source Oral, resp. rate 18, height 5\' 8"  (1.727 m), weight 71.7 kg (158 lb), SpO2 98 %.Body mass index  is 24.02 kg/m.  General Appearance: Casual and Well Groomed.  Eye Contact:  Good  Speech:  Clear and Coherent and Normal Rate  Volume:  Normal  Mood:  Euphoric hyper religous   Affect:  Full Range,    Thought Process:  Coherent and Descriptions of Associations: Intact  Orientation:  Full (Time, Place, and Person)  Thought Content:  Delusions and Ideas of Reference:   Delusions, continues to endorse hearing the voice God.  Suicidal Thoughts:  Denies any thoughts, plans or intent.  Homicidal Thoughts:  Denies any thoughts, plans or intent.  Memory:  Immediate;   Good Recent;   Good Remote;   Good  Judgement:  Fair  Insight:  Fair  Psychomotor Activity:  Normal  Concentration:  Concentration: Good and Attention Span: Good  Recall:  [employer] of Knowledge:  Fair  Language:  Good  Akathisia:  Negative  Handed:  Right  AIMS (if indicated):     Assets:  Desire for Improvement Social Support  ADL's:  Intact  Cognition:  WNL  Sleep:  Number of Hours: 6.25     I agree with current treatment plan on 03/05/2017, Patient seen face-to-face for psychiatric evaluation follow-up, chart reviewed. Reviewed the information documented and agree with the treatment plan.  Treatment Plan Summary:  Psychiatric: Schizophrenia vs delusional disorder.- unstable  Medical: HTN: Continue Lisinopril 5 mg daily. Diabetes mellitus: Will continue to treat as recommended. Hypercholesteremia: Continue Lipitor 20 mg Q evenings and diet control PAD: Continue Plavix 75 mg daily.  Psychosocial:  Recently unemployed from being laid off. Married. Will encourage group counseling attendance & participation.  PLAN: 03-05-17: Continue current plan of care, continue current regimen as recommended, except where noted.   Mood Stabilization/control: Disontinue Latuda 40 mg Continue Seroquel 50 mg Qhs.  Agitation/Neuropathic pain:  Continue Neurontin 300 mg at bedtime.  Anxiety:  Continue Klonopin 0.5 mg  bid  Continue Hydroxyzine 25 mg prn Q 6 hours.  Insomnia:  Discontinue Trazodone 50 mg Qhs prn.  Continue to monitor mood, behavior and interaction with peers. Social worker continue work the discharge disposition.  Derrill Center, NP, PMHP 03/05/2017, 9:58 AM

## 2017-03-06 LAB — GLUCOSE, CAPILLARY
GLUCOSE-CAPILLARY: 133 mg/dL — AB (ref 65–99)
GLUCOSE-CAPILLARY: 278 mg/dL — AB (ref 65–99)
GLUCOSE-CAPILLARY: 304 mg/dL — AB (ref 65–99)
Glucose-Capillary: 104 mg/dL — ABNORMAL HIGH (ref 65–99)
Glucose-Capillary: 105 mg/dL — ABNORMAL HIGH (ref 65–99)
Glucose-Capillary: 126 mg/dL — ABNORMAL HIGH (ref 65–99)
Glucose-Capillary: 128 mg/dL — ABNORMAL HIGH (ref 65–99)
Glucose-Capillary: 76 mg/dL (ref 65–99)

## 2017-03-06 NOTE — Plan of Care (Signed)
Problem: Coping: Goal: Ability to identify and develop effective coping behavior will improve Outcome: Progressing Nurse discussed depression/anxiety/coping skills with patient.

## 2017-03-06 NOTE — Progress Notes (Signed)
D:  Patient's self inventory sheet, patient sleeps good, sleep medication helpful.  Good appetite, normal energy level, good concentration.  Denied depression, hopeless and anxiety.  Denied withdrawals.  Denied SI.  Denied physical problems.  Denied physical pain.  Goal is attend groups, meet others, share experiences.  Today is the day the Lord hath made, be thankful and glad in it.  Attend groups, meet others, share experiences.  No discharge plans. A:  Medications administered per MD orders.  Emotional support and encouragement given patient. R:  Denied SI and HI, contracts for safety.  Denied A/V hallucinations.  Safety maintained with 15 minute checks.

## 2017-03-06 NOTE — Plan of Care (Addendum)
Problem: Physical Regulation: Goal: Ability to maintain clinical measurements within normal limits will improve Outcome: Progressing Patient's CBG monitored q 2 hours. Patient CBG under 300 and trending to WNL.  Problem: Safety: Goal: Periods of time without injury will increase Outcome: Progressing Patient is on q15 minute safety checks and high fall risk precautions. Patient contracts for safety on the unit and remains safe at this time.

## 2017-03-06 NOTE — Progress Notes (Signed)
Nursing Progress Note [id number]  D) Patient presents calm, pleasant and cooperative. Patient is appropriate with Probation officer and medication compliant. Patient stated his day was "an 19 out of 10". Patient denies SI/HI/AVH or pain. Patient contracts for safety on the unit. Patient reports sleeping well with current regimen. Patient denies concerns for Probation officer.  A) Emotional support given. 1:1 interaction and active listening provided. Patient medicated as prescribed. Medications and plan of care reviewed with patient. Patient verbalized understanding without further questions. Snacks and fluids provided. Opportunities for questions or concerns presented to patient. Patient encouraged to continue to work on treatment goals. Labs, vital signs and patient behavior monitored throughout shift. Patient safety maintained with q15 min safety checks. High fall risk precautions in place and reviewed with patient; patient verbalized understanding.  CBG assessed q 2 hours per MD orders. Patient insulin administered per MD orders.  R) Patient receptive to interaction with nurse. Patient remains safe on the unit at this time. Patient denies any adverse medication reactions at this time. Patient is resting in bed without complaints. Will continue to monitor.

## 2017-03-06 NOTE — Progress Notes (Signed)
Patient reports feeling "lightheaded" and states "I feel like my sugar has dropped into the eighties". Patient requests CBG be checked. MHT obtained CBG. Reading was 76 at 2252. Patient requests snack. Crackers and peanut butter given. Patient reports feeling improved. Patient in no acute distress. Will recheck at 12am.

## 2017-03-06 NOTE — Progress Notes (Signed)
[facility] MD Progress Note  03/06/2017 8:41 AM Edgar Huynh  MRN:  [id number]  Subjective: Edgar Huynh report " I slept well, and I feeling a lot better" Patient has concerns with his discharge plan.  Objective: Edgar Huynh  seen sitting in the dayroom interacting with peers. Patient reports " I need to shave but my razor is electric. " Zyree continues to deny suicidal or homicidal ideations. Denies auditory or visual hallucination during this assessment. Patient reports taking and tolerating Seroquel well.[name] reports interacting well with staff and others and states he has been attending group sessions. Reports good appetite and states he is resting well throughout the night.  Support, encouragement and reassurance was provided.   Principal Problem: MDD (major depressive disorder), single episode, severe with psychotic features ([place])  Diagnosis:   Patient Active Problem List   Diagnosis Date Noted  . MDD (major depressive disorder), single episode, severe with psychotic features ([place]) [F32.3] 03/01/2017  . Adjustment disorder with mixed anxiety and depressed mood [F43.23] 02/28/2017  . Psychosis, affective ([place]) [F39] 02/28/2017  . Diabetic retinopathy ([place]) [E11.319] 10/03/2012  . GERD (gastroesophageal reflux disease) [K21.9] 04/04/2012  . Diabetic neuropathy ([place]) [E11.40] 04/04/2012  . DM (diabetes mellitus) type I, controlled, with peripheral vascular disorder ([place]) [E10.51] 02/04/2009  . 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  Past Medical History:  Past Medical History:  Diagnosis Date  . Hypercholesteremia   . Hypertension   . Type 1 diabetes [facility])     Past Surgical History:  Procedure Laterality Date  . CAROTID ARTERY ANGIOPLASTY     Family History: History reviewed. No pertinent family history.  Family Psychiatric  History: See H&P.  Social  History:  History  Alcohol Use  . Yes    Comment: occasional     History  Drug Use No    Social History   Social History  . Marital status: Married    Spouse name: N/A  . Number of children: N/A  . Years of education: N/A   Social History Main Topics  . Smoking status: Former Research scientist (life sciences)  . Smokeless tobacco: Never Used  . Alcohol use Yes     Comment: occasional  . Drug use: No  . Sexual activity: Not Asked   Other Topics Concern  . None   Social History Narrative  . None   Additional Social History:    Pain Medications: See MAR Prescriptions: See MAR Over the Counter: See MAR History of alcohol / drug use?: No history of alcohol / drug abuse Negative Consequences of Use: Work / School  Sleep: Poor improving   Appetite:  Good  Current Medications: Current Facility-Administered Medications  Medication Dose Route Frequency Provider Last Rate Last Dose  . acetaminophen (TYLENOL) tablet 650 mg  650 mg Oral Q6H PRN Laverle Hobby, PA-C      . alum & mag hydroxide-simeth (MAALOX/MYLANTA) 200-200-20 MG/5ML suspension 30 mL  30 mL Oral Q4H PRN Patriciaann Clan E, PA-C      . atorvastatin (LIPITOR) tablet 20 mg  20 mg Oral QPM Patriciaann Clan E, PA-C   20 mg at 03/05/17 1814  . clonazePAM (KLONOPIN) tablet 0.5 mg  0.5 mg Oral BID Laverle Hobby, PA-C   0.5 mg at 03/06/17 0813  . clopidogrel (PLAVIX) tablet 75 mg  75 mg Oral QPM Simon, Spencer E, PA-C   75 mg at  03/05/17 1814  . gabapentin (NEURONTIN) capsule 300 mg  300 mg Oral QHS Patriciaann Clan E, PA-C   300 mg at 03/05/17 2100  . hydrOXYzine (ATARAX/VISTARIL) tablet 25 mg  25 mg Oral Q6H PRN Laverle Hobby, PA-C   25 mg at 03/05/17 2129  . insulin aspart (novoLOG) injection 0-15 Units  0-15 Units Subcutaneous Q4H Hampton Abbot, MD   2 Units at 03/05/17 2057  . insulin aspart (novoLOG) injection 0-15 Units  0-15 Units Subcutaneous TID WC Philipp Ovens, MD   3 Units at 03/06/17 [phone]  . insulin aspart  (novoLOG) injection 0-15 Units  0-15 Units Subcutaneous PRN Hampton Abbot, MD   2 Units at 03/01/17 2200  . insulin glargine (LANTUS) injection 28 Units  28 Units Subcutaneous Q0600 Hampton Abbot, MD   28 Units at 03/06/17 [phone]  . lisinopril (PRINIVIL,ZESTRIL) tablet 5 mg  5 mg Oral QPM Patriciaann Clan E, PA-C   5 mg at 03/05/17 1814  . magnesium hydroxide (MILK OF MAGNESIA) suspension 30 mL  30 mL Oral Daily PRN Laverle Hobby, PA-C      . methocarbamol (ROBAXIN) tablet 500 mg  500 mg Oral Q6H PRN Lindell Spar I, NP   500 mg at 03/05/17 2129  . multivitamin with minerals tablet 1 tablet  1 tablet Oral Daily Laverle Hobby, PA-C   1 tablet at 03/06/17 0813  . pantoprazole (PROTONIX) EC tablet 40 mg  40 mg Oral Daily Laverle Hobby, PA-C   40 mg at 03/06/17 3419  . QUEtiapine (SEROQUEL) tablet 50 mg  50 mg Oral QHS Lindell Spar I, NP   50 mg at 03/05/17 2058  . timolol (TIMOPTIC) 0.5 % ophthalmic solution 1 drop  1 drop Both Eyes BID Laverle Hobby, PA-C   1 drop at 03/06/17 0813  . traZODone (DESYREL) tablet 50 mg  50 mg Oral QHS PRN Lindell Spar I, NP   50 mg at 03/02/17 2301   Lab Results:  Results for orders placed or performed during the hospital encounter of 03/01/17 (from the past 48 hour(s))  Glucose, capillary     Status: Abnormal   Collection Time: 03/04/17 12:12 PM  Result Value Ref Range   Glucose-Capillary 234 (H) 65 - 99 mg/dL   Comment 1 Notify RN    Comment 2 Document in Chart   Glucose, capillary     Status: Abnormal   Collection Time: 03/04/17  4:18 PM  Result Value Ref Range   Glucose-Capillary 286 (H) 65 - 99 mg/dL  Glucose, capillary     Status: Abnormal   Collection Time: 03/04/17  8:03 PM  Result Value Ref Range   Glucose-Capillary 217 (H) 65 - 99 mg/dL   Comment 1 Notify RN   Glucose, capillary     Status: Abnormal   Collection Time: 03/04/17  9:58 PM  Result Value Ref Range   Glucose-Capillary 188 (H) 65 - 99 mg/dL   Comment 1 Notify RN   Glucose,  capillary     Status: Abnormal   Collection Time: 03/04/17 11:59 PM  Result Value Ref Range   Glucose-Capillary 191 (H) 65 - 99 mg/dL   Comment 1 Notify RN   Glucose, capillary     Status: Abnormal   Collection Time: 03/05/17  4:07 AM  Result Value Ref Range   Glucose-Capillary 201 (H) 65 - 99 mg/dL   Comment 1 Notify RN   Glucose, capillary     Status: Abnormal   Collection Time:  03/05/17  6:32 AM  Result Value Ref Range   Glucose-Capillary 192 (H) 65 - 99 mg/dL   Comment 1 Notify RN   Glucose, capillary     Status: Abnormal   Collection Time: 03/05/17 12:11 PM  Result Value Ref Range   Glucose-Capillary 276 (H) 65 - 99 mg/dL  Glucose, capillary     Status: Abnormal   Collection Time: 03/05/17  4:59 PM  Result Value Ref Range   Glucose-Capillary 170 (H) 65 - 99 mg/dL   Comment 1 Notify RN    Comment 2 Document in Chart   Glucose, capillary     Status: Abnormal   Collection Time: 03/05/17  8:06 PM  Result Value Ref Range   Glucose-Capillary 228 (H) 65 - 99 mg/dL   Comment 1 Notify RN   Glucose, capillary     Status: Abnormal   Collection Time: 03/05/17 10:10 PM  Result Value Ref Range   Glucose-Capillary 197 (H) 65 - 99 mg/dL   Comment 1 Notify RN   Glucose, capillary     Status: Abnormal   Collection Time: 03/06/17 12:04 AM  Result Value Ref Range   Glucose-Capillary 126 (H) 65 - 99 mg/dL   Comment 1 Notify RN   Glucose, capillary     Status: Abnormal   Collection Time: 03/06/17  4:15 AM  Result Value Ref Range   Glucose-Capillary 104 (H) 65 - 99 mg/dL   Comment 1 Notify RN   Glucose, capillary     Status: Abnormal   Collection Time: 03/06/17  6:14 AM  Result Value Ref Range   Glucose-Capillary 128 (H) 65 - 99 mg/dL   Comment 1 Notify RN    Blood Alcohol level:  Lab Results  Component Value Date   ETH <5 72/04/4708   Metabolic Disorder Labs: Lab Results  Component Value Date   HGBA1C 6.9 (H) 03/02/2017   MPG 151 03/02/2017   Lab Results  Component Value  Date   PROLACTIN 25.6 (H) 03/02/2017   Lab Results  Component Value Date   CHOL 206 (H) 03/02/2017   TRIG 193 (H) 03/02/2017   HDL 48 03/02/2017   CHOLHDL 4.3 03/02/2017   VLDL 39 03/02/2017   LDLCALC 119 (H) 03/02/2017   Physical Findings: AIMS: Facial and Oral Movements Muscles of Facial Expression: None, normal Lips and Perioral Area: None, normal Jaw: None, normal Tongue: None, normal,Extremity Movements Upper (arms, wrists, hands, fingers): None, normal Lower (legs, knees, ankles, toes): None, normal, Trunk Movements Neck, shoulders, hips: None, normal, Overall Severity Severity of abnormal movements (highest score from questions above): None, normal Incapacitation due to abnormal movements: None, normal Patient's awareness of abnormal movements (rate only patient's report): No Awareness, Dental Status Current problems with teeth and/or dentures?: No Does patient usually wear dentures?: No  CIWA:    COWS:     Musculoskeletal: Strength & Muscle Tone: within normal limits Gait & Station: normal Patient leans: N/A  Psychiatric Specialty Exam: Physical Exam  Vitals reviewed. Cardiovascular: Normal rate.   Neurological: He is alert.  Psychiatric: He has a normal mood and affect. His behavior is normal.    Review of Systems  Psychiatric/Behavioral: Positive for hallucinations (Auditory hallucinations). Negative for depression, memory loss, substance abuse and suicidal ideas. The patient is not nervous/anxious and does not have insomnia.     Blood pressure 132/82, pulse (!) 106, temperature (!) 97.2 F (36.2 C), resp. rate 20, height 5\' 8"  (1.727 m), weight 71.7 kg (158 lb), SpO2 98 %.Body  mass index is 24.02 kg/m.  General Appearance: Well Groomed.  Eye Contact:  Good  Speech:  Clear and Coherent and Normal Rate  Volume:  Normal  Mood:  Euthymic hyperreligous   Affect:  Full Range,    Thought Process:  Coherent and Descriptions of Associations: Intact   Orientation:  Full (Time, Place, and Person)  Thought Content:  Rumination, continues to endorse hearing the voice God.  Suicidal Thoughts:  Denies any thoughts, plans or intent.  Homicidal Thoughts:  Denies any thoughts, plans or intent.  Memory:  Immediate;   Good Recent;   Good Remote;   Good  Judgement:  Fair  Insight:  Fair  Psychomotor Activity:  Normal  Concentration:  Concentration: Good and Attention Span: Good  Recall:  [employer] of Knowledge:  Fair  Language:  Good  Akathisia:  Negative  Handed:  Right  AIMS (if indicated):     Assets:  Desire for Improvement Social Support  ADL's:  Intact  Cognition:  WNL  Sleep:  Number of Hours: 6.25     I agree with current treatment plan on 03/06/2017, Patient seen face-to-face for psychiatric evaluation follow-up, chart reviewed. Reviewed the information documented and agree with the treatment plan.  Treatment Plan Summary:  Psychiatric: Schizophrenia vs delusional disorder.- unstable  Medical: HTN: Continue Lisinopril 5 mg daily. Diabetes mellitus: Will continue to treat as recommended. Hypercholesteremia: Continue Lipitor 20 mg Q evenings and diet control PAD: Continue Plavix 75 mg daily.  Psychosocial:  Recently unemployed from being laid off. Married. Will encourage group counseling attendance & participation.  PLAN: 03-06-17: Continue current plan of care, continue current regimen as recommended, except where noted.   Mood Stabilization/control: Disontinue Latuda 40 mg Continue Seroquel 50 mg Qhs.  Agitation/Neuropathic pain:  Continue Neurontin 300 mg at bedtime.  Anxiety:  Continue Klonopin 0.5 mg bid  Continue Hydroxyzine 25 mg prn Q 6 hours.  Insomnia:  Discontinue Trazodone 50 mg Qhs prn.  Continue to monitor mood, behavior and interaction with peers. Social worker continue work the discharge disposition.  Derrill Center, NP, PMHP 03/06/2017, 8:41 AM

## 2017-03-06 NOTE — BHH Group Notes (Signed)
[place] LCSW Group Therapy  03/06/2017 11:15 AM  Type of Therapy:  Group Therapy  Participation Level:  Active  Participation Quality:  Appropriate and Attentive  Affect:  Appropriate  Cognitive:  Alert and Oriented  Insight:  Improving  Engagement in Therapy:  Improving  Modes of Intervention:  Discussion  Today's group discussed current progress in preparation for discharge. Group discussion included recognizing tools and insights gained during inpatient process to prepare for discharge. Identifying key elements for success at home including professional supports, social supports, self care and medication compliance. Also discussed assessing usefulness of coping skills in order to manage mood and emotions as you progress toward discharge. Patient was very focused that he didn't want to do anything to take care of himself, he just wanted to serve others.   Christene Lye MSW, LCSW

## 2017-03-06 NOTE — BHH Group Notes (Signed)
Pt attended wrap up group. Pt stated he met his goal by relaxing due to it being Sunday. Pt requested towels and wash clothes. Trudie Buckler, MHT

## 2017-03-07 LAB — GLUCOSE, CAPILLARY
GLUCOSE-CAPILLARY: 153 mg/dL — AB (ref 65–99)
GLUCOSE-CAPILLARY: 177 mg/dL — AB (ref 65–99)
GLUCOSE-CAPILLARY: 224 mg/dL — AB (ref 65–99)
Glucose-Capillary: 108 mg/dL — ABNORMAL HIGH (ref 65–99)

## 2017-03-07 MED ORDER — LISINOPRIL 5 MG PO TABS: 5.0000 mg | ORAL_TABLET | Freq: Every evening | ORAL | 0 refills | Status: DC

## 2017-03-07 MED ORDER — HYDROXYZINE HCL 25 MG PO TABS: 25.0000 mg | ORAL_TABLET | Freq: Four times a day (QID) | ORAL | 0 refills | Status: DC | PRN

## 2017-03-07 MED ORDER — QUETIAPINE FUMARATE 50 MG PO TABS: 50.0000 mg | ORAL_TABLET | Freq: Every day | ORAL | 0 refills | Status: DC

## 2017-03-07 NOTE — BHH Suicide Risk Assessment (Addendum)
[facility] Discharge Suicide Risk Assessment   Principal Problem: MDD (major depressive disorder), single episode, severe with psychotic features [facility]) Discharge Diagnoses:  Patient Active Problem List   Diagnosis Date Noted  . MDD (major depressive disorder), single episode, severe with psychotic features ([place]) [F32.3] 03/01/2017    Priority: High  . Adjustment disorder with mixed anxiety and depressed mood [F43.23] 02/28/2017  . Psychosis, affective ([place]) [F39] 02/28/2017  . Diabetic retinopathy ([place]) [E11.319] 10/03/2012  . GERD (gastroesophageal reflux disease) [K21.9] 04/04/2012  . Diabetic neuropathy ([place]) [E11.40] 04/04/2012  . DM (diabetes mellitus) type I, controlled, with peripheral vascular disorder ([place]) [E10.51] 02/04/2009  . 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 He should states that he's been hearing God talking to him for about a year now, adds that God told him that he would cure his diabetes, got upset when his sugars got high as he was not taking his insulin, states he felt agitated and out of control and could not tolerate God's voice.  Patient is morning states that he still hearing God's voice, as that it's never going to go away but that now it is egosyntonic. He states that it helps him stay calm, deal with his stressors. He adds that he no longer feels agitated, has any plans of stopping his insulin, has no thoughts of hurting himself or others. He states that he's eating fine, sleeping well. He adds that he does not plan to take his medications on discharge, discussed with patient the risk and need for him to stay on his medications, patient states that he will not follow-up outpatient with a psychiatrist or therapist as he feels he is doing well and does not need any psychiatric treatment. Patient does  not meet criteria for IVC for outpatient treatment, refuses further information to be given to wife or daughter in regards to discharge planning. Discussed in length with patient the need to have ongoing psychiatric care such as counseling if he is not interested in medication management and a psychiatrist at patient states that he will think about this. Patient feels he is ready for discharge and does not need to be in the hospital Total Time spent with patient: 30 minutes  Musculoskeletal: Strength & Muscle Tone: within normal limits Gait & Station: normal Patient leans: N/A  Psychiatric Specialty Exam: Review of Systems  Constitutional: Negative.  Negative for fever and weight loss.  HENT: Negative.  Negative for congestion, hearing loss and sore throat.   Eyes: Negative.  Negative for blurred vision, double vision, discharge and redness.  Respiratory: Negative for cough, shortness of breath and wheezing.   Cardiovascular: Negative.  Negative for chest pain and palpitations.  Gastrointestinal: Negative.  Negative for abdominal pain, diarrhea, heartburn, nausea and vomiting.  Genitourinary: Negative.  Negative for dysuria.  Musculoskeletal: Positive for back pain. Negative for falls and myalgias.  Skin: Negative.  Negative for rash.  Neurological: Negative for dizziness, seizures, loss of consciousness and weakness.  Endo/Heme/Allergies: Negative for environmental allergies.       Has IDDM  Psychiatric/Behavioral: Positive for hallucinations. Negative for depression, memory loss, substance abuse and suicidal ideas. The patient is not nervous/anxious and does not have insomnia.        God talking to him, not commanding, egosyntonic    Blood pressure 117/80, pulse 99, temperature 98.4 F (36.9 C), temperature source Oral,  resp. rate 18, height 5\' 8"  (1.727 m), weight 71.7 kg (158 lb), SpO2 98 %.Body mass index is 24.02 kg/m.  General Appearance: Casual  Eye Contact::  Fair  Speech:   Clear and Coherent and Normal Rate409  Volume:  Normal  Mood:  Euthymic  Affect:  Congruent and Full Range  Thought Process:  Coherent, Goal Directed and Descriptions of Associations: Intact  Orientation:  Full (Time, Place, and Person)  Thought Content:  Logical and Hallucinations: Auditory  Suicidal Thoughts:  No  Homicidal Thoughts:  No  Memory:  Immediate;   Fair Recent;   Fair Remote;   Fair  Judgement:  Fair  Insight:  Lacking  Psychomotor Activity:  Mannerisms  Concentration:  Fair  Recall:  [employer] of Knowledge:Fair  Language: Fair  Akathisia:  No  Handed:  Right  AIMS (if indicated):     Assets:  Financial Resources/Insurance Housing Social Support Transportation  Sleep:  Number of Hours: 6  Cognition: WNL  ADL's:  Intact   Mental Status Per Nursing Assessment::   On Admission:     Demographic Factors:  Male and Caucasian  Loss Factors: Financial problems/change in socioeconomic status  Historical Factors: Impulsivity  Risk Reduction Factors:   Sense of responsibility to family, Religious beliefs about death, Employed and Living with another person, especially a relative  Continued Clinical Symptoms:  Previous Psychiatric Diagnoses and Treatments  Cognitive Features That Contribute To Risk:  Closed-mindedness    Suicide Risk:  Minimal: No identifiable suicidal ideation.  Patients presenting with no risk factors but with morbid ruminations; may be classified as minimal risk based on the severity of the depressive symptoms    Plan Of Care/Follow-up recommendations:  Activity:  as tolerated Diet:  diabetic diet Other:  to take medications as prescribed and follow up outpatient with psychiatry and PCP  Hampton Abbot, MD 03/07/2017, 10:35 AM

## 2017-03-07 NOTE — Progress Notes (Signed)
Recreation Therapy Notes  Date: 03/07/2017 Time: 10:00am Location: [address] Dayroom  Group Topic: Coping Skills  Goal Area(s) Addresses:  Pt will successfully identify at least five things that are preventing them from moving forward. Pt will successfully identify at least two coping skills they can use to move forward from the places they are currently "stuck."  Behavioral Response: Engaged  Intervention: Art  Activity: Pt was given a piece of printer paper and asked to draw a spider web. Pt was asked to identify at least 5 things holding them back and 10 coping skills to help them get past the things that are holding them back. Pt then shared their spider webs with peers.  Education: Radiographer, therapeutic, Discharge Planning  Education Outcome: Acknowledges understanding   Clinical Observations/Feedback:  Pt actively participated in activity. Pt was able to identify one thing that is keeping him from reaching his goals which is staying in the hospital and his solution is for him to be discharged.  Edgar Huynh, Recreation Therapy Intern    Edgar Huynh, LRT/CTRS       Edgar Huynh 03/07/2017 11:28 AM

## 2017-03-07 NOTE — Progress Notes (Signed)
Discharge Note:  Patient discharged home with wife.  Patient denied SI and HI.  Denied A/V hallucinations.  Suicide prevention information given and discussed with patient who stated he understood and had no questions.  Patient stated he received all his belongings, clothing, toiletries, misc items, prescriptions, medications, etc.  Patient stated he appreciated all assistance from [facility] staff.  All required discharge information given to patient at discharge.

## 2017-03-07 NOTE — Progress Notes (Signed)
PATIENT SIGNED 72 HR REQUEST FOR DISCHARGE ON 03/07/2017 AT 0935.

## 2017-03-07 NOTE — Progress Notes (Signed)
Recreation Therapy Notes  INPATIENT RECREATION TR PLAN  Patient Details Name: Edgar Huynh MRN: [id number] DOB: [birth year] Today's Date: 03/07/2017  Rec Therapy Plan Is patient appropriate for Therapeutic Recreation?: Yes Treatment times per week: at least 3x Estimated Length of Stay: 5-7 days TR Treatment/Interventions: Group participation (Comment) (appropriate participation in recreation therapy group sessions)  Discharge Criteria Pt will be discharged from therapy if:: Discharged Treatment plan/goals/alternatives discussed and agreed upon by:: Patient/family  Discharge Summary Short term goals set: The Patient will attend and participate in Recreation Therapy Group Sessions  Short term goals met: Complete Progress toward goals comments: Groups attended Which groups?: Coping skills, Leisure education, Problem-solving Reason goals not met: n/a Therapeutic equipment acquired: n/a Reason patient discharged from therapy: Discharge from hospital Pt/family agrees with progress & goals achieved: Yes Date patient discharged from therapy: 03/07/17  Donovan Kail, Recreation Therapy Intern  Donovan Kail 03/07/2017, 11:00 AM   Victorino Sparrow, LRT/CTRS

## 2017-03-07 NOTE — Progress Notes (Addendum)
Nursing Progress Note [id number]  D) Patient presents pleasant and approprate. Patient reports "I wish I could check my sugar whenever I wanted". Patient is seen interactive in the milieu. Patient denies SI/HI/AVH but endorses a mild headache. Patient contracts for safety on the unit. Patient refused Seroquel this evening. CBG monitored and insulin provided as ordered.  A) Emotional support given. 1:1 interaction and active listening provided. Patient medicated as prescribed. Medications and plan of care reviewed with patient. Patient verbalized understanding without further questions. Snacks and fluids provided. Opportunities for questions or concerns presented to patient. Patient encouraged to continue to work on treatment goals. Labs, vital signs and patient behavior monitored throughout shift. Patient safety maintained with q15 min safety checks. High fall risk precautions in place and reviewed with patient; patient verbalized understanding.  R) Patient receptive to interaction with nurse. Patient remains safe on the unit at this time. Patient denies any adverse medication reactions at this time. Patient is resting in bed without complaints. Will continue to monitor.

## 2017-03-07 NOTE — Plan of Care (Signed)
Problem: [facility] Participation in Recreation Therapeutic Interventions Goal: STG-Patient will attend/participate in Rec Therapy Group Ses STG-The Patient will attend and participate in Recreation Therapy Group Sessions  Outcome: Completed/Met Date Met: 03/07/17 Pt attended and actively participated in three out of the five recreation therapy group sessions offered.  Donovan Kail, Recreation Therapy Intern

## 2017-03-07 NOTE — Discharge Summary (Signed)
Physician Discharge Summary Note  Patient:  Edgar Huynh is an 61 y.o., male MRN:  [id number] DOB:  Feb 23, 1956 Patient phone:  [phone] (home)  Patient address:   [address] [place] [address],  Total Time spent with patient: 30 minutes  Date of Admission:  03/01/2017 Date of Discharge: 03/07/2017  Reason for Admission: Per HPI- This is an admission assessment for this 61 year old Caucasian male with medical hx of diabetes Mellitus since the age of 89, HTN & hypercholesterolemia. Admitted to the [facility] from the [facility] with complaints of auditory hallucinations. Reports indicate that patient also cited other stressors within his family which include recent death of father, disable wife & job loss. He was brought to the hospital for mental health evaluation & possible treatment.  During this assessment, Edgar Huynh reports, "My daughter brought me to this hospital as a walk-in for evaluation last Saturday. I was then sent to the [facility] for evaluation. This is what happened, prior to Saturday, I got very upset while in the shower. I had done that twice within a short amount of time.  This happened in [place], where I reside with my wife. I was hearing a very distinctive voice. I knew that it was God's voice. He (God) told me that he is going to cure my diabetes. He told me to check my blood sugar every hour & not take my insulin any more. This I did by following his exact instructions. Then, each time I checked my blood sugar that morning, it was normal. Then, later in the afternoon, my blood went up. I got upset & agitated. I used few profanities. My wife got very worried & contacted my daughter who lives here in [place], [place]. I have heard God talk to me for about a year prior to all this. I'm not really depressed, just sad that I'm in this hospital. I have a lot of stress going on in my life right now".  Principal Problem: MDD (major depressive disorder),  single episode, severe with psychotic features [facility]) Discharge Diagnoses: Patient Active Problem List   Diagnosis Date Noted  . MDD (major depressive disorder), single episode, severe with psychotic features ([place]) [F32.3] 03/01/2017  . Adjustment disorder with mixed anxiety and depressed mood [F43.23] 02/28/2017  . Psychosis, affective ([place]) [F39] 02/28/2017  . Diabetic retinopathy ([place]) [E11.319] 10/03/2012  . GERD (gastroesophageal reflux disease) [K21.9] 04/04/2012  . Diabetic neuropathy ([place]) [E11.40] 04/04/2012  . DM (diabetes mellitus) type I, controlled, with peripheral vascular disorder ([place]) [E10.51] 02/04/2009  . 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 Hypercholesteremia   . Hypertension   . Type 1 diabetes [facility])     Past Surgical History:  Procedure Laterality Date  . CAROTID ARTERY ANGIOPLASTY     Family History: History reviewed. No pertinent family history. Family Psychiatric  History:  Social History:  History  Alcohol Use  . Yes    Comment: occasional     History  Drug Use No    Social History   Social History  . Marital status: Married    Spouse name: N/A  . Number of children: N/A  . Years of education: N/A   Social History Main Topics  . Smoking status: Former Research scientist (life sciences)  . Smokeless tobacco: Never Used  . Alcohol use Yes     Comment:  occasional  . Drug use: No  . Sexual activity: Not Asked   Other Topics Concern  . None   Social History Narrative  . None    Hospital Course:  Edgar Huynh was admitted for MDD (major depressive disorder), single episode, severe with psychotic features ([place]) and crisis management.  Pt was treated discharged with the medications listed below under Medication List.  Medical problems were identified and treated as needed.  Home medications were restarted as  appropriate.  Improvement was monitored by observation and Edgar Huynh 's daily report of symptom reduction.  Emotional and mental status was monitored by daily self-inventory reports completed by Edgar Huynh and clinical staff.         Edgar Huynh was evaluated by the treatment team for stability and plans for continued recovery upon discharge. Edgar Huynh 's motivation was an integral factor for scheduling further treatment. Employment, transportation, bed availability, health status, family support, and any pending legal issues were also considered during hospital stay. Pt was offered further treatment options upon discharge including but not limited to Residential, Intensive Outpatient, and Outpatient treatment.  Edgar Huynh will follow up with the services as listed below under Follow Up Information.     Upon completion of this admission the patient was both mentally and medically stable for discharge denying suicidal/homicidal ideation, auditory/visual/tactile hallucinations, delusional thoughts and paranoia.    Edgar Huynh responded well to treatment with Neurontin 300 mg and Klonopin 0.5mg  and Seroquel 50 mg  without adverse effects. Pt demonstrated improvement without reported or observed adverse effects to the point of stability appropriate for outpatient management. Pertinent labs include: Lipid panel elevaled   Prolactin 25.6 (high), Hgb A1C 6.9 (high), for which outpatient follow-up is necessary for lab recheck as mentioned below. Reviewed CBC, CMP, BAL, and UDS; all unremarkable aside from noted exceptions.   Physical Findings: AIMS: Facial and Oral Movements Muscles of Facial Expression: None, normal Lips and Perioral Area: None, normal Jaw: None, normal Tongue: None, normal,Extremity Movements Upper (arms, wrists, hands, fingers): None, normal Lower (legs, knees, ankles, toes): None, normal, Trunk Movements Neck, shoulders, hips: None, normal, Overall  Severity Severity of abnormal movements (highest score from questions above): None, normal Incapacitation due to abnormal movements: None, normal Patient's awareness of abnormal movements (rate only patient's report): No Awareness, Dental Status Current problems with teeth and/or dentures?: No Does patient usually wear dentures?: No  CIWA:  CIWA-Ar Total: 1 COWS:  COWS Total Score: 2  Musculoskeletal: Strength & Muscle Tone: within normal limits Gait & Station: normal Patient leans: N/A  Psychiatric Specialty Exam: See SRA by MD Physical Exam  Constitutional: He is oriented to person, place, and time. He appears well-developed and well-nourished.  Neurological: He is alert and oriented to person, place, and time.  Psychiatric: He has a normal mood and affect. His behavior is normal.    Review of Systems  Psychiatric/Behavioral: Negative for depression (stable), hallucinations (stable) and suicidal ideas. Nervous/anxious: stable.     Blood pressure 117/80, pulse 99, temperature 98.4 F (36.9 C), temperature source Oral, resp. rate 18, height 5\' 8"  (1.727 m), weight 71.7 kg (158 lb), SpO2 98 %.Body mass index is 24.02 kg/m.  Have you used any form of tobacco in the last 30 days? (Cigarettes, Smokeless Tobacco, Cigars, and/or Pipes): No  Has this patient used any form of tobacco in the last 30 days? (Cigarettes, Smokeless Tobacco, Cigars, and/or Pipes)  No  Blood Alcohol level:  Lab Results  Component Value Date   ETH <5 13/24/4010    Metabolic Disorder Labs:  Lab Results  Component Value Date   HGBA1C 6.9 (H) 03/02/2017   MPG 151 03/02/2017   Lab Results  Component Value Date   PROLACTIN 25.6 (H) 03/02/2017   Lab Results  Component Value Date   CHOL 206 (H) 03/02/2017   TRIG 193 (H) 03/02/2017   HDL 48 03/02/2017   CHOLHDL 4.3 03/02/2017   VLDL 39 03/02/2017   LDLCALC 119 (H) 03/02/2017    See Psychiatric Specialty Exam and Suicide Risk Assessment completed by  Attending Physician prior to discharge.  Discharge destination:  Home  Is patient on multiple antipsychotic therapies at discharge:  No   Has Patient had three or more failed trials of antipsychotic monotherapy by history:  No  Recommended Plan for Multiple Antipsychotic Therapies: NA  Discharge Instructions    Diet - low sodium heart healthy    Complete by:  As directed    Discharge instructions    Complete by:  As directed    Take all medications as prescribed. Keep all follow-up appointments as scheduled.  Do not consume alcohol or use illegal drugs while on prescription medications. Report any adverse effects from your medications to your primary care provider promptly.  In the event of recurrent symptoms or worsening symptoms, call 911, a crisis hotline, or go to the nearest emergency department for evaluation.   Increase activity slowly    Complete by:  As directed      Allergies as of 03/07/2017   No Known Allergies     Medication List    STOP taking these medications   ALPHA LIPOIC ACID PO   co-enzyme Q-10 30 MG capsule   hydrocortisone cream 1 %   insulin lispro 100 UNIT/ML injection Commonly known as:  HUMALOG     TAKE these medications     Indication  atorvastatin 20 MG tablet Commonly known as:  LIPITOR Take 20 mg by mouth every evening.  Indication:  High Amount of Fats in the Blood   clonazePAM 0.5 MG tablet Commonly known as:  KLONOPIN Take 0.5 mg by mouth 2 (two) times daily.  Indication:  mood stablization   clopidogrel 75 MG tablet Commonly known as:  PLAVIX Take 75 mg by mouth every evening.  Indication:  Acute Coronary Syndrome   gabapentin 300 MG capsule Commonly known as:  NEURONTIN Take 300 mg by mouth at bedtime.  Indication:  Agitation   hydrOXYzine 25 MG tablet Commonly known as:  ATARAX/VISTARIL Take 1 tablet (25 mg total) by mouth every 6 (six) hours as needed for anxiety.  Indication:  Feeling Anxious   insulin glargine  100 UNIT/ML injection Commonly known as:  LANTUS Inject 28 Units into the skin daily.  Indication:  Type 2 Diabetes   lisinopril 5 MG tablet Commonly known as:  PRINIVIL,ZESTRIL Take 1 tablet (5 mg total) by mouth every evening. What changed:  medication strength  Indication:  High Blood Pressure Disorder   multivitamin with minerals Tabs tablet Take 1 tablet by mouth daily.  Indication:  mulit vita   omeprazole 40 MG capsule Commonly known as:  PRILOSEC Take 40 mg by mouth daily.  Indication:  Heartburn   QUEtiapine 50 MG tablet Commonly known as:  SEROQUEL Take 1 tablet (50 mg total) by mouth at bedtime.  Indication:  Mood control   timolol 0.5 % ophthalmic solution Commonly known as:  TIMOPTIC Place 1 drop into  both eyes 2 (two) times daily.  Indication:  Angle-Closure Glaucoma        Follow-up recommendations:  Activity:  as tolerated Diet:  heart healthy  Comments: Take all medications as prescribed. Keep all follow-up appointments as scheduled.  Do not consume alcohol or use illegal drugs while on prescription medications. Report any adverse effects from your medications to your primary care provider promptly.  In the event of recurrent symptoms or worsening symptoms, call 911, a crisis hotline, or go to the nearest emergency department for evaluation.   Signed: Derrill Center, NP 03/07/2017, 9:39 AM

## 2017-03-07 NOTE — Plan of Care (Signed)
Problem: Activity: Goal: Interest or engagement in activities will improve Outcome: Progressing Patient is observed up playing cards in the dayroom. Patient taking medications and attending groups.  Problem: Safety: Goal: Periods of time without injury will increase Outcome: Progressing Patient is on q15 minute safety checks and high fall risk precautions. Patient contracts for safety on the unit and remains safe at this time.

## 2017-03-07 NOTE — Progress Notes (Signed)
D:  Patient's self inventory sheet, patient sleeps good, no sleep medication given.  Good appetite, normal energy level, good concentration.  Denied depression, hopeless and anxiety this morning.  Denied withdrawals.  Denied SI.  Denied physical problems.  Denied physical pain.  Goal is attend groups, meet people, share experiences.  Does have discharge plans. A:  Medications administered per MD orders.  Emotional support and encouragement given patient. R:  Denied SI and HI, contracts for safety.  Denied A/V hallucinations.  Safety maintained with 15 minute checks.

## 2017-03-07 NOTE — BHH Group Notes (Signed)
[facility] LCSW Aftercare Discharge Planning Group Note   03/07/2017 9:51 AM  Participation Quality:  Engaged  Mood/Affect:  Appropriate, disengaged  Plan for Discharge/Comments:  Declined all follow up, states he will make his own appointments and declined to allow CSW to schedule, plans to return home to [place]: wife or son  Supports: family  Beverely Pace

## 2017-03-07 NOTE — Social Work (Signed)
CSW spoke w wife who is aware that patient is discharging today and will come to pick him up.  Aware that patient has refused aftercare, including w PCP.  Per wife, patient saw [facility] counselor who then referred him to psychiatrist who put him on medication.  "He never went back, didn't take the medication, I dont know what to do at this point, its up/down w his feelings."  "He keeps telling me that what we are trying to do is to stop hearing the voice of God - its not that, I just want him to work on his anger."  "I realize that I cannot make him do anything", "if he follows up anywhere it would have to be in [place]"  Wife states that son in law is removing all guns from the home prior to patient returning home.  Wife feels that patient needs to be home in order to better manage his diabetes care.    Edwyna Shell, LCSW Lead Clinical Social Worker Phone:  [phone]

## 2017-03-07 NOTE — Progress Notes (Addendum)
  [facility] Adult Case Management Discharge Plan :  Will you be returning to the same living situation after discharge:  Yes,  home w wife in [place] At discharge, do you have transportation home?: Yes,  wife Do you have the ability to pay for your medications: Yes,  has insurance, no concerns expressed  Release of information consent forms completed and turned in to Medical Records  Patient to Follow up at: Follow-up Information    Patient declines Follow up.   Why:  Patient declines all follow up at this time, will not allow CSW to schedule mental health or primary care appointments.            Next level of care provider has access to [place] and Suicide Prevention discussed: Yes,  reviewed w wife, wife states son in law is removing all weapons from the home prior to patient return  Have you used any form of tobacco in the last 30 days? (Cigarettes, Smokeless Tobacco, Cigars, and/or Pipes): No  Has patient been referred to the Quitline?: Patient refused referral  Patient has been referred for addiction treatment: N/A  Beverely Pace 03/07/2017, 11:34 AM

## 2017-03-15 ENCOUNTER — Encounter

## 2017-03-15 MED ORDER — GLUCAGON EMERGENCY KIT 1 MG SOLUTION FOR INJECTION
1 mg | PACK | INTRAMUSCULAR | 0 refills | Status: DC
Start: 2017-03-15 — End: 2017-03-16

## 2017-03-16 ENCOUNTER — Encounter

## 2017-03-16 MED ORDER — GLUCAGON EMERGENCY KIT 1 MG SOLUTION FOR INJECTION
1 mg | PACK | INTRAMUSCULAR | 0 refills | Status: DC
Start: 2017-03-16 — End: 2017-07-16

## 2017-04-11 MED ORDER — ATORVASTATIN 20 MG TAB
20 mg | ORAL_TABLET | ORAL | 1 refills | Status: DC
Start: 2017-04-11 — End: 2017-04-26

## 2017-04-22 ENCOUNTER — Ambulatory Visit: Admit: 2017-04-22 | Discharge: 2017-04-22 | Payer: PRIVATE HEALTH INSURANCE | Attending: Internal Medicine

## 2017-04-22 ENCOUNTER — Inpatient Hospital Stay: Admit: 2017-04-22 | Payer: PRIVATE HEALTH INSURANCE

## 2017-04-22 DIAGNOSIS — E1040 Type 1 diabetes mellitus with diabetic neuropathy, unspecified: Secondary | ICD-10-CM

## 2017-04-22 DIAGNOSIS — E7800 Pure hypercholesterolemia, unspecified: Secondary | ICD-10-CM

## 2017-04-22 LAB — AMB POC HEMOGLOBIN A1C: Hemoglobin A1c (POC): 6.7 %

## 2017-04-22 NOTE — Progress Notes (Signed)
Assessment/Plan:    1. Type 1 diabetes mellitus with diabetic neuropathy (HCC)  -A1c 6.7.  Managed by Dr. Alvira Monday.  - AMB POC HEMOGLOBIN A1C  - HM DIABETES FOOT EXAM  - MICROALBUMIN, UR, RAND W/ MICROALB/CREAT RATIO; Future  - METABOLIC PANEL, COMPREHENSIVE; Future    2. Elevated prolactin level (HCC)  -f/u with Dr. Alvira Monday  - TSH 3RD GENERATION; Future    3. Pure hypercholesterolemia  -ck lipids. On statin  - LIPID PANEL; Future    4. Special screening examination for viral disease  - HCV AB W/RFLX TO NAA; Future      The plan was discussed with the patient.  The patient verbalized understanding and is in agreement with the plan.  All medication potential side effects were discussed with the patient.    Health Maintenance:   Health Maintenance   Topic Date Due   ??? Hepatitis C Screening  1956-03-29   ??? MICROALBUMIN Q1  10/24/2016   ??? HEMOGLOBIN A1C Q6M  09/02/2017   ??? EYE EXAM RETINAL OR DILATED Q1  12/10/2017   ??? LIPID PANEL Q1  03/02/2018   ??? FOOT EXAM Q1  04/22/2018   ??? COLONOSCOPY  08/15/2018   ??? DTaP/Tdap/Td series (2 - Td) 04/12/2024   ??? ZOSTER VACCINE AGE 40>  Completed   ??? Pneumococcal 19-64 Medium Risk  Completed   ??? Influenza Age 43 to Adult  Completed       Maurice [name] is a 61 y.o. male and presents with Hypertension and Diabetes     Subjective:  Pt had recent voluntary admission to psych hospital for auditory hallucinations.    DM1 - A1c is 6.7. Recently noted to have elevated prolactin. CT nml.  tsh was nml.    He's on lisinopril 74m.  bp has been low, systolic of 929J and is feeling dizzy. No h/o microalburinuria.     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     ROS:  Constitutional: No recent weight change. No weakness/fatigue.  No f/c.   Cardiovascular: No CP/palpitations.  No DOE/orthopnea/PND.   Respiratory: No cough/sputum, dyspnea, wheezing.   Gastointestinal: No dysphagia, reflux.  No n/v.  No constipation/diarrhea.  No melena/rectal bleeding.    Psychiatric:  No depression, anxiety.     The problem list was updated as a part of today's visit.  Patient Active Problem List   Diagnosis Code   ??? Benign hypertension I10   ??? Diabetic neuropathy (HCC) E11.40   ??? GERD (gastroesophageal reflux disease) K21.9   ??? History of nephrolithiasis Z87.442   ??? Insulin pump in place Z96.41   ??? Diabetic retinopathy ([place] E11.319   ??? Diplopia H53.2   ??? 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 Encephalomalacia on imaging study G93.89   ??? History of stroke Z86.73   ??? 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 Branch retinal vein occlusion [id number]  ??? 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 Pure hypercholesterolemia E78.00   ??? 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 Erosive esophagitis K22.10   ??? Auditory hallucination R44.0       The PSH, FH were reviewed.    SH:  Social History   Substance Use Topics   ??? Smoking status: Never Smoker   ??? Smokeless tobacco: Never Used   ??? Alcohol use No       Medications/Allergies:  Current Outpatient Prescriptions on File Prior to Visit   Medication Sig Dispense Refill   ??? atorvastatin (LIPITOR) 20 mg tablet TAKE 1 TABLET BY MOUTH DAILY 90 Tab 1   ??? GLUCAGON EMERGENCY KIT, HUMAN, 1 mg injection INJECT 1 MG IN THE MUSCLE AS NEEDED FOR LOW BLOOD SUGAR 3 Kit 0   ??? gabapentin (NEURONTIN) 300 mg capsule TAKE 1 CAPSULE BY MOUTH EVERY NIGHT AT BEDTIME 90 Cap 0   ??? lisinopril (PRINIVIL, ZESTRIL) 10 mg tablet TAKE 1 TABLET BY MOUTH DAILY 90 Tab 0   ??? clonazePAM (KLONOPIN) 0.5 mg tablet TAKE 1 TABLET BY MOUTH TWICE DAILY 180 Tab 0   ??? clopidogrel (PLAVIX) 75 mg tab TAKE 1 TABLET BY MOUTH DAILY 90 Tab 3   ??? omeprazole (PRILOSEC) 40 mg capsule TAKE 1 CAPSULE BY MOUTH DAILY 90 Cap 3   ??? Alpha Lipoic Acid 200 mg tab Take  by mouth daily.     ??? HUMALOG 100 unit/mL injection USE AS DIRECTED WITH PUMP 180 mL 12   ??? ascorbic acid (VITAMIN C) 1,000 mg tablet Take  by mouth daily.      ??? multivitamins-minerals-lutein (CENTRUM SILVER) Tab Take  by mouth daily.     ??? vitamin e (E GEMS) 1,000 unit capsule Take 1,000 Units by mouth daily. Patient is taking 400 iu     ??? Cholecalciferol, Vitamin D3, 5,000 unit Tab Take  by mouth daily. Patient is currently taking 2000 iu     ??? omega-3 fatty acids-vitamin e (FISH OIL) 1,000 mg cap Take 1 Cap by mouth daily.     ??? coenzyme q10 10 mg cap Take  by mouth two (2) times a day.     ??? lurasidone (LATUDA) 20 mg tab tablet Take 20 mg by mouth daily (with dinner).       No current facility-administered medications on file prior to visit.         No Known Allergies    Objective:  Visit Vitals   ??? BP 100/68 (BP 1 Location: Left arm, BP Patient Position: Sitting)   ??? Pulse 95   ??? Temp 98.6 ??F (37 ??C) (Oral)   ??? Resp 20   ??? Ht '5\' 8"'  (1.727 m)   ??? Wt 161 lb (73 kg)   ??? SpO2 96%   ??? BMI 24.48 kg/m2      Constitutional: Well developed, nourished, no distress, alert   CV: S1, S2.  RRR.  No murmurs/rubs.  No thrills palpated.  No carotid bruits.  Intact distal pulses.  No edema.  No aortic bruits.   Pulm: No abnormalities on inspection.  Clear to auscultation bilaterally. No wheezing/rhonchi.  Normal effort.     Psych: Appropriate affect, judgement and insight.  Short-term memory intact.     Monofilament nml

## 2017-04-22 NOTE — Progress Notes (Signed)
Clydie BraunBrady Hinshaw is a 61 y.o. male (DOB: 1955-09-20) presenting to address:    Chief Complaint   Patient presents with   ??? Hypertension   ??? Diabetes       Vitals:    04/22/17 0945   BP: 100/68   Pulse: 95   Resp: 20   Temp: 98.6 ??F (37 ??C)   TempSrc: Oral   SpO2: 96%   Weight: 161 lb (73 kg)   Height: 5\' 8"  (1.727 m)   PainSc:   0 - No pain       Learning Assessment:     Learning Assessment 12/21/2013   PRIMARY LEARNER Patient   HIGHEST LEVEL OF EDUCATION - PRIMARY LEARNER  4 YEARS OF COLLEGE   BARRIERS PRIMARY LEARNER NONE   CO-LEARNER CAREGIVER No   PRIMARY LANGUAGE ENGLISH   INTERPRETER NEED No   LEARNER PREFERENCE PRIMARY DEMONSTRATION     DEMONSTRATION   ANSWERED BY Patient   RELATIONSHIP SELF     Depression Screening:     PHQ over the last two weeks 04/22/2017   Little interest or pleasure in doing things Not at all   Feeling down, depressed, irritable, or hopeless Not at all   Total Score PHQ 2 0     Fall Risk Assessment:     Fall Risk Assessment, last 12 mths 04/22/2017   Able to walk? Yes   Fall in past 12 months? No     Abuse Screening:     Abuse Screening Questionnaire 04/22/2017   Do you ever feel afraid of your partner? N   Are you in a relationship with someone who physically or mentally threatens you? N   Is it safe for you to go home? Y     Coordination of Care Questionaire:   1. Have you been to the ER, urgent care clinic since your last visit?  Hospitalized since your last visit? YES [facility] 04/01/17    2. Have you seen or consulted any other health care providers outside of the [facility] since your last visit?  Include any pap smears or colon screening. NO    Advanced Directive:   1. Do you have an Advanced Directive? YES    2. Would you like information on Advanced Directives? NO

## 2017-04-23 LAB — METABOLIC PANEL, COMPREHENSIVE
A-G Ratio: 1.3 (ref 0.8–1.7)
ALT (SGPT): 26 U/L (ref 16–61)
AST (SGOT): 16 U/L (ref 15–37)
Albumin: 3.9 g/dL (ref 3.4–5.0)
Alk. phosphatase: 98 U/L (ref 45–117)
Anion gap: 6 mmol/L (ref 3.0–18)
BUN/Creatinine ratio: 23 — ABNORMAL HIGH (ref 12–20)
BUN: 21 MG/DL — ABNORMAL HIGH (ref 7.0–18)
Bilirubin, total: 0.4 MG/DL (ref 0.2–1.0)
CO2: 30 mmol/L (ref 21–32)
Calcium: 9.1 MG/DL (ref 8.5–10.1)
Chloride: 106 mmol/L (ref 100–108)
Creatinine: 0.9 MG/DL (ref 0.6–1.3)
GFR est AA: >60 mL/min/{1.73_m2} (ref >60)
GFR est non-AA: >60 mL/min/{1.73_m2} (ref >60)
Globulin: 3.1 g/dL (ref 2.0–4.0)
Glucose: 100 mg/dL — ABNORMAL HIGH (ref 74–99)
Potassium: 4.7 mmol/L (ref 3.5–5.5)
Protein, total: 7 g/dL (ref 6.4–8.2)
Sodium: 142 mmol/L (ref 136–145)

## 2017-04-23 LAB — CBC W/O DIFF
HCT: 47.1 % (ref 36.0–48.0)
HGB: 15.4 g/dL (ref 13.0–16.0)
MCH: 31.5 PG (ref 24.0–34.0)
MCHC: 32.7 g/dL (ref 31.0–37.0)
MCV: 96.3 FL (ref 74.0–97.0)
MPV: 11 FL (ref 9.2–11.8)
PLATELET: 283 10*3/uL (ref 135–420)
RBC: 4.89 M/uL (ref 4.70–5.50)
RDW: 13.6 % (ref 11.6–14.5)
WBC: 5.3 10*3/uL (ref 4.6–13.2)

## 2017-04-23 LAB — MICROALBUMIN, UR, RAND W/ MICROALB/CREAT RATIO
Creatinine, urine random: 49.45 mg/dL (ref 30–125)
Microalbumin,urine random: 0.4 MG/DL (ref 0–3.0)
Microalbumin/Creat ratio (mg/g creat): <8 mg/g (ref 0–30)

## 2017-04-23 LAB — HCV AB W/RFLX TO NAA
HCV AB, 144035: <0.1 s/co ratio (ref 0.0–0.9)
HCV Ab: <0.1 s/co ratio (ref 0.0–0.9)

## 2017-04-23 LAB — LIPID PANEL
CHOL/HDL Ratio: 3.6 (ref 0–5.0)
Cholesterol, total: 186 MG/DL (ref <200)
HDL Cholesterol: 52 MG/DL (ref 40–60)
LDL, calculated: 120.4 MG/DL — ABNORMAL HIGH (ref 0–100)
Triglyceride: 68 MG/DL (ref <150)
VLDL, calculated: 13.6 MG/DL

## 2017-04-23 LAB — HCV INTERPRETATION

## 2017-04-23 LAB — TSH 3RD GENERATION: TSH: 0.51 u[IU]/mL (ref 0.36–3.74)

## 2017-04-26 MED ORDER — ATORVASTATIN 40 MG TAB
40 mg | ORAL_TABLET | ORAL | 3 refills | Status: AC
Start: 2017-04-26 — End: ?

## 2017-04-29 ENCOUNTER — Encounter

## 2017-05-02 ENCOUNTER — Encounter

## 2017-05-02 MED ORDER — CLONAZEPAM 0.5 MG TAB
0.5 mg | ORAL_TABLET | Freq: Two times a day (BID) | ORAL | 0 refills | Status: DC
Start: 2017-05-02 — End: 2017-06-24

## 2017-05-02 NOTE — Telephone Encounter (Signed)
From: Maurice [name]  To: Daine GravelSusan Saamiya Jeppsen, MD  Sent: 05/02/2017 12:08 PM EDT  Subject:  Medication Renewal Request    Original  authorizing provider: Daine GravelSusan Nuria Phebus, MD    Maurice BraunBrady  [name] would like a refill of the following medications:  clonazePAM  (KLONOPIN) 0.5 mg tablet Daine Gravel[Dequane Strahan, MD]    Preferred  pharmacy: [facility] [id number] - [place], [place] - [address] [facility] [address]  [place]    Comment:

## 2017-05-24 MED ORDER — GABAPENTIN 300 MG CAP
300 mg | ORAL_CAPSULE | ORAL | 0 refills | Status: AC
Start: 2017-05-24 — End: ?

## 2017-05-30 MED ORDER — OMEPRAZOLE 40 MG CAP, DELAYED RELEASE
40 mg | ORAL_CAPSULE | ORAL | 3 refills | Status: AC
Start: 2017-05-30 — End: ?

## 2017-06-24 ENCOUNTER — Encounter

## 2017-06-24 MED ORDER — CLONAZEPAM 0.5 MG TAB
0.5 mg | ORAL_TABLET | ORAL | 1 refills | Status: AC
Start: 2017-06-24 — End: ?

## 2017-07-16 ENCOUNTER — Encounter

## 2017-07-17 ENCOUNTER — Encounter

## 2017-07-18 ENCOUNTER — Encounter

## 2017-07-18 MED ORDER — GLUCAGON EMERGENCY KIT 1 MG SOLUTION FOR INJECTION
1 mg | INTRAMUSCULAR | 0 refills | Status: AC
Start: 2017-07-18 — End: ?

## 2017-09-23 ENCOUNTER — Emergency Department: Payer: Managed Care, Other (non HMO)

## 2017-09-23 ENCOUNTER — Other Ambulatory Visit: Payer: Self-pay

## 2017-09-23 ENCOUNTER — Encounter: Payer: Self-pay | Admitting: Emergency Medicine

## 2017-09-23 ENCOUNTER — Emergency Department
Admission: EM | Admit: 2017-09-23 | Discharge: 2017-09-23 | Disposition: A | Discharge status: Home / Self Care | Payer: Managed Care, Other (non HMO) | Attending: Emergency Medicine | Admitting: Emergency Medicine

## 2017-09-23 DIAGNOSIS — R440 Auditory hallucinations: Secondary | ICD-10-CM | POA: Diagnosis present

## 2017-09-23 DIAGNOSIS — E1040 Type 1 diabetes mellitus with diabetic neuropathy, unspecified: Secondary | ICD-10-CM | POA: Insufficient documentation

## 2017-09-23 DIAGNOSIS — I10 Essential (primary) hypertension: Secondary | ICD-10-CM | POA: Diagnosis not present

## 2017-09-23 DIAGNOSIS — F419 Anxiety disorder, unspecified: Secondary | ICD-10-CM

## 2017-09-23 DIAGNOSIS — Z87891 Personal history of nicotine dependence: Secondary | ICD-10-CM | POA: Diagnosis not present

## 2017-09-23 DIAGNOSIS — Z794 Long term (current) use of insulin: Secondary | ICD-10-CM | POA: Insufficient documentation

## 2017-09-23 DIAGNOSIS — Z79899 Other long term (current) drug therapy: Secondary | ICD-10-CM | POA: Diagnosis not present

## 2017-09-23 LAB — COMPREHENSIVE METABOLIC PANEL
ALT: 21 U/L (ref 17–63)
AST: 23 U/L (ref 15–41)
Albumin: 4.2 g/dL (ref 3.5–5.0)
Alkaline Phosphatase: 103 U/L (ref 38–126)
Anion gap: 7 (ref 5–15)
BUN: 19 mg/dL (ref 6–20)
CHLORIDE: 103 mmol/L (ref 101–111)
CO2: 26 mmol/L (ref 22–32)
CREATININE: 0.98 mg/dL (ref 0.61–1.24)
Calcium: 9.2 mg/dL (ref 8.9–10.3)
GFR calc Af Amer: >60 mL/min (ref >60)
Glucose, Bld: 289 mg/dL — ABNORMAL HIGH (ref 65–99)
POTASSIUM: 4.1 mmol/L (ref 3.5–5.1)
SODIUM: 136 mmol/L (ref 135–145)
Total Bilirubin: 0.6 mg/dL (ref 0.3–1.2)
Total Protein: 7.1 g/dL (ref 6.5–8.1)

## 2017-09-23 LAB — URINALYSIS, COMPLETE (UACMP) WITH MICROSCOPIC
BILIRUBIN URINE: NEGATIVE
Bacteria, UA: NONE SEEN
Glucose, UA: 150 mg/dL — AB
Hgb urine dipstick: NEGATIVE
KETONES UR: NEGATIVE mg/dL
LEUKOCYTES UA: NEGATIVE
NITRITE: NEGATIVE
PROTEIN: NEGATIVE mg/dL
SQUAMOUS EPITHELIAL / LPF: NONE SEEN
Specific Gravity, Urine: 1.018 (ref 1.005–1.030)
pH: 6 (ref 5.0–8.0)

## 2017-09-23 LAB — URINE DRUG SCREEN, QUALITATIVE (ARMC ONLY)
Amphetamines, Ur Screen: NOT DETECTED
BARBITURATES, UR SCREEN: NOT DETECTED
BENZODIAZEPINE, UR SCRN: NOT DETECTED
Cannabinoid 50 Ng, Ur ~~LOC~~: NOT DETECTED
Cocaine Metabolite,Ur ~~LOC~~: NOT DETECTED
MDMA (Ecstasy)Ur Screen: NOT DETECTED
METHADONE SCREEN, URINE: NOT DETECTED
OPIATE, UR SCREEN: NOT DETECTED
Phencyclidine (PCP) Ur S: NOT DETECTED
TRICYCLIC, UR SCREEN: NOT DETECTED

## 2017-09-23 LAB — CBC WITH DIFFERENTIAL/PLATELET
BASOS ABS: 0.1 10*3/uL (ref 0–0.1)
BASOS PCT: 1 %
Eosinophils Absolute: 0.1 10*3/uL (ref 0–0.7)
Eosinophils Relative: 1 %
HEMATOCRIT: 45.2 % (ref 40.0–52.0)
HEMOGLOBIN: 15 g/dL (ref 13.0–18.0)
LYMPHS PCT: 20 %
Lymphs Abs: 1.4 10*3/uL (ref 1.0–3.6)
MCH: 31.1 pg (ref 26.0–34.0)
MCHC: 33.2 g/dL (ref 32.0–36.0)
MCV: 93.9 fL (ref 80.0–100.0)
MONOS PCT: 8 %
Monocytes Absolute: 0.5 10*3/uL (ref 0.2–1.0)
NEUTROS ABS: 4.9 10*3/uL (ref 1.4–6.5)
NEUTROS PCT: 70 %
Platelets: 210 10*3/uL (ref 150–440)
RBC: 4.82 MIL/uL (ref 4.40–5.90)
RDW: 14.4 % (ref 11.5–14.5)
WBC: 7 10*3/uL (ref 3.8–10.6)

## 2017-09-23 LAB — ACETAMINOPHEN LEVEL

## 2017-09-23 LAB — SALICYLATE LEVEL

## 2017-09-23 LAB — ETHANOL

## 2017-09-23 NOTE — ED Provider Notes (Signed)
-----------------------------------------   4:31 PM on 09/23/2017 -----------------------------------------  Patient care assumed from Dr. Cinda Quest.  MRI pending.  The labs are largely within normal limits. EKG reviewed and interpreted by myself shows normal sinus rhythm at 68 bpm with a narrow QRS, normal axis, normal intervals, no concerning ST changes.  Patient's MRI has resulted largely normal.  No acute abnormality.  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  Patient has been seen and evaluated by psychiatry, they believe the patient is safe to be discharged home from a psychiatric standpoint.  Patient has been prescribed Seroquel by his psychiatrist.   Harvest Dark, MD 09/23/17 [phone]

## 2017-09-23 NOTE — ED Notes (Signed)
Pt sitting in bed with spouse at side in no acute distress. resps unlabored, awaiting MRI results.

## 2017-09-23 NOTE — Consult Note (Signed)
[facility] Consult   Reason for Consult: Consult for 62 year old man referred voluntarily to be seen in the emergency room for multiple complaints of some mental health issues Referring Physician: Paduchowski Patient Identification: Edgar Huynh MRN:  [id number] Principal Diagnosis: Anxiety disorder Diagnosis:   Patient Active Problem List   Diagnosis Date Noted  . Anxiety disorder [F41.9] 09/23/2017  . MDD (major depressive disorder), single episode, severe with psychotic features ([place]) [F32.3] 03/01/2017  . Adjustment disorder with mixed anxiety and depressed mood [F43.23] 02/28/2017  . 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 Diabetic retinopathy ([place]) [E11.319] 10/03/2012  . GERD (gastroesophageal reflux disease) [K21.9] 04/04/2012  . Diabetic neuropathy ([place]) [E11.40] 04/04/2012  . DM (diabetes mellitus) type I, controlled, with peripheral vascular disorder ([place]) [E10.51] 02/04/2009  . 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 male patient admitted with "I have been dealing with some strange thoughts"   HPI: Patient interviewed.  I also spoke with Dr. Nicolasa Ducking, who had seen this patient in her office for a new intake today.  Also reviewed old notes in the chart.  62 year old man reports that for some time now, probably more than a year, he has been suffering from 2 types of complaint possibly mental health-related.  The first is a sense of intrusive thoughts in his mind that did not seem to belong to him.  At its worst this would go on all day long and was often extremely debilitating.  In the past month this has diminished greatly so that it is only taking up a few seconds during most days.  The other type of complaint is described as a feeling in his chest which ranges from a mildly irritating buzzing feeling to chest pain to at times  full-blown fear and anxiety.  This still occurs although it is relatively infrequent.  Currently sleep is much improved.  Patient denies any suicidal ideation.  He is currently not reporting having clear delusions about these intrusive thoughts.  Also, he makes it clear that they have never had the character of hallucinations in that they have never seemed to be sounds coming from outside of his head.  Patient has been able to continue working and carrying on his day-to-day personal life.  He was prescribed Lexapro 10 mg/day which she has been taking for exactly 30 days as of today.  Social history: Married.  Does white-collar work.  Seems to have a solid relationship at home.  Patient mentions that many of these symptoms started shortly after he had a "salvation experience" with profound religious character.  Many of the intrusive thoughts had religious characteristics to them which at first had seemed positive but later became more sinister.  Medical history: Patient has type 1 diabetes.  He has a insulin pump.  His blood sugars continue to have quite a bit of lability to them although he says his A1c's have been in the range of 6 most recently.  Patient has had an MRI scan done a couple years ago and shows me a readout of the full report.  Essentially it describes extensive small vessel disease and small lacunar with a excessive amount of frontal lobe encephalomalacia.  He has had head CTs in the last several months which do not show any new or specific findings.  Substance abuse history: None  Past Psychiatric History: Patient had  a hospitalization during the course of this illness at [facility].  Reading through the notes it sounds like at that time he was having genuinely psychotic-like reactions to his intrusive thoughts.  He was diagnosed at that time with major depression with psychotic features.  He is very clear however that he has never had any suicidal thought.  Risk to  Self: Is patient at risk for suicide?: No Risk to Others:   Prior Inpatient Therapy:   Prior Outpatient Therapy:    Past Medical History:  Past Medical History:  Diagnosis Date  . Hypercholesteremia   . Hypertension   . Type 1 diabetes [facility])     Past Surgical History:  Procedure Laterality Date  . CAROTID ARTERY ANGIOPLASTY     Family History: History reviewed. No pertinent family history. Family Psychiatric  History: He had a father and an uncle who both suffered from Parkinson's disease until death.  His mother is reported to have some kind of anxiety symptoms somewhat vaguely described. Social History:  Social History   Substance and Sexual Activity  Alcohol Use Yes   Comment: occasional     Social History   Substance and Sexual Activity  Drug Use No    Social History   Socioeconomic History  . Marital status: Married    Spouse name: None  . Number of children: None  . Years of education: None  . Highest education level: None  Social Needs  . Financial resource strain: None  . Food insecurity - worry: None  . Food insecurity - inability: None  . Transportation needs - medical: None  . Transportation needs - non-medical: None  Occupational History  . None  Tobacco Use  . Smoking status: Former Research scientist (life sciences)  . Smokeless tobacco: Never Used  Substance and Sexual Activity  . Alcohol use: Yes    Comment: occasional  . Drug use: No  . Sexual activity: None  Other Topics Concern  . None  Social History Narrative  . None   Additional Social History:    Allergies:  No Known Allergies  Labs:  Results for orders placed or performed during the hospital encounter of 09/23/17 (from the past 48 hour(s))  Acetaminophen level     Status: Abnormal   Collection Time: 09/23/17  1:56 PM  Result Value Ref Range   Acetaminophen (Tylenol), Serum <10 (L) 10 - 30 ug/mL    Comment:        THERAPEUTIC CONCENTRATIONS VARY SIGNIFICANTLY. A RANGE OF 10-30 ug/mL MAY BE AN  EFFECTIVE CONCENTRATION FOR MANY PATIENTS. HOWEVER, SOME ARE BEST TREATED AT CONCENTRATIONS OUTSIDE THIS RANGE. ACETAMINOPHEN CONCENTRATIONS >150 ug/mL AT 4 HOURS AFTER INGESTION AND >50 ug/mL AT 12 HOURS AFTER INGESTION ARE OFTEN ASSOCIATED WITH TOXIC REACTIONS. Performed at [facility], [address] [place], [place] [address]   Comprehensive metabolic panel     Status: Abnormal   Collection Time: 09/23/17  1:56 PM  Result Value Ref Range   Sodium 136 135 - 145 mmol/L   Potassium 4.1 3.5 - 5.1 mmol/L   Chloride 103 101 - 111 mmol/L   CO2 26 22 - 32 mmol/L   Glucose, Bld 289 (H) 65 - 99 mg/dL   BUN 19 6 - 20 mg/dL   Creatinine, Ser 0.98 0.61 - 1.24 mg/dL   Calcium 9.2 8.9 - 10.3 mg/dL   Total Protein 7.1 6.5 - 8.1 g/dL   Albumin 4.2 3.5 - 5.0 g/dL   AST 23 15 - 41  U/L   ALT 21 17 - 63 U/L   Alkaline Phosphatase 103 38 - 126 U/L   Total Bilirubin 0.6 0.3 - 1.2 mg/dL   GFR calc non Af Amer >60 >60 mL/min   GFR calc Af Amer >60 >60 mL/min    Comment: (NOTE) The eGFR has been calculated using the CKD EPI equation. This calculation has not been validated in all clinical situations. eGFR's persistently <60 mL/min signify possible Chronic Kidney Disease.    Anion gap 7 5 - 15    Comment: Performed at [facility], [address] [place], [place] [address]  Ethanol     Status: None   Collection Time: 09/23/17  1:56 PM  Result Value Ref Range   Alcohol, Ethyl (B) <10 <10 mg/dL    Comment:        LOWEST DETECTABLE LIMIT FOR SERUM ALCOHOL IS 10 mg/dL FOR MEDICAL PURPOSES ONLY Performed at [facility], [address] [place], [place] [address]   Salicylate level     Status: None   Collection Time: 09/23/17  1:56 PM  Result Value Ref Range   Salicylate Lvl <9.9 2.8 - 30.0 mg/dL    Comment: Performed at [facility], [address] [place], [place] [address]  CBC with Differential     Status: None   Collection Time: 09/23/17   1:56 PM  Result Value Ref Range   WBC 7.0 3.8 - 10.6 K/uL   RBC 4.82 4.40 - 5.90 MIL/uL   Hemoglobin 15.0 13.0 - 18.0 g/dL   HCT 45.2 40.0 - 52.0 %   MCV 93.9 80.0 - 100.0 fL   MCH 31.1 26.0 - 34.0 pg   MCHC 33.2 32.0 - 36.0 g/dL   RDW 14.4 11.5 - 14.5 %   Platelets 210 150 - 440 K/uL   Neutrophils Relative % 70 %   Neutro Abs 4.9 1.4 - 6.5 K/uL   Lymphocytes Relative 20 %   Lymphs Abs 1.4 1.0 - 3.6 K/uL   Monocytes Relative 8 %   Monocytes Absolute 0.5 0.2 - 1.0 K/uL   Eosinophils Relative 1 %   Eosinophils Absolute 0.1 0 - 0.7 K/uL   Basophils Relative 1 %   Basophils Absolute 0.1 0 - 0.1 K/uL    Comment: Performed at [facility], [address] [place], [place] [address]  Urinalysis, Complete w Microscopic     Status: Abnormal   Collection Time: 09/23/17  4:14 PM  Result Value Ref Range   Color, Urine YELLOW (A) YELLOW   APPearance CLEAR (A) CLEAR   Specific Gravity, Urine 1.018 1.005 - 1.030   pH 6.0 5.0 - 8.0   Glucose, UA 150 (A) NEGATIVE mg/dL   Hgb urine dipstick NEGATIVE NEGATIVE   Bilirubin Urine NEGATIVE NEGATIVE   Ketones, ur NEGATIVE NEGATIVE mg/dL   Protein, ur NEGATIVE NEGATIVE mg/dL   Nitrite NEGATIVE NEGATIVE   Leukocytes, UA NEGATIVE NEGATIVE   RBC / HPF 0-5 0 - 5 RBC/hpf   WBC, UA 0-5 0 - 5 WBC/hpf   Bacteria, UA NONE SEEN NONE SEEN   Squamous Epithelial / LPF NONE SEEN NONE SEEN    Comment: Performed at [facility], [address], [place], [place] [address]  Urine Drug Screen, Qualitative     Status: None   Collection Time: 09/23/17  4:14 PM  Result Value Ref Range   Tricyclic, Ur Screen NONE DETECTED NONE DETECTED   Amphetamines, Ur Screen NONE DETECTED NONE DETECTED  MDMA (Ecstasy)Ur Screen NONE DETECTED NONE DETECTED   Cocaine Metabolite,Ur [place] NONE DETECTED NONE DETECTED   Opiate, Ur Screen NONE DETECTED NONE DETECTED   Phencyclidine (PCP) Ur S NONE DETECTED NONE DETECTED   Cannabinoid 50 Ng, Ur [place] NONE DETECTED NONE  DETECTED   Barbiturates, Ur Screen NONE DETECTED NONE DETECTED   Benzodiazepine, Ur Scrn NONE DETECTED NONE DETECTED   Methadone Scn, Ur NONE DETECTED NONE DETECTED    Comment: (NOTE) Tricyclics + metabolites, urine    Cutoff 1000 ng/mL Amphetamines + metabolites, urine  Cutoff 1000 ng/mL MDMA (Ecstasy), urine              Cutoff 500 ng/mL Cocaine Metabolite, urine          Cutoff 300 ng/mL Opiate + metabolites, urine        Cutoff 300 ng/mL Phencyclidine (PCP), urine         Cutoff 25 ng/mL Cannabinoid, urine                 Cutoff 50 ng/mL Barbiturates + metabolites, urine  Cutoff 200 ng/mL Benzodiazepine, urine              Cutoff 200 ng/mL Methadone, urine                   Cutoff 300 ng/mL The urine drug screen provides only a preliminary, unconfirmed analytical test result and should not be used for non-medical purposes. Clinical consideration and professional judgment should be applied to any positive drug screen result due to possible interfering substances. A more specific alternate chemical method must be used in order to obtain a confirmed analytical result. Gas chromatography / mass spectrometry (GC/MS) is the preferred confirmat ory method. Performed at [facility], [address] [place], [place] [address]     No current facility-administered medications for this encounter.    Current Outpatient Medications  Medication Sig Dispense Refill  . atorvastatin (LIPITOR) 20 MG tablet Take 20 mg by mouth every evening.     . clonazePAM (KLONOPIN) 0.5 MG tablet Take 0.5 mg by mouth 2 (two) times daily.    . clopidogrel (PLAVIX) 75 MG tablet Take 75 mg by mouth every evening.    . gabapentin (NEURONTIN) 300 MG capsule Take 300 mg by mouth at bedtime.    . hydrOXYzine (ATARAX/VISTARIL) 25 MG tablet Take 1 tablet (25 mg total) by mouth every 6 (six) hours as needed for anxiety. 30 tablet 0  . insulin glargine (LANTUS) 100 UNIT/ML injection Inject 28 Units into the  skin daily.    [place] lisinopril (PRINIVIL,ZESTRIL) 5 MG tablet Take 1 tablet (5 mg total) by mouth every evening. 30 tablet 0  . Multiple Vitamin (MULTIVITAMIN WITH MINERALS) TABS tablet Take 1 tablet by mouth daily.    [place] omeprazole (PRILOSEC) 40 MG capsule Take 40 mg by mouth daily.    . QUEtiapine (SEROQUEL) 50 MG tablet Take 1 tablet (50 mg total) by mouth at bedtime. 30 tablet 0  . timolol (TIMOPTIC) 0.5 % ophthalmic solution Place 1 drop into both eyes 2 (two) times daily.      Musculoskeletal: Strength & Muscle Tone: within normal limits Gait & Station: normal Patient leans: N/A  Psychiatric Specialty Exam: Physical Exam  Nursing note and vitals reviewed. Constitutional: He appears well-developed and well-nourished.  HENT:  Head: Normocephalic and atraumatic.  Eyes: Conjunctivae are normal. Pupils are equal, round, and reactive to light.  Neck: Normal range of motion.  Cardiovascular: Regular rhythm  and normal heart sounds.  Respiratory: Effort normal.  GI: Soft.  Musculoskeletal: Normal range of motion.  Neurological: He is alert.  Patient has multiple facial tics which he can control with effort but are more obvious when he is speaking.  Skin: Skin is warm and dry.    Review of Systems  Constitutional: Negative.   HENT: Negative.   Eyes: Negative.   Respiratory: Negative.   Cardiovascular: Negative.   Gastrointestinal: Negative.   Musculoskeletal: Negative.   Skin: Negative.   Neurological: Negative.   Psychiatric/Behavioral: Negative for depression, hallucinations, memory loss, substance abuse and suicidal ideas. The patient is not nervous/anxious and does not have insomnia.     Blood pressure (!) 161/85, pulse (!) 111, resp. rate 18, height 5' 8"  (1.727 m), weight 68 kg (150 lb), SpO2 100 %.Body mass index is 22.81 kg/m.  General Appearance: Fairly Groomed  Eye Contact:  Good  Speech:  Clear and Coherent  Volume:  Normal  Mood:  Euthymic  Affect:  Appropriate   Thought Process:  Coherent and Goal Directed  Orientation:  Full (Time, Place, and Person)  Thought Content:  Logical  Suicidal Thoughts:  No  Homicidal Thoughts:  No  Memory:  Immediate;   Good Recent;   Fair Remote;   Fair  Judgement:  Fair  Insight:  Fair  Psychomotor Activity:  Normal and See the note above describing his multiple facial tics.  Patient has tics in his face that are much more obvious while he is talking but are present even at rest.  He reports to me that he has had these lifelong although they have been worse for several months now.  Concentration:  Concentration: Fair  Recall:  [employer] of Knowledge:  Fair  Language:  Fair  Akathisia:  No  Handed:  Right  AIMS (if indicated):     Assets:  Communication Skills Desire for Improvement Financial Resources/Insurance Housing Resilience Social Support  ADL's:  Intact  Cognition:  WNL  Sleep:        Treatment Plan Summary: Plan 62 year old man who has an interesting combination of symptoms and history.  I think the diagnosis is still a little unclear.  I compared my examination with that done by Dr. Nicolasa Ducking, and it sounds like she got a much more worrisome picture of his symptoms whereas with me he seemed to be emphasizing that everything was much better in the last month.  In any case there does not seem to be any indication for hospitalization or commitment or any sign of dangerousness.  I think the pattern fits most clearly N an anxiety disorder or in an obsessive-compulsive disorder type condition with possibly OCD and panic-like elements which could go along with a history of lifelong tic disorder.  He was referred to the emergency room for MRI out of concern for diagnosing a neurologic condition.  I think it is likely that the encephalomalacia and small vessel disease has worsened his symptoms and I would not rule out frontal lobe or other dementia.  Patient is instructed to continue current medicines as  prescribed by his doctors and follow-up with outpatient providers including Dr. Nicolasa Ducking.  Disposition: No evidence of imminent risk to self or others at present.   Patient does not meet criteria for psychiatric inpatient admission. Supportive therapy provided about ongoing stressors.  Alethia Berthold, MD 09/23/2017 5:27 PM

## 2017-09-23 NOTE — ED Notes (Signed)
Report from MRI

## 2017-09-23 NOTE — ED Triage Notes (Signed)
Pt came over from [facility] with psychiatrist. Pt reports had MRI about 3 years and they told him his entire frontal lobe was "dark" Pt reports would like another scan. Pt reports feels like something else is inside of him, reports not hearing voices but something has been off for past year. Denies si/hi. Has been admitted psych before. However, requesting not to be dressed out. Pt reports he "Would just like a brain scan."

## 2017-09-23 NOTE — ED Notes (Signed)
PT  VOL  SEEN  BY  DR  CLAPACS

## 2017-09-23 NOTE — ED Provider Notes (Signed)
[facility] Emergency Department Provider Note   ____________________________________________   First MD Initiated Contact with Patient 09/23/17 1354     (approximate)  I have reviewed the triage vital signs and the nursing notes.   HISTORY  Chief Complaint Ct/psych eval   HPI JAEGAR CROFT is a 62 y.o. male Patient has been having increasing twitching facial actually throughout his body in the last few weeks and months. He is also been having thoughts or hearing voices which are scaring him. He has had hallucinations in the past as I understand never had to take anything for them also had this twitching in the past but has been very mild. In fact it is been present since high school but again very mild here lately. Patient is not having any other symptoms of weakness or numbness visual problems etc. Patient has diabetes and has passed out from hypoglycemia in the past. Patient has a history of abnormal MRI of the brain 3 years ago which was apparently not followed up. I cannot find this but have not yet been able to find this in the computer.  Past Medical History:  Diagnosis Date  . Hypercholesteremia   . Hypertension   . Type 1 diabetes [facility])     Patient Active Problem List   Diagnosis Date Noted  . MDD (major depressive disorder), single episode, severe with psychotic features ([place]) 03/01/2017  . Adjustment disorder with mixed anxiety and depressed mood 02/28/2017  . Psychosis, affective ([place]) 02/28/2017  . Diabetic retinopathy ([place]) 10/03/2012  . GERD (gastroesophageal reflux disease) 04/04/2012  . Diabetic neuropathy ([place]) 04/04/2012  . DM (diabetes mellitus) type I, controlled, with peripheral vascular disorder ([place]) 02/04/2009  . 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 CAROTID ARTERY ANGIOPLASTY      Prior to Admission medications     Medication Sig Start Date End Date Taking? Authorizing Provider  atorvastatin (LIPITOR) 20 MG tablet Take 20 mg by mouth every evening.     [provider]  clonazePAM (KLONOPIN) 0.5 MG tablet Take 0.5 mg by mouth 2 (two) times daily.    [provider]  clopidogrel (PLAVIX) 75 MG tablet Take 75 mg by mouth every evening.    [provider]  gabapentin (NEURONTIN) 300 MG capsule Take 300 mg by mouth at bedtime.    [provider]  hydrOXYzine (ATARAX/VISTARIL) 25 MG tablet Take 1 tablet (25 mg total) by mouth every 6 (six) hours as needed for anxiety. 03/07/17   Derrill Center, NP  insulin glargine (LANTUS) 100 UNIT/ML injection Inject 28 Units into the skin daily.    [provider]  lisinopril (PRINIVIL,ZESTRIL) 5 MG tablet Take 1 tablet (5 mg total) by mouth every evening. 03/07/17   Derrill Center, NP  Multiple Vitamin (MULTIVITAMIN WITH MINERALS) TABS tablet Take 1 tablet by mouth daily.    [provider]  omeprazole (PRILOSEC) 40 MG capsule Take 40 mg by mouth daily.    [provider]  QUEtiapine (SEROQUEL) 50 MG tablet Take 1 tablet (50 mg total) by mouth at bedtime. 03/07/17   Derrill Center, NP  timolol (TIMOPTIC) 0.5 % ophthalmic solution Place 1 drop into both eyes 2 (two) times daily.    [provider]    Allergies Patient has no known allergies.  History reviewed. No pertinent family history.  Social History Social History   Tobacco  Use  . Smoking status: Former Smoker  . Smokeless tobacco: Never Used  Substance Use Topics  . Alcohol use: Yes    Comment: occasional  . Drug use: No    Review of Systems  Constitutional: No fever/chills Eyes: No visual changes. ENT: No sore throat. Cardiovascular: Denies chest pain. Respiratory: Denies shortness of breath. Gastrointestinal: No abdominal pain.  No nausea, no vomiting.  No diarrhea.  No constipation. Genitourinary: Negative for  dysuria. Musculoskeletal: Negative for back pain. Skin: Negative for rash. Neurological: Negative for headaches, focal weakness or numbness.  ____________________________________________   PHYSICAL EXAM:  VITAL SIGNS: ED Triage Vitals [09/23/17 1404]  Enc Vitals Group     BP (!) 161/85     Pulse Rate (!) 111     Resp 18     Temp      Temp src      SpO2 100 %     Weight      Height      Head Circumference      Peak Flow      Pain Score      Pain Loc      Pain Edu?      Excl. in [place]?     Constitutional: Alert and oriented. Well appearing and in no acute distress. Eyes: Conjunctivae are normal. PER. EOMI. Head: Atraumatic. Nose: No congestion/rhinnorhea. Mouth/Throat: Mucous membranes are moist.  Oropharynx non-erythematous. Neck: No stridor.  Cardiovascular: Normal rate, regular rhythm. Grossly normal heart sounds.  Good peripheral circulation. Respiratory: Normal respiratory effort.  No retractions. Lungs CTAB. Gastrointestinal: Soft and nontender. No distention. No abdominal bruits. No CVA tenderness. Musculoskeletal: No lower extremity tenderness nor edema.  No joint effusions. Neurologic:  Normal speech and language. No gross focal neurologic deficits are appreciated. specifically cranial nerves II through XII are intact. Visual fields were not checked cerebellar finger-nose rapid alternating movements in the hand are normal motor strength is 5 over 5 throughout there is no sensory changes I can find. Patient does have occasional twitches in the face and hands and legs. Skin:  Skin is warm, dry and intact. No rash noted. Psychiatric: Mood and affect are normal. Speech and behavior are normal.  ____________________________________________   LABS (all labs ordered are listed, but only abnormal results are displayed)  Labs Reviewed  ACETAMINOPHEN LEVEL - Abnormal; Notable for the following components:      Result Value   Acetaminophen (Tylenol), Serum <10 (*)    All  other components within normal limits  COMPREHENSIVE METABOLIC PANEL - Abnormal; Notable for the following components:   Glucose, Bld 289 (*)    All other components within normal limits  ETHANOL  SALICYLATE LEVEL  CBC WITH DIFFERENTIAL/PLATELET  URINALYSIS, COMPLETE (UACMP) WITH MICROSCOPIC  URINE DRUG SCREEN, QUALITATIVE ([facility] ONLY)   ____________________________________________  EKG   ____________________________________________  RADIOLOGY  ED MD interpretation:    Official radiology report(s): No results found.  ____________________________________________   PROCEDURES  Procedure(s) performed:   Procedures  Critical Care performed:   ____________________________________________   INITIAL IMPRESSION / ASSESSMENT AND PLAN / ED COURSE  patient pending MRI, lab work is returned normal. Signed out to Dr. Kerman Passey         ____________________________________________   FINAL CLINICAL IMPRESSION(S) / ED DIAGNOSES  Final diagnoses:  Hearing voices     ED Discharge Orders    None       Note:  This document was prepared using Dragon voice recognition software and may include unintentional dictation  errors.    Nena Polio, MD 09/23/17 [phone]

## 2017-09-26 ENCOUNTER — Other Ambulatory Visit: Payer: Self-pay | Admitting: Family Medicine

## 2017-09-26 ENCOUNTER — Other Ambulatory Visit (HOSPITAL_COMMUNITY): Payer: Self-pay | Admitting: Family Medicine

## 2017-09-26 DIAGNOSIS — R42 Dizziness and giddiness: Secondary | ICD-10-CM

## 2017-11-29 ENCOUNTER — Other Ambulatory Visit: Payer: Self-pay | Admitting: Neurology

## 2017-11-29 DIAGNOSIS — G0490 Encephalitis and encephalomyelitis, unspecified: Secondary | ICD-10-CM

## 2017-12-02 ENCOUNTER — Ambulatory Visit: Admission: RE | Admit: 2017-12-02 | Payer: Managed Care, Other (non HMO) | Source: Ambulatory Visit

## 2017-12-16 ENCOUNTER — Ambulatory Visit
Admission: RE | Admit: 2017-12-16 | Discharge: 2017-12-16 | Disposition: A | Discharge status: Home / Self Care | Payer: Managed Care, Other (non HMO) | Source: Ambulatory Visit | Attending: Family Medicine | Admitting: Family Medicine

## 2017-12-16 DIAGNOSIS — R42 Dizziness and giddiness: Secondary | ICD-10-CM | POA: Insufficient documentation

## 2017-12-16 DIAGNOSIS — I6523 Occlusion and stenosis of bilateral carotid arteries: Secondary | ICD-10-CM | POA: Insufficient documentation

## 2017-12-30 ENCOUNTER — Ambulatory Visit
Admission: RE | Admit: 2017-12-30 | Discharge: 2017-12-30 | Disposition: A | Discharge status: Home / Self Care | Payer: Managed Care, Other (non HMO) | Source: Ambulatory Visit | Attending: Neurology | Admitting: Neurology

## 2017-12-30 DIAGNOSIS — G0490 Encephalitis and encephalomyelitis, unspecified: Secondary | ICD-10-CM | POA: Diagnosis present

## 2017-12-30 HISTORY — DX: Anxiety disorder, unspecified: F41.9

## 2017-12-30 LAB — CSF CELL COUNT WITH DIFFERENTIAL
Eosinophils, CSF: 0 %
Lymphs, CSF: 0 %
Monocyte-Macrophage-Spinal Fluid: 0 %
OTHER CELLS CSF: 0
RBC COUNT CSF: 40 /mm3 — AB (ref 0–3)
Segmented Neutrophils-CSF: 0 %
TUBE #: 3
WBC CSF: 0 /mm3 (ref 0–5)

## 2017-12-30 LAB — GLUCOSE, RANDOM: Glucose, Bld: 68 mg/dL (ref 65–99)

## 2017-12-30 LAB — CBC
HCT: 50 % (ref 40.0–52.0)
Hemoglobin: 16.7 g/dL (ref 13.0–18.0)
MCH: 31.2 pg (ref 26.0–34.0)
MCHC: 33.5 g/dL (ref 32.0–36.0)
MCV: 93.3 fL (ref 80.0–100.0)
PLATELETS: 216 10*3/uL (ref 150–440)
RBC: 5.36 MIL/uL (ref 4.40–5.90)
RDW: 13.8 % (ref 11.5–14.5)
WBC: 6.8 10*3/uL (ref 3.8–10.6)

## 2017-12-30 LAB — PROTEIN, CSF: Total  Protein, CSF: 61 mg/dL — ABNORMAL HIGH (ref 15–45)

## 2017-12-30 LAB — PROTIME-INR
INR: 0.92
PROTHROMBIN TIME: 12.3 s (ref 11.4–15.2)

## 2017-12-30 LAB — GLUCOSE, CSF: Glucose, CSF: 92 mg/dL — ABNORMAL HIGH (ref 40–70)

## 2017-12-30 LAB — APTT: aPTT: 28 seconds (ref 24–36)

## 2017-12-30 MED ORDER — LIDOCAINE HCL (PF) 1 % IJ SOLN
5.0000 mL | Freq: Once | INTRAMUSCULAR | Status: DC
  Filled 2017-12-30: qty 5

## 2017-12-30 NOTE — Discharge Instructions (Signed)
Lumbar Puncture  A lumbar puncture, or spinal tap, is a procedure in which a small amount of the fluid that surrounds the brain and spinal cord is removed and examined. The fluid is called the cerebrospinal fluid. This procedure may be done to:   Help diagnose various problems, such as meningitis, encephalitis, multiple sclerosis, and AIDS.   Remove fluid and relieve pressure that occurs with certain types of headaches.   Look for bleeding within the brain and spinal cord areas (central nervous system).   Place medicine into the spinal fluid.    Tell a health care provider about:   Any allergies you have.   All medicines you are taking, including vitamins, herbs, eye drops, creams, and over-the-counter medicines.   Any problems you or family members have had with anesthetic medicines.   Any blood disorders you have.   Any surgeries you have had.   Any medical conditions you have.  What are the risks?  Generally, this is a safe procedure. However, as with any procedure, complications can occur. Possible complications include:   Spinal headache. This is a severe headache that occurs when there is a leak of spinal fluid. A spinal headache causes discomfort but is not dangerous. If it persists, another procedure may be done to treat the headache.   Bleeding. This most often occurs in people with bleeding disorders. These are disorders in which the blood does not clot normally.   Infection at the insertion site that can spread to the bone or spinal fluid.   Formation of a spinal cord tumor (rare).   Brain herniation or movement of the brain into the spinal cord (rare).   Inability to move (extremely rare).    What happens before the procedure?   You may have blood tests done. These tests can help tell how well your kidneys and liver are working. They can also show how well your blood clots.   If you take blood thinners (anticoagulant medicine), ask your health care provider if and when you should stop  taking them.   Your health care provider may order a CT scan of your brain.   Make arrangements for someone to drive you home after the procedure.  What happens during the procedure?   You will be positioned so that the spaces between the bones of the spine (vertebrae) are as wide as possible. This will make it easier to pass the needle into the spinal canal.   Depending on your age and size, you may lie on your side, curled up with your knees under your chin. Or, you may sit with your head resting on a pillow that is placed at waist level.   The skin covering the lower back (or lumbar region) will be cleaned.   The skin may be numbed with medicine.   You may be given pain medicine or a medicine to help you relax (sedative).   A small needle will be inserted in the skin until it enters the space that contains the spinal fluid. The needle will not enter the spinal cord.   The spinal fluid will be collected into tubes.   The needle will be withdrawn, and a bandage will be placed on the site.  What happens after the procedure?   You will remain lying down for 1 hour or for as long as your health care provider suggests.   The spinal fluid will be sent to a laboratory to be examined. The results of the examination may   be available before you go home.   A test, called a culture, may be taken of the spinal fluid if your health care provider thinks you have an infection. If cultures were taken for exam, the results will usually be available in a couple of days.  This information is not intended to replace advice given to you by your health care provider. Make sure you discuss any questions you have with your health care provider.  Document Released: 07/30/2000 Document Revised: 01/08/2016 Document Reviewed: 04/09/2013  Elsevier Interactive Patient Education  2017 [employer]

## 2017-12-30 NOTE — Progress Notes (Signed)
Pt. Stable after l.p.VSS.Back stable.D/C instructions given.F/U with his M.D.

## 2018-01-02 LAB — IGG CSF INDEX
ALBUMIN: 4.8 g/dL (ref 3.6–4.8)
Albumin CSF-mCnc: 40 mg/dL (ref 11–48)
IGG (IMMUNOGLOBIN G), SERUM: 906 mg/dL (ref 700–1600)
IGG INDEX CSF: 0.5 (ref 0.0–0.7)
IgG, CSF: 3.4 mg/dL (ref 0.0–8.6)
IgG/Alb Ratio, CSF: 0.09 (ref 0.00–0.25)

## 2018-01-04 LAB — OLIGOCLONAL BANDS, CSF + SERM

## 2018-05-10 ENCOUNTER — Other Ambulatory Visit: Payer: Self-pay | Admitting: Family Medicine

## 2018-05-10 DIAGNOSIS — R1310 Dysphagia, unspecified: Secondary | ICD-10-CM

## 2018-06-02 ENCOUNTER — Ambulatory Visit
Admission: RE | Admit: 2018-06-02 | Discharge: 2018-06-02 | Disposition: A | Discharge status: Home / Self Care | Payer: Managed Care, Other (non HMO) | Source: Ambulatory Visit | Attending: Family Medicine | Admitting: Family Medicine

## 2018-06-02 DIAGNOSIS — R1313 Dysphagia, pharyngeal phase: Secondary | ICD-10-CM | POA: Diagnosis present

## 2018-06-02 NOTE — Therapy (Signed)
[place], [place], [address] Phone: [phone]   Fax:     Modified Barium Swallow  Patient Details  Name: Edgar Huynh MRN: [id number] Date of Birth: 1956-07-21 No data recorded  Encounter Date: 06/02/2018  End of Session - 06/02/18 1313    Visit Number  1    Number of Visits  1    Date for SLP Re-Evaluation  06/02/18    SLP Start Time  8    SLP Stop Time   1313    SLP Time Calculation (min)  43 min    Activity Tolerance  Patient tolerated treatment well       Past Medical History:  Diagnosis Date  . Anxiety   . Hypercholesteremia   . Hypertension   . Type 1 diabetes [facility])     Past Surgical History:  Procedure Laterality Date  . CAROTID ARTERY ANGIOPLASTY      There were no vitals filed for this visit.   Subjective: Patient behavior: (alertness, ability to follow instructions, etc.):  The patient is alert, able to verbalize his complaints, and follow directions.  Chief complaint: aspirate saliva and liquid; paroxymal coughing at times   Objective:  Radiological Procedure: A videoflouroscopic evaluation of oral-preparatory, reflex initiation, and pharyngeal phases of the swallow was performed; as well as a screening of the upper esophageal phase.  I. POSTURE: Upright in MBS chair and standing  II. VIEW: Lateral and A-P  III. COMPENSATORY STRATEGIES: small sips- no penetration/aspiration; chin tuck- appears to prevent aspiration   IV. BOLUSES ADMINISTERED:   Thin Liquid: 2 small, 8 rapid consecutive, 4 straw   Nectar-thick Liquid: 3 moderate   Honey-thick Liquid: DNT   Puree: 3 teaspoon presentations   Mechanical Soft: 1/4 graham cracker in applesauce  V. RESULTS OF EVALUATION: A. ORAL PREPARATORY PHASE: (The lips, tongue, and velum are observed for strength and coordination)       **Overall Severity Rating: within normal limits  B. SWALLOW INITIATION/REFLEX:  (The reflex is normal if "triggered" by the time the bolus reached the base of the tongue)  **Overall Severity Rating: Mild; triggers while falling from the valleculae to the pyriform sinuses  C. PHARYNGEAL PHASE: (Pharyngeal function is normal if the bolus shows rapid, smooth, and continuous transit through the pharynx and there is no pharyngeal residue after the swallow)  **Overall Severity Rating: Mild; reduced pharyngeal pressure generation, reduced tongue base retraction, reduced laryngeal vestibule closure at the height of the swallow, mild pharyngeal residue  D. LARYNGEAL PENETRATION: (Material entering into the laryngeal inlet/vestibule but not aspirated) Transient laryngeal penetration with nectar-thick and thin liquids  E. ASPIRATION: audible aspiration with large gulp of nectar-thick liquid, cough effective  F. ESOPHAGEAL PHASE: (Screening of the upper esophagus) An esophageal sweep in the upright position with liquid and solid consistencies showed distal-esophageal retention with retrograde flow below the level of the pharyngoesophageal segment.     ASSESSMENT: This 62 year old man; with remote history of brain injury and complaint of aspirating saliva and liquids; is presenting with mild pharyngeal dysphagia characterized by delayed pharyngeal swallow initiation, reduced pharyngeal pressure generation, mild pharyngeal residue, transient laryngeal penetration (nectar-thick and thin liquids), and aspiration X1.  Oral control of the bolus including oral hold, rotary mastication, and anterior to posterior transfer is within normal limits.  The patient demonstrates a strong effective cough response to aspiration and is at minimal risk for negative sequelae to aspiration.  The patient  appears to improve airway protection with small sips and use of chin tuck posture.  Pharyngeal swallow delay and reduce pharyngeal pressure generation are consistent with effects of laryngopharyngeal reflux  (inflammation, edema, and resultant decreased sensation of the larynx and pharynx) as well as neuromusculature impairment.  An esophageal sweep in the upright position with liquid and solid consistencies showed distal-esophageal retention with retrograde flow below the level of the pharyngoesophageal segment.   The patient is complaining of weak voice and may benefit from referral to ENT.  PLAN/RECOMMENDATIONS:   A. Diet: Regular   B. Swallowing Precautions: Small sips, chin tuck with liquids   C. Recommended consultation to: ENT secondary complaint of "weak voice"   D. Therapy recommendations: ST is not indicated at this time.  The patient may benefit from voice therapy pending ENT consult   E. Results and recommendations were discussed with the patient immediately following the study and the final report routed to the referring practitioner.     Pharyngeal dysphagia - Plan: DG Swallowing Func-Speech Pathology, DG Swallowing Func-Speech Pathology        Problem List Patient Active Problem List   Diagnosis Date Noted  . Anxiety disorder 09/23/2017  . MDD (major depressive disorder), single episode, severe with psychotic features ([place]) 03/01/2017  . Adjustment disorder with mixed anxiety and depressed mood 02/28/2017  . Psychosis, affective ([place]) 02/28/2017  . Diabetic retinopathy ([place]) 10/03/2012  . GERD (gastroesophageal reflux disease) 04/04/2012  . Diabetic neuropathy ([place]) 04/04/2012  . DM (diabetes mellitus) type I, controlled, with peripheral vascular disorder ([place]) 02/04/2009  . 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

## 2018-08-24 ENCOUNTER — Encounter: Payer: Self-pay | Admitting: *Deleted

## 2018-08-25 ENCOUNTER — Encounter
Admission: RE | Disposition: A | Discharge status: Home / Self Care | Payer: Self-pay | Source: Home / Self Care | Attending: Unknown Physician Specialty

## 2018-08-25 ENCOUNTER — Ambulatory Visit
Admission: RE | Admit: 2018-08-25 | Discharge: 2018-08-25 | Disposition: A | Discharge status: Home / Self Care | Payer: Managed Care, Other (non HMO) | Attending: Unknown Physician Specialty | Admitting: Unknown Physician Specialty

## 2018-08-25 ENCOUNTER — Ambulatory Visit: Payer: Managed Care, Other (non HMO) | Admitting: Anesthesiology

## 2018-08-25 ENCOUNTER — Encounter: Payer: Self-pay | Admitting: *Deleted

## 2018-08-25 DIAGNOSIS — Z794 Long term (current) use of insulin: Secondary | ICD-10-CM | POA: Diagnosis not present

## 2018-08-25 DIAGNOSIS — F209 Schizophrenia, unspecified: Secondary | ICD-10-CM | POA: Insufficient documentation

## 2018-08-25 DIAGNOSIS — K210 Gastro-esophageal reflux disease with esophagitis: Secondary | ICD-10-CM | POA: Insufficient documentation

## 2018-08-25 DIAGNOSIS — Z7902 Long term (current) use of antithrombotics/antiplatelets: Secondary | ICD-10-CM | POA: Insufficient documentation

## 2018-08-25 DIAGNOSIS — K640 First degree hemorrhoids: Secondary | ICD-10-CM | POA: Diagnosis not present

## 2018-08-25 DIAGNOSIS — K319 Disease of stomach and duodenum, unspecified: Secondary | ICD-10-CM | POA: Insufficient documentation

## 2018-08-25 DIAGNOSIS — F329 Major depressive disorder, single episode, unspecified: Secondary | ICD-10-CM | POA: Diagnosis not present

## 2018-08-25 DIAGNOSIS — Z8673 Personal history of transient ischemic attack (TIA), and cerebral infarction without residual deficits: Secondary | ICD-10-CM | POA: Insufficient documentation

## 2018-08-25 DIAGNOSIS — Z1211 Encounter for screening for malignant neoplasm of colon: Secondary | ICD-10-CM | POA: Diagnosis not present

## 2018-08-25 DIAGNOSIS — K219 Gastro-esophageal reflux disease without esophagitis: Secondary | ICD-10-CM | POA: Diagnosis not present

## 2018-08-25 DIAGNOSIS — F419 Anxiety disorder, unspecified: Secondary | ICD-10-CM | POA: Insufficient documentation

## 2018-08-25 DIAGNOSIS — Z86010 Personal history of colonic polyps: Secondary | ICD-10-CM | POA: Diagnosis not present

## 2018-08-25 DIAGNOSIS — Z79899 Other long term (current) drug therapy: Secondary | ICD-10-CM | POA: Diagnosis not present

## 2018-08-25 DIAGNOSIS — E1040 Type 1 diabetes mellitus with diabetic neuropathy, unspecified: Secondary | ICD-10-CM | POA: Diagnosis not present

## 2018-08-25 DIAGNOSIS — I10 Essential (primary) hypertension: Secondary | ICD-10-CM | POA: Insufficient documentation

## 2018-08-25 DIAGNOSIS — R12 Heartburn: Secondary | ICD-10-CM | POA: Diagnosis present

## 2018-08-25 DIAGNOSIS — E7800 Pure hypercholesterolemia, unspecified: Secondary | ICD-10-CM | POA: Insufficient documentation

## 2018-08-25 HISTORY — DX: Polyneuropathy, unspecified: G62.9

## 2018-08-25 HISTORY — DX: Unspecified glaucoma: H40.9

## 2018-08-25 HISTORY — DX: Gastro-esophageal reflux disease without esophagitis: K21.9

## 2018-08-25 HISTORY — DX: Cerebral infarction, unspecified: I63.9

## 2018-08-25 HISTORY — DX: Major depressive disorder, single episode, unspecified: F32.9

## 2018-08-25 HISTORY — DX: Personal history of colonic polyps: Z86.010

## 2018-08-25 HISTORY — PX: ESOPHAGOGASTRODUODENOSCOPY (EGD) WITH PROPOFOL: SHX5813

## 2018-08-25 HISTORY — PX: COLONOSCOPY WITH PROPOFOL: SHX5780

## 2018-08-25 HISTORY — DX: Depression, unspecified: F32.A

## 2018-08-25 HISTORY — DX: Personal history of colon polyps, unspecified: Z86.0100

## 2018-08-25 LAB — GLUCOSE, CAPILLARY: Glucose-Capillary: 136 mg/dL — ABNORMAL HIGH (ref 70–99)

## 2018-08-25 SURGERY — ESOPHAGOGASTRODUODENOSCOPY (EGD) WITH PROPOFOL
Anesthesia: General

## 2018-08-25 MED ORDER — FENTANYL CITRATE (PF) 100 MCG/2ML IJ SOLN
INTRAMUSCULAR | Status: AC
  Filled 2018-08-25: qty 2

## 2018-08-25 MED ORDER — EPHEDRINE SULFATE 50 MG/ML IJ SOLN
INTRAMUSCULAR | Status: AC
  Filled 2018-08-25: qty 1

## 2018-08-25 MED ORDER — PROPOFOL 10 MG/ML IV BOLUS
INTRAVENOUS | Status: DC | PRN
  Administered 2018-08-25: 40 mg via INTRAVENOUS

## 2018-08-25 MED ORDER — LIDOCAINE HCL (CARDIAC) PF 100 MG/5ML IV SOSY
PREFILLED_SYRINGE | INTRAVENOUS | Status: DC | PRN
  Administered 2018-08-25: 100 mg via INTRAVENOUS

## 2018-08-25 MED ORDER — PROPOFOL 500 MG/50ML IV EMUL
INTRAVENOUS | Status: DC | PRN
  Administered 2018-08-25: 140 ug/kg/min via INTRAVENOUS

## 2018-08-25 MED ORDER — MIDAZOLAM HCL 2 MG/2ML IJ SOLN
INTRAMUSCULAR | Status: DC | PRN
  Administered 2018-08-25: 1 mg via INTRAVENOUS

## 2018-08-25 MED ORDER — LIDOCAINE HCL (PF) 2 % IJ SOLN
INTRAMUSCULAR | Status: AC
  Filled 2018-08-25: qty 10

## 2018-08-25 MED ORDER — PROPOFOL 500 MG/50ML IV EMUL
INTRAVENOUS | Status: AC
  Filled 2018-08-25: qty 50

## 2018-08-25 MED ORDER — MIDAZOLAM HCL 2 MG/2ML IJ SOLN
INTRAMUSCULAR | Status: AC
  Filled 2018-08-25: qty 2

## 2018-08-25 MED ORDER — SODIUM CHLORIDE 0.9 % IV SOLN
INTRAVENOUS | Status: DC
  Administered 2018-08-25: 1000 mL via INTRAVENOUS
  Administered 2018-08-25: 09:00:00 via INTRAVENOUS

## 2018-08-25 MED ORDER — FENTANYL CITRATE (PF) 100 MCG/2ML IJ SOLN
INTRAMUSCULAR | Status: DC | PRN
  Administered 2018-08-25: 50 ug via INTRAVENOUS

## 2018-08-25 MED ORDER — SODIUM CHLORIDE 0.9 % IV SOLN: INTRAVENOUS | Status: DC

## 2018-08-25 NOTE — Anesthesia Postprocedure Evaluation (Signed)
Anesthesia Post Note  Patient: Edgar Huynh  Procedure(s) Performed: ESOPHAGOGASTRODUODENOSCOPY (EGD) WITH PROPOFOL (N/A ) COLONOSCOPY WITH PROPOFOL (N/A )  Patient location during evaluation: Endoscopy Anesthesia Type: General Level of consciousness: awake and alert Pain management: pain level controlled Vital Signs Assessment: post-procedure vital signs reviewed and stable Respiratory status: spontaneous breathing and respiratory function stable Cardiovascular status: stable Anesthetic complications: no     Last Vitals:  Vitals:   08/25/18 0812 08/25/18 0923  BP: 126/71 117/74  Pulse: 82 97  Resp: 16 18  Temp: (!) 36.4 C (!) 36.2 C  SpO2: 97% 97%    Last Pain:  Vitals:   08/25/18 0923  TempSrc: Tympanic  PainSc: Asleep                 Arbell Wycoff K

## 2018-08-25 NOTE — Anesthesia Post-op Follow-up Note (Signed)
Anesthesia QCDR form completed.        

## 2018-08-25 NOTE — Anesthesia Preprocedure Evaluation (Signed)
Anesthesia Evaluation  Patient identified by MRN, date of birth, ID band Patient awake    Reviewed: Allergy & Precautions, NPO status , Patient's Chart, lab work & pertinent test results  History of Anesthesia Complications Negative for: history of anesthetic complications  Airway Mallampati: II       Dental   Pulmonary neg sleep apnea, neg COPD,           Cardiovascular hypertension, Pt. on medications (-) Past MI and (-) CHF (-) dysrhythmias (-) Valvular Problems/Murmurs     Neuro/Psych neg Seizures Anxiety Depression Schizophrenia Brain injury as a teenager TIA (L sided weakness)   GI/Hepatic Neg liver ROS, GERD  Medicated and Controlled,  Endo/Other  diabetes, Type 1, Insulin Dependent  Renal/GU negative Renal ROS     Musculoskeletal   Abdominal   Peds  Hematology   Anesthesia Other Findings   Reproductive/Obstetrics                             Anesthesia Physical Anesthesia Plan  ASA: III  Anesthesia Plan: General   Post-op Pain Management:    Induction: Intravenous  PONV Risk Score and Plan: 2 and Propofol infusion and TIVA  Airway Management Planned: Nasal Cannula  Additional Equipment:   Intra-op Plan:   Post-operative Plan:   Informed Consent: I have reviewed the patients History and Physical, chart, labs and discussed the procedure including the risks, benefits and alternatives for the proposed anesthesia with the patient or authorized representative who has indicated his/her understanding and acceptance.     Plan Discussed with:   Anesthesia Plan Comments:         Anesthesia Quick Evaluation

## 2018-08-25 NOTE — Op Note (Signed)
[facility] Gastroenterology Patient Name: Edgar Huynh Procedure Date: 08/25/2018 8:33 AM MRN: [id number] Account #: [id number] Date of Birth: 03/25/1956 Admit Type: Outpatient Age: 63 Room: [facility] ENDO ROOM 1 Gender: Male Note Status: Finalized Procedure:            Upper GI endoscopy Indications:          Heartburn, Suspected gastro-esophageal reflux disease Providers:            Manya Silvas, MD Referring MD:         No Local Md, MD (Referring MD) Medicines:            Propofol per Anesthesia Complications:        No immediate complications. Procedure:            Pre-Anesthesia Assessment:                       - After reviewing the risks and benefits, the patient                        was deemed in satisfactory condition to undergo the                        procedure.                       After obtaining informed consent, the endoscope was                        passed under direct vision. Throughout the procedure,                        the patient's blood pressure, pulse, and oxygen                        saturations were monitored continuously. The Endoscope                        was introduced through the mouth, and advanced to the                        second part of duodenum. The upper GI endoscopy was                        accomplished without difficulty. The patient tolerated                        the procedure well. Findings:      LA Grade A (one or more mucosal breaks less than 5 mm, not extending       between tops of 2 mucosal folds) esophagitis with no bleeding was found       36 cm from the incisors. Biopsies were taken with a cold forceps for       histology.      Striped mildly erythematous mucosa without bleeding was found in the       gastric antrum. Biopsies were taken with a cold forceps for histology.       Biopsies were taken with a cold forceps for Helicobacter pylori testing.      The examined duodenum was  normal. Impression:           - LA  Grade A reflux esophagitis. Rule out Barrett's                        esophagus. Biopsied.                       - Erythematous mucosa in the antrum. Biopsied.                       - Normal examined duodenum. Recommendation:       - Await pathology results. Manya Silvas, MD 08/25/2018 8:56:58 AM This report has been signed electronically. Number of Addenda: 0 Note Initiated On: 08/25/2018 8:33 AM      [facility]

## 2018-08-25 NOTE — Transfer of Care (Signed)
Immediate Anesthesia Transfer of Care Note  Patient: Edgar Huynh  Procedure(s) Performed: ESOPHAGOGASTRODUODENOSCOPY (EGD) WITH PROPOFOL (N/A ) COLONOSCOPY WITH PROPOFOL (N/A )  Patient Location: PACU  Anesthesia Type:General  Level of Consciousness: awake  Airway & Oxygen Therapy: Patient Spontanous Breathing and Patient connected to nasal cannula oxygen  Post-op Assessment: Report given to RN and Post -op Vital signs reviewed and stable  Post vital signs: Reviewed and stable  Last Vitals:  Vitals Value Taken Time  BP 117/74 08/25/2018  9:23 AM  Temp 36.2 C 08/25/2018  9:23 AM  Pulse 95 08/25/2018  9:24 AM  Resp 17 08/25/2018  9:24 AM  SpO2 96 % 08/25/2018  9:24 AM  Vitals shown include unvalidated device data.  Last Pain:  Vitals:   08/25/18 0923  TempSrc: Tympanic  PainSc: Asleep         Complications: No apparent anesthesia complications

## 2018-08-25 NOTE — H&P (Signed)
Primary Care Physician:  Donnamarie Rossetti, PA-C Primary Gastroenterologist:  Dr. Vira Agar  Pre-Procedure History & Physical: HPI:  Edgar Huynh is a 63 y.o. male is here for an endoscopy and colonoscopy.   Past Medical History:  Diagnosis Date  . Anxiety   . Depression   . GERD (gastroesophageal reflux disease)   . Glaucoma   . History of colonic polyps   . Hypercholesteremia   . Hypertension   . Neuropathy   . Stroke ([place])   . Type 1 diabetes [facility])     Past Surgical History:  Procedure Laterality Date  . CAROTID ARTERY ANGIOPLASTY    . COLONOSCOPY    . ESOPHAGOGASTRODUODENOSCOPY    . EYE SURGERY      Prior to Admission medications   Medication Sig Start Date End Date Taking? Authorizing Provider  Alpha-Lipoic Acid 200 MG CAPS Take 200 mg by mouth daily.   Yes [provider]  insulin aspart (NOVOLOG) 100 UNIT/ML injection Inject 70 Units into the skin continuous.   Yes [provider]  Omega-3 Fatty Acids (OMEGA-3 2100 PO) Take 1,000 mg by mouth daily.   Yes [provider]  ARIPiprazole (ABILIFY) 2 MG tablet Take 2 mg by mouth daily.    [provider]  atorvastatin (LIPITOR) 20 MG tablet Take 20 mg by mouth every evening.     [provider]  clonazePAM (KLONOPIN) 0.5 MG tablet Take 0.5 mg by mouth 2 (two) times daily.    [provider]  clopidogrel (PLAVIX) 75 MG tablet Take 75 mg by mouth every evening.    [provider]  escitalopram (LEXAPRO) 10 MG tablet Take 10 mg by mouth daily.    [provider]  gabapentin (NEURONTIN) 300 MG capsule Take 300 mg by mouth at bedtime.    [provider]  hydrOXYzine (ATARAX/VISTARIL) 25 MG tablet Take 1 tablet (25 mg total) by mouth every 6 (six) hours as needed for anxiety. 03/07/17   Derrill Center, NP  insulin glargine (LANTUS) 100 UNIT/ML injection Inject 28 Units into the skin daily.    [provider]  lisinopril  (PRINIVIL,ZESTRIL) 5 MG tablet Take 1 tablet (5 mg total) by mouth every evening. 03/07/17   Derrill Center, NP  Multiple Vitamin (MULTIVITAMIN WITH MINERALS) TABS tablet Take 1 tablet by mouth daily.    [provider]  omeprazole (PRILOSEC) 40 MG capsule Take 40 mg by mouth daily.    [provider]    Allergies as of 07/03/2018 - Review Complete 06/02/2018  Allergen Reaction Noted  . Clonidine derivatives Rash 12/30/2017    History reviewed. No pertinent family history.  Social History   Socioeconomic History  . Marital status: Married    Spouse name: Not on file  . Number of children: Not on file  . Years of education: Not on file  . Highest education level: Not on file  Occupational History  . Not on file  Social Needs  . Financial resource strain: Not on file  . Food insecurity:    Worry: Not on file    Inability: Not on file  . Transportation needs:    Medical: Not on file    Non-medical: Not on file  Tobacco Use  . Smoking status: Never Smoker  . Smokeless tobacco: Never Used  Substance and Sexual Activity  . Alcohol use: Never    Frequency: Never    Comment: occasional  . Drug use: Never  . Sexual  activity: Not on file  Lifestyle  . Physical activity:    Days per week: Not on file    Minutes per session: Not on file  . Stress: Not on file  Relationships  . Social connections:    Talks on phone: Not on file    Gets together: Not on file    Attends religious service: Not on file    Active member of club or organization: Not on file    Attends meetings of clubs or organizations: Not on file    Relationship status: Not on file  . Intimate partner violence:    Fear of current or ex partner: Not on file    Emotionally abused: Not on file    Physically abused: Not on file    Forced sexual activity: Not on file  Other Topics Concern  . Not on file  Social History Narrative  . Not on file    Review of Systems: See HPI, otherwise  negative ROS  Physical Exam: There were no vitals taken for this visit. General:   Alert,  pleasant and cooperative in NAD Head:  Normocephalic and atraumatic. Neck:  Supple; no masses or thyromegaly. Lungs:  Clear throughout to auscultation.    Heart:  Regular rate and rhythm. Abdomen:  Soft, nontender and nondistended. Normal bowel sounds, without guarding, and without rebound.   Neurologic:  Alert and  oriented x4;  grossly normal neurologically.  Impression/Plan: Edgar Huynh is here for an endoscopy and colonoscopy to be performed for GERD and PH colon polyps.  Last colonoscopy 06/2013  Risks, benefits, limitations, and alternatives regarding  endoscopy and colonoscopy have been reviewed with the patient.  Questions have been answered.  All parties agreeable.   Gaylyn Cheers, MD  08/25/2018, 8:31 AM

## 2018-08-25 NOTE — Anesthesia Procedure Notes (Signed)
Date/Time: 08/25/2018 8:50 AM Performed by: Allean Found, CRNA Pre-anesthesia Checklist: Patient identified, Emergency Drugs available, Suction available, Patient being monitored and Timeout performed Patient Re-evaluated:Patient Re-evaluated prior to induction Oxygen Delivery Method: Nasal cannula Placement Confirmation: positive ETCO2

## 2018-08-25 NOTE — Op Note (Signed)
[facility] Gastroenterology Patient Name: Edgar Huynh Procedure Date: 08/25/2018 8:32 AM MRN: [id number] Account #: [id number] Date of Birth: Mar 22, 1956 Admit Type: Outpatient Age: 63 Room: [facility] ENDO ROOM 1 Gender: Male Note Status: Finalized Procedure:            Colonoscopy Indications:          High risk colon cancer surveillance: Personal history                        of colonic polyps Providers:            Manya Silvas, MD Referring MD:         No Local Md, MD (Referring MD) Medicines:            Propofol per Anesthesia Complications:        No immediate complications. Procedure:            Pre-Anesthesia Assessment:                       - After reviewing the risks and benefits, the patient                        was deemed in satisfactory condition to undergo the                        procedure.                       After obtaining informed consent, the colonoscope was                        passed under direct vision. Throughout the procedure,                        the patient's blood pressure, pulse, and oxygen                        saturations were monitored continuously. The                        Colonoscope was introduced through the anus and                        advanced to the the cecum, identified by appendiceal                        orifice and ileocecal valve. The colonoscopy was                        performed without difficulty. The patient tolerated the                        procedure well. The quality of the bowel preparation                        was good. Findings:      Internal hemorrhoids were found during endoscopy. The hemorrhoids were       small and Grade I (internal hemorrhoids that do not prolapse).      The exam was otherwise without abnormality. Impression:           -  Internal hemorrhoids.                       - The examination was otherwise normal.                       - No specimens  collected. Recommendation:       - Repeat colonoscopy in 5 years for surveillance. Manya Silvas, MD 08/25/2018 9:21:39 AM This report has been signed electronically. Number of Addenda: 0 Note Initiated On: 08/25/2018 8:32 AM Scope Withdrawal Time: 0 hours 10 minutes 34 seconds  Total Procedure Duration: 0 hours 17 minutes 27 seconds       [facility]

## 2018-08-28 LAB — SURGICAL PATHOLOGY

## 2018-12-01 ENCOUNTER — Ambulatory Visit: Payer: Self-pay

## 2018-12-05 ENCOUNTER — Ambulatory Visit: Payer: Self-pay

## 2018-12-08 ENCOUNTER — Ambulatory Visit: Payer: Self-pay

## 2018-12-12 ENCOUNTER — Ambulatory Visit: Payer: Self-pay

## 2018-12-15 ENCOUNTER — Ambulatory Visit: Payer: Self-pay

## 2018-12-19 ENCOUNTER — Ambulatory Visit: Payer: Self-pay

## 2018-12-22 ENCOUNTER — Ambulatory Visit: Payer: Self-pay

## 2018-12-26 ENCOUNTER — Ambulatory Visit: Payer: Self-pay

## 2018-12-29 ENCOUNTER — Ambulatory Visit: Payer: Self-pay

## 2019-01-02 ENCOUNTER — Ambulatory Visit: Payer: Self-pay

## 2019-01-05 ENCOUNTER — Ambulatory Visit: Payer: Self-pay

## 2019-01-09 ENCOUNTER — Ambulatory Visit: Payer: Self-pay

## 2019-01-12 ENCOUNTER — Ambulatory Visit: Payer: Self-pay

## 2019-03-31 ENCOUNTER — Emergency Department: Payer: Managed Care, Other (non HMO)

## 2019-03-31 ENCOUNTER — Encounter: Payer: Self-pay | Admitting: Emergency Medicine

## 2019-03-31 ENCOUNTER — Other Ambulatory Visit: Payer: Self-pay

## 2019-03-31 ENCOUNTER — Emergency Department
Admission: EM | Admit: 2019-03-31 | Discharge: 2019-03-31 | Disposition: A | Discharge status: Home / Self Care | Payer: Managed Care, Other (non HMO) | Attending: Emergency Medicine | Admitting: Emergency Medicine

## 2019-03-31 DIAGNOSIS — M62838 Other muscle spasm: Secondary | ICD-10-CM | POA: Insufficient documentation

## 2019-03-31 DIAGNOSIS — M899 Disorder of bone, unspecified: Secondary | ICD-10-CM | POA: Insufficient documentation

## 2019-03-31 DIAGNOSIS — Z7901 Long term (current) use of anticoagulants: Secondary | ICD-10-CM | POA: Diagnosis not present

## 2019-03-31 DIAGNOSIS — Z794 Long term (current) use of insulin: Secondary | ICD-10-CM | POA: Diagnosis not present

## 2019-03-31 DIAGNOSIS — Z79899 Other long term (current) drug therapy: Secondary | ICD-10-CM | POA: Insufficient documentation

## 2019-03-31 DIAGNOSIS — Z8673 Personal history of transient ischemic attack (TIA), and cerebral infarction without residual deficits: Secondary | ICD-10-CM | POA: Diagnosis not present

## 2019-03-31 DIAGNOSIS — I10 Essential (primary) hypertension: Secondary | ICD-10-CM | POA: Diagnosis not present

## 2019-03-31 DIAGNOSIS — M542 Cervicalgia: Secondary | ICD-10-CM

## 2019-03-31 DIAGNOSIS — R937 Abnormal findings on diagnostic imaging of other parts of musculoskeletal system: Secondary | ICD-10-CM

## 2019-03-31 DIAGNOSIS — E109 Type 1 diabetes mellitus without complications: Secondary | ICD-10-CM | POA: Diagnosis not present

## 2019-03-31 LAB — COMPREHENSIVE METABOLIC PANEL
ALT: 23 U/L (ref 0–44)
AST: 18 U/L (ref 15–41)
Albumin: 3.7 g/dL (ref 3.5–5.0)
Alkaline Phosphatase: 77 U/L (ref 38–126)
Anion gap: 10 (ref 5–15)
BUN: 17 mg/dL (ref 8–23)
CO2: 26 mmol/L (ref 22–32)
Calcium: 8.8 mg/dL — ABNORMAL LOW (ref 8.9–10.3)
Chloride: 102 mmol/L (ref 98–111)
Creatinine, Ser: 0.84 mg/dL (ref 0.61–1.24)
GFR calc Af Amer: >60 mL/min (ref >60)
GFR calc non Af Amer: >60 mL/min (ref >60)
Glucose, Bld: 118 mg/dL — ABNORMAL HIGH (ref 70–99)
Potassium: 4.2 mmol/L (ref 3.5–5.1)
Sodium: 138 mmol/L (ref 135–145)
Total Bilirubin: 0.4 mg/dL (ref 0.3–1.2)
Total Protein: 6.9 g/dL (ref 6.5–8.1)

## 2019-03-31 LAB — CBC WITH DIFFERENTIAL/PLATELET
Abs Immature Granulocytes: 0.17 10*3/uL — ABNORMAL HIGH (ref 0.00–0.07)
Basophils Absolute: 0.1 10*3/uL (ref 0.0–0.1)
Basophils Relative: 1 %
Eosinophils Absolute: 0.1 10*3/uL (ref 0.0–0.5)
Eosinophils Relative: 1 %
HCT: 43.7 % (ref 39.0–52.0)
Hemoglobin: 14.7 g/dL (ref 13.0–17.0)
Immature Granulocytes: 1 %
Lymphocytes Relative: 15 %
Lymphs Abs: 2.2 10*3/uL (ref 0.7–4.0)
MCH: 30.2 pg (ref 26.0–34.0)
MCHC: 33.6 g/dL (ref 30.0–36.0)
MCV: 89.9 fL (ref 80.0–100.0)
Monocytes Absolute: 1 10*3/uL (ref 0.1–1.0)
Monocytes Relative: 7 %
Neutro Abs: 10.8 10*3/uL — ABNORMAL HIGH (ref 1.7–7.7)
Neutrophils Relative %: 75 %
Platelets: 349 10*3/uL (ref 150–400)
RBC: 4.86 MIL/uL (ref 4.22–5.81)
RDW: 12.7 % (ref 11.5–15.5)
WBC: 14.5 10*3/uL — ABNORMAL HIGH (ref 4.0–10.5)
nRBC: 0.1 % (ref 0.0–0.2)

## 2019-03-31 MED ORDER — DIPHENHYDRAMINE HCL 50 MG/ML IJ SOLN
INTRAMUSCULAR | Status: AC
  Filled 2019-03-31: qty 1

## 2019-03-31 MED ORDER — DIPHENHYDRAMINE HCL 50 MG/ML IJ SOLN
25.0000 mg | Freq: Once | INTRAMUSCULAR | Status: AC
  Administered 2019-03-31: 25 mg via INTRAVENOUS

## 2019-03-31 MED ORDER — FENTANYL CITRATE (PF) 100 MCG/2ML IJ SOLN
50.0000 ug | Freq: Once | INTRAMUSCULAR | Status: AC
  Administered 2019-03-31: 50 ug via INTRAVENOUS

## 2019-03-31 MED ORDER — FENTANYL CITRATE (PF) 100 MCG/2ML IJ SOLN
100.0000 ug | Freq: Once | INTRAMUSCULAR | Status: DC
  Filled 2019-03-31: qty 2

## 2019-03-31 MED ORDER — NAPROXEN 500 MG PO TABS: 500.0000 mg | ORAL_TABLET | Freq: Two times a day (BID) | ORAL | 2 refills | Status: DC

## 2019-03-31 MED ORDER — TRAMADOL HCL 50 MG PO TABS: 50.0000 mg | ORAL_TABLET | Freq: Four times a day (QID) | ORAL | 0 refills | Status: AC | PRN

## 2019-03-31 MED ORDER — DEXAMETHASONE SODIUM PHOSPHATE 10 MG/ML IJ SOLN
10.0000 mg | Freq: Once | INTRAMUSCULAR | Status: AC
  Administered 2019-03-31: 10 mg via INTRAVENOUS
  Filled 2019-03-31: qty 1

## 2019-03-31 MED ORDER — KETOROLAC TROMETHAMINE 30 MG/ML IJ SOLN
15.0000 mg | Freq: Once | INTRAMUSCULAR | Status: AC
  Administered 2019-03-31: 15 mg via INTRAVENOUS
  Filled 2019-03-31: qty 1

## 2019-03-31 NOTE — ED Notes (Signed)
Dr Kinner at bedside. 

## 2019-03-31 NOTE — ED Provider Notes (Signed)
Patient seen by me in addition to NP.  Significant gradually worsening bilateral neck pain most notable at trapezius insertion sites bilaterally, no significant vertebral tenderness palpation, no trauma.  Viral illness at the beginning of the month.  Is on Abilify which can cause myalgias reports he did not take it today.  We will give IV Toradol, IV fentanyl, Decadron obtain MRI   MRI overall reassuring, possible mild bone marrow edema unclear whether this is causing symptoms.  Received Decadron here, will switch to Naprosyn 500 mg twice daily, tramadol as needed, follow-up with specialist given severe neck pain   Lavonia Drafts, MD 03/31/19 1451

## 2019-03-31 NOTE — ED Triage Notes (Addendum)
Pt arrived via POV with reports of neck pain, reports painful to turn neck. Pt seen by PCP on 8/5 given prednisone which helped and muscle relaxants which did not help.  Pt reports his PCP thinks this is related to taking abilify which he has not taken on 8/15.  Pt denies any injury to his neck. Pt states he has pending ortho referral from his PCP.

## 2019-03-31 NOTE — ED Notes (Signed)
Pt going to xr then to room 6 r/o meningitis. Report given to [facility]

## 2019-03-31 NOTE — ED Notes (Signed)
Patient transported to MRI 

## 2019-03-31 NOTE — ED Notes (Signed)
Pt on phone with MRI for screening 

## 2019-03-31 NOTE — ED Provider Notes (Signed)
[facility] Emergency Department Provider Note ____________________________________________  Time seen: Approximately 12:47 PM  I have reviewed the triage vital signs and the nursing notes.   HISTORY  Chief Complaint Neck Pain    HPI BHARAT ANTILLON is a 63 y.o. male who presents to the emergency department for evaluation and treatment of neck pain. He states that he had some kind of viral illness in late July with fever and URI symptoms. Tested negative for COVID-19. He developed neck pain and was again evaluated by his PCP and prescribed muscle relaxer and azithromycin. Pain progressed and had a second evaluation by PCP who prescribed prednisone. He took the prednisone with some reduction of pain, but as soon as he completed it the pain has returned and is intense. PCP thought that Abilify may have been the root cause, but he has been off of it since mid July.   Past Medical History:  Diagnosis Date  . Anxiety   . Depression   . GERD (gastroesophageal reflux disease)   . Glaucoma   . History of colonic polyps   . Hypercholesteremia   . Hypertension   . Neuropathy   . Stroke ([place])   . Type 1 diabetes [facility])     Patient Active Problem List   Diagnosis Date Noted  . Anxiety disorder 09/23/2017  . MDD (major depressive disorder), single episode, severe with psychotic features ([place]) 03/01/2017  . Adjustment disorder with mixed anxiety and depressed mood 02/28/2017  . Psychosis, affective ([place]) 02/28/2017  . Diabetic retinopathy ([place]) 10/03/2012  . GERD (gastroesophageal reflux disease) 04/04/2012  . Diabetic neuropathy ([place]) 04/04/2012  . DM (diabetes mellitus) type I, controlled, with peripheral vascular disorder ([place]) 02/04/2009  . 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 CAROTID ARTERY ANGIOPLASTY    . COLONOSCOPY    . COLONOSCOPY WITH PROPOFOL N/A  08/25/2018   Procedure: COLONOSCOPY WITH PROPOFOL;  Surgeon: Manya Silvas, MD;  Location: [facility] ENDOSCOPY;  Service: Endoscopy;  Laterality: N/A;  . ESOPHAGOGASTRODUODENOSCOPY    . ESOPHAGOGASTRODUODENOSCOPY (EGD) WITH PROPOFOL N/A 08/25/2018   Procedure: ESOPHAGOGASTRODUODENOSCOPY (EGD) WITH PROPOFOL;  Surgeon: Manya Silvas, MD;  Location: [facility] ENDOSCOPY;  Service: Endoscopy;  Laterality: N/A;  . EYE SURGERY      Prior to Admission medications   Medication Sig Start Date End Date Taking? Authorizing Provider  Alpha-Lipoic Acid 200 MG CAPS Take 200 mg by mouth daily.    [provider]  ARIPiprazole (ABILIFY) 2 MG tablet Take 2 mg by mouth daily.    [provider]  atorvastatin (LIPITOR) 20 MG tablet Take 20 mg by mouth every evening.     [provider]  clonazePAM (KLONOPIN) 0.5 MG tablet Take 0.5 mg by mouth 2 (two) times daily.    [provider]  clopidogrel (PLAVIX) 75 MG tablet Take 75 mg by mouth every evening.    [provider]  escitalopram (LEXAPRO) 10 MG tablet Take 10 mg by mouth daily.    [provider]  gabapentin (NEURONTIN) 300 MG capsule Take 300 mg by mouth at bedtime.    [provider]  hydrOXYzine (ATARAX/VISTARIL) 25 MG tablet Take 1 tablet (25 mg total) by mouth every 6 (six) hours as needed for anxiety. 03/07/17   Derrill Center, NP  insulin aspart (NOVOLOG) 100 UNIT/ML injection Inject 70 Units into the skin continuous.    [provider]  insulin glargine (LANTUS) 100 UNIT/ML injection Inject 28 Units into the skin daily.    [provider]  lisinopril (PRINIVIL,ZESTRIL) 5 MG tablet Take 1 tablet (5 mg total) by mouth every evening. 03/07/17   Derrill Center, NP  Multiple Vitamin (MULTIVITAMIN WITH MINERALS) TABS tablet Take 1 tablet by mouth daily.    [provider]  Omega-3 Fatty Acids (OMEGA-3 2100 PO) Take 1,000 mg by mouth daily.    [provider]   omeprazole (PRILOSEC) 40 MG capsule Take 40 mg by mouth daily.    [provider]    Allergies Clonidine, Clonidine derivatives, and Other  History reviewed. No pertinent family history.  Social History Social History   Tobacco Use  . Smoking status: Never Smoker  . Smokeless tobacco: Never Used  Substance Use Topics  . Alcohol use: Never    Frequency: Never    Comment: occasional  . Drug use: Never    Review of Systems Constitutional: Negative for fever. Cardiovascular: Negative for chest pain. Respiratory: Negative for shortness of breath. Musculoskeletal: Positive for focal midline and diffuse neck pain. Skin: Negative for open wounds or lesions.  Neurological: Negative for decrease in sensation  ____________________________________________   PHYSICAL EXAM:  VITAL SIGNS: ED Triage Vitals  Enc Vitals Group     BP 03/31/19 1206 128/80     Pulse Rate 03/31/19 1206 91     Resp 03/31/19 1206 16     Temp 03/31/19 1206 98.8 F (37.1 C)     Temp Source 03/31/19 1206 Oral     SpO2 03/31/19 1206 96 %     Weight 03/31/19 1203 190 lb (86.2 kg)     Height 03/31/19 1203 5\' 8"  (1.727 m)     Head Circumference --      Peak Flow --      Pain Score 03/31/19 1203 4     Pain Loc --      Pain Edu? --      Excl. in [place]? --     Constitutional: Alert and oriented. Well appearing and in no acute distress. Eyes: Conjunctivae are clear without discharge or drainage Head: Atraumatic Neck: Stiff. Unable to demonstrate any degree of ROM. Respiratory: No cough. Respirations are even and unlabored. Musculoskeletal: See musculoskeletal. Neurologic: Awake, alert, and oriented. No radicular symptoms.  Skin: Intact.   Psychiatric: Affect and behavior are appropriate.  ____________________________________________   LABS (all labs ordered are listed, but only abnormal results are displayed)  Labs Reviewed  CBC WITH DIFFERENTIAL/PLATELET  COMPREHENSIVE METABOLIC PANEL    ____________________________________________  RADIOLOGY  Pending on transfer of care. ____________________________________________   PROCEDURES  Procedures  ____________________________________________   INITIAL IMPRESSION / ASSESSMENT AND PLAN / ED COURSE  JIOVANNI HEETER is a 63 y.o. who presents to the emergency department for treatment of severe neck pain that is progressing despite treatment. See HPI for further details.  Care initiated and then he was taken to x-ray. Upon return to the ER will go to room 6. Care relinquished to Dr. Corky Downs.   Medications  fentaNYL (SUBLIMAZE) injection 100 mcg (has no administration in time range)    Pertinent labs & imaging results that were available during my care of the patient were reviewed by me and considered in my medical decision making (see chart for details).  _________________________________________   FINAL CLINICAL IMPRESSION(S) / ED DIAGNOSES  Final diagnoses:  None    ED Discharge Orders    None  If controlled substance prescribed during this visit, 12 month history viewed on the [place] prior to issuing an initial prescription for Schedule II or III opiod.   Victorino Dike, FNP 03/31/19 1307    Lavonia Drafts, MD 03/31/19 1424

## 2019-06-18 ENCOUNTER — Other Ambulatory Visit: Payer: Self-pay | Admitting: Gastroenterology

## 2019-06-18 ENCOUNTER — Other Ambulatory Visit: Payer: Self-pay

## 2019-06-18 ENCOUNTER — Ambulatory Visit
Admission: RE | Admit: 2019-06-18 | Discharge: 2019-06-18 | Disposition: A | Discharge status: Home / Self Care | Payer: Managed Care, Other (non HMO) | Source: Ambulatory Visit | Attending: Gastroenterology | Admitting: Gastroenterology

## 2019-06-18 DIAGNOSIS — R112 Nausea with vomiting, unspecified: Secondary | ICD-10-CM

## 2019-06-18 MED ORDER — IOHEXOL 300 MG/ML  SOLN
100.0000 mL | Freq: Once | INTRAMUSCULAR | Status: AC | PRN
  Administered 2019-06-18: 100 mL via INTRAVENOUS

## 2019-07-17 ENCOUNTER — Other Ambulatory Visit: Payer: Self-pay | Admitting: Gastroenterology

## 2019-07-17 DIAGNOSIS — K2100 Gastro-esophageal reflux disease with esophagitis, without bleeding: Secondary | ICD-10-CM

## 2019-07-17 DIAGNOSIS — K200 Eosinophilic esophagitis: Secondary | ICD-10-CM

## 2019-07-17 DIAGNOSIS — R110 Nausea: Secondary | ICD-10-CM

## 2019-07-27 ENCOUNTER — Ambulatory Visit
Admission: RE | Admit: 2019-07-27 | Discharge: 2019-07-27 | Disposition: A | Discharge status: Home / Self Care | Payer: Managed Care, Other (non HMO) | Source: Ambulatory Visit | Attending: Gastroenterology | Admitting: Gastroenterology

## 2019-07-27 ENCOUNTER — Other Ambulatory Visit: Payer: Self-pay

## 2019-07-27 DIAGNOSIS — K2100 Gastro-esophageal reflux disease with esophagitis, without bleeding: Secondary | ICD-10-CM

## 2019-07-27 DIAGNOSIS — R110 Nausea: Secondary | ICD-10-CM

## 2019-07-27 DIAGNOSIS — K200 Eosinophilic esophagitis: Secondary | ICD-10-CM

## 2019-11-19 ENCOUNTER — Ambulatory Visit: Payer: Managed Care, Other (non HMO) | Attending: Internal Medicine

## 2020-12-16 ENCOUNTER — Other Ambulatory Visit: Payer: Self-pay | Admitting: Family Medicine

## 2020-12-16 DIAGNOSIS — R1011 Right upper quadrant pain: Secondary | ICD-10-CM

## 2020-12-16 DIAGNOSIS — R4781 Slurred speech: Secondary | ICD-10-CM

## 2020-12-17 ENCOUNTER — Ambulatory Visit
Admission: RE | Admit: 2020-12-17 | Discharge: 2020-12-17 | Disposition: A | Discharge status: Home / Self Care | Payer: Managed Care, Other (non HMO) | Source: Ambulatory Visit | Attending: Family Medicine | Admitting: Family Medicine

## 2020-12-17 ENCOUNTER — Other Ambulatory Visit: Payer: Self-pay

## 2020-12-17 DIAGNOSIS — I6782 Cerebral ischemia: Secondary | ICD-10-CM | POA: Insufficient documentation

## 2020-12-17 DIAGNOSIS — R4781 Slurred speech: Secondary | ICD-10-CM | POA: Insufficient documentation

## 2021-01-08 ENCOUNTER — Ambulatory Visit: Payer: Managed Care, Other (non HMO)

## 2021-02-12 ENCOUNTER — Other Ambulatory Visit: Payer: Managed Care, Other (non HMO)

## 2021-02-23 ENCOUNTER — Ambulatory Visit
Admission: RE | Admit: 2021-02-23 | Discharge: 2021-02-23 | Disposition: A | Discharge status: Home / Self Care | Payer: Medicare Other | Source: Ambulatory Visit | Attending: Family Medicine | Admitting: Family Medicine

## 2021-02-23 ENCOUNTER — Other Ambulatory Visit: Payer: Self-pay

## 2021-02-23 DIAGNOSIS — R1011 Right upper quadrant pain: Secondary | ICD-10-CM | POA: Diagnosis not present

## 2021-02-23 DIAGNOSIS — R4781 Slurred speech: Secondary | ICD-10-CM | POA: Diagnosis present

## 2021-04-09 ENCOUNTER — Emergency Department: Payer: Medicare Other

## 2021-04-09 ENCOUNTER — Other Ambulatory Visit: Payer: Self-pay

## 2021-04-09 ENCOUNTER — Observation Stay
Admission: EM | Admit: 2021-04-09 | Discharge: 2021-04-10 | Disposition: A | Discharge status: Home / Self Care | Payer: Medicare Other | Attending: Internal Medicine | Admitting: Internal Medicine

## 2021-04-09 ENCOUNTER — Observation Stay: Payer: Medicare Other

## 2021-04-09 ENCOUNTER — Encounter: Payer: Self-pay | Admitting: Emergency Medicine

## 2021-04-09 DIAGNOSIS — F418 Other specified anxiety disorders: Secondary | ICD-10-CM | POA: Diagnosis not present

## 2021-04-09 DIAGNOSIS — Z8673 Personal history of transient ischemic attack (TIA), and cerebral infarction without residual deficits: Secondary | ICD-10-CM | POA: Insufficient documentation

## 2021-04-09 DIAGNOSIS — Z20822 Contact with and (suspected) exposure to covid-19: Secondary | ICD-10-CM | POA: Diagnosis not present

## 2021-04-09 DIAGNOSIS — R079 Chest pain, unspecified: Secondary | ICD-10-CM

## 2021-04-09 DIAGNOSIS — K219 Gastro-esophageal reflux disease without esophagitis: Secondary | ICD-10-CM | POA: Diagnosis not present

## 2021-04-09 DIAGNOSIS — R0602 Shortness of breath: Secondary | ICD-10-CM | POA: Diagnosis present

## 2021-04-09 DIAGNOSIS — Z794 Long term (current) use of insulin: Secondary | ICD-10-CM | POA: Diagnosis not present

## 2021-04-09 DIAGNOSIS — E109 Type 1 diabetes mellitus without complications: Secondary | ICD-10-CM | POA: Diagnosis not present

## 2021-04-09 DIAGNOSIS — I10 Essential (primary) hypertension: Secondary | ICD-10-CM | POA: Diagnosis not present

## 2021-04-09 DIAGNOSIS — E785 Hyperlipidemia, unspecified: Secondary | ICD-10-CM

## 2021-04-09 DIAGNOSIS — I639 Cerebral infarction, unspecified: Secondary | ICD-10-CM

## 2021-04-09 DIAGNOSIS — E1051 Type 1 diabetes mellitus with diabetic peripheral angiopathy without gangrene: Secondary | ICD-10-CM

## 2021-04-09 LAB — BRAIN NATRIURETIC PEPTIDE: B Natriuretic Peptide: 13.8 pg/mL (ref 0.0–100.0)

## 2021-04-09 LAB — CBC
HCT: 46.7 % (ref 39.0–52.0)
Hemoglobin: 15.9 g/dL (ref 13.0–17.0)
MCH: 30.4 pg (ref 26.0–34.0)
MCHC: 34 g/dL (ref 30.0–36.0)
MCV: 89.3 fL (ref 80.0–100.0)
Platelets: 218 10*3/uL (ref 150–400)
RBC: 5.23 MIL/uL (ref 4.22–5.81)
RDW: 13.1 % (ref 11.5–15.5)
WBC: 7.1 10*3/uL (ref 4.0–10.5)
nRBC: 0 % (ref 0.0–0.2)

## 2021-04-09 LAB — CBG MONITORING, ED: Glucose-Capillary: 172 mg/dL — ABNORMAL HIGH (ref 70–99)

## 2021-04-09 LAB — BASIC METABOLIC PANEL
Anion gap: 9 (ref 5–15)
BUN: 15 mg/dL (ref 8–23)
CO2: 26 mmol/L (ref 22–32)
Calcium: 9.2 mg/dL (ref 8.9–10.3)
Chloride: 103 mmol/L (ref 98–111)
Creatinine, Ser: 1.08 mg/dL (ref 0.61–1.24)
GFR, Estimated: >60 mL/min (ref >60)
Glucose, Bld: 138 mg/dL — ABNORMAL HIGH (ref 70–99)
Potassium: 3.8 mmol/L (ref 3.5–5.1)
Sodium: 138 mmol/L (ref 135–145)

## 2021-04-09 LAB — HEMOGLOBIN A1C
Hgb A1c MFr Bld: 7.2 % — ABNORMAL HIGH (ref 4.8–5.6)
Mean Plasma Glucose: 159.94 mg/dL

## 2021-04-09 LAB — RESP PANEL BY RT-PCR (FLU A&B, COVID) ARPGX2
Influenza A by PCR: NEGATIVE
Influenza B by PCR: NEGATIVE
SARS Coronavirus 2 by RT PCR: NEGATIVE

## 2021-04-09 LAB — TROPONIN I (HIGH SENSITIVITY)
Troponin I (High Sensitivity): 6 ng/L (ref <18)
Troponin I (High Sensitivity): 6 ng/L (ref <18)
Troponin I (High Sensitivity): 6 ng/L (ref <18)

## 2021-04-09 MED ORDER — FUROSEMIDE 10 MG/ML IJ SOLN
40.0000 mg | Freq: Once | INTRAMUSCULAR | Status: AC
  Administered 2021-04-09: 40 mg via INTRAVENOUS
  Filled 2021-04-09: qty 4

## 2021-04-09 MED ORDER — ALBUTEROL SULFATE (2.5 MG/3ML) 0.083% IN NEBU: 3.0000 mL | INHALATION_SOLUTION | RESPIRATORY_TRACT | Status: DC | PRN

## 2021-04-09 MED ORDER — IOHEXOL 350 MG/ML SOLN
75.0000 mL | Freq: Once | INTRAVENOUS | Status: AC | PRN
  Administered 2021-04-09: 75 mL via INTRAVENOUS

## 2021-04-09 MED ORDER — NITROGLYCERIN 0.4 MG SL SUBL: 0.4000 mg | SUBLINGUAL_TABLET | SUBLINGUAL | Status: DC | PRN

## 2021-04-09 MED ORDER — INSULIN ASPART 100 UNIT/ML IJ SOLN: 0.0000 [IU] | Freq: Every day | INTRAMUSCULAR | Status: DC

## 2021-04-09 MED ORDER — POTASSIUM CHLORIDE CRYS ER 20 MEQ PO TBCR
20.0000 meq | EXTENDED_RELEASE_TABLET | Freq: Once | ORAL | Status: AC
  Administered 2021-04-09: 20 meq via ORAL
  Filled 2021-04-09: qty 1

## 2021-04-09 MED ORDER — HYDRALAZINE HCL 20 MG/ML IJ SOLN: 5.0000 mg | INTRAMUSCULAR | Status: DC | PRN

## 2021-04-09 MED ORDER — ONDANSETRON HCL 4 MG/2ML IJ SOLN: 4.0000 mg | Freq: Three times a day (TID) | INTRAMUSCULAR | Status: DC | PRN

## 2021-04-09 MED ORDER — INSULIN ASPART 100 UNIT/ML IJ SOLN: 0.0000 [IU] | Freq: Three times a day (TID) | INTRAMUSCULAR | Status: DC

## 2021-04-09 MED ORDER — ACETAMINOPHEN 325 MG PO TABS: 650.0000 mg | ORAL_TABLET | Freq: Four times a day (QID) | ORAL | Status: DC | PRN

## 2021-04-09 MED ORDER — MORPHINE SULFATE (PF) 2 MG/ML IV SOLN: 2.0000 mg | INTRAVENOUS | Status: DC | PRN

## 2021-04-09 NOTE — Consult Note (Signed)
Cardiology Consultation:   Patient ID: Edgar Huynh MRN: [id number]; DOB: 1956-03-11  Admit date: 04/09/2021 Date of Consult: 04/09/2021  PCP:  Donnamarie Rossetti, PA-C   [facility] Providers Cardiologist:  new to [facility] Physician requesting consult: Dr. Blaine Hamper Reason for consult: Shortness of breath, chest pain  Patient Profile:   Edgar Huynh is a 65 y.o. male with a hx of anxiety/depression/type 1 diabetes presenting to the emergency room with shortness of breath, epigastric tightness  History of Present Illness:   Edgar Huynh reports having seen by primary care, Dr. Venetia Maxon Also followed by Dr. Honor Junes for diabetes Has had increasing shortness of breath, leg swelling over the past 2 weeks Has taken Lasix 20 mg for 2 days, minimal urine output Had stress echo through primary care's office, unable to walk for approximately 2 minutes 20 seconds, gave out on the treadmill had to stop the test  Not very active at baseline A1c 6.9  In the emergency room troponin negative x2, Blood pressure elevated 170/104  Chest CT scan with contrast Mosaic pattern of both lungs noted suggesting air trapping secondary to small airway disease or less likely multifocal inflammation  COVID-negative BNP 13.8   Past Medical History:  Diagnosis Date   Anxiety    Depression    GERD (gastroesophageal reflux disease)    Glaucoma    History of colonic polyps    Hypercholesteremia    Hypertension    Neuropathy    Stroke ([place])    Type 1 diabetes ([place])     Past Surgical History:  Procedure Laterality Date   CAROTID ARTERY ANGIOPLASTY     COLONOSCOPY     COLONOSCOPY WITH PROPOFOL N/A 08/25/2018   Procedure: COLONOSCOPY WITH PROPOFOL;  Surgeon: Manya Silvas, MD;  Location: [facility] ENDOSCOPY;  Service: Endoscopy;  Laterality: N/A;   ESOPHAGOGASTRODUODENOSCOPY     ESOPHAGOGASTRODUODENOSCOPY (EGD) WITH PROPOFOL N/A 08/25/2018   Procedure: ESOPHAGOGASTRODUODENOSCOPY (EGD) WITH  PROPOFOL;  Surgeon: Manya Silvas, MD;  Location: [facility] ENDOSCOPY;  Service: Endoscopy;  Laterality: N/A;   EYE SURGERY       Home Medications:  Prior to Admission medications   Medication Sig Start Date End Date Taking? Authorizing Provider  Alpha-Lipoic Acid 200 MG CAPS Take 200 mg by mouth in the morning and at bedtime.   Yes [provider]  amLODipine (NORVASC) 5 MG tablet Take 5 mg by mouth every evening. 03/17/21  Yes [provider]  ARIPiprazole (ABILIFY) 2 MG tablet Take 10 mg by mouth every evening.   Yes [provider]  atorvastatin (LIPITOR) 20 MG tablet Take 20 mg by mouth every evening.    Yes [provider]  Baclofen 5 MG TABS Take 1 tablet by mouth 3 (three) times daily. 01/22/21  Yes [provider]  clonazePAM (KLONOPIN) 0.5 MG tablet Take 0.5 mg by mouth 2 (two) times daily.   Yes [provider]  clopidogrel (PLAVIX) 75 MG tablet Take 75 mg by mouth every evening.   Yes [provider]  escitalopram (LEXAPRO) 10 MG tablet Take 10 mg by mouth daily.   Yes [provider]  furosemide (LASIX) 20 MG tablet Take 20 mg by mouth daily. 04/07/21  Yes [provider]  gabapentin (NEURONTIN) 300 MG capsule Take 300 mg by mouth 3 (three) times daily.   Yes [provider]  insulin aspart (NOVOLOG) 100 UNIT/ML injection Inject 70 Units into the skin continuous.   Yes [provider]  Multiple Vitamin (  MULTIVITAMIN WITH MINERALS) TABS tablet Take 1 tablet by mouth daily.   Yes [provider]  omeprazole (PRILOSEC) 40 MG capsule Take 40 mg by mouth in the morning and at bedtime.   Yes [provider]  pregabalin (LYRICA) 75 MG capsule Take 1 capsule by mouth 3 (three) times daily. 04/01/21  Yes [provider]  rivastigmine (EXELON) 4.5 MG capsule Take 4.5 mg by mouth 2 (two) times daily. 03/09/21  Yes [provider]  hydrOXYzine (ATARAX/VISTARIL) 25 MG  tablet Take 1 tablet (25 mg total) by mouth every 6 (six) hours as needed for anxiety. Patient not taking: Reported on 04/09/2021 03/07/17   Derrill Center, NP  insulin glargine (LANTUS) 100 UNIT/ML injection Inject 28 Units into the skin daily. Patient not taking: Reported on 04/09/2021    [provider]  lisinopril (PRINIVIL,ZESTRIL) 5 MG tablet Take 1 tablet (5 mg total) by mouth every evening. Patient not taking: Reported on 04/09/2021 03/07/17   Derrill Center, NP  naproxen (NAPROSYN) 500 MG tablet Take 1 tablet (500 mg total) by mouth 2 (two) times daily with a meal. Patient not taking: Reported on 04/09/2021 03/31/19   Lavonia Drafts, MD  Omega-3 Fatty Acids (OMEGA-3 2100 PO) Take 1,000 mg by mouth daily. Patient not taking: Reported on 04/09/2021    [provider]    Inpatient Medications: Scheduled Meds:  insulin aspart  0-5 Units Subcutaneous QHS   insulin aspart  0-9 Units Subcutaneous TID WC   Continuous Infusions:  PRN Meds: acetaminophen, albuterol, hydrALAZINE, morphine injection, nitroGLYCERIN, ondansetron (ZOFRAN) IV  Allergies:    Allergies  Allergen Reactions   Clonidine Rash   Clonidine Derivatives Rash   Other Rash    Clonidine 0.3 mg patch     Social History:   Social History   Socioeconomic History   Marital status: Married    Spouse name: Not on file   Number of children: Not on file   Years of education: Not on file   Highest education level: Not on file  Occupational History   Not on file  Tobacco Use   Smoking status: Never   Smokeless tobacco: Never  Vaping Use   Vaping Use: Never used  Substance and Sexual Activity   Alcohol use: Never    Comment: occasional   Drug use: Never   Sexual activity: Not on file  Other Topics Concern   Not on file  Social History Narrative   Not on file   Social Determinants of Health   Financial Resource Strain: Not on file  Food Insecurity: Not on file  Transportation Needs: Not on  file  Physical Activity: Not on file  Stress: Not on file  Social Connections: Not on file  Intimate Partner Violence: Not on file    Family History:    Family History  Problem Relation Age of Onset   Parkinson's disease Father    Diabetes Mellitus II Brother      ROS:  Please see the history of present illness.  Review of Systems  Constitutional: Negative.   HENT: Negative.    Respiratory:  Positive for shortness of breath.   Cardiovascular:  Positive for chest pain and palpitations.  Gastrointestinal: Negative.   Musculoskeletal: Negative.   Neurological: Negative.   Psychiatric/Behavioral: Negative.    All other systems reviewed and are negative.   Physical Exam/Data:   Vitals:   04/09/21 1530 04/09/21 1600 04/09/21 1630 04/09/21 1730  BP: 127/77 139/79 135/76 134/80  Pulse: 73 89 80 (!) 53  Resp: '16 18  16  '$ Temp:      TempSrc:      SpO2:  94% 94% 96%  Weight:      Height:       No intake or output data in the 24 hours ending 04/09/21 1810 Last 3 Weights 04/09/2021 03/31/2019 08/25/2018  Weight (lbs) 212 lb 190 lb 180 lb  Weight (kg) 96.163 kg 86.183 kg 81.647 kg  Some encounter information is confidential and restricted. Go to Review Flowsheets activity to see all data.     Body mass index is 32.23 kg/m.  General:  Well nourished, well developed, in no acute distress HEENT: normal Lymph: no adenopathy Neck: no JVD Endocrine:  No thryomegaly Vascular: No carotid bruits; FA pulses 2+ bilaterally without bruits  Cardiac:  normal S1, S2; RRR; no murmur  Lungs:  clear to auscultation bilaterally, no wheezing, rhonchi or rales  Abd: soft, nontender, no hepatomegaly  Ext: no edema Musculoskeletal:  No deformities, BUE and BLE strength normal and equal Skin: warm and dry  Neuro:  CNs 2-12 intact, no focal abnormalities noted Psych:  Normal affect   EKG:  The EKG was personally reviewed and demonstrates:   Normal sinus rhythm rate 106 bpm nonspecific ST  abnormality V4 through V6, 2 3 aVF  Telemetry:  Telemetry was personally reviewed and demonstrates:   No sinus rhythm Relevant CV Studies:   Laboratory Data:  High Sensitivity Troponin:   Recent Labs  Lab 04/09/21 1059 04/09/21 1321 04/09/21 1554  TROPONINIHS '6 6 6     '$ Chemistry Recent Labs  Lab 04/09/21 1059  NA 138  K 3.8  CL 103  CO2 26  GLUCOSE 138*  BUN 15  CREATININE 1.08  CALCIUM 9.2  GFRNONAA >60  ANIONGAP 9    No results for input(s): PROT, ALBUMIN, AST, ALT, ALKPHOS, BILITOT in the last 168 hours. Hematology Recent Labs  Lab 04/09/21 1059  WBC 7.1  RBC 5.23  HGB 15.9  HCT 46.7  MCV 89.3  MCH 30.4  MCHC 34.0  RDW 13.1  PLT 218   BNP Recent Labs  Lab 04/09/21 1321  BNP 13.8    DDimer No results for input(s): DDIMER in the last 168 hours.   Radiology/Studies:  DG Chest 2 View  Result Date: 04/09/2021 CLINICAL DATA:  Shortness of breath EXAM: CHEST - 2 VIEW COMPARISON:  02/28/2017 FINDINGS: The heart size and mediastinal contours are within normal limits. Both lungs are clear. The visualized skeletal structures are unremarkable. IMPRESSION: No acute abnormality of the lungs. Electronically Signed   By: Eddie Candle M.D.   On: 04/09/2021 11:50   CT Angio Chest PE W and/or Wo Contrast  Result Date: 04/09/2021 CLINICAL DATA:  Dyspnea on exertion. EXAM: CT ANGIOGRAPHY CHEST WITH CONTRAST TECHNIQUE: Multidetector CT imaging of the chest was performed using the standard protocol during bolus administration of intravenous contrast. Multiplanar CT image reconstructions and MIPs were obtained to evaluate the vascular anatomy. CONTRAST:  69m OMNIPAQUE IOHEXOL 350 MG/ML SOLN COMPARISON:  None. FINDINGS: Cardiovascular: Satisfactory opacification of the pulmonary arteries to the segmental level. No evidence of pulmonary embolism. Normal heart size. No pericardial effusion. Mediastinum/Nodes: No enlarged mediastinal, hilar, or axillary lymph nodes. Thyroid  gland, trachea, and esophagus demonstrate no significant findings. Lungs/Pleura: No pneumothorax or pleural effusion is noted. Mosaic pattern of both lungs is noted suggesting air trapping secondary to small airways disease or less likely multifocal inflammation. Upper Abdomen: No  acute abnormality. Musculoskeletal: No chest wall abnormality. No acute or significant osseous findings. Review of the MIP images confirms the above findings. IMPRESSION: No definite evidence of pulmonary embolus. Mosaic pattern of both lungs is noted suggesting air trapping secondary to small airways disease or less likely multifocal inflammation. Electronically Signed   By: Marijo Conception M.D.   On: 04/09/2021 17:41     Assessment and Plan:   Shortness of breath, chest tightness CT chest with no aortic atherosclerosis, no significant coronary calcification -EKG with nonspecific findings -Cardiac enzymes negative -Symptoms he describes concerning for diastolic CHF Took Lasix 20 mg x 2 days no significant improvement -BNP is not particularly elevated -Echocardiogram pending, Discussed other options with him Some atypical features, cardiac catheterization likely not warranted -Additional ischemic work-up ordered with pharmacologic Myoview -Other option would be cardiac CTA but this is not offered as inpatient and he prefers to have work-up done while he is here -We will give 1 dose Lasix 40 mg IV this evening  Diabetes type 1 Managed by primary care/endocrinology  Depression/anxiety Menstrual primary care, on Abilify, Klonopin, Lexapro  Essential hypertension Blood pressure elevated on arrival ,[id number] systolic Repeat blood pressure improved  History of stroke On Lipitor, Plavix    Total encounter time more than 110 minutes  Greater than 50% was spent in counseling and coordination of care with the patient    For questions or updates, please contact [facility] Please consult [email or web address] for contact  info under    Signed, Ida Rogue, MD  04/09/2021 6:10 PM

## 2021-04-09 NOTE — ED Notes (Signed)
Dr Niu at bedside 

## 2021-04-09 NOTE — Plan of Care (Signed)

## 2021-04-09 NOTE — ED Notes (Signed)
Report given to Marc RN

## 2021-04-09 NOTE — ED Triage Notes (Signed)
Pt via POV from home. Pt had a stress test yesterday and states that he failed. Pt c/o SOB on exertion, palpitations, dry cough and lightheaded. Pt's PCP told him to come her. Pt is A&OX4 and NAD.

## 2021-04-09 NOTE — ED Provider Notes (Signed)
[facility] Emergency Department Provider Note ____________________________________________   Event Date/Time   First MD Initiated Contact with Patient 04/09/21 1247     (approximate)  I have reviewed the triage vital signs and the nursing notes.   HISTORY  Chief Complaint Shortness of Breath    HPI ALEKSY BARRILLEAUX is a 65 y.o. male with PMH as noted below but no prior cardiac history who presents with exertional shortness of breath over the last week, occurring anytime he exerts himself even minimally such as walking upstairs or going outside to pick up the mail.  The shortness of breath is associated with palpitations, chest pain, and often with lightheadedness and near syncope.  The patient states that the symptoms resolved at rest.  He states he has had them more mildly for several weeks but they have really escalated within the last week.  He had a stress echo ordered by his PMD yesterday which he states he "failed."   Past Medical History:  Diagnosis Date   Anxiety    Depression    GERD (gastroesophageal reflux disease)    Glaucoma    History of colonic polyps    Hypercholesteremia    Hypertension    Neuropathy    Stroke ([place])    Type 1 diabetes ([place])     Patient Active Problem List   Diagnosis Date Noted   SOB (shortness of breath) 04/09/2021   HLD (hyperlipidemia) 04/09/2021   HTN (hypertension) 04/09/2021   Stroke ([place]) 04/09/2021   Anxiety disorder 09/23/2017   MDD (major depressive disorder), single episode, severe with psychotic features ([place]) 03/01/2017   Adjustment disorder with mixed anxiety and depressed mood 02/28/2017   Psychosis, 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[place]) 02/28/2017   Diabetic retinopathy ([place]) 10/03/2012   GERD (gastroesophageal reflux disease) 04/04/2012   Diabetic neuropathy ([place]) 04/04/2012   DM (diabetes mellitus) type I, controlled, with peripheral vascular disorder ([place]) 02/04/2009   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 02/04/2009    Past Surgical History:  Procedure Laterality Date   CAROTID ARTERY ANGIOPLASTY     COLONOSCOPY     COLONOSCOPY WITH PROPOFOL N/A 08/25/2018   Procedure: COLONOSCOPY WITH PROPOFOL;  Surgeon: Manya Silvas, MD;  Location: [facility] ENDOSCOPY;  Service: Endoscopy;  Laterality: N/A;   ESOPHAGOGASTRODUODENOSCOPY     ESOPHAGOGASTRODUODENOSCOPY (EGD) WITH PROPOFOL N/A 08/25/2018   Procedure: ESOPHAGOGASTRODUODENOSCOPY (EGD) WITH PROPOFOL;  Surgeon: Manya Silvas, MD;  Location: [facility] ENDOSCOPY;  Service: Endoscopy;  Laterality: N/A;   EYE SURGERY      Prior to Admission medications   Medication Sig Start Date End Date Taking? Authorizing Provider  Alpha-Lipoic Acid 200 MG CAPS Take 200 mg by mouth daily.    [provider]  ARIPiprazole (ABILIFY) 2 MG tablet Take 2 mg by mouth daily.    [provider]  atorvastatin (LIPITOR) 20 MG tablet Take 20 mg by mouth every evening.     [provider]  clonazePAM (KLONOPIN) 0.5 MG tablet Take 0.5 mg by mouth 2 (two) times daily.    [provider]  clopidogrel (PLAVIX) 75 MG tablet Take 75 mg by mouth every evening.    [provider]  escitalopram (LEXAPRO) 10 MG tablet Take 10 mg by mouth daily.    [provider]  gabapentin (NEURONTIN) 300 MG capsule Take 300 mg by mouth at bedtime.    [provider]  hydrOXYzine (ATARAX/VISTARIL) 25 MG tablet Take 1 tablet (25 mg total) by mouth every  6 (six) hours as needed for anxiety. 03/07/17   Derrill Center, NP  insulin aspart (NOVOLOG) 100 UNIT/ML injection Inject 70 Units into the skin continuous.    [provider]  insulin glargine (LANTUS) 100 UNIT/ML injection Inject 28 Units into the skin daily.    [provider]  lisinopril (PRINIVIL,ZESTRIL) 5 MG tablet Take 1 tablet (5 mg total) by mouth every evening. 03/07/17   Derrill Center, NP  Multiple Vitamin (MULTIVITAMIN WITH  MINERALS) TABS tablet Take 1 tablet by mouth daily.    [provider]  naproxen (NAPROSYN) 500 MG tablet Take 1 tablet (500 mg total) by mouth 2 (two) times daily with a meal. 03/31/19   Lavonia Drafts, MD  Omega-3 Fatty Acids (OMEGA-3 2100 PO) Take 1,000 mg by mouth daily.    [provider]  omeprazole (PRILOSEC) 40 MG capsule Take 40 mg by mouth daily.    [provider]    Allergies Clonidine, Clonidine derivatives, and Other  History reviewed. No pertinent family history.  Social History Social History   Tobacco Use   Smoking status: Never   Smokeless tobacco: Never  Vaping Use   Vaping Use: Never used  Substance Use Topics   Alcohol use: Never    Comment: occasional   Drug use: Never    Review of Systems  Constitutional: No fever. Eyes: No visual changes. ENT: No sore throat. Cardiovascular: Positive for exertional chest pain. Respiratory: Positive for exertional shortness of breath. Gastrointestinal: No vomiting or diarrhea.  Genitourinary: Negative for flank pain. Musculoskeletal: Negative for back pain. Skin: Negative for rash. Neurological: Negative for headache.   ____________________________________________   PHYSICAL EXAM:  VITAL SIGNS: ED Triage Vitals  Enc Vitals Group     BP 04/09/21 1101 (!) 170/104     Pulse Rate 04/09/21 1101 (!) 111     Resp 04/09/21 1101 20     Temp 04/09/21 1101 98.4 F (36.9 C)     Temp Source 04/09/21 1101 Oral     SpO2 04/09/21 1101 94 %     Weight 04/09/21 1057 212 lb (96.2 kg)     Height 04/09/21 1057 '5\' 8"'$  (1.727 m)     Head Circumference --      Peak Flow --      Pain Score 04/09/21 1057 0     Pain Loc --      Pain Edu? --      Excl. in [place]? --     Constitutional: Alert and oriented. Well appearing and in no acute distress. Eyes: Conjunctivae are normal.  Head: Atraumatic. Nose: No congestion/rhinnorhea. Mouth/Throat: Mucous membranes are moist.   Neck: Normal range of motion.   Cardiovascular: Normal rate, regular rhythm. Grossly normal heart sounds.  Good peripheral circulation. Respiratory: Normal respiratory effort.  No retractions. Lungs CTAB. Gastrointestinal: Soft and nontender. No distention.  Genitourinary: No CVA tenderness. Musculoskeletal: 1+ bilateral lower extremity edema.  Extremities warm and well perfused.  Neurologic:  Normal speech and language. No gross focal neurologic deficits are appreciated.  Skin:  Skin is warm and dry. No rash noted. Psychiatric: Mood and affect are normal. Speech and behavior are normal.  ____________________________________________   LABS (all labs ordered are listed, but only abnormal results are displayed)  Labs Reviewed  BASIC METABOLIC PANEL - Abnormal; Notable for the following components:      Result Value   Glucose, Bld 138 (*)    All other components within normal limits  RESP PANEL BY  RT-PCR (FLU A&B, COVID) ARPGX2  CBC  BRAIN NATRIURETIC PEPTIDE  HEMOGLOBIN A1C  URINE DRUG SCREEN, QUALITATIVE ([facility] ONLY)  TROPONIN I (HIGH SENSITIVITY)  TROPONIN I (HIGH SENSITIVITY)  TROPONIN I (HIGH SENSITIVITY)   ____________________________________________  EKG  ED ECG REPORT I, Arta Silence, the attending physician, personally viewed and interpreted this ECG.  Date: 04/09/2021 EKG Time: 1058 Rate: 106 Rhythm: Is tachycardia QRS Axis: normal Intervals: normal ST/T Wave abnormalities: Nonspecific ST abnormality Narrative Interpretation: Nonspecific abnormalities with no evidence of acute ischemia  ____________________________________________  RADIOLOGY  Chest x-ray interpreted by me shows no focal consolidation or edema CT angio chest: Pending ____________________________________________   PROCEDURES  Procedure(s) performed: No  Procedures  Critical Care performed: No ____________________________________________   INITIAL IMPRESSION / ASSESSMENT AND PLAN / ED COURSE  Pertinent  labs & imaging results that were available during my care of the patient were reviewed by me and considered in my medical decision making (see chart for details).   65 year old male with PMH as noted above including hypertension and hyperlipidemia presents with shortness of breath associated with chest pain, palpitations, near syncope associated with minimal exertion over the last week.  He went for a stress echo ordered by his PMD yesterday which he states was abnormal.  I reviewed the past medical records in [place] and confirmed that an echo stress test was performed yesterday and was abnormal,showing a hypokinetic anterior wall, and "positive" EKG.    On exam currently the patient is well-appearing.  His vital signs are normal.  He has 1+ pitting edema to bilateral lower extremities but the exam is otherwise unremarkable.  Differential includes ACS, new onset CHF, other cardiac etiology, PE, versus possible electrolyte abnormality, AKI, or other metabolic cause.  We will obtain labs including cardiac enzymes and BNP, CT angio to rule out PE, and reassess.  ----------------------------------------- 3:50 PM on 04/09/2021 -----------------------------------------  Lab work-up is unremarkable.  CT chest is pending.  I consulted Dr. Rockey Situ from cardiology who will evaluate the patient and agrees with admission for further monitoring of the patient's symptoms are significant.  I then consulted Dr. Blaine Hamper from the hospitalist service for admission.     ____________________________________________   FINAL CLINICAL IMPRESSION(S) / ED DIAGNOSES  Final diagnoses:  Exertional chest pain      NEW MEDICATIONS STARTED DURING THIS VISIT:  New Prescriptions   No medications on file     Note:  This document was prepared using Dragon voice recognition software and may include unintentional dictation errors.    Arta Silence, MD 04/09/21 [phone]

## 2021-04-09 NOTE — H&P (Signed)
History and Physical    Edgar Huynh [id number] DOB: 12/28/1955 DOA: 04/09/2021  Referring MD/NP/PA:   PCP: Edgar Rossetti, PA-C   Patient coming from:  The patient is coming from home.  At baseline, pt is independent for most of ADL.        Chief Complaint: SOB and chest pain  HPI: Edgar Huynh is a 65 y.o. male with medical history significant of type 1 diabetes on insulin pump, hypertension, hyperlipidemia, stroke, GERD, depression with anxiety, psychosis-effective, who presents with shortness breath and chest pain.  Patient states that he has been having shortness of breath for more than 2 weeks, which is aggravated by exertion.  Patient also has intermittent mild chest pain, which is located in left lateral chest, pressure-like, nonradiating, currently chest pain-free.  He also has palpitation.  Patient has mild dry cough.  No fever or chills.  Patient state that he has bilateral leg edema.  His PCP started him on Lasix 20 mg daily in the past 2 days, leg edema has improved, but shortness of breath did not.  Patient does not have nausea, vomiting, diarrhea or abdominal pain.  No symptoms of UTI. His PCP ordered echo stress test yesterday, which showed abnormal EKG and hypokinetic anterior wall, with EF > 55%, but the test was a poor test, only walked 2 minutes.   ED Course: pt was found to have trop 6 --> 6, WBC 7.1, BNP 13.8, pending COVID-19 PCR, electrolytes renal function okay, temperature normal, blood pressure 170/104, 115/68, heart rate 111, 70, RR 20, oxygen saturation 94% on room air.  Chest x-ray negative.  Patient is placed on progressive bed for position, Dr. Rockey Huynh for cardiology is consulted  Review of Systems:   General: no fevers, chills, no body weight gain, has fatigue HEENT: no blurry vision, hearing changes or sore throat Respiratory: has dyspnea, coughing, no wheezing CV: has chest pain, no palpitations GI: no nausea, vomiting, abdominal pain,  diarrhea, constipation GU: no dysuria, burning on urination, increased urinary frequency, hematuria  Ext: has leg edema Neuro: no unilateral weakness, numbness, or tingling, no vision change or hearing loss Skin: no rash, no skin tear. MSK: No muscle spasm, no deformity, no limitation of range of movement in spin Heme: No easy bruising.  Travel history: No recent long distant travel.  Allergy:  Allergies  Allergen Reactions   Clonidine Rash   Clonidine Derivatives Rash   Other Rash    Clonidine 0.3 mg patch     Past Medical History:  Diagnosis Date   Anxiety    Depression    GERD (gastroesophageal reflux disease)    Glaucoma    History of colonic polyps    Hypercholesteremia    Hypertension    Neuropathy    Stroke ([place])    Type 1 diabetes ([place])     Past Surgical History:  Procedure Laterality Date   CAROTID ARTERY ANGIOPLASTY     COLONOSCOPY     COLONOSCOPY WITH PROPOFOL N/A 08/25/2018   Procedure: COLONOSCOPY WITH PROPOFOL;  Surgeon: Edgar Silvas, MD;  Location: [facility] ENDOSCOPY;  Service: Endoscopy;  Laterality: N/A;   ESOPHAGOGASTRODUODENOSCOPY     ESOPHAGOGASTRODUODENOSCOPY (EGD) WITH PROPOFOL N/A 08/25/2018   Procedure: ESOPHAGOGASTRODUODENOSCOPY (EGD) WITH PROPOFOL;  Surgeon: Edgar Silvas, MD;  Location: [facility] ENDOSCOPY;  Service: Endoscopy;  Laterality: N/A;   EYE SURGERY      Social History:  reports that he has never smoked. He has never used smokeless tobacco. He  reports that he does not drink alcohol and does not use drugs.  Family History:  Family History  Problem Relation Age of Onset   Parkinson's disease Father    Diabetes Mellitus II Brother      Prior to Admission medications   Medication Sig Start Date End Date Taking? Authorizing Provider  Alpha-Lipoic Acid 200 MG CAPS Take 200 mg by mouth daily.    [provider]  ARIPiprazole (ABILIFY) 2 MG tablet Take 2 mg by mouth daily.    [provider]  atorvastatin (LIPITOR)  20 MG tablet Take 20 mg by mouth every evening.     [provider]  clonazePAM (KLONOPIN) 0.5 MG tablet Take 0.5 mg by mouth 2 (two) times daily.    [provider]  clopidogrel (PLAVIX) 75 MG tablet Take 75 mg by mouth every evening.    [provider]  escitalopram (LEXAPRO) 10 MG tablet Take 10 mg by mouth daily.    [provider]  gabapentin (NEURONTIN) 300 MG capsule Take 300 mg by mouth at bedtime.    [provider]  hydrOXYzine (ATARAX/VISTARIL) 25 MG tablet Take 1 tablet (25 mg total) by mouth every 6 (six) hours as needed for anxiety. 03/07/17   Derrill Center, NP  insulin aspart (NOVOLOG) 100 UNIT/ML injection Inject 70 Units into the skin continuous.    [provider]  insulin glargine (LANTUS) 100 UNIT/ML injection Inject 28 Units into the skin daily.    [provider]  lisinopril (PRINIVIL,ZESTRIL) 5 MG tablet Take 1 tablet (5 mg total) by mouth every evening. 03/07/17   Derrill Center, NP  Multiple Vitamin (MULTIVITAMIN WITH MINERALS) TABS tablet Take 1 tablet by mouth daily.    [provider]  naproxen (NAPROSYN) 500 MG tablet Take 1 tablet (500 mg total) by mouth 2 (two) times daily with a meal. 03/31/19   Lavonia Drafts, MD  Omega-3 Fatty Acids (OMEGA-3 2100 PO) Take 1,000 mg by mouth daily.    [provider]  omeprazole (PRILOSEC) 40 MG capsule Take 40 mg by mouth daily.    [provider]    Physical Exam: Vitals:   04/09/21 1430 04/09/21 1500 04/09/21 1530 04/09/21 1600  BP: 138/80 140/72 127/77 139/79  Pulse: 79 76 73 89  Resp:   16 18  Temp:      TempSrc:      SpO2: 94%   94%  Weight:      Height:       General: Not in acute distress HEENT:       Eyes: PERRL, EOMI, no scleral icterus.       ENT: No discharge from the ears and nose, no pharynx injection, no tonsillar enlargement.        Neck: No JVD, no bruit, no mass felt. Heme: No neck lymph node  enlargement. Cardiac: S1/S2, RRR, No murmurs, No gallops or rubs. Respiratory: No rales, wheezing, rhonchi or rubs. GI: Soft, nondistended, nontender, no rebound pain, no organomegaly, BS present. GU: No hematuria Ext: 1+ pitting leg edema bilaterally. 1+DP/PT pulse bilaterally. Musculoskeletal: No joint deformities, No joint redness or warmth, no limitation of ROM in spin. Skin: No rashes.  Neuro: Alert, oriented X3, cranial nerves II-XII grossly intact, moves all extremities normally. Psych: Patient is not psychotic, no suicidal or hemocidal ideation.  Labs on Admission: I have personally reviewed following labs and imaging studies  CBC: Recent Labs  Lab 04/09/21 1059  WBC 7.1  HGB 15.9  HCT 46.7  MCV 89.3  PLT 99991111   Basic Metabolic Panel: Recent Labs  Lab 04/09/21 1059  NA 138  K 3.8  CL 103  CO2 26  GLUCOSE 138*  BUN 15  CREATININE 1.08  CALCIUM 9.2   GFR: Estimated Creatinine Clearance: 76.7 mL/min (by C-G formula based on SCr of 1.08 mg/dL). Liver Function Tests: No results for input(s): AST, ALT, ALKPHOS, BILITOT, PROT, ALBUMIN in the last 168 hours. No results for input(s): LIPASE, AMYLASE in the last 168 hours. No results for input(s): AMMONIA in the last 168 hours. Coagulation Profile: No results for input(s): INR, PROTIME in the last 168 hours. Cardiac Enzymes: No results for input(s): CKTOTAL, CKMB, CKMBINDEX, TROPONINI in the last 168 hours. BNP (last 3 results) No results for input(s): PROBNP in the last 8760 hours. HbA1C: No results for input(s): HGBA1C in the last 72 hours. CBG: No results for input(s): GLUCAP in the last 168 hours. Lipid Profile: No results for input(s): CHOL, HDL, LDLCALC, TRIG, CHOLHDL, LDLDIRECT in the last 72 hours. Thyroid Function Tests: No results for input(s): TSH, T4TOTAL, FREET4, T3FREE, THYROIDAB in the last 72 hours. Anemia Panel: No results for input(s): VITAMINB12, FOLATE, FERRITIN, TIBC, IRON, RETICCTPCT in the  last 72 hours. Urine analysis:    Component Value Date/Time   COLORURINE YELLOW (A) 09/23/2017 1614   APPEARANCEUR CLEAR (A) 09/23/2017 1614   LABSPEC 1.018 09/23/2017 1614   PHURINE 6.0 09/23/2017 1614   GLUCOSEU 150 (A) 09/23/2017 1614   HGBUR NEGATIVE 09/23/2017 1614   BILIRUBINUR NEGATIVE 09/23/2017 1614   KETONESUR NEGATIVE 09/23/2017 1614   PROTEINUR NEGATIVE 09/23/2017 1614   NITRITE NEGATIVE 09/23/2017 1614   LEUKOCYTESUR NEGATIVE 09/23/2017 1614   Sepsis Labs: '@LABRCNTIP'$ (procalcitonin:4,lacticidven:4) ) Recent Results (from the past 240 hour(s))  Resp Panel by RT-PCR (Flu A&B, Covid) Nasopharyngeal Swab     Status: None   Collection Time: 04/09/21  3:54 PM   Specimen: Nasopharyngeal Swab; Nasopharyngeal(NP) swabs in vial transport medium  Result Value Ref Range Status   SARS Coronavirus 2 by RT PCR NEGATIVE NEGATIVE Final    Comment: (NOTE) SARS-CoV-2 target nucleic acids are NOT DETECTED.  The SARS-CoV-2 RNA is generally detectable in upper respiratory specimens during the acute phase of infection. The lowest concentration of SARS-CoV-2 viral copies this assay can detect is 138 copies/mL. A negative result does not preclude SARS-Cov-2 infection and should not be used as the sole basis for treatment or other patient management decisions. A negative result may occur with  improper specimen collection/handling, submission of specimen other than nasopharyngeal swab, presence of viral mutation(s) within the areas targeted by this assay, and inadequate number of viral copies(<138 copies/mL). A negative result must be combined with clinical observations, patient history, and epidemiological information. The expected result is Negative.  Fact Sheet for Patients:  EntrepreneurPulse.com.au  Fact Sheet for Healthcare Providers:  IncredibleEmployment.be  This test is no t yet approved or cleared by the [place] FDA and  has been  authorized for detection and/or diagnosis of SARS-CoV-2 by FDA under an Emergency Use Authorization (EUA). This EUA will remain  in effect (meaning this test can be used) for the duration of the COVID-19 declaration under Section 564(b)(1) of the Act, 21 U.S.C.section 360bbb-3(b)(1), unless the authorization is terminated  or revoked sooner.       Influenza A by PCR NEGATIVE NEGATIVE Final   Influenza B by PCR NEGATIVE NEGATIVE Final    Comment: (NOTE) The Xpert Xpress SARS-CoV-2/FLU/RSV plus assay is  intended as an aid in the diagnosis of influenza from Nasopharyngeal swab specimens and should not be used as a sole basis for treatment. Nasal washings and aspirates are unacceptable for Xpert Xpress SARS-CoV-2/FLU/RSV testing.  Fact Sheet for Patients: EntrepreneurPulse.com.au  Fact Sheet for Healthcare Providers: IncredibleEmployment.be  This test is not yet approved or cleared by the [place] FDA and has been authorized for detection and/or diagnosis of SARS-CoV-2 by FDA under an Emergency Use Authorization (EUA). This EUA will remain in effect (meaning this test can be used) for the duration of the COVID-19 declaration under Section 564(b)(1) of the Act, 21 U.S.C. section 360bbb-3(b)(1), unless the authorization is terminated or revoked.  Performed at [facility], [address] [place], [place] [address]      Radiological Exams on Admission: DG Chest 2 View  Result Date: 04/09/2021 CLINICAL DATA:  Shortness of breath EXAM: CHEST - 2 VIEW COMPARISON:  02/28/2017 FINDINGS: The heart size and mediastinal contours are within normal limits. Both lungs are clear. The visualized skeletal structures are unremarkable. IMPRESSION: No acute abnormality of the lungs. Electronically Signed   By: Eddie Candle M.D.   On: 04/09/2021 11:50     EKG: I have personally reviewed.  Sinus rhythm, QTC 441, LAE, nonspecific T wave  change  Assessment/Plan Principal Problem:   SOB (shortness of breath) Active Problems:   GERD (gastroesophageal reflux disease)   DM (diabetes mellitus) type I, controlled, with peripheral vascular disorder (HCC)   HLD (hyperlipidemia)   HTN (hypertension)   Stroke (HCC)   Chest pain   Depression with anxiety   SOB (shortness of breath) and chest pain: Etiology is not clear.  Differential diagnosis include acute CHF, CAD, and PE.  Patient's BNP is 13.8, chest x-ray is negative for pulmonary edema, does not seem to have typical acute CHF.  Patient had 2D echo stress test yesterday, which is a poor test, but it showed abnormal EKG and hypokinetic anterior wall indicating possible CAD.   Pending CT angiogram to rule out PE. Dr. Rockey Huynh for cardiology is consulted.   - place to progressive unit for observation - Trend Trop - prn Nitroglycerin, Morphine - pt is on plavix and lipitor - Risk factor stratification: will check FLP and A1C  - check UDS - f/u CAT to r/o PE -Continue Lasix 20 mg daily  GERD (gastroesophageal reflux disease) -protonix  DM (diabetes mellitus) type I, controlled, with peripheral vascular disorder ([place]): -on insulin pump  HLD (hyperlipidemia) -lipitor  HTN (hypertension) -IV hydralazine as needed -Amlodipine, Lasix  Stroke (HCC) -Lipitor and Plavix  Depression and anxiety: -Continue home Abilify, Klonopin, Lexapro,         DVT ppx: SQ Lovenox Code Status: Full code Family Communication:   Yes, patient's  wife  at bed side Disposition Plan:  Anticipate discharge back to previous environment Consults called:  Dr. Rockey Huynh of card Admission status and Level of care: Progressive Cardiac:       progressive unit for obs     Status is: Observation  The patient remains OBS appropriate and will d/c before 2 midnights.  Dispo: The patient is from: Home              Anticipated d/c is to: Home              Patient currently is not medically  stable to d/c.   Difficult to place patient No         Date of Service 04/09/2021  Ivor Costa [facility]   If 7PM-7AM, please contact night-coverage [email or web address] 04/09/2021, 5:25 PM

## 2021-04-09 NOTE — ED Notes (Signed)
Informed RN bed assigned 

## 2021-04-10 ENCOUNTER — Observation Stay (HOSPITAL_BASED_OUTPATIENT_CLINIC_OR_DEPARTMENT_OTHER): Payer: Medicare Other

## 2021-04-10 ENCOUNTER — Observation Stay (HOSPITAL_BASED_OUTPATIENT_CLINIC_OR_DEPARTMENT_OTHER)
Admit: 2021-04-10 | Discharge: 2021-04-10 | Disposition: A | Discharge status: Home / Self Care | Payer: Medicare Other | Attending: Cardiovascular Disease | Admitting: Cardiovascular Disease

## 2021-04-10 DIAGNOSIS — K219 Gastro-esophageal reflux disease without esophagitis: Secondary | ICD-10-CM

## 2021-04-10 DIAGNOSIS — R079 Chest pain, unspecified: Secondary | ICD-10-CM

## 2021-04-10 DIAGNOSIS — R0602 Shortness of breath: Secondary | ICD-10-CM

## 2021-04-10 DIAGNOSIS — E1051 Type 1 diabetes mellitus with diabetic peripheral angiopathy without gangrene: Secondary | ICD-10-CM | POA: Diagnosis not present

## 2021-04-10 DIAGNOSIS — F418 Other specified anxiety disorders: Secondary | ICD-10-CM | POA: Diagnosis not present

## 2021-04-10 LAB — NM MYOCAR MULTI W/SPECT W/WALL MOTION / EF
LV dias vol: 62 mL (ref 62–150)
LV sys vol: 26 mL
Nuc Stress EF: 58 %
Peak HR: 102 {beats}/min
Rest HR: 73 {beats}/min
Rest Nuclear Isotope Dose: 10.5 mCi
SDS: 0
SRS: 4
SSS: 3
ST Depression (mm): 0 mm
Stress Nuclear Isotope Dose: 32.1 mCi
TID: 1.1

## 2021-04-10 LAB — BASIC METABOLIC PANEL
Anion gap: 10 (ref 5–15)
BUN: 16 mg/dL (ref 8–23)
CO2: 27 mmol/L (ref 22–32)
Calcium: 8.5 mg/dL — ABNORMAL LOW (ref 8.9–10.3)
Chloride: 103 mmol/L (ref 98–111)
Creatinine, Ser: 0.86 mg/dL (ref 0.61–1.24)
GFR, Estimated: >60 mL/min (ref >60)
Glucose, Bld: 64 mg/dL — ABNORMAL LOW (ref 70–99)
Potassium: 3.8 mmol/L (ref 3.5–5.1)
Sodium: 140 mmol/L (ref 135–145)

## 2021-04-10 LAB — LIPID PANEL
Cholesterol: 151 mg/dL (ref 0–200)
HDL: 46 mg/dL (ref >40)
LDL Cholesterol: 78 mg/dL (ref 0–99)
Total CHOL/HDL Ratio: 3.3 RATIO
Triglycerides: 134 mg/dL (ref <150)
VLDL: 27 mg/dL (ref 0–40)

## 2021-04-10 LAB — GLUCOSE, CAPILLARY: Glucose-Capillary: 124 mg/dL — ABNORMAL HIGH (ref 70–99)

## 2021-04-10 LAB — ECHOCARDIOGRAM COMPLETE
AR max vel: 2.18 cm2
AV Area VTI: 2.21 cm2
AV Area mean vel: 2.17 cm2
AV Mean grad: 3 mmHg
AV Peak grad: 5.1 mmHg
Ao pk vel: 1.13 m/s
Area-P 1/2: 3.39 cm2
Height: 68 in
MV VTI: 2.08 cm2
S' Lateral: 2.5 cm
Weight: 3392 oz

## 2021-04-10 LAB — HIV ANTIBODY (ROUTINE TESTING W REFLEX): HIV Screen 4th Generation wRfx: NONREACTIVE

## 2021-04-10 LAB — TSH: TSH: 0.937 u[IU]/mL (ref 0.350–4.500)

## 2021-04-10 MED ORDER — RIVASTIGMINE TARTRATE 3 MG PO CAPS
4.5000 mg | ORAL_CAPSULE | Freq: Two times a day (BID) | ORAL | Status: DC
  Administered 2021-04-10: 4.5 mg via ORAL
  Filled 2021-04-10 (×2): qty 1

## 2021-04-10 MED ORDER — ADULT MULTIVITAMIN W/MINERALS CH
1.0000 | ORAL_TABLET | Freq: Every day | ORAL | Status: DC
  Administered 2021-04-10: 1 via ORAL
  Filled 2021-04-10: qty 1

## 2021-04-10 MED ORDER — BACLOFEN 10 MG PO TABS
5.0000 mg | ORAL_TABLET | Freq: Three times a day (TID) | ORAL | Status: DC
  Administered 2021-04-10 (×2): 5 mg via ORAL
  Filled 2021-04-10 (×3): qty 0.5

## 2021-04-10 MED ORDER — TECHNETIUM TC 99M TETROFOSMIN IV KIT
10.0000 | PACK | Freq: Once | INTRAVENOUS | Status: AC | PRN
  Administered 2021-04-10: 10.48 via INTRAVENOUS

## 2021-04-10 MED ORDER — PREGABALIN 75 MG PO CAPS
75.0000 mg | ORAL_CAPSULE | Freq: Three times a day (TID) | ORAL | Status: DC
  Administered 2021-04-10 (×2): 75 mg via ORAL
  Filled 2021-04-10 (×2): qty 1

## 2021-04-10 MED ORDER — TECHNETIUM TC 99M TETROFOSMIN IV KIT
30.0000 | PACK | Freq: Once | INTRAVENOUS | Status: AC
  Administered 2021-04-10: 32.1 via INTRAVENOUS

## 2021-04-10 MED ORDER — FUROSEMIDE 10 MG/ML IJ SOLN
40.0000 mg | Freq: Once | INTRAMUSCULAR | Status: AC
  Administered 2021-04-10: 40 mg via INTRAVENOUS
  Filled 2021-04-10: qty 4

## 2021-04-10 MED ORDER — POTASSIUM CHLORIDE CRYS ER 20 MEQ PO TBCR
40.0000 meq | EXTENDED_RELEASE_TABLET | Freq: Once | ORAL | Status: AC
  Administered 2021-04-10: 40 meq via ORAL
  Filled 2021-04-10: qty 4

## 2021-04-10 MED ORDER — ALPHA-LIPOIC ACID 200 MG PO CAPS: 200.0000 mg | ORAL_CAPSULE | Freq: Two times a day (BID) | ORAL | Status: DC

## 2021-04-10 MED ORDER — ATORVASTATIN CALCIUM 20 MG PO TABS: 20.0000 mg | ORAL_TABLET | Freq: Every evening | ORAL | Status: DC

## 2021-04-10 MED ORDER — CLONAZEPAM 0.5 MG PO TABS
0.5000 mg | ORAL_TABLET | Freq: Two times a day (BID) | ORAL | Status: DC
  Administered 2021-04-10: 0.5 mg via ORAL
  Filled 2021-04-10: qty 1

## 2021-04-10 MED ORDER — ESCITALOPRAM OXALATE 10 MG PO TABS
10.0000 mg | ORAL_TABLET | Freq: Every day | ORAL | Status: DC
  Administered 2021-04-10: 10 mg via ORAL
  Filled 2021-04-10: qty 1

## 2021-04-10 MED ORDER — PANTOPRAZOLE SODIUM 40 MG PO TBEC
40.0000 mg | DELAYED_RELEASE_TABLET | Freq: Two times a day (BID) | ORAL | Status: DC
  Administered 2021-04-10: 40 mg via ORAL
  Filled 2021-04-10: qty 1

## 2021-04-10 MED ORDER — ARIPIPRAZOLE 10 MG PO TABS
10.0000 mg | ORAL_TABLET | Freq: Every evening | ORAL | Status: DC
  Filled 2021-04-10: qty 1

## 2021-04-10 MED ORDER — CLOPIDOGREL BISULFATE 75 MG PO TABS: 75.0000 mg | ORAL_TABLET | Freq: Every evening | ORAL | Status: DC

## 2021-04-10 MED ORDER — AMLODIPINE BESYLATE 5 MG PO TABS: 5.0000 mg | ORAL_TABLET | Freq: Every evening | ORAL | Status: DC

## 2021-04-10 MED ORDER — GABAPENTIN 300 MG PO CAPS
300.0000 mg | ORAL_CAPSULE | Freq: Three times a day (TID) | ORAL | Status: DC
  Administered 2021-04-10 (×2): 300 mg via ORAL
  Filled 2021-04-10 (×2): qty 1

## 2021-04-10 MED ORDER — ENOXAPARIN SODIUM 60 MG/0.6ML IJ SOSY
0.5000 mg/kg | PREFILLED_SYRINGE | INTRAMUSCULAR | Status: DC
  Filled 2021-04-10 (×2): qty 0.47

## 2021-04-10 MED ORDER — FUROSEMIDE 20 MG PO TABS
20.0000 mg | ORAL_TABLET | Freq: Every day | ORAL | Status: DC
  Administered 2021-04-10: 20 mg via ORAL
  Filled 2021-04-10: qty 1

## 2021-04-10 MED ORDER — REGADENOSON 0.4 MG/5ML IV SOLN
0.4000 mg | Freq: Once | INTRAVENOUS | Status: AC
  Administered 2021-04-10: 0.4 mg via INTRAVENOUS

## 2021-04-10 MED ORDER — INSULIN PUMP
SUBCUTANEOUS | Status: DC
  Filled 2021-04-10: qty 1

## 2021-04-10 NOTE — Discharge Summary (Addendum)
[facility] NAME: Edgar Huynh    MR#:  [id number]  DATE OF BIRTH:  1956/05/27  DATE OF ADMISSION:  04/09/2021 ADMITTING PHYSICIAN: Ivor Costa, MD  DATE OF DISCHARGE: 04/10/2021  PRIMARY CARE PHYSICIAN: Donnamarie Rossetti, PA-C    ADMISSION DIAGNOSIS:  SOB (shortness of breath) [R06.02] Exertional chest pain [R07.9]  DISCHARGE DIAGNOSIS:  shortness of breath with chest pain-- unclear etiology/ atypical  SECONDARY DIAGNOSIS:   Past Medical History:  Diagnosis Date   Anxiety    Depression    GERD (gastroesophageal reflux disease)    Glaucoma    History of colonic polyps    Hypercholesteremia    Hypertension    Neuropathy    Stroke ([facility])    Type 1 diabetes [facility])     HOSPITAL COURSE:   Edgar Huynh is a 65 y.o. male with medical history significant of type 1 diabetes on insulin pump, hypertension, hyperlipidemia, stroke, GERD, depression with anxiety, psychosis-effective, who presents with shortness breath and chest pain.  Shortness of breath and chest pain etiology unclear/atypical symptoms -- cardiology consultation with Dr. Rockey Situ appreciated. Hip is suspecting mild diastolic congestive heart failure -- patient's labs, radiological studies and vital signs do not jive with the symptoms -- give one dose of IV Lasix-- and resume outpatient Lasix as prescribed by PCP -- sats remain stable BNP normal -- troponin times three negative -- echo within normal limit --Myoview stress test Pharmacological myocardial perfusion imaging study with no significant  ischemia Normal wall motion, EF estimated at 59% No EKG changes concerning for ischemia at peak stress or in recovery. CT attenuation correction images with no aortic atherosclerosis, no coronary calcification Low risk scan  Gerd continue Protonix  Hypertension -- continue amlodipine and Lasix  history of stroke -- continue Lipitor and  Plavix  depression/anxiety -- continue Klonopin, Lexapro, Abilify  overall hemodynamically stable. Okay to discharge from cardiology standpoint. Echo and stress test results were discussed by Dr. Rockey Situ with patient. Patient will follow-up with PCP as outpatient CONSULTS OBTAINED:  Treatment Team:  Minna Merritts, MD  DRUG ALLERGIES:   Allergies  Allergen Reactions   Clonidine Rash   Clonidine Derivatives Rash   Other Rash    Clonidine 0.3 mg patch     DISCHARGE MEDICATIONS:   Allergies as of 04/10/2021       Reactions   Clonidine Rash   Clonidine Derivatives Rash   Other Rash   Clonidine 0.3 mg patch         Medication List     STOP taking these medications    hydrOXYzine 25 MG tablet Commonly known as: ATARAX/VISTARIL   insulin glargine 100 UNIT/ML injection Commonly known as: LANTUS   lisinopril 5 MG tablet Commonly known as: ZESTRIL   naproxen 500 MG tablet Commonly known as: Naprosyn       TAKE these medications    Alpha-Lipoic Acid 200 MG Caps Take 200 mg by mouth in the morning and at bedtime.   amLODipine 5 MG tablet Commonly known as: NORVASC Take 5 mg by mouth every evening.   ARIPiprazole 2 MG tablet Commonly known as: ABILIFY Take 10 mg by mouth every evening.   atorvastatin 20 MG tablet Commonly known as: LIPITOR Take 20 mg by mouth every evening.   Baclofen 5 MG Tabs Take 1 tablet by mouth 3 (three) times daily.   clonazePAM 0.5 MG tablet Commonly known as: KLONOPIN Take 0.5 mg by mouth  2 (two) times daily.   clopidogrel 75 MG tablet Commonly known as: PLAVIX Take 75 mg by mouth every evening.   escitalopram 10 MG tablet Commonly known as: LEXAPRO Take 10 mg by mouth daily.   furosemide 20 MG tablet Commonly known as: LASIX Take 20 mg by mouth daily.   gabapentin 300 MG capsule Commonly known as: NEURONTIN Take 300 mg by mouth 3 (three) times daily.   insulin aspart 100 UNIT/ML injection Commonly known as:  novoLOG Inject 70 Units into the skin continuous.   multivitamin with minerals Tabs tablet Take 1 tablet by mouth daily.   OMEGA-3 2100 PO Take 1,000 mg by mouth daily.   omeprazole 40 MG capsule Commonly known as: PRILOSEC Take 40 mg by mouth in the morning and at bedtime.   pregabalin 75 MG capsule Commonly known as: LYRICA Take 1 capsule by mouth 3 (three) times daily.   rivastigmine 4.5 MG capsule Commonly known as: EXELON Take 4.5 mg by mouth 2 (two) times daily.        If you experience worsening of your admission symptoms, develop shortness of breath, life threatening emergency, suicidal or homicidal thoughts you must seek medical attention immediately by calling 911 or calling your MD immediately  if symptoms less severe.  You Must read complete instructions/literature along with all the possible adverse reactions/side effects for all the Medicines you take and that have been prescribed to you. Take any new Medicines after you have completely understood and accept all the possible adverse reactions/side effects.   Please note  You were cared for by a hospitalist during your hospital stay. If you have any questions about your discharge medications or the care you received while you were in the hospital after you are discharged, you can call the unit and asked to speak with the hospitalist on call if the hospitalist that took care of you is not available. Once you are discharged, your primary care physician will handle any further medical issues. Please note that NO REFILLS for any discharge medications will be authorized once you are discharged, as it is imperative that you return to your primary care physician (or establish a relationship with a primary care physician if you do not have one) for your aftercare needs so that they can reassess your need for medications and monitor your lab values. Today   SUBJECTIVE  patient came in with symptoms feeling of "butterflies in  the chest"   VITAL SIGNS:  Blood pressure 137/77, pulse 84, temperature 98 F (36.7 C), resp. rate 16, height '5\' 8"'$  (1.727 m), weight 96.2 kg, SpO2 94 %.  I/O:   Intake/Output Summary (Last 24 hours) at 04/10/2021 1632 Last data filed at 04/10/2021 1500 Gross per 24 hour  Intake 240 ml  Output 1200 ml  Net -960 ml    PHYSICAL EXAMINATION:  GENERAL:  65 y.o.-year-old patient lying in the bed with no acute distress.  LUNGS: Normal breath sounds bilaterally, no wheezing, rales,rhonchi or crepitation. No use of accessory muscles of respiration.  CARDIOVASCULAR: S1, S2 normal. No murmurs, rubs, or gallops.  ABDOMEN: Soft, non-tender, non-distended. Bowel sounds present. No organomegaly or mass.  EXTREMITIES: No pedal edema, cyanosis, or clubbing.  NEUROLOGIC: Cranial nerves II through XII are intact. Muscle strength 5/5 in all extremities. Sensation intact. Gait not checked.  PSYCHIATRIC: The patient is alert and oriented x 3.  SKIN: No obvious rash, lesion, or ulcer.   DATA REVIEW:   CBC  Recent Labs  Lab  04/09/21 1059  WBC 7.1  HGB 15.9  HCT 46.7  PLT 218    Chemistries  Recent Labs  Lab 04/10/21 0416  NA 140  K 3.8  CL 103  CO2 27  GLUCOSE 64*  BUN 16  CREATININE 0.86  CALCIUM 8.5*    Microbiology Results   Recent Results (from the past 240 hour(s))  Resp Panel by RT-PCR (Flu A&B, Covid) Nasopharyngeal Swab     Status: None   Collection Time: 04/09/21  3:54 PM   Specimen: Nasopharyngeal Swab; Nasopharyngeal(NP) swabs in vial transport medium  Result Value Ref Range Status   SARS Coronavirus 2 by RT PCR NEGATIVE NEGATIVE Final    Comment: (NOTE) SARS-CoV-2 target nucleic acids are NOT DETECTED.  The SARS-CoV-2 RNA is generally detectable in upper respiratory specimens during the acute phase of infection. The lowest concentration of SARS-CoV-2 viral copies this assay can detect is 138 copies/mL. A negative result does not preclude SARS-Cov-2 infection and  should not be used as the sole basis for treatment or other patient management decisions. A negative result may occur with  improper specimen collection/handling, submission of specimen other than nasopharyngeal swab, presence of viral mutation(s) within the areas targeted by this assay, and inadequate number of viral copies(<138 copies/mL). A negative result must be combined with clinical observations, patient history, and epidemiological information. The expected result is Negative.  Fact Sheet for Patients:  EntrepreneurPulse.com.au  Fact Sheet for Healthcare Providers:  IncredibleEmployment.be  This test is no t yet approved or cleared by the [place] FDA and  has been authorized for detection and/or diagnosis of SARS-CoV-2 by FDA under an Emergency Use Authorization (EUA). This EUA will remain  in effect (meaning this test can be used) for the duration of the COVID-19 declaration under Section 564(b)(1) of the Act, 21 U.S.C.section 360bbb-3(b)(1), unless the authorization is terminated  or revoked sooner.       Influenza A by PCR NEGATIVE NEGATIVE Final   Influenza B by PCR NEGATIVE NEGATIVE Final    Comment: (NOTE) The Xpert Xpress SARS-CoV-2/FLU/RSV plus assay is intended as an aid in the diagnosis of influenza from Nasopharyngeal swab specimens and should not be used as a sole basis for treatment. Nasal washings and aspirates are unacceptable for Xpert Xpress SARS-CoV-2/FLU/RSV testing.  Fact Sheet for Patients: EntrepreneurPulse.com.au  Fact Sheet for Healthcare Providers: IncredibleEmployment.be  This test is not yet approved or cleared by the [place] FDA and has been authorized for detection and/or diagnosis of SARS-CoV-2 by FDA under an Emergency Use Authorization (EUA). This EUA will remain in effect (meaning this test can be used) for the duration of the COVID-19 declaration  under Section 564(b)(1) of the Act, 21 U.S.C. section 360bbb-3(b)(1), unless the authorization is terminated or revoked.  Performed at [facility], [address] [place], [place] [address]     RADIOLOGY:  DG Chest 2 View  Result Date: 04/09/2021 CLINICAL DATA:  Shortness of breath EXAM: CHEST - 2 VIEW COMPARISON:  02/28/2017 FINDINGS: The heart size and mediastinal contours are within normal limits. Both lungs are clear. The visualized skeletal structures are unremarkable. IMPRESSION: No acute abnormality of the lungs. Electronically Signed   By: Eddie Candle M.D.   On: 04/09/2021 11:50   CT Angio Chest PE W and/or Wo Contrast  Result Date: 04/09/2021 CLINICAL DATA:  Dyspnea on exertion. EXAM: CT ANGIOGRAPHY CHEST WITH CONTRAST TECHNIQUE: Multidetector CT imaging of the chest was performed using the standard protocol during bolus administration  of intravenous contrast. Multiplanar CT image reconstructions and MIPs were obtained to evaluate the vascular anatomy. CONTRAST:  15m OMNIPAQUE IOHEXOL 350 MG/ML SOLN COMPARISON:  None. FINDINGS: Cardiovascular: Satisfactory opacification of the pulmonary arteries to the segmental level. No evidence of pulmonary embolism. Normal heart size. No pericardial effusion. Mediastinum/Nodes: No enlarged mediastinal, hilar, or axillary lymph nodes. Thyroid gland, trachea, and esophagus demonstrate no significant findings. Lungs/Pleura: No pneumothorax or pleural effusion is noted. Mosaic pattern of both lungs is noted suggesting air trapping secondary to small airways disease or less likely multifocal inflammation. Upper Abdomen: No acute abnormality. Musculoskeletal: No chest wall abnormality. No acute or significant osseous findings. Review of the MIP images confirms the above findings. IMPRESSION: No definite evidence of pulmonary embolus. Mosaic pattern of both lungs is noted suggesting air trapping secondary to small airways disease or less likely  multifocal inflammation. Electronically Signed   By: JMarijo ConceptionM.D.   On: 04/09/2021 17:41   NM Myocar Multi W/Spect W/Wall Motion / EF  Result Date: 04/10/2021   No ST deviation was noted. Pharmacological myocardial perfusion imaging study with no significant  ischemia Normal wall motion, EF estimated at 59% No EKG changes concerning for ischemia at peak stress or in recovery. CT attenuation correction images with no aortic atherosclerosis, no coronary calcification Low risk scan Signed, TEsmond Plants MD, Ph.D [facility]   ECHOCARDIOGRAM COMPLETE  Result Date: 04/10/2021    ECHOCARDIOGRAM REPORT   Patient Name:   BDEMETRIE POTOSKYDate of Exam: 04/10/2021 Medical Rec #:  [id number]     Height:       68.0 in Accession #:    [id number]    Weight:       212.0 lb Date of Birth:  [birth year]     BSA:          2.095 m Patient Age:    89years       BP:           139/78 mmHg Patient Gender: M              HR:           72 bpm. Exam Location:  [facility] Procedure: 2D Echo, Color Doppler, Cardiac Doppler and Strain Analysis Indications:     Shortness of breath; Chest Pain  History:         Patient has no prior history of Echocardiogram examinations.                  Stroke; Risk Factors:Hypertension, Diabetes and HCL.  Sonographer:     JCharmayne SheerReferring Phys:  [id number] Phys: TIda RogueMD  Sonographer Comments: Global longitudinal strain was attempted. IMPRESSIONS  1. Left ventricular ejection fraction, by estimation, is 60 to 65%. The left ventricle has normal function. The left ventricle has no regional wall motion abnormalities. Left ventricular diastolic parameters are consistent with Grade I diastolic dysfunction (impaired relaxation). The average left ventricular global longitudinal strain is -15.0 %. The global longitudinal strain is normal.  2. Right ventricular systolic function is normal. The right ventricular size is normal. Tricuspid regurgitation signal is inadequate for  assessing PA pressure.  3. The inferior vena cava is normal in size with greater than 50% respiratory variability, suggesting right atrial pressure of 3 mmHg. FINDINGS  Left Ventricle: Left ventricular ejection fraction, by estimation, is 60 to 65%. The left ventricle has normal function. The left ventricle has no regional wall  motion abnormalities. The average left ventricular global longitudinal strain is -15.0 %. The global longitudinal strain is normal. The left ventricular internal cavity size was normal in size. There is no left ventricular hypertrophy. Left ventricular diastolic parameters are consistent with Grade I diastolic dysfunction (impaired relaxation). Right Ventricle: The right ventricular size is normal. No increase in right ventricular wall thickness. Right ventricular systolic function is normal. Tricuspid regurgitation signal is inadequate for assessing PA pressure. Left Atrium: Left atrial size was normal in size. Right Atrium: Right atrial size was normal in size. Pericardium: There is no evidence of pericardial effusion. Mitral Valve: The mitral valve is normal in structure. No evidence of mitral valve regurgitation. No evidence of mitral valve stenosis. MV peak gradient, 4.3 mmHg. The mean mitral valve gradient is 2.0 mmHg. Tricuspid Valve: The tricuspid valve is normal in structure. Tricuspid valve regurgitation is not demonstrated. No evidence of tricuspid stenosis. Aortic Valve: The aortic valve is normal in structure. Aortic valve regurgitation is not visualized. No aortic stenosis is present. Aortic valve mean gradient measures 3.0 mmHg. Aortic valve peak gradient measures 5.1 mmHg. Aortic valve area, by VTI measures 2.21 cm. Pulmonic Valve: The pulmonic valve was normal in structure. Pulmonic valve regurgitation is not visualized. No evidence of pulmonic stenosis. Aorta: The aortic root is normal in size and structure. Venous: The inferior vena cava is normal in size with greater than  50% respiratory variability, suggesting right atrial pressure of 3 mmHg. IAS/Shunts: No atrial level shunt detected by color flow Doppler.  LEFT VENTRICLE PLAX 2D LVIDd:         3.74 cm  Diastology LVIDs:         2.50 cm  LV e' medial:    7.07 cm/s LV PW:         1.06 cm  LV E/e' medial:  11.1 LV IVS:        0.97 cm  LV e' lateral:   6.53 cm/s LVOT diam:     1.80 cm  LV E/e' lateral: 12.1 LV SV:         51 LV SV Index:   24       2D Longitudinal Strain LVOT Area:     2.54 cm 2D Strain GLS Avg:     -15.0 %  RIGHT VENTRICLE RV Basal diam:  3.17 cm LEFT ATRIUM             Index       RIGHT ATRIUM           Index LA diam:        3.40 cm 1.62 cm/m  RA Area:     12.70 cm LA Vol (A2C):   38.2 ml 18.23 ml/m RA Volume:   29.80 ml  14.22 ml/m LA Vol (A4C):   33.5 ml 15.99 ml/m LA Biplane Vol: 36.3 ml 17.33 ml/m  AORTIC VALVE                   PULMONIC VALVE AV Area (Vmax):    2.18 cm    PV Vmax:       0.88 m/s AV Area (Vmean):   2.17 cm    PV Vmean:      57.200 cm/s AV Area (VTI):     2.21 cm    PV VTI:        0.157 m AV Vmax:           113.00 cm/s PV Peak grad:  3.1 mmHg AV  Vmean:          77.400 cm/s PV Mean grad:  2.0 mmHg AV VTI:            0.231 m AV Peak Grad:      5.1 mmHg AV Mean Grad:      3.0 mmHg LVOT Vmax:         96.60 cm/s LVOT Vmean:        66.100 cm/s LVOT VTI:          0.201 m LVOT/AV VTI ratio: 0.87  AORTA Ao Root diam: 3.20 cm MITRAL VALVE MV Area (PHT): 3.39 cm     SHUNTS MV Area VTI:   2.08 cm     Systemic VTI:  0.20 m MV Peak grad:  4.3 mmHg     Systemic Diam: 1.80 cm MV Mean grad:  2.0 mmHg MV Vmax:       1.04 m/s MV Vmean:      57.2 cm/s MV Decel Time: 224 msec MV E velocity: 78.80 cm/s MV A velocity: 101.00 cm/s MV E/A ratio:  0.78 Ida Rogue MD Electronically signed by Ida Rogue MD Signature Date/Time: 04/10/2021/12:30:11 PM    Final      CODE STATUS:     Code Status Orders  (From admission, onward)           Start     Ordered   04/10/21 0507  Full code  Continuous         04/10/21 0506           Code Status History     Date Active Date Inactive Code Status Order ID Comments User Context   03/01/2017 0321 03/07/2017 1819 Full Code [id number]  Rennie Plowman Inpatient   02/27/2017 2159 03/01/2017 0053 Full Code [id number]  Fatima Blank, MD ED        TOTAL TIME TAKING CARE OF THIS PATIENT: 35 minutes.    Fritzi Mandes M.D  [facility]    CC: Primary care physician; [place], [employer], PA-C

## 2021-04-10 NOTE — Progress Notes (Signed)
PHARMACIST - PHYSICIAN ORDER COMMUNICATION  CONCERNING: P&T Medication Policy on Herbal Medications  DESCRIPTION:  This patient's order for:  Alpha-Lipoic Acid CAPS 200 mg  has been noted.  This product(s) is classified as an "herbal" or natural product. Due to a lack of definitive safety studies or FDA approval, nonstandard manufacturing practices, plus the potential risk of unknown drug-drug interactions while on inpatient medications, the Pharmacy and Therapeutics Committee does not permit the use of "herbal" or natural products of this type within [facility].   ACTION TAKEN: The pharmacy department is unable to verify this order at this time. Please reevaluate patient's clinical condition at discharge and address if the herbal or natural product(s) should be resumed at that time.  Renda Rolls, PharmD, [facility] 04/10/2021 5:16 AM

## 2021-04-10 NOTE — Progress Notes (Signed)
PHARMACIST - PHYSICIAN COMMUNICATION  CONCERNING:  Enoxaparin (Lovenox) for DVT Prophylaxis    RECOMMENDATION: Patient was prescribed enoxaprin '40mg'$  q24 hours for VTE prophylaxis.   Filed Weights   04/09/21 1057  Weight: 96.2 kg (212 lb)    Body mass index is 32.23 kg/m.  Estimated Creatinine Clearance: 76.7 mL/min (by C-G formula based on SCr of 1.08 mg/dL).   Based on [place] patient is candidate for enoxaparin 0.'5mg'$ /kg TBW SQ every 24 hours based on BMI being >30.  DESCRIPTION: Pharmacy has adjusted enoxaparin dose per [facility] policy.  Patient is now receiving enoxaparin 0.5 mg/kg every 24 hours   Renda Rolls, PharmD, [facility] 04/10/2021 5:24 AM

## 2021-04-10 NOTE — Progress Notes (Signed)
*  PRELIMINARY RESULTS* Echocardiogram 2D Echocardiogram has been performed.  Edgar Huynh 04/10/2021, 9:37 AM

## 2021-04-10 NOTE — Progress Notes (Signed)
Progress Note  Patient Name: Edgar Huynh Date of Encounter: 04/10/2021  [facility] Cardiologist: New  Subjective   Plan for [place]. Patient feels general discomfort in his chest, no pain. Had good UOP with IV lasix.   Inpatient Medications    Scheduled Meds:  amLODipine  5 mg Oral QPM   ARIPiprazole  10 mg Oral QPM   atorvastatin  20 mg Oral QPM   baclofen  5 mg Oral TID   clonazePAM  0.5 mg Oral BID   clopidogrel  75 mg Oral QPM   enoxaparin (LOVENOX) injection  0.5 mg/kg Subcutaneous Q24H   escitalopram  10 mg Oral Daily   furosemide  20 mg Oral Daily   gabapentin  300 mg Oral TID   insulin pump   Subcutaneous Q4H   multivitamin with minerals  1 tablet Oral Daily   pantoprazole  40 mg Oral BID   pregabalin  75 mg Oral TID   rivastigmine  4.5 mg Oral BID   Continuous Infusions:  PRN Meds: acetaminophen, albuterol, hydrALAZINE, morphine injection, nitroGLYCERIN, ondansetron (ZOFRAN) IV   Vital Signs    Vitals:   04/09/21 1600 04/09/21 1630 04/09/21 1730 04/09/21 1933  BP: 139/79 135/76 134/80 139/78  Pulse: 89 80 (!) 53 79  Resp: 18  16   Temp:      TempSrc:      SpO2: 94% 94% 96% 95%  Weight:      Height:        Intake/Output Summary (Last 24 hours) at 04/10/2021 0825 Last data filed at 04/10/2021 0400 Gross per 24 hour  Intake --  Output 300 ml  Net -300 ml   Last 3 Weights 04/09/2021 03/31/2019 08/25/2018  Weight (lbs) 212 lb 190 lb 180 lb  Weight (kg) 96.163 kg 86.183 kg 81.647 kg  Some encounter information is confidential and restricted. Go to Review Flowsheets activity to see all data.      Telemetry    NSR, HR 70s - Personally Reviewed  ECG    Pending stress test - Personally Reviewed  Physical Exam   GEN: No acute distress.   Neck: No JVD Cardiac: RRR, no murmurs, rubs, or gallops.  Respiratory: Clear to auscultation bilaterally. GI: firm, nontender, non-distended  MS: No edema; No deformity. Neuro:  Nonfocal   Psych: Normal affect   Labs    High Sensitivity Troponin:   Recent Labs  Lab 04/09/21 1059 04/09/21 1321 04/09/21 1554  TROPONINIHS '6 6 6      '$ Chemistry Recent Labs  Lab 04/09/21 1059  NA 138  K 3.8  CL 103  CO2 26  GLUCOSE 138*  BUN 15  CREATININE 1.08  CALCIUM 9.2  GFRNONAA >60  ANIONGAP 9     Hematology Recent Labs  Lab 04/09/21 1059  WBC 7.1  RBC 5.23  HGB 15.9  HCT 46.7  MCV 89.3  MCH 30.4  MCHC 34.0  RDW 13.1  PLT 218    BNP Recent Labs  Lab 04/09/21 1321  BNP 13.8     DDimer No results for input(s): DDIMER in the last 168 hours.   Radiology    DG Chest 2 View  Result Date: 04/09/2021 CLINICAL DATA:  Shortness of breath EXAM: CHEST - 2 VIEW COMPARISON:  02/28/2017 FINDINGS: The heart size and mediastinal contours are within normal limits. Both lungs are clear. The visualized skeletal structures are unremarkable. IMPRESSION: No acute abnormality of the lungs. Electronically Signed   By: Eddie Candle  M.D.   On: 04/09/2021 11:50   CT Angio Chest PE W and/or Wo Contrast  Result Date: 04/09/2021 CLINICAL DATA:  Dyspnea on exertion. EXAM: CT ANGIOGRAPHY CHEST WITH CONTRAST TECHNIQUE: Multidetector CT imaging of the chest was performed using the standard protocol during bolus administration of intravenous contrast. Multiplanar CT image reconstructions and MIPs were obtained to evaluate the vascular anatomy. CONTRAST:  92m OMNIPAQUE IOHEXOL 350 MG/ML SOLN COMPARISON:  None. FINDINGS: Cardiovascular: Satisfactory opacification of the pulmonary arteries to the segmental level. No evidence of pulmonary embolism. Normal heart size. No pericardial effusion. Mediastinum/Nodes: No enlarged mediastinal, hilar, or axillary lymph nodes. Thyroid gland, trachea, and esophagus demonstrate no significant findings. Lungs/Pleura: No pneumothorax or pleural effusion is noted. Mosaic pattern of both lungs is noted suggesting air trapping secondary to small airways disease  or less likely multifocal inflammation. Upper Abdomen: No acute abnormality. Musculoskeletal: No chest wall abnormality. No acute or significant osseous findings. Review of the MIP images confirms the above findings. IMPRESSION: No definite evidence of pulmonary embolus. Mosaic pattern of both lungs is noted suggesting air trapping secondary to small airways disease or less likely multifocal inflammation. Electronically Signed   By: JMarijo ConceptionM.D.   On: 04/09/2021 17:41    Cardiac Studies   Echo and Myoview Lexiscan today  Patient Profile     65y.o. male with h/o anxiety/depression, DM1, h/o stroke who we are seeing for shortness of breath.   Assessment & Plan    Shortness of breath - CT chest with no significant CAD and negative for PE - BNP normal and troponin negative.  - PTA lasix '20mg'$  daily.  - s/p IV lasix '40mg'$  daily and he had good UOP - AM labs pending - check TSH - plan for Myoview stress test today - echo ordered  HTN - BP elevated on presentation with SBP 170s - amlodipine '5mg'$  daily - BP mildly elevated>>can add low does BB  H/o of stroke - continue lipitor and plavix - LDL 78 03/2021 - can increase lipitor for goal LDL<70   For questions or updates, please contact [facility] Please consult [email or web address] for contact info under        Signed, Analys Ryden HNinfa Meeker PA-C  04/10/2021, 8:25 AM

## 2021-04-10 NOTE — Progress Notes (Addendum)
Inpatient Diabetes Program Recommendations  AACE/ADA: New Consensus Statement on Inpatient Glycemic Control (2015)  Target Ranges:  Prepandial:   less than 140 mg/dL      Peak postprandial:   less than 180 mg/dL (1-2 hours)      Critically ill patients:  140 - 180 mg/dL  Results for Edgar Huynh, Edgar Huynh (MRN [id number]) as of 04/10/2021 08:48  Ref. Range 04/09/2021 17:30 04/10/2021 08:44  Glucose-Capillary Latest Ref Range: 70 - 99 mg/dL 172 (H) 124 (H)   Admit with: SOB/ CP  History: Type 1 Diabetes  Home DM Meds: Insulin Pump  Current Orders: Insulin Pump Q4 hours   Met with pt this AM.  Pt A&O and able to independently manage insulin pump.  Has extra pump supplies at bedside when needed.  Discussed with pt that he may need to temporarily remove his insulin pump when he goes to get his Myoview Lexiscan heart study (if radiation involved should remove pump during study).  Instructed pt to ask about radiation down in the study area, and if radiation used, remove pump and place outside of study room.  Once procedure complete, pt knows to resume insulin pump immediately.  Sent Secure Chat to RN caring of pt today with instructions to: 1) Document any/all insulin boluses given by pt with pump in the MAR, 2) Check CBGs Q4 hours with hospital meter, and 3) Complete insulin pump assessment Qshift in flowsheets.    Endocrinologist: Dr. Honor Junes with [facility] Last visit 12/26/2020--A1c at that visit was 6.9% Confirmed Insulin Pump Settings with pt this AM (08/26).  See settings below: Basal rates MN=0.95 3am=1.15 6am=1.1 10am=1.0 TDD basal: 24.7 units   Bolus settings I/C: 4 ISF: 40 Target Glucose: 90-140 Active insulin time: 4 hours   --Will follow patient during hospitalization--  Wyn Quaker RN, MSN, CDE Diabetes Coordinator Inpatient Glycemic Control Team Team Pager: [phone] (8a-5p)

## 2021-08-26 ENCOUNTER — Encounter: Payer: Self-pay | Admitting: Dietician

## 2021-08-26 ENCOUNTER — Encounter: Payer: Medicare Other | Attending: Family Medicine | Admitting: Dietician

## 2021-08-26 ENCOUNTER — Other Ambulatory Visit: Payer: Self-pay

## 2021-08-26 VITALS — Ht 68.0 in | Wt 196.3 lb

## 2021-08-26 DIAGNOSIS — E782 Mixed hyperlipidemia: Secondary | ICD-10-CM

## 2021-08-26 DIAGNOSIS — E785 Hyperlipidemia, unspecified: Secondary | ICD-10-CM | POA: Insufficient documentation

## 2021-08-26 DIAGNOSIS — K219 Gastro-esophageal reflux disease without esophagitis: Secondary | ICD-10-CM | POA: Insufficient documentation

## 2021-08-26 DIAGNOSIS — I110 Hypertensive heart disease with heart failure: Secondary | ICD-10-CM | POA: Diagnosis not present

## 2021-08-26 DIAGNOSIS — G4730 Sleep apnea, unspecified: Secondary | ICD-10-CM | POA: Diagnosis not present

## 2021-08-26 DIAGNOSIS — I509 Heart failure, unspecified: Secondary | ICD-10-CM | POA: Insufficient documentation

## 2021-08-26 DIAGNOSIS — E109 Type 1 diabetes mellitus without complications: Secondary | ICD-10-CM | POA: Diagnosis not present

## 2021-08-26 DIAGNOSIS — I10 Essential (primary) hypertension: Secondary | ICD-10-CM

## 2021-08-26 DIAGNOSIS — E1051 Type 1 diabetes mellitus with diabetic peripheral angiopathy without gangrene: Secondary | ICD-10-CM

## 2021-08-26 NOTE — Patient Instructions (Signed)
Try some crock pot meals, other simple meals at home to reduce restaurant meals.  Make use of resources online for more options and ideas.

## 2021-08-26 NOTE — Progress Notes (Signed)
Medical Nutrition Therapy: Visit start time: 0950  end time: 1100  Assessment:  Diagnosis: Type 1 DM, hyperlipidemia, HTN, CHF  Past medical history: eosinophilic esophagitis, GERD, sleep apnea Psychosocial issues/ stress concerns: none  Preferred learning method:  Visual   Current weight: 196.5lbs Height: 5'8" BMI: 29.85 Medications, supplements: reconciled list in medical record  Progress and evaluation:  Patient states he has had Type 1 DM since 1981; has been to diabetes education visits but not since 1990s. He is carb counting, sometimes general estimates of carb amounts, especially with restaurant meals (he knows there are often hidden sources of sugar/ carbs). Reports occasional hypoglycemia, treats with quick-dissolving candy (smarties).  Adjusts bolus when BG spikes up, which sometimes leads to low BGs Many meals are takeout as wife is unable to cook due to physical disability.  CHO insulin ration is 4:1 with sensitivity of 1 unit decreasing BG 40mg /dl.  He is concerned about having eosinophilic esophagitis, had severe episode after drinking energy drink [employer] Lighter), and also some reaction after drinking nutritional drink ie Glucerna/ boost. He has not had reaction to any other food or drink. He reports having health crisis which led to diagnosis of CHF, but he states that diagnosis has since been changed.  Recent labs: (04/09/21) HbA1C 7.2%, total cholesterol 151, HDL 46, LDL 78, triglycerides 134  Physical activity: some general activity, at times limited by knee pain.  Dietary Intake:  Usual eating pattern includes 2 meals and 0-1 snacks per day. Dining out frequency: 10-12 meals per week.  Breakfast: usually skips; occasionally breakfast bar ie [place] protein -- does prepare cereal or frozen waffles for wife, mother Snack: none Lunch: pimento cheese or banana sandwich, sometimes takeout sandwich, avoids fast food restaurants  Snack: none unless low BG Supper: [employer]  diner, [employer], [employer], [employer] -- meat + veg plate meals Snack: none unless BG is low then maybe cookie Beverages: water, diet Pepsi and Mt Dew, coffee in am  Nutrition Care Education: Topics covered:  Basic nutrition: appropriate nutrient balance, appropriate meal and snack schedule, general nutrition guidelines    Advanced nutrition:  recipe modification, cooking techniques for quick, simple meal options, label reading for carbohydrates Diabetes: appropriate meal and snack schedule, appropriate carb intake and balance and carb counting, healthy carb choices and glycemic index potentially affecting insulin requirement; roles of fiber, protein, fat Hypertension, Hyperlipidemia: encouraged diet pattern to emphasize vegetable and fruit intake, minimally processed foods, low in animal fats to help control blood pressure and blood lipids, and minimize inflammatory effects   Nutritional Diagnosis:  -2.2 Altered nutrition-related laboratory As related to Type 1 diabetes and hyperlipidemia.  As evidenced by elevated HbA1C, history of elevated total cholesterol, LDL, and triglycerides with history of hypertension.  Intervention:  Instruction and discussion as noted above. Patient works to control carb intake and food choices; assuming caregiver role and little experience limits his time and ability to prepare meals; therefore, has been relying on frequent restaurant meals.  Established nutrition goals with direction from patient.  No follow up MNT scheduled at this time; patient to schedule later as needed.  Education Materials given:  Museum/gallery conservator with food lists Diabetes- Friendly Recipes with additional online resources [employer] Glycemic index handout Visit summary with goals/ instructions to be viewed via MyChart   Learner/ who was taught:  Patient    Level of understanding: Verbalizes/ demonstrates competency   Demonstrated degree of understanding via:   Teach  back Learning barriers: None  Willingness to learn/ readiness for change: Eager, change in progress   Monitoring and Evaluation:  Dietary intake, exercise, BG control, blood lipids and blood pressure, and body weight      follow up: prn

## 2021-10-20 ENCOUNTER — Other Ambulatory Visit: Payer: Self-pay | Admitting: Sports Medicine

## 2021-10-20 DIAGNOSIS — M1712 Unilateral primary osteoarthritis, left knee: Secondary | ICD-10-CM

## 2021-10-20 DIAGNOSIS — M25462 Effusion, left knee: Secondary | ICD-10-CM

## 2021-10-20 DIAGNOSIS — M76892 Other specified enthesopathies of left lower limb, excluding foot: Secondary | ICD-10-CM

## 2021-10-20 DIAGNOSIS — M25562 Pain in left knee: Secondary | ICD-10-CM

## 2021-10-22 ENCOUNTER — Ambulatory Visit: Payer: Medicare Other | Admitting: Dermatology

## 2021-10-22 ENCOUNTER — Other Ambulatory Visit: Payer: Self-pay

## 2021-10-22 DIAGNOSIS — L219 Seborrheic dermatitis, unspecified: Secondary | ICD-10-CM

## 2021-10-22 DIAGNOSIS — L814 Other melanin hyperpigmentation: Secondary | ICD-10-CM

## 2021-10-22 DIAGNOSIS — L570 Actinic keratosis: Secondary | ICD-10-CM | POA: Diagnosis not present

## 2021-10-22 DIAGNOSIS — L821 Other seborrheic keratosis: Secondary | ICD-10-CM

## 2021-10-22 DIAGNOSIS — D1800 Hemangioma unspecified site: Secondary | ICD-10-CM

## 2021-10-22 DIAGNOSIS — Z1283 Encounter for screening for malignant neoplasm of skin: Secondary | ICD-10-CM

## 2021-10-22 DIAGNOSIS — L578 Other skin changes due to chronic exposure to nonionizing radiation: Secondary | ICD-10-CM

## 2021-10-22 DIAGNOSIS — I781 Nevus, non-neoplastic: Secondary | ICD-10-CM | POA: Diagnosis not present

## 2021-10-22 DIAGNOSIS — D229 Melanocytic nevi, unspecified: Secondary | ICD-10-CM

## 2021-10-22 MED ORDER — KETOCONAZOLE 2 % EX CREA: 1.0000 "application " | TOPICAL_CREAM | CUTANEOUS | 11 refills | Status: AC

## 2021-10-22 MED ORDER — HYDROCORTISONE 2.5 % EX CREA
TOPICAL_CREAM | CUTANEOUS | 11 refills | Status: DC
Start: 2021-10-22 — End: 2022-01-05

## 2021-10-22 NOTE — Progress Notes (Unsigned)
New Patient Visit  Subjective  Edgar Huynh is a 66 y.o. male who presents for the following: New Patient (Initial Visit) (Patient here today for tbse. Patient reports a few precancers at face with ln2. Patient denies family or personal history of skin cancer. Place on chest that has been there a while he would like checked. ).  The patient presents for Total-Body Skin Exam (TBSE) for skin cancer screening and mole check.  The patient has spots, moles and lesions to be evaluated, some may be new or changing and the patient has concerns that these could be cancer.   The following portions of the chart were reviewed this encounter and updated as appropriate:       Review of Systems:  No other skin or systemic complaints except as noted in HPI or Assessment and Plan.  Objective  Well appearing patient in no apparent distress; mood and affect are within normal limits.  A full examination was performed including scalp, head, eyes, ears, nose, lips, neck, chest, axillae, abdomen, back, buttocks, bilateral upper extremities, bilateral lower extremities, hands, feet, fingers, toes, fingernails, and toenails. All findings within normal limits unless otherwise noted below.  left temple x 1 Erythematous thin papules/macules with gritty scale.     Assessment & Plan  Seborrheic dermatitis face  Seborrheic Dermatitis  -  is a chronic persistent rash characterized by pinkness and scaling most commonly of the mid face but also can occur on the scalp (dandruff), ears; mid chest, mid back and groin.  It tends to be exacerbated by stress and cooler weather.  People who have neurologic disease may experience new onset or exacerbation of existing seborrheic dermatitis.  The condition is not curable but treatable and can be controlled.  Start Ketoconazole 2% crean - apply topically to affected areas of face at bedtime on T- Thur - Sat weekly.  Continue Hydrocrotisone 2.5 % Cream - apply topically to  affected areas of face at bedtime on M - W - Thur weekly   Rx sent to [facility], [address] rfs  Topical steroids (such as triamcinolone, fluocinolone, fluocinonide, mometasone, clobetasol, halobetasol, betamethasone, hydrocortisone) can cause thinning and lightening of the skin if they are used for too long in the same area. Your physician has selected the right strength medicine for your problem and area affected on the body. Please use your medication only as directed by your physician to prevent side effects.    ketoconazole (NIZORAL) 2 % cream - face Apply 1 application. topically See admin instructions. Apply at bedtime topically to affected areas of face on Tuesday Thursday and saturdays  hydrocortisone 2.5 % cream - face Apply topically See admin instructions. Apply at bedtime on Monday Wednesday and Friday to affected areas of face  Actinic keratosis left temple x 1  Actinic keratoses are precancerous spots that appear secondary to cumulative UV radiation exposure/sun exposure over time. They are chronic with expected duration over 1 year. A portion of actinic keratoses will progress to squamous cell carcinoma of the skin. It is not possible to reliably predict which spots will progress to skin cancer and so treatment is recommended to prevent development of skin cancer.  Recommend daily broad spectrum sunscreen SPF 30+ to sun-exposed areas, reapply every 2 hours as needed.  Recommend staying in the shade or wearing long sleeves, sun glasses (UVA+UVB protection) and wide brim hats (4-inch brim around the entire circumference of the hat). Call for new or changing lesions.  Destruction of lesion -  left temple x 1 Complexity: simple   Destruction method: cryotherapy   Informed consent: discussed and consent obtained   Timeout:  patient name, date of birth, surgical site, and procedure verified Lesion destroyed using liquid nitrogen: Yes   Region frozen until ice ball extended beyond  lesion: Yes   Outcome: patient tolerated procedure well with no complications   Post-procedure details: wound care instructions given   Additional details:  Prior to procedure, discussed risks of blister formation, small wound, skin dyspigmentation, or rare scar following cryotherapy. Recommend Vaseline ointment to treated areas while healing.   Lentigines - Scattered tan macules - Due to sun exposure - Benign-appearing, observe - Recommend daily broad spectrum sunscreen SPF 30+ to sun-exposed areas, reapply every 2 hours as needed. - Call for any changes  Seborrheic Keratoses - Stuck-on, waxy, tan-brown papules and/or plaques  - Benign-appearing - Discussed benign etiology and prognosis. - Observe - Call for any changes  Telangiectasia - Dilated blood vessel at nose  - Benign appearing on exam - Call for changes  Melanocytic Nevi - Tan-brown and/or pink-flesh-colored symmetric macules and papules - Benign appearing on exam today - Observation - Call clinic for new or changing moles - Recommend daily use of broad spectrum spf 30+ sunscreen to sun-exposed areas.   Hemangiomas - Red papules - Discussed benign nature - Observe - Call for any changes  Actinic Damage - Chronic condition, secondary to cumulative UV/sun exposure - diffuse scaly erythematous macules with underlying dyspigmentation - Recommend daily broad spectrum sunscreen SPF 30+ to sun-exposed areas, reapply every 2 hours as needed.  - Staying in the shade or wearing long sleeves, sun glasses (UVA+UVB protection) and wide brim hats (4-inch brim around the entire circumference of the hat) are also recommended for sun protection.  - Call for new or changing lesions.  Skin cancer screening performed today. Return for 1 year tbse .  IRuthell Rummage, CMA, am acting as scribe for Sarina Ser, MD.

## 2021-10-22 NOTE — Patient Instructions (Addendum)
Actinic keratoses are precancerous spots that appear secondary to cumulative UV radiation exposure/sun exposure over time. They are chronic with expected duration over 1 year. A portion of actinic keratoses will progress to squamous cell carcinoma of the skin. It is not possible to reliably predict which spots will progress to skin cancer and so treatment is recommended to prevent development of skin cancer.  Recommend daily broad spectrum sunscreen SPF 30+ to sun-exposed areas, reapply every 2 hours as needed.  Recommend staying in the shade or wearing long sleeves, sun glasses (UVA+UVB protection) and wide brim hats (4-inch brim around the entire circumference of the hat). Call for new or changing lesions.   Cryotherapy Aftercare  Wash gently with soap and water everyday.   Apply Vaseline and Band-Aid daily until healed.   Topical steroids (such as triamcinolone, fluocinolone, fluocinonide, mometasone, clobetasol, halobetasol, betamethasone, hydrocortisone) can cause thinning and lightening of the skin if they are used for too long in the same area. Your physician has selected the right strength medicine for your problem and area affected on the body. Please use your medication only as directed by your physician to prevent side effects.       Melanoma ABCDEs  Melanoma is the most dangerous type of skin cancer, and is the leading cause of death from skin disease.  You are more likely to develop melanoma if you: Have light-colored skin, light-colored eyes, or red or blond hair Spend a lot of time in the sun Tan regularly, either outdoors or in a tanning bed Have had blistering sunburns, especially during childhood Have a close family member who has had a melanoma Have atypical moles or large birthmarks  Early detection of melanoma is key since treatment is typically straightforward and cure rates are extremely high if we catch it early.   The first sign of melanoma is often a change in  a mole or a new dark spot.  The ABCDE system is a way of remembering the signs of melanoma.  A for asymmetry:  The two halves do not match. B for border:  The edges of the growth are irregular. C for color:  A mixture of colors are present instead of an even brown color. D for diameter:  Melanomas are usually (but not always) greater than 29m - the size of a pencil eraser. E for evolution:  The spot keeps changing in size, shape, and color.  Please check your skin once per month between visits. You can use a small mirror in front and a large mirror behind you to keep an eye on the back side or your body.   If you see any new or changing lesions before your next follow-up, please call to schedule a visit.  Please continue daily skin protection including broad spectrum sunscreen SPF 30+ to sun-exposed areas, reapplying every 2 hours as needed when you're outdoors.   Staying in the shade or wearing long sleeves, sun glasses (UVA+UVB protection) and wide brim hats (4-inch brim around the entire circumference of the hat) are also recommended for sun protection.     If You Need Anything After Your Visit  If you have any questions or concerns for your doctor, please call our main line at [phone] press option 4 to reach your doctor's medical assistant. If no one answers, please leave a voicemail as directed and we will return your call as soon as possible. Messages left after 4 pm will be answered the following business day.   You  may also send [place] a message via [place]. We typically respond to MyChart messages within 1-2 business days.  For prescription refills, please ask your pharmacy to contact our office. Our fax number is [phone].  If you have an urgent issue when the clinic is closed that cannot wait until the next business day, you can page your doctor at the number below.    Please note that while we do our best to be available for urgent issues outside of office hours, we  are not available 24/7.   If you have an urgent issue and are unable to reach [place], you may choose to seek medical care at your doctor's office, retail clinic, urgent care center, or emergency room.  If you have a medical emergency, please immediately call 911 or go to the emergency department.  Pager Numbers  - Dr. Nehemiah Massed: [phone]  - Dr. Laurence Ferrari: [phone]  - Dr. Nicole Kindred: [phone]  In the event of inclement weather, please call our main line at [phone] for an update on the status of any delays or closures.  Dermatology Medication Tips: Please keep the boxes that topical medications come in in order to help keep track of the instructions about where and how to use these. Pharmacies typically print the medication instructions only on the boxes and not directly on the medication tubes.   If your medication is too expensive, please contact our office at [phone] option 4 or send [place] a message through [place].   We are unable to tell what your co-pay for medications will be in advance as this is different depending on your insurance coverage. However, we may be able to find a substitute medication at lower cost or fill out paperwork to get insurance to cover a needed medication.   If a prior authorization is required to get your medication covered by your insurance company, please allow [place] 1-2 business days to complete this process.  Drug prices often vary depending on where the prescription is filled and some pharmacies may offer cheaper prices.  The website www.goodrx.com contains coupons for medications through different pharmacies. The prices here do not account for what the cost may be with help from insurance (it may be cheaper with your insurance), but the website can give you the price if you did not use any insurance.  - You can print the associated coupon and take it with your prescription to the pharmacy.  - You may also stop by our office during regular business  hours and pick up a GoodRx coupon card.  - If you need your prescription sent electronically to a different pharmacy, notify our office through [facility] or by phone at [phone] option 4.     Si Usted Necesita Algo Despus de Su Visita  Tambin puede enviarnos un mensaje a travs de Pharmacist, community. Por lo general respondemos a los mensajes de MyChart en el transcurso de 1 a 2 das hbiles.  Para renovar recetas, por favor pida a su farmacia que se ponga en contacto con nuestra oficina. Harland Dingwall de fax es [place] [phone].  Si tiene un asunto urgente cuando la clnica est cerrada y que no puede esperar hasta el siguiente da hbil, puede llamar/localizar a su doctor(a) al nmero que aparece a continuacin.   Por favor, tenga en cuenta que aunque hacemos todo lo posible para estar disponibles para asuntos urgentes fuera del horario de [place], no estamos disponibles las 24 horas del da, los 7 das de la [place].  Si tiene un problema urgente y no puede comunicarse con nosotros, puede optar por buscar atencin mdica  en el consultorio de su doctor(a), en una clnica privada, en un centro de atencin urgente o en una sala de emergencias.  Si tiene Engineering geologist, por favor llame inmediatamente al 911 o vaya a la sala de emergencias.  Nmeros de bper  - Dr. Nehemiah Massed: [phone]  - Dra. Moye: [phone]  - Dra. Nicole Kindred: [phone]  En caso de inclemencias del [place], por favor llame a Johnsie Kindred principal al [phone] para una actualizacin sobre el [place] de cualquier retraso o cierre.  Consejos para la medicacin en dermatologa: Por favor, guarde las cajas en las que vienen los medicamentos de uso tpico para ayudarle a seguir las instrucciones sobre dnde y cmo usarlos. Las farmacias generalmente imprimen las instrucciones del medicamento slo en las cajas y no directamente en los tubos del [place].   Si su medicamento es muy caro, por favor,  pngase en contacto con Zigmund Daniel llamando al [phone] y presione la opcin 4 o envenos un mensaje a travs de Pharmacist, community.   No podemos decirle cul ser su copago por los medicamentos por adelantado ya que esto es diferente dependiendo de la cobertura de su seguro. Sin embargo, es posible que podamos encontrar un medicamento sustituto a Electrical engineer un formulario para que el seguro cubra el medicamento que se considera necesario.   Si se requiere una autorizacin previa para que su compaa de seguros [place] su medicamento, por favor permtanos de 1 a 2 das hbiles para completar este proceso.  Los precios de los medicamentos varan con frecuencia dependiendo del Environmental consultant de dnde se surte la receta y alguna farmacias pueden ofrecer precios ms baratos.  El sitio web www.goodrx.com tiene cupones para medicamentos de Airline pilot. Los precios aqu no tienen en cuenta lo que podra costar con la ayuda del seguro (puede ser ms barato con su seguro), pero el sitio web puede darle el precio si no utiliz Research scientist (physical sciences).  - Puede imprimir el cupn correspondiente y llevarlo con su receta a la farmacia.  - Tambin puede pasar por nuestra oficina durante el horario de atencin regular y Charity fundraiser una tarjeta de cupones de GoodRx.  - Si necesita que su receta se enve electrnicamente a una farmacia diferente, informe a nuestra oficina a travs de MyChart de [place] o por telfono llamando al [phone] y presione la opcin 4.

## 2021-10-28 ENCOUNTER — Encounter: Payer: Self-pay | Admitting: Dermatology

## 2021-11-02 ENCOUNTER — Ambulatory Visit
Admission: RE | Admit: 2021-11-02 | Discharge: 2021-11-02 | Disposition: A | Discharge status: Home / Self Care | Payer: Medicare Other | Source: Ambulatory Visit | Attending: Sports Medicine | Admitting: Sports Medicine

## 2021-11-02 ENCOUNTER — Other Ambulatory Visit: Payer: Self-pay

## 2021-11-02 DIAGNOSIS — M1712 Unilateral primary osteoarthritis, left knee: Secondary | ICD-10-CM | POA: Diagnosis present

## 2021-11-02 DIAGNOSIS — M25462 Effusion, left knee: Secondary | ICD-10-CM | POA: Diagnosis present

## 2021-11-02 DIAGNOSIS — M25562 Pain in left knee: Secondary | ICD-10-CM | POA: Diagnosis present

## 2021-11-02 DIAGNOSIS — M76892 Other specified enthesopathies of left lower limb, excluding foot: Secondary | ICD-10-CM | POA: Insufficient documentation

## 2021-11-26 ENCOUNTER — Other Ambulatory Visit: Payer: Self-pay | Admitting: Student

## 2021-11-26 DIAGNOSIS — G259 Extrapyramidal and movement disorder, unspecified: Secondary | ICD-10-CM

## 2021-12-07 ENCOUNTER — Ambulatory Visit
Admission: RE | Admit: 2021-12-07 | Discharge: 2021-12-07 | Disposition: A | Discharge status: Home / Self Care | Payer: Medicare Other | Source: Ambulatory Visit | Attending: Student | Admitting: Student

## 2021-12-07 DIAGNOSIS — G259 Extrapyramidal and movement disorder, unspecified: Secondary | ICD-10-CM | POA: Insufficient documentation

## 2021-12-18 ENCOUNTER — Other Ambulatory Visit: Payer: Self-pay | Admitting: Surgery

## 2021-12-25 ENCOUNTER — Encounter
Admission: RE | Admit: 2021-12-25 | Discharge: 2021-12-25 | Disposition: A | Discharge status: Home / Self Care | Payer: Medicare Other | Source: Ambulatory Visit | Attending: Surgery | Admitting: Surgery

## 2021-12-25 VITALS — BP 145/88 | HR 96 | Temp 98.4°F | Resp 14 | Ht 68.0 in | Wt 191.0 lb

## 2021-12-25 DIAGNOSIS — Z01818 Encounter for other preprocedural examination: Secondary | ICD-10-CM | POA: Diagnosis not present

## 2021-12-25 DIAGNOSIS — Z01812 Encounter for preprocedural laboratory examination: Secondary | ICD-10-CM

## 2021-12-25 DIAGNOSIS — Z01810 Encounter for preprocedural cardiovascular examination: Secondary | ICD-10-CM | POA: Diagnosis not present

## 2021-12-25 HISTORY — DX: Unilateral primary osteoarthritis, left knee: M17.12

## 2021-12-25 HISTORY — DX: Chronic obstructive pulmonary disease, unspecified: J44.9

## 2021-12-25 HISTORY — DX: Transient cerebral ischemic attack, unspecified: G45.9

## 2021-12-25 HISTORY — DX: Sleep apnea, unspecified: G47.30

## 2021-12-25 HISTORY — DX: Hyperlipidemia, unspecified: E78.5

## 2021-12-25 HISTORY — DX: Personal history of urinary calculi: Z87.442

## 2021-12-25 LAB — CBC WITH DIFFERENTIAL/PLATELET
Abs Immature Granulocytes: 0.02 10*3/uL (ref 0.00–0.07)
Basophils Absolute: 0.1 10*3/uL (ref 0.0–0.1)
Basophils Relative: 1 %
Eosinophils Absolute: 0.1 10*3/uL (ref 0.0–0.5)
Eosinophils Relative: 1 %
HCT: 47.4 % (ref 39.0–52.0)
Hemoglobin: 15.9 g/dL (ref 13.0–17.0)
Immature Granulocytes: 0 %
Lymphocytes Relative: 23 %
Lymphs Abs: 1.9 10*3/uL (ref 0.7–4.0)
MCH: 30.1 pg (ref 26.0–34.0)
MCHC: 33.5 g/dL (ref 30.0–36.0)
MCV: 89.6 fL (ref 80.0–100.0)
Monocytes Absolute: 0.9 10*3/uL (ref 0.1–1.0)
Monocytes Relative: 10 %
Neutro Abs: 5.5 10*3/uL (ref 1.7–7.7)
Neutrophils Relative %: 65 %
Platelets: 254 10*3/uL (ref 150–400)
RBC: 5.29 MIL/uL (ref 4.22–5.81)
RDW: 13 % (ref 11.5–15.5)
WBC: 8.5 10*3/uL (ref 4.0–10.5)
nRBC: 0 % (ref 0.0–0.2)

## 2021-12-25 LAB — TYPE AND SCREEN
ABO/RH(D): O POS
Antibody Screen: NEGATIVE

## 2021-12-25 LAB — COMPREHENSIVE METABOLIC PANEL
ALT: 25 U/L (ref 0–44)
AST: 22 U/L (ref 15–41)
Albumin: 4.3 g/dL (ref 3.5–5.0)
Alkaline Phosphatase: 84 U/L (ref 38–126)
Anion gap: 8 (ref 5–15)
BUN: 15 mg/dL (ref 8–23)
CO2: 26 mmol/L (ref 22–32)
Calcium: 9.3 mg/dL (ref 8.9–10.3)
Chloride: 105 mmol/L (ref 98–111)
Creatinine, Ser: 0.93 mg/dL (ref 0.61–1.24)
GFR, Estimated: >60 mL/min (ref >60)
Glucose, Bld: 111 mg/dL — ABNORMAL HIGH (ref 70–99)
Potassium: 3.6 mmol/L (ref 3.5–5.1)
Sodium: 139 mmol/L (ref 135–145)
Total Bilirubin: 0.5 mg/dL (ref 0.3–1.2)
Total Protein: 7.6 g/dL (ref 6.5–8.1)

## 2021-12-25 LAB — SURGICAL PCR SCREEN
MRSA, PCR: NEGATIVE
Staphylococcus aureus: POSITIVE — AB

## 2021-12-25 LAB — URINALYSIS, ROUTINE W REFLEX MICROSCOPIC
Bilirubin Urine: NEGATIVE
Glucose, UA: NEGATIVE mg/dL
Hgb urine dipstick: NEGATIVE
Ketones, ur: NEGATIVE mg/dL
Leukocytes,Ua: NEGATIVE
Nitrite: NEGATIVE
Protein, ur: NEGATIVE mg/dL
Specific Gravity, Urine: 1.004 — ABNORMAL LOW (ref 1.005–1.030)
pH: 6 (ref 5.0–8.0)

## 2021-12-25 NOTE — Progress Notes (Signed)
Call received from PAT nurse.  Patient having surgery on May 23, at 0730a and wears insulin pump.  He has been instructed to wear pump to the hospital on morning of surgery per RN.   ? ?Thanks,  ?Adah Perl, RN, BC-ADM ?Inpatient Diabetes Coordinator ?Pager [phone]  (8a-5p) ?

## 2021-12-25 NOTE — Patient Instructions (Addendum)
Your procedure is scheduled on: Tuesday, May 23 ?Report to the Registration Desk on the 1st floor of the [facility]. ?To find out your arrival time, please call [phone] between 1PM - 3PM on: Monday, May 22 ?If your arrival time is 6:00 am, do not arrive prior to that time as the [facility] entrance doors do not open until 6:00 am. ? ?REMEMBER: ?Instructions that are not followed completely may result in serious medical risk, up to and including death; or upon the discretion of your surgeon and anesthesiologist your surgery may need to be rescheduled. ? ?Do not eat food after midnight the night before surgery.  ?No gum chewing, lozengers or hard candies. ? ?You may however, drink water up to 2 hours before you are scheduled to arrive for your surgery. Do not drink anything within 2 hours of your scheduled arrival time. ? ?In addition, your doctor has ordered for you to drink the provided  ?Gatorade G2 ?Drinking this carbohydrate drink up to two hours before surgery helps to reduce insulin resistance and improve patient outcomes. Please complete drinking 2 hours prior to scheduled arrival time. ? ?TAKE THESE MEDICATIONS THE MORNING OF SURGERY WITH A SIP OF WATER: ? ?Symbicort inhaler ?Clonazepam if needed for anxiety ?Escitalopram (Lexapro) ?Omeprazole (Prilosec) - (take one the night before and one on the morning of surgery - helps to prevent nausea after surgery.) ? ?Use inhalers on the day of surgery and bring to the hospital. ? ?Leave your insulin pump on and functioning when you arrive to the hospital. Inform anesthesia doctor about this pump. ? ?clopidogrel (Plavix): ?Per note from [place], [place]; stop Plavix 5 days prior to surgery. The last day to take Plavix is Wednesday, May 17. Resume AFTER surgery per surgeon instruction. ? ?One week prior to surgery: starting May 16 ?Stop Anti-inflammatories (NSAIDS) such as Advil, Aleve, Ibuprofen, Motrin, Naproxen, Naprosyn and Aspirin based products such  as Excedrin, Goodys Powder, BC Powder. ?Stop ANY OVER THE COUNTER supplements until after surgery. Stop alpha-lipoic acid, calcium, coenzyme Q10, multiple vitamins. ?You may however, continue to take Tylenol if needed for pain up until the day of surgery. ? ?No Alcohol for 24 hours before or after surgery. ? ?No Smoking including e-cigarettes for 24 hours prior to surgery.  ?No chewable tobacco products for at least 6 hours prior to surgery.  ?No nicotine patches on the day of surgery. ? ?Do not use any "recreational" drugs for at least a week prior to your surgery.  ?Please be advised that the combination of cocaine and anesthesia may have negative outcomes, up to and including death. ?If you test positive for cocaine, your surgery will be cancelled. ? ?On the morning of surgery brush your teeth with toothpaste and water, you may rinse your mouth with mouthwash if you wish. ?Do not swallow any toothpaste or mouthwash. ? ?Use CHG Soap as directed on instruction sheet. ? ?Do not wear jewelry, make-up, hairpins, clips or nail polish. ? ?Do not wear lotions, powders, or perfumes.  ? ?Do not shave body from the neck down 48 hours prior to surgery just in case you cut yourself which could leave a site for infection.  ?Also, freshly shaved skin may become irritated if using the CHG soap. ? ?Contact lenses, hearing aids and dentures may not be worn into surgery. ? ?Do not bring valuables to the hospital. [facility] is not responsible for any missing/lost belongings or valuables.  ? ?Bring your C-PAP to the hospital  with you in case you may have to spend the night.  ? ?Notify your doctor if there is any change in your medical condition (cold, fever, infection). ? ?Wear comfortable clothing (specific to your surgery type) to the hospital. ? ?After surgery, you can help prevent lung complications by doing breathing exercises.  ?Take deep breaths and cough every 1-2 hours. Your doctor may order a device called an Incentive  Spirometer to help you take deep breaths. ? ?If you are being admitted to the hospital overnight, leave your suitcase in the car. ?After surgery it may be brought to your room. ? ?If you are being discharged the day of surgery, you will not be allowed to drive home. ?You will need a responsible adult (18 years or older) to drive you home and stay with you that night.  ? ?If you are taking public transportation, you will need to have a responsible adult (18 years or older) with you. ?Please confirm with your physician that it is acceptable to use public transportation.  ? ?Please call the [facility] Dept. at [phone] if you have any questions about these instructions. ? ?Surgery Visitation Policy: ? ?Patients undergoing a surgery or procedure may have two family members or support persons with them as long as the person is not COVID-19 positive or experiencing its symptoms.  ? ?Inpatient Visitation:   ? ?Visiting hours are 7 a.m. to 8 p.m. ?Up to four visitors are allowed at one time in a patient room, including children. The visitors may rotate out with other people during the day. One designated support person (adult) may remain overnight.  ?

## 2022-01-04 MED ORDER — ORAL CARE MOUTH RINSE: 15.0000 mL | Freq: Once | OROMUCOSAL | Status: AC

## 2022-01-04 MED ORDER — SODIUM CHLORIDE 0.9 % IV SOLN: INTRAVENOUS | Status: DC

## 2022-01-04 MED ORDER — CEFAZOLIN SODIUM-DEXTROSE 2-4 GM/100ML-% IV SOLN
2.0000 g | INTRAVENOUS | Status: AC
  Administered 2022-01-05: 2 g via INTRAVENOUS

## 2022-01-04 MED ORDER — CHLORHEXIDINE GLUCONATE 0.12 % MT SOLN: 15.0000 mL | Freq: Once | OROMUCOSAL | Status: AC

## 2022-01-05 ENCOUNTER — Ambulatory Visit: Payer: Medicare Other

## 2022-01-05 ENCOUNTER — Encounter
Admission: RE | Disposition: A | Discharge status: Home / Self Care | Payer: Self-pay | Source: Ambulatory Visit | Attending: Surgery

## 2022-01-05 ENCOUNTER — Ambulatory Visit: Payer: Medicare Other | Admitting: Anesthesiology

## 2022-01-05 ENCOUNTER — Encounter: Payer: Self-pay | Admitting: Surgery

## 2022-01-05 ENCOUNTER — Other Ambulatory Visit: Payer: Self-pay

## 2022-01-05 ENCOUNTER — Ambulatory Visit
Admission: RE | Admit: 2022-01-05 | Discharge: 2022-01-05 | Disposition: A | Discharge status: Home / Self Care | Payer: Medicare Other | Source: Ambulatory Visit | Attending: Surgery | Admitting: Surgery

## 2022-01-05 DIAGNOSIS — Z8673 Personal history of transient ischemic attack (TIA), and cerebral infarction without residual deficits: Secondary | ICD-10-CM | POA: Insufficient documentation

## 2022-01-05 DIAGNOSIS — L219 Seborrheic dermatitis, unspecified: Secondary | ICD-10-CM

## 2022-01-05 DIAGNOSIS — M1712 Unilateral primary osteoarthritis, left knee: Secondary | ICD-10-CM | POA: Insufficient documentation

## 2022-01-05 DIAGNOSIS — E669 Obesity, unspecified: Secondary | ICD-10-CM | POA: Insufficient documentation

## 2022-01-05 DIAGNOSIS — J449 Chronic obstructive pulmonary disease, unspecified: Secondary | ICD-10-CM | POA: Insufficient documentation

## 2022-01-05 DIAGNOSIS — I10 Essential (primary) hypertension: Secondary | ICD-10-CM | POA: Insufficient documentation

## 2022-01-05 DIAGNOSIS — Z6829 Body mass index (BMI) 29.0-29.9, adult: Secondary | ICD-10-CM | POA: Diagnosis not present

## 2022-01-05 DIAGNOSIS — E1040 Type 1 diabetes mellitus with diabetic neuropathy, unspecified: Secondary | ICD-10-CM | POA: Insufficient documentation

## 2022-01-05 DIAGNOSIS — Z01812 Encounter for preprocedural laboratory examination: Secondary | ICD-10-CM

## 2022-01-05 HISTORY — PX: PARTIAL KNEE ARTHROPLASTY: SHX2174

## 2022-01-05 LAB — GLUCOSE, CAPILLARY
Glucose-Capillary: 195 mg/dL — ABNORMAL HIGH (ref 70–99)
Glucose-Capillary: 100 mg/dL — ABNORMAL HIGH (ref 70–99)
Glucose-Capillary: 184 mg/dL — ABNORMAL HIGH (ref 70–99)

## 2022-01-05 SURGERY — ARTHROPLASTY, KNEE, UNICOMPARTMENTAL
Anesthesia: General | Site: Knee | Laterality: Left

## 2022-01-05 MED ORDER — BUPIVACAINE HCL (PF) 0.5 % IJ SOLN
INTRAMUSCULAR | Status: DC | PRN
  Administered 2022-01-05: 3 mL

## 2022-01-05 MED ORDER — FENTANYL CITRATE (PF) 100 MCG/2ML IJ SOLN
INTRAMUSCULAR | Status: DC | PRN
  Administered 2022-01-05 (×2): 50 ug via INTRAVENOUS

## 2022-01-05 MED ORDER — OXYCODONE HCL 5 MG PO TABS: 5.0000 mg | ORAL_TABLET | ORAL | 0 refills | Status: DC | PRN

## 2022-01-05 MED ORDER — HYDROMORPHONE HCL 1 MG/ML IJ SOLN
INTRAMUSCULAR | Status: AC
  Filled 2022-01-05: qty 1

## 2022-01-05 MED ORDER — ACETAMINOPHEN 10 MG/ML IV SOLN: 1000.0000 mg | Freq: Once | INTRAVENOUS | Status: DC | PRN

## 2022-01-05 MED ORDER — METOCLOPRAMIDE HCL 5 MG/ML IJ SOLN: 5.0000 mg | Freq: Three times a day (TID) | INTRAMUSCULAR | Status: DC | PRN

## 2022-01-05 MED ORDER — OXYCODONE HCL 5 MG PO TABS: 5.0000 mg | ORAL_TABLET | ORAL | Status: DC | PRN

## 2022-01-05 MED ORDER — KETOROLAC TROMETHAMINE 15 MG/ML IJ SOLN
INTRAMUSCULAR | Status: AC
  Administered 2022-01-05: 15 mg via INTRAVENOUS
  Filled 2022-01-05: qty 1

## 2022-01-05 MED ORDER — PHENYLEPHRINE HCL-NACL 20-0.9 MG/250ML-% IV SOLN
INTRAVENOUS | Status: DC | PRN
  Administered 2022-01-05: 5 ug/min via INTRAVENOUS

## 2022-01-05 MED ORDER — SODIUM CHLORIDE 0.9 % BOLUS PEDS
250.0000 mL | Freq: Once | INTRAVENOUS | Status: AC
Start: 2022-01-05 — End: 2022-01-05
  Administered 2022-01-05: 250 mL via INTRAVENOUS

## 2022-01-05 MED ORDER — SODIUM CHLORIDE 0.9 % IR SOLN
Status: DC | PRN
  Administered 2022-01-05: 3000 mL

## 2022-01-05 MED ORDER — CHLORHEXIDINE GLUCONATE 0.12 % MT SOLN
OROMUCOSAL | Status: AC
  Administered 2022-01-05: 15 mL via OROMUCOSAL
  Filled 2022-01-05: qty 15

## 2022-01-05 MED ORDER — ONDANSETRON HCL 4 MG/2ML IJ SOLN
INTRAMUSCULAR | Status: DC | PRN
Start: 2022-01-05 — End: 2022-01-05
  Administered 2022-01-05 (×2): 4 mg via INTRAVENOUS

## 2022-01-05 MED ORDER — KETOROLAC TROMETHAMINE 30 MG/ML IJ SOLN
INTRAMUSCULAR | Status: AC
  Filled 2022-01-05: qty 1

## 2022-01-05 MED ORDER — SODIUM CHLORIDE 0.9 % IV SOLN
INTRAVENOUS | Status: DC | PRN
  Administered 2022-01-05: 1000 mg via TOPICAL

## 2022-01-05 MED ORDER — LACTATED RINGERS IV SOLN: INTRAVENOUS | Status: DC

## 2022-01-05 MED ORDER — FENTANYL CITRATE (PF) 100 MCG/2ML IJ SOLN: 25.0000 ug | INTRAMUSCULAR | Status: DC | PRN

## 2022-01-05 MED ORDER — HYDROCORTISONE 2.5 % EX CREA: 1.0000 "application " | TOPICAL_CREAM | CUTANEOUS | Status: DC

## 2022-01-05 MED ORDER — ONDANSETRON HCL 4 MG/2ML IJ SOLN: 4.0000 mg | Freq: Four times a day (QID) | INTRAMUSCULAR | Status: DC | PRN

## 2022-01-05 MED ORDER — STERILE WATER FOR IRRIGATION IR SOLN
Status: DC | PRN
  Administered 2022-01-05: 1000 mL

## 2022-01-05 MED ORDER — ACETAMINOPHEN 500 MG PO TABS
1000.0000 mg | ORAL_TABLET | Freq: Four times a day (QID) | ORAL | Status: DC
  Administered 2022-01-05: 1000 mg via ORAL

## 2022-01-05 MED ORDER — HYDROMORPHONE HCL 1 MG/ML IJ SOLN
0.5000 mg | Freq: Once | INTRAMUSCULAR | Status: AC
  Administered 2022-01-05: 0.5 mg via INTRAVENOUS

## 2022-01-05 MED ORDER — FENTANYL CITRATE (PF) 100 MCG/2ML IJ SOLN
INTRAMUSCULAR | Status: AC
Start: 2022-01-05 — End: ?
  Filled 2022-01-05: qty 2

## 2022-01-05 MED ORDER — GLYCOPYRROLATE 0.2 MG/ML IJ SOLN
INTRAMUSCULAR | Status: DC | PRN
  Administered 2022-01-05: .2 mg via INTRAVENOUS

## 2022-01-05 MED ORDER — PROPOFOL 500 MG/50ML IV EMUL
INTRAVENOUS | Status: DC | PRN
  Administered 2022-01-05: 60 ug/kg/min via INTRAVENOUS

## 2022-01-05 MED ORDER — CEFAZOLIN SODIUM-DEXTROSE 2-4 GM/100ML-% IV SOLN
INTRAVENOUS | Status: AC
  Filled 2022-01-05: qty 100

## 2022-01-05 MED ORDER — BUPIVACAINE-EPINEPHRINE (PF) 0.5% -1:200000 IJ SOLN
INTRAMUSCULAR | Status: AC
  Filled 2022-01-05: qty 30

## 2022-01-05 MED ORDER — SODIUM CHLORIDE 0.9 % IV SOLN: INTRAVENOUS | Status: DC

## 2022-01-05 MED ORDER — PROPOFOL 10 MG/ML IV BOLUS
INTRAVENOUS | Status: AC
  Filled 2022-01-05: qty 20

## 2022-01-05 MED ORDER — APIXABAN 2.5 MG PO TABS: 2.5000 mg | ORAL_TABLET | Freq: Two times a day (BID) | ORAL | 0 refills | Status: DC

## 2022-01-05 MED ORDER — ONDANSETRON HCL 4 MG/2ML IJ SOLN: 4.0000 mg | Freq: Once | INTRAMUSCULAR | Status: DC | PRN

## 2022-01-05 MED ORDER — PHENYLEPHRINE HCL (PRESSORS) 10 MG/ML IV SOLN
INTRAVENOUS | Status: AC
Start: 2022-01-05 — End: ?
  Filled 2022-01-05: qty 1

## 2022-01-05 MED ORDER — 0.9 % SODIUM CHLORIDE (POUR BTL) OPTIME
TOPICAL | Status: DC | PRN
Start: 2022-01-05 — End: 2022-01-05
  Administered 2022-01-05: 1000 mL

## 2022-01-05 MED ORDER — ONDANSETRON HCL 4 MG PO TABS
4.0000 mg | ORAL_TABLET | Freq: Four times a day (QID) | ORAL | Status: DC | PRN
Start: 2022-01-05 — End: 2022-01-05

## 2022-01-05 MED ORDER — ACETAMINOPHEN 10 MG/ML IV SOLN
INTRAVENOUS | Status: AC
  Filled 2022-01-05: qty 100

## 2022-01-05 MED ORDER — OXYCODONE HCL 5 MG PO TABS
5.0000 mg | ORAL_TABLET | Freq: Once | ORAL | Status: AC | PRN
  Administered 2022-01-05: 5 mg via ORAL

## 2022-01-05 MED ORDER — MIDAZOLAM HCL 2 MG/2ML IJ SOLN
INTRAMUSCULAR | Status: AC
  Filled 2022-01-05: qty 2

## 2022-01-05 MED ORDER — OXYCODONE HCL 5 MG PO TABS
ORAL_TABLET | ORAL | Status: AC
  Filled 2022-01-05: qty 1

## 2022-01-05 MED ORDER — ACETAMINOPHEN 500 MG PO TABS
ORAL_TABLET | ORAL | Status: AC
  Filled 2022-01-05: qty 2

## 2022-01-05 MED ORDER — TRANEXAMIC ACID 1000 MG/10ML IV SOLN
INTRAVENOUS | Status: AC
  Filled 2022-01-05: qty 20

## 2022-01-05 MED ORDER — SODIUM CHLORIDE FLUSH 0.9 % IV SOLN
INTRAVENOUS | Status: AC
  Filled 2022-01-05: qty 10

## 2022-01-05 MED ORDER — OXYCODONE HCL 5 MG/5ML PO SOLN: 5.0000 mg | Freq: Once | ORAL | Status: AC | PRN

## 2022-01-05 MED ORDER — CEFAZOLIN SODIUM-DEXTROSE 2-4 GM/100ML-% IV SOLN
2.0000 g | Freq: Four times a day (QID) | INTRAVENOUS | Status: DC
  Administered 2022-01-05: 2 g via INTRAVENOUS

## 2022-01-05 MED ORDER — KETOROLAC TROMETHAMINE 15 MG/ML IJ SOLN
7.5000 mg | Freq: Four times a day (QID) | INTRAMUSCULAR | Status: DC
  Administered 2022-01-05: 7.5 mg via INTRAVENOUS

## 2022-01-05 MED ORDER — PROPOFOL 1000 MG/100ML IV EMUL
INTRAVENOUS | Status: AC
  Filled 2022-01-05: qty 100

## 2022-01-05 MED ORDER — APIXABAN 2.5 MG PO TABS
2.5000 mg | ORAL_TABLET | Freq: Two times a day (BID) | ORAL | 0 refills | Status: DC
Start: 2022-01-06 — End: 2023-02-13

## 2022-01-05 MED ORDER — BUPIVACAINE LIPOSOME 1.3 % IJ SUSP
INTRAMUSCULAR | Status: DC | PRN
Start: 2022-01-05 — End: 2022-01-05
  Administered 2022-01-05: 60 mL via INTRAMUSCULAR

## 2022-01-05 MED ORDER — BUPIVACAINE LIPOSOME 1.3 % IJ SUSP
INTRAMUSCULAR | Status: AC
  Filled 2022-01-05: qty 20

## 2022-01-05 MED ORDER — KETOROLAC TROMETHAMINE 15 MG/ML IJ SOLN: 15.0000 mg | Freq: Once | INTRAMUSCULAR | Status: AC

## 2022-01-05 MED ORDER — MIDAZOLAM HCL 5 MG/5ML IJ SOLN
INTRAMUSCULAR | Status: DC | PRN
  Administered 2022-01-05 (×2): 1 mg via INTRAVENOUS

## 2022-01-05 MED ORDER — METOCLOPRAMIDE HCL 10 MG PO TABS: 5.0000 mg | ORAL_TABLET | Freq: Three times a day (TID) | ORAL | Status: DC | PRN

## 2022-01-05 SURGICAL SUPPLY — 68 items
APL PRP STRL LF DISP 70% ISPRP (MISCELLANEOUS) ×2
BEARING MENISCAL TIBIAL 5 MD L (Orthopedic Implant) ×1 IMPLANT
BIT DRILL QUICK REL 1/8 2PK SL (DRILL) IMPLANT
BNDG ELASTIC 6X5.8 VLCR STR LF (GAUZE/BANDAGES/DRESSINGS) ×2 IMPLANT
BRNG TIB B UNCMP STRL LM/RL (Joint) ×1 IMPLANT
BRNG TIB MED 5 PHS 3 LT MEN (Orthopedic Implant) ×1 IMPLANT
CEMENT BONE R 1X40 (Cement) ×2 IMPLANT
CEMENT VACUUM MIXING SYSTEM (MISCELLANEOUS) ×2 IMPLANT
CHLORAPREP W/TINT 26 (MISCELLANEOUS) ×4 IMPLANT
COOLER POLAR GLACIER W/PUMP (MISCELLANEOUS) ×2 IMPLANT
COVER MAYO STAND REUSABLE (DRAPES) ×2 IMPLANT
CUFF TOURN SGL QUICK 24 (TOURNIQUET CUFF)
CUFF TOURN SGL QUICK 34 (TOURNIQUET CUFF)
CUFF TRNQT CYL 24X4X16.5-23 (TOURNIQUET CUFF) IMPLANT
CUFF TRNQT CYL 34X4.125X (TOURNIQUET CUFF) IMPLANT
DRAPE C-ARM XRAY 36X54 (DRAPES) IMPLANT
DRAPE U-SHAPE 47X51 STRL (DRAPES) ×4 IMPLANT
DRILL QUICK RELEASE 1/8 INCH (DRILL) ×1
DRSG MEPILEX SACRM 8.7X9.8 (GAUZE/BANDAGES/DRESSINGS) ×2 IMPLANT
DRSG OPSITE POSTOP 4X10 (GAUZE/BANDAGES/DRESSINGS) ×1 IMPLANT
DRSG OPSITE POSTOP 4X12 (GAUZE/BANDAGES/DRESSINGS) ×2 IMPLANT
DRSG OPSITE POSTOP 4X6 (GAUZE/BANDAGES/DRESSINGS) ×2 IMPLANT
ELECT CAUTERY BLADE 6.4 (BLADE) ×2 IMPLANT
ELECT REM PT RETURN 9FT ADLT (ELECTROSURGICAL) ×2
ELECTRODE REM PT RTRN 9FT ADLT (ELECTROSURGICAL) ×1 IMPLANT
GAUZE SPONGE 4X4 12PLY STRL (GAUZE/BANDAGES/DRESSINGS) ×2 IMPLANT
GAUZE XEROFORM 1X8 LF (GAUZE/BANDAGES/DRESSINGS) ×2 IMPLANT
GLOVE BIO SURGEON STRL SZ7.5 (GLOVE) ×8 IMPLANT
GLOVE BIO SURGEON STRL SZ8 (GLOVE) ×8 IMPLANT
GLOVE BIOGEL PI IND STRL 8 (GLOVE) ×1 IMPLANT
GLOVE BIOGEL PI INDICATOR 8 (GLOVE) ×1
GLOVE SURG UNDER LTX SZ8 (GLOVE) ×2 IMPLANT
GOWN STRL REUS W/ TWL LRG LVL3 (GOWN DISPOSABLE) ×1 IMPLANT
GOWN STRL REUS W/ TWL XL LVL3 (GOWN DISPOSABLE) ×1 IMPLANT
GOWN STRL REUS W/TWL LRG LVL3 (GOWN DISPOSABLE) ×2
GOWN STRL REUS W/TWL XL LVL3 (GOWN DISPOSABLE) ×2
HOOD PEEL AWAY FLYTE STAYCOOL (MISCELLANEOUS) ×6 IMPLANT
IMMBOLIZER KNEE 19 BLUE UNIV (SOFTGOODS) ×1 IMPLANT
INSERT TIBIAL OXFORD SZ B LF (Joint) ×1 IMPLANT
IV NS IRRIG 3000ML ARTHROMATIC (IV SOLUTION) ×2 IMPLANT
KIT TURNOVER KIT A (KITS) ×2 IMPLANT
MANIFOLD NEPTUNE II (INSTRUMENTS) ×2 IMPLANT
MAT ABSORB  FLUID 56X50 GRAY (MISCELLANEOUS) ×1
MAT ABSORB FLUID 56X50 GRAY (MISCELLANEOUS) ×1 IMPLANT
NDL SAFETY ECLIPSE 18X1.5 (NEEDLE) ×1 IMPLANT
NDL SPNL 20GX3.5 QUINCKE YW (NEEDLE) ×1 IMPLANT
NEEDLE HYPO 18GX1.5 SHARP (NEEDLE) ×2
NEEDLE SPNL 20GX3.5 QUINCKE YW (NEEDLE) ×2 IMPLANT
NS IRRIG 1000ML POUR BTL (IV SOLUTION) ×2 IMPLANT
PACK BLADE SAW RECIP 70 3 PT (BLADE) ×1 IMPLANT
PACK TOTAL KNEE (MISCELLANEOUS) ×2 IMPLANT
PAD ABD DERMACEA PRESS 5X9 (GAUZE/BANDAGES/DRESSINGS) ×3 IMPLANT
PAD WRAPON POLAR KNEE (MISCELLANEOUS) ×1 IMPLANT
PEG TWIN FEM CEMENTED MED (Knees) ×1 IMPLANT
PULSAVAC PLUS IRRIG FAN TIP (DISPOSABLE) ×2
STAPLER SKIN PROX 35W (STAPLE) ×2 IMPLANT
STRAP SAFETY 5IN WIDE (MISCELLANEOUS) ×2 IMPLANT
SUCTION FRAZIER HANDLE 10FR (MISCELLANEOUS) ×1
SUCTION TUBE FRAZIER 10FR DISP (MISCELLANEOUS) ×1 IMPLANT
SUT VIC AB 0 CT1 36 (SUTURE) ×2 IMPLANT
SUT VIC AB 2-0 CT1 27 (SUTURE) ×8
SUT VIC AB 2-0 CT1 TAPERPNT 27 (SUTURE) ×4 IMPLANT
SYR 10ML LL (SYRINGE) ×2 IMPLANT
SYR 30ML LL (SYRINGE) ×4 IMPLANT
TAPE TRANSPORE STRL 2 31045 (GAUZE/BANDAGES/DRESSINGS) ×2 IMPLANT
TIP FAN IRRIG PULSAVAC PLUS (DISPOSABLE) ×1 IMPLANT
WATER STERILE IRR 500ML POUR (IV SOLUTION) ×2 IMPLANT
WRAPON POLAR PAD KNEE (MISCELLANEOUS) ×2

## 2022-01-05 NOTE — OR Nursing (Signed)
Dr. Roland Rack applied knee immobilizer and will patient will be seen on Thurs. At 3:00 to evaluate dressing.

## 2022-01-05 NOTE — Progress Notes (Signed)
PT Cancellation Note  Patient Details Name: Edgar Huynh MRN: [id number] DOB: 07-17-1956   Cancelled Treatment:    Reason Eval/Treat Not Completed: Other (comment). Consult received and chart reviewed. Per RN, pt currently with large amount of bleeding, wound redressed, and may possibly need to return to OR. Will hold therapy at this time until POC established. RN in communication with PT.   Sarahanne Novakowski 01/05/2022, 2:47 PM Greggory Stallion, PT, DPT, GCS [phone]

## 2022-01-05 NOTE — H&P (Signed)
History of Present Illness: Edgar Huynh is a 66 y.o. who presents today for a history and physical. He is to undergo a partial left total knee arthroplasty on 01/05/2022. Last seen in the orthopedic department of [facility] on 11/27/2021. There is been no change in his condition since that time. Continues to have discomfort to the left knee to the point that he is requesting to proceed with surgery.  The symptoms began about 4 months ago and developed without any specific cause or injury. He saw his primary care provider who referred him to orthopedics. He saw Dr. Orion Modest who performed an aspiration/injection of the knee which he states provided moderate temporary relief of his symptoms, but only lasted a few weeks. Therefore, the patient was sent for an MRI scan and referred to me for further evaluation and treatment. He reports 2/10 pain. The pain is located along the medial aspect of the knee. The pain is described as aching, dull, stabbing and throbbing. The symptoms are aggravated with normal daily activities, using stairs, at higher levels of activity, walking, standing and standing pivot. He also describes no mechanical symptoms. He has associated swelling and no deformity. He has tried acetaminophen, over-the-counter medications, steroid injections, ice and heat with limited benefit.  Patient was advised by the hospital and his primary care to discontinue his Plavix on May 18. Which is 5 days preop versus a 7-day that is recommended.  Past Medical History:  Anxiety disorder   Chronic diastolic CHF (congestive heart failure) (CMS-HCC) 04/14/2021   Depression   Diabetes mellitus type 2, uncomplicated (CMS-HCC)   Esophagitis, eosinophilic 3/71/6967   GERD (gastroesophageal reflux disease)   Glaucoma (increased eye pressure)   History of colon polyps 10/21/2017   Hyperlipidemia   Hypertension   Neuropathy   Primary osteoarthritis of left knee 11/27/2021   Stroke (CMS-HCC)   Past Surgical  History:  COLONOSCOPY 08/25/2018 ([place] Adenomatous Polyps: [facility] 08/2023)   EGD 89/38/1017 (Eosinophilic Esophagitis: [facility] 08/2021)   CATARACT EXTRACTION Bilateral   COLONOSCOPY   OTHER SURGERY (ARTERY)   UPPER GASTROINTESTINAL ENDOSCOPY   Past Family History:  No Known Problems Mother   Parkinsonism Father   Diabetes type II Brother   Medications:  alpha lipoic acid 200 mg Cap Take 200 mg by mouth 2 (two) times daily   amLODIPine (NORVASC) 5 MG tablet TAKE 1 TABLET(5 MG) BY MOUTH EVERY DAY 30 tablet 5   atorvastatin (LIPITOR) 20 MG tablet TAKE 1 TABLET(20 MG) BY MOUTH EVERY DAY 90 tablet 2   blood glucose diagnostic (CONTOUR NEXT TEST STRIPS) test strip TEST BLOOD SUGAR 7 TIMES A DAY AS DIRECTED (Patient taking differently: TEST BLOOD SUGAR 7 TIMES A DAY AS DIRECTED Has a dexicom) 700 strip 3   budesonide-formoteroL (SYMBICORT) 80-4.5 mcg/actuation inhaler Inhale 2 inhalations into the lungs 2 (two) times daily 10.2 g 12   clonazePAM (KLONOPIN) 0.5 MG tablet Take 1 tablet (0.5 mg total) by mouth 2 (two) times daily as needed 60 tablet 0   clopidogreL (PLAVIX) 75 mg tablet TAKE 1 TABLET(75 MG) BY MOUTH EVERY DAY 90 tablet 1   coenzyme Q10 10 mg capsule Take 100 mg by mouth once daily   diclofenac (VOLTAREN) 1 % topical gel APPLY 2 GRAMS EXTERNALLY TO THE AFFECTED AREA FOUR TIMES DAILY 100 g 11   escitalopram oxalate (LEXAPRO) 20 MG tablet Take 1 tablet (20 mg total) by mouth once daily 90 tablet 3   FUROsemide (LASIX) 20 MG tablet Take 1 tablet (20  mg total) by mouth once daily as needed for Edema 30 tablet 11   hydrocortisone 2.5 % cream Apply topically   insulin LISPRO (ADMELOG, HUMALOG) injection (concentration 100 units/mL) INJECT UP TO 70 UNITS VIA PUMP DAILY AS DIRECTED 70 mL 4   ketoconazole (NIZORAL) 2 % cream Apply topically   latanoprost (XALATAN) 0.005 % ophthalmic solution Apply 1 drop to eye at bedtime   multivitamin tablet Take 1 tablet by mouth once daily   omeprazole  (PRILOSEC) 40 MG DR capsule Take 1 capsule (40 mg total) by mouth 2 (two) times daily before meals 180 capsule 3   [START ON 01/01/2022] oxyCODONE (ROXICODONE) 5 MG immediate release tablet Take 1-2 tablets (5-10 mg total) by mouth every 4 (four) hours as needed for Pain 40 tablet 0   potassium gluconate 2.5 mEq Tab Take 2 tablets by mouth once daily   risperiDONE (RISPERDAL) 0.5 MG tablet Take 1 tablet (0.5 mg total) by mouth at bedtime for 180 days 30 tablet 5   Allergies:   Clonidine 0.3 mg patch (Patches in general cause rashes per patient)   Review of Systems: A comprehensive 14 point ROS was performed, reviewed, and the pertinent orthopaedic findings are documented in the HPI.  Physical Exam: BP 122/70 (BP Location: Left upper arm, Patient Position: Sitting, BP Cuff Size: Large Adult)  Ht 172.7 cm ('5\' 8"'$ )  Wt 87.1 kg (192 lb)  BMI 29.19 kg/m   General: Well-developed well-nourished male seen in no acute distress.   HEENT: Atraumatic,normocephalic. Pupils are equal and reactive to light. Oropharynx is clear with moist mucosa  Lungs: Clear to auscultation bilaterally   Cardiovascular: Regular rate and rhythm. Normal S1, S2. No murmurs. No appreciable gallops or rubs. Peripheral pulses are palpable.  Abdomen: Soft, non-tender, nondistended. Bowel sounds present  Left knee exam: GAIT: Minimal limp and uses no assistive devices. ALIGNMENT: normal SKIN: unremarkable SWELLING: mild EFFUSION: 1+ WARMTH: no warmth TENDERNESS: Mild-moderate along the medial joint line, but no lateral joint line tenderness and no peripatellar tenderness ROM: 0 to 135 degrees with mild discomfort at maximal flexion McMURRAY'S: positive PATELLOFEMORAL: normal tracking with no peri-patellar tenderness and negative apprehension sign CREPITUS: no LACHMAN'S: negative PIVOT SHIFT: negative ANTERIOR DRAWER: negative POSTERIOR DRAWER: negative VARUS/VALGUS: stable   He is neurovascular intact to  the left lower extremity and foot.  Neurological: The patient is alert and oriented Sensation to light touch appears to be intact and within normal limits Gross motor strength appeared to be equal to 5/5  Vascular : Peripheral pulses felt to be palpable. Capillary refill appears to be intact and within normal limits  X-ray: X-rays of the left knee taken in [facility] showed mild degenerative changes primarily involving the medial compartment with tenderness in the medial joint space narrowing. Overall alignment was neutral. No fractures, lytic lesions or abnormal calcifications were noted.  Knee Imaging, external: Left knee: A recent MRI scan of the left knee is available for review and has been reviewed by myself. By report, the study demonstrates evidence of significant tearing and maceration of the middle and posterior portions of the medial meniscus, as well as moderate degenerative changes involving the medial compartment with areas of partial and full-thickness articular cartilage loss in the weightbearing surfaces of the femur femur and tibia. In addition, there are areas of subchondral bone marrow edema on both the tibia and femur. The lateral compartment demonstrates evidence of mild focal degenerative changes as well as mild fraying of the lateral meniscus.  The patellofemoral compartment is well-maintained, as are the anterior and posterior cruciate ligaments. This report was reviewed by myself and discussed with the patient.  Impression: 1. Degenerative arthrosis left knee 2. Complex tear medial meniscus of left knee  Plan:  The treatment options were discussed with the patient. In addition, patient educational materials were provided regarding the diagnosis and treatment options. The patient is quite frustrated with his symptoms and functional limitations, and is ready to consider more aggressive treatment options. Given the amount of degenerative changes and subchondral edema  observed on the MRI scan, I do not feel that he he would benefit from an arthroscopic procedure to manage the meniscus. Therefore, I have recommended a surgical procedure, specifically a left partial knee replacement. The procedure was discussed with the patient, as were the potential risks (including bleeding, infection, nerve and/or blood vessel injury, persistent or recurrent pain, loosening and/or failure of the components, dislocation, need for further surgery, blood clots, strokes, heart attacks and/or arhythmias, pneumonia, etc.) and benefits. The patient states his/her understanding and wishes to proceed. All of the patient's questions and concerns were answered. He can call any time with further concerns. He will follow up post-surgery, routine.   H&P reviewed and patient re-examined. No changes.

## 2022-01-05 NOTE — OR Nursing (Signed)
Voided 600cc

## 2022-01-05 NOTE — Op Note (Signed)
01/05/2022  9:35 AM  Patient:   Edgar Huynh  Pre-Op Diagnosis:   Osteoarthritis of medial compartment, left knee.  Post-Op Diagnosis:   Same  Procedure:   Left unicondylar knee arthroplasty.  Surgeon:   Pascal Lux, MD  Assistant:   Cameron Proud, PA-C  Anesthesia:   Spinal  Findings:   As above.  Complications:   None  EBL:   10 cc  Fluids:   700 cc crystalloid  UOP:   None  TT:   80 minutes at 300 mmHg  Drains:   None  Closure:   Staples  Implants:   All-cemented Biomet Oxford system with a medium femoral component, a "B" sized tibial tray, and a 5 mm meniscal bearing insert.  Brief Clinical Note:   The patient is a 66 year old male with a history of worsening medial sided left knee pain. His symptoms have progressed despite medications, activity modification, etc. His history and examination are consistent with a medial meniscus tear and degenerative joint disease, primarily limited to the medial compartment, all of which were confirmed by preoperative MRI scanning. The patient presents at this time for a left partial knee replacement.  Procedure:   The patient was brought into the operating room.  A spinal was placed by the anesthesiologist before the patient was lain in the supine position. The patient was repositioned so that the non-surgical leg was placed in a flexed and abducted position in the yellow fin leg holder while the surgical extremity was placed over the Biomet leg holder. The left lower extremity was prepped with ChloraPrep solution before being draped sterilely. Preoperative antibiotics were administered. After performing a timeout to verify the appropriate surgical site, the limb was exsanguinated with an Esmarch and the tourniquet inflated to 300 mmHg.   A standard anterior approach to the knee was made through an approximately 3.5-4 inch incision. The incision was carried down through the subcutaneous tissues to expose the superficial retinaculum.  This was split the length the incision and the medial flap elevated sufficiently to expose the medial retinaculum. The medial retinaculum was incised along the medial border of the patella tendon and extended proximally along the medial border of the patella, leaving a 3-4 mm cuff of tissue. The soft tissues were elevated off the anteromedial aspect of the proximal tibia. The anterior portion of the meniscus was removed after performing a subtotal excision of the infrapatellar fat pad. The anterior cruciate ligament was inspected and found to be in excellent condition. Osteophytes were removed from the inferior pole of the patella as well as from the notch using a quarter-inch osteotome. There were significant degenerative changes of both the femur and tibia on the medial side.   The medial femoral condyle was sized using the large and medium sizers. It was felt that the medium guide best optimized the contour of the femur. This was left in place and the external tibial guide positioned. The 4 mm coupling device was used to connect the guide to the medial femoral condylar sizer to optimize appropriate orientation. Two guide pins were inserted into the cutting block before the coupling device and sizer were removed. The appropriate tibial cut was made using the oscillating and reciprocating saws. The piece was removed in its entirety and taken to the back table where it was sized and found to be optimally replicated by a "B" sized component. The 9 mm spacer was inserted to verify that sufficient bone had been removed.  Attention was directed  to femoral side. The intramedullary canal was accessed through a 4 mm drill hole. The intramedullary guide was positioned before the guide for the femoral condylar holes was positioned. The appropriate coupling device connected this guide to the intramedullary guide before both drill holes were created in the distal aspect of the medial femoral condyle. The devices were  removed and the posterior condylar cutting block inserted. The appropriate cut was made using the reciprocating saw and this piece removed. The #0 spigot was inserted and the initial bone milling performed. A trial femoral component was inserted and both the flexion and extension gaps measured. In flexion, the gap measured 9 mm whereas in extension, it measured 7 mm. Therefore, the #2 spigot was selected and the secondary bone milling performed. Repeat sizing demonstrated symmetric flexion and extension gaps. The bone was removed from the postero-medial and postero-lateral aspects of the femoral condyle, as well as from the beneath the collar of the spigot. Bone also was removed from the anterior portion of the femur so as to minimize any potential impingement with the meniscal bearing insert. The trial components removed and several drill holes placed into the distal femoral condyle to further augment cement fixation.  Attention was redirected to the tibial side. The "B" sized tibial tray was positioned and temporarily secured using the appropriate spiked nail. The keel was created using the bi-bladed reciprocating saw and hoe. The keeled "B" sized trial tibial tray was inserted to be sure that it seated properly. At this point, a total of 20 cc of Exparel diluted out to 60 cc with normal saline and 30 cc of 0.5% Sensorcaine was injected in and around the posterior and medial capsular tissues, as well as the peri-incisional tissues to help with postoperative pain control.  The bony surfaces were prepared for cementing by irrigating them thoroughly with bacitracin saline solution using the jet lavage system before packing them with a dry Ray-Tec sponge. Meanwhile, cement was being mixed on the back table. When the cement was ready, the tibial tray was cemented in first. The excess cement was removed using a Surveyor, quantity after impacting it into place. Next, the femoral component was impacted into place. Again  the excess cement was removed using a Surveyor, quantity. The 5 mm spacer was inserted and the knee brought into near full extension while the cement hardened. Once the cement hardened, the spacer was removed and the 5 mm meniscal bearing insert was trialed. This demonstrated excellent tracking while the knee was placed through a range of motion, and showed no evidence towards subluxation or dislocation. In addition, it did not fit too tightly. Therefore, the permanent 5 mm meniscal bearing insert was snapped into position after verifying that no cement had been retained posteriorly. Again the knee was placed through a range of motion with the findings as described above.  The wound was copiously irrigated with sterile saline solution via the jet lavage system before the retinacular layer was reapproximated using #0 Vicryl interrupted sutures. At this point, 1 g of transexemic acid in 10 cc of normal saline was injected intra-articularly. The subcutaneous tissues were closed in two layers using 2-0 Vicryl interrupted sutures before the skin was closed using staples. A sterile occlusive dressing was applied to the knee before the patient was awakened. The patient was transferred back to his/her hospital bed and returned to the recovery room in satisfactory condition after tolerating the procedure well. A Polar Care device was applied to the knee as well.

## 2022-01-05 NOTE — OR Nursing (Signed)
Bladder scan 524cc.  Will attempt to urinate.  Starting to feel need. Glucose 194 on machine and gave bolus of insulin on pump

## 2022-01-05 NOTE — OR Nursing (Signed)
Dr. Roland Rack here to assess bleeding from surgical site.  Redressed.  Hold PT.  Place NPO.  Will re-evaluate

## 2022-01-05 NOTE — OR Nursing (Signed)
CBG 184  

## 2022-01-05 NOTE — Evaluation (Signed)
Physical Therapy Evaluation Patient Details Name: Edgar Huynh MRN: [id number] DOB: 1955/10/01 Today's Date: 01/05/2022  History of Present Illness  Pt admitted for L uni knee. HIstory includes anxiety, depression, DM, GERD, GLaucoma, HLD, and HTN. Post op complications noted including bleeding, cleared to participate via MD.  Clinical Impression  Pt is a pleasant 66 year old male who was admitted for L uni knee, POD 0 at time of evaluation. Pt performs bed mobility, transfers, and ambulation with cga and RW. Educated on precautions prior to mobility. Pt demonstrates deficits with strength/pain/mobility. KI worn throughout session. At end of session, removed to assess wound. Noted blood seeping in ace wrap, notified RN and MD. Both to bedside to apply wound vac. Would benefit from skilled PT to address above deficits and promote optimal return to PLOF. Recommend transition to [facility] upon discharge from acute hospitalization.      Recommendations for follow up therapy are one component of a multi-disciplinary discharge planning process, led by the attending physician.  Recommendations may be updated based on patient status, additional functional criteria and insurance authorization.  Follow Up Recommendations Home health PT    Assistance Recommended at Discharge Set up Supervision/Assistance  Patient can return home with the following       Equipment Recommendations Rolling walker (2 wheels);BSC/3in1  Recommendations for Other Services       Functional Status Assessment Patient has had a recent decline in their functional status and demonstrates the ability to make significant improvements in function in a reasonable and predictable amount of time.     Precautions / Restrictions Precautions Precautions: Fall;Knee Precaution Booklet Issued: Yes (comment) Restrictions Weight Bearing Restrictions: Yes LLE Weight Bearing: Weight bearing as tolerated      Mobility  Bed  Mobility Overal bed mobility: Needs Assistance Bed Mobility: Supine to Sit     Supine to sit: Min guard     General bed mobility comments: safe technique with HHA. Once seated at EOB, upright posture noted    Transfers Overall transfer level: Needs assistance Equipment used: Rolling walker (2 wheels) Transfers: Sit to/from Stand Sit to Stand: Min guard           General transfer comment: safe technique with good weight acceptance. RW used    Ambulation/Gait Ambulation/Gait assistance: Counsellor (Feet): 200 Feet Assistive device: Rolling walker (2 wheels) Gait Pattern/deviations: Step-through pattern       General Gait Details: ambulated around RN station with reciprocal gait pattern. Upright posture noted. KI used  Stairs Stairs: Yes Stairs assistance: Min guard Stair Management: One rail Left Number of Stairs: 4 General stair comments: safe technique with step to gait pattern. KI donned for all mobility with no knee flexion during stair traiing  Wheelchair Mobility    Modified Rankin (Stroke Patients Only)       Balance Overall balance assessment: Modified Independent                                           Pertinent Vitals/Pain Pain Assessment Pain Assessment: 0-10 Pain Score: 8  Pain Location: L knee Pain Descriptors / Indicators: Operative site guarding Pain Intervention(s): Limited activity within patient's tolerance, RN gave pain meds during session, Ice applied    Home Living Family/patient expects to be discharged to:: Private residence Living Arrangements: Spouse/significant other;Parent Available Help at Discharge: Family Type of Home: [facility]  Access: Stairs to enter Entrance Stairs-Rails: Left Entrance Stairs-Number of Steps: 4 Alternate Level Stairs-Number of Steps: flight Home Layout: Two level;Able to live on main level with bedroom/bathroom   Additional Comments: caregivers with physical  mobility restrictions.    Prior Function Prior Level of Function : Independent/Modified Independent             Mobility Comments: indep       Hand Dominance        Extremity/Trunk Assessment   Upper Extremity Assessment Upper Extremity Assessment: Overall WFL for tasks assessed    Lower Extremity Assessment Lower Extremity Assessment: Generalized weakness (L knee 3/5; tremors noted in B feet-chronic)       Communication   Communication: No difficulties  Cognition Arousal/Alertness: Awake/alert Behavior During Therapy: WFL for tasks assessed/performed Overall Cognitive Status: Within Functional Limits for tasks assessed                                          General Comments      Exercises Other Exercises Other Exercises: supine ther-ex performed on L LE includnig SLRs and hip abd/add. 10 reps. Reviewed written HEP, however per discussion with MD, no knee flexion at this time until bleeding subsides. KI donned throughout   Assessment/Plan    PT Assessment Patient needs continued PT services  PT Problem List Decreased strength;Decreased balance;Decreased mobility;Pain       PT Treatment Interventions DME instruction;Gait training;Stair training;Therapeutic exercise    PT Goals (Current goals can be found in the Care Plan section)  Acute Rehab PT Goals Patient Stated Goal: to go home PT Goal Formulation: With patient Time For Goal Achievement: 01/19/22 Potential to Achieve Goals: Good    Frequency BID     Co-evaluation               AM-PAC PT "6 Clicks" Mobility  Outcome Measure Help needed turning from your back to your side while in a flat bed without using bedrails?: A Little Help needed moving from lying on your back to sitting on the side of a flat bed without using bedrails?: A Little Help needed moving to and from a bed to a chair (including a wheelchair)?: A Little Help needed standing up from a chair using your arms  (e.g., wheelchair or bedside chair)?: A Little Help needed to walk in hospital room?: A Little Help needed climbing 3-5 steps with a railing? : A Little 6 Click Score: 18    End of Session Equipment Utilized During Treatment: Gait belt Activity Tolerance: Patient tolerated treatment well Patient left: in chair Nurse Communication: Mobility status PT Visit Diagnosis: Muscle weakness (generalized) (M62.81);Difficulty in walking, not elsewhere classified (R26.2);Pain Pain - Right/Left: Left Pain - part of body: Knee    Time: 1027-2536 PT Time Calculation (min) (ACUTE ONLY): 24 min   Charges:   PT Evaluation $PT Eval Low Complexity: 1 Low PT Treatments $Gait Training: 8-22 mins        Greggory Stallion, PT, DPT, GCS [phone]   Takoya Jonas 01/05/2022, 4:57 PM

## 2022-01-05 NOTE — Anesthesia Preprocedure Evaluation (Addendum)
Anesthesia Evaluation  Patient identified by MRN, date of birth, ID band Patient awake    Reviewed: Allergy & Precautions, NPO status , Patient's Chart, lab work & pertinent test results  History of Anesthesia Complications Negative for: history of anesthetic complications  Airway Mallampati: IV   Neck ROM: Full    Dental no notable dental hx.    Pulmonary sleep apnea , COPD,    Pulmonary exam normal breath sounds clear to auscultation       Cardiovascular hypertension, Normal cardiovascular exam Rhythm:Regular Rate:Normal  ECG 12/25/21:  Sinus tachycardia ST & T wave abnormality, consider inferior ischemia  Echo 04/08/21:  ABNORMAL STRESS ECHOCARDIOGRAM  NORMAL RIGHT VENTRICULAR SYSTOLIC FUNCTION  MILD VALVULAR REGURGITATION   NO VALVULAR STENOSIS NOTED    Neuro/Psych PSYCHIATRIC DISORDERS Anxiety Depression TIA Neuromuscular disease (neuropathy) CVA (1998, no residual deficits; last dose of Plavix 12/28/21)    GI/Hepatic GERD  Medicated,  Endo/Other  diabetes, Type 1, Insulin DependentObesity   Renal/GU Renal disease (nephrolithiasis)     Musculoskeletal  (+) Arthritis ,   Abdominal   Peds  Hematology negative hematology ROS (+)   Anesthesia Other Findings   Reproductive/Obstetrics                            Anesthesia Physical Anesthesia Plan  ASA: 3  Anesthesia Plan: General and Spinal   Post-op Pain Management:    Induction: Intravenous  PONV Risk Score and Plan: 2 and Propofol infusion, TIVA, Treatment may vary due to age or medical condition and Ondansetron  Airway Management Planned: Natural Airway and Nasal Cannula  Additional Equipment:   Intra-op Plan:   Post-operative Plan:   Informed Consent: I have reviewed the patients History and Physical, chart, labs and discussed the procedure including the risks, benefits and alternatives for the proposed anesthesia with  the patient or authorized representative who has indicated his/her understanding and acceptance.       Plan Discussed with: CRNA  Anesthesia Plan Comments: (Plan for spinal and GA with natural airway, LMA/GETA backup.  Patient consented for risks of anesthesia including but not limited to:  - adverse reactions to medications - damage to eyes, teeth, lips or other oral mucosa - nerve damage due to positioning  - sore throat or hoarseness - headache, bleeding, infection, nerve damage 2/2 spinal - damage to heart, brain, nerves, lungs, other parts of body or loss of life  Informed patient about role of CRNA in peri- and intra-operative care.  Patient voiced understanding.)        Anesthesia Quick Evaluation

## 2022-01-05 NOTE — Discharge Instructions (Addendum)
Orthopedic discharge instructions: May shower with intact OpSite dressing. Apply ice frequently to knee or use Polar Care device. Start Eliquis 2.5 mg twice daily for 2 weeks as of Wednesday, 01/06/2022, then may resume Plavix. Take pain medication as prescribed or ES Tylenol when needed.  May weight-bear as tolerated on left leg- use walker for balance and support. Follow-up in 10-14 days or as scheduled.  AMBULATORY SURGERY  DISCHARGE INSTRUCTIONS   The drugs that you were given will stay in your system until tomorrow so for the next 24 hours you should not:  Drive an automobile Make any legal decisions Drink any alcoholic beverage   You may resume regular meals tomorrow.  Today it is better to start with liquids and gradually work up to solid foods.  You may eat anything you prefer, but it is better to start with liquids, then soup and crackers, and gradually work up to solid foods.   Please notify your doctor immediately if you have any unusual bleeding, trouble breathing, redness and pain at the surgery site, drainage, fever, or pain not relieved by medication.    Additional Instructions:  May have ibuprofen at  !0:00.  Alternate with '1000mg'$  of tylenol every 6 hours.  One or the other every 3 hours.  Please contact your physician with any problems or Same Day Surgery at [phone], Monday through Friday 6 am to 4 pm, or [place] at [facility] number at [phone].

## 2022-01-05 NOTE — TOC Initial Note (Signed)
Transition of Care [facility]) - Initial/Assessment Note    Patient Details  Name: RENOLD KOZAR MRN: [id number] Date of Birth: 11-01-1955  [facility] CM/SW Contact:    Conception Oms, RN Phone Number: 01/05/2022, 10:12 AM  Clinical Narrative:   Patient is set up with [place] for [facility] services                3 in 1 and RW will be delivered to the bedside by [employer]  Expected Discharge Plan: [place] Barriers to Discharge: Barriers Resolved   Patient Goals and CMS Choice        Expected Discharge Plan and Services Expected Discharge Plan: [place]   Discharge Planning Services: CM Consult                     DME Arranged: [place] Rile rolling, 3-N-1 DME Agency: [employer] Date DME Agency Contacted: 01/05/22 Time DME Agency Contacted: 68 Representative spoke with at DME Agency: Suanne [place] Arranged: PT [place] Agency: [place] Date [place]: 01/05/22 Time [place]: 1003 Representative spoke with at [place]: [place]  Prior Living Arrangements/Services       Do you feel safe going back to the place where you live?: Yes          Current home services: DME (shower seat)    Activities of Daily Living      Permission Sought/Granted                  Emotional Assessment              Admission diagnosis:  Primary osteoarthritis of left knee M17.12 Complex tear of medial meniscus of left knee as current injury, subsequent encounter S83.232D Patient Active Problem List   Diagnosis Date Noted   SOB (shortness of breath) 04/09/2021   HLD (hyperlipidemia) 04/09/2021   HTN (hypertension) 04/09/2021   Stroke ([place]) 04/09/2021   Chest pain 04/09/2021   Depression with anxiety 04/09/2021   Anxiety disorder 09/23/2017   MDD (major depressive disorder), single episode, severe with psychotic features ([place]) 03/01/2017   Adjustment disorder with mixed anxiety and depressed mood  02/28/2017   Psychosis, 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 [place] [address] Phone: [phone] Fax: [phone]     Social Determinants of Health (SDOH) Interventions    Readmission Risk Interventions     View : No data to display.

## 2022-01-05 NOTE — Anesthesia Procedure Notes (Signed)
Procedure Name: General with mask airway Date/Time: 01/05/2022 8:01 AM Performed by: Kelton Pillar, CRNA Pre-anesthesia Checklist: Patient identified, Emergency Drugs available, Suction available and Patient being monitored Patient Re-evaluated:Patient Re-evaluated prior to induction Oxygen Delivery Method: Simple face mask Induction Type: IV induction Placement Confirmation: positive ETCO2 and CO2 detector Dental Injury: Teeth and Oropharynx as per pre-operative assessment

## 2022-01-05 NOTE — Progress Notes (Signed)
Anesthesia notified blood sugar 195. No new orders at this time

## 2022-01-05 NOTE — OR Nursing (Signed)
PT here and walked patient and completed instructions.  Bleeding noted on dressing.  Dr. Roland Rack notified and he applied negative pressure Preveen pump.  Instructed patient on how to change cartridge if necessary.

## 2022-01-05 NOTE — Anesthesia Procedure Notes (Signed)
Spinal  Patient location during procedure: OR Start time: 01/05/2022 7:30 AM End time: 01/05/2022 7:33 AM Reason for block: surgical anesthesia Staffing Performed: anesthesiologist  Anesthesiologist: Darrin Nipper, MD Preanesthetic Checklist Completed: patient identified, IV checked, site marked, risks and benefits discussed, surgical consent, monitors and equipment checked, pre-op evaluation and timeout performed Spinal Block Patient position: sitting Prep: ChloraPrep Patient monitoring: cardiac monitor, continuous pulse ox and blood pressure Approach: midline Location: L3-4 Injection technique: single-shot Needle Needle type: Sprotte  Needle gauge: 24 G Needle length: 9 cm Assessment Sensory level: T4 Events: CSF return

## 2022-01-05 NOTE — Transfer of Care (Addendum)
Immediate Anesthesia Transfer of Care Note  Patient: Edgar Huynh  Procedure(s) Performed: UNICOMPARTMENTAL KNEE (Left: Knee)  Patient Location: PACU  Anesthesia Type:General  Level of Consciousness: awake, drowsy and patient cooperative  Airway & Oxygen Therapy: Patient Spontanous Breathing and Patient connected to face mask oxygen  Post-op Assessment: Report given to RN and Post -op Vital signs reviewed and stable  Post vital signs: Reviewed and stable  Last Vitals:  Vitals Value Taken Time  BP 140/77 01/05/22 0945  Temp    Pulse 88 01/05/22 0947  Resp 10 01/05/22 0947  SpO2 98 % 01/05/22 0947  Vitals shown include unvalidated device data.  Last Pain:  Vitals:   01/05/22 0634  TempSrc: Temporal  PainSc: 0-No pain         Complications: No notable events documented.

## 2022-01-05 NOTE — Progress Notes (Cosign Needed)
Patient is not able to walk the distance required to go the bathroom, or he/she is unable to safely negotiate stairs required to access the bathroom.  A 3in1 BSC will alleviate this problem  

## 2022-01-05 NOTE — OR Nursing (Signed)
Bleeding noted when he got up to urinate on side of bed. Dressing reinforced with 4x4 and ace wrap with pressure.  Dr. Roland Rack in [place] notified to visit patient to assess.

## 2022-01-05 NOTE — Anesthesia Postprocedure Evaluation (Signed)
Anesthesia Post Note  Patient: Edgar Huynh  Procedure(s) Performed: UNICOMPARTMENTAL KNEE (Left: Knee)  Patient location during evaluation: PACU Anesthesia Type: Spinal Level of consciousness: awake and alert, oriented and patient cooperative Pain management: pain level controlled Vital Signs Assessment: post-procedure vital signs reviewed and stable Respiratory status: spontaneous breathing, nonlabored ventilation and respiratory function stable Cardiovascular status: blood pressure returned to baseline and stable Postop Assessment: adequate PO intake, no headache and spinal receding Anesthetic complications: no   No notable events documented.   Last Vitals:  Vitals:   01/05/22 1030 01/05/22 1045  BP: 133/77 (!) 148/84  Pulse: 74 72  Resp: 14 12  Temp:    SpO2: 94% 95%    Last Pain:  Vitals:   01/05/22 1045  TempSrc:   PainSc: 0-No pain                 Darrin Nipper

## 2022-01-12 ENCOUNTER — Encounter: Payer: Self-pay | Admitting: Surgery

## 2022-01-18 NOTE — Progress Notes (Cosign Needed)
Patient is not able to walk the distance required to go the bathroom, or he/she is unable to safely negotiate stairs required to access the bathroom.  A 3in1 BSC will alleviate this problem  

## 2022-09-01 ENCOUNTER — Other Ambulatory Visit: Payer: Self-pay

## 2022-09-01 ENCOUNTER — Encounter: Payer: Self-pay | Admitting: Internal Medicine

## 2022-09-01 ENCOUNTER — Encounter
Admission: RE | Disposition: A | Discharge status: Home / Self Care | Payer: Self-pay | Source: Home / Self Care | Attending: Internal Medicine

## 2022-09-01 ENCOUNTER — Ambulatory Visit: Payer: Medicare Other | Admitting: Anesthesiology

## 2022-09-01 ENCOUNTER — Ambulatory Visit
Admission: RE | Admit: 2022-09-01 | Discharge: 2022-09-01 | Disposition: A | Discharge status: Home / Self Care | Payer: Medicare Other | Attending: Internal Medicine | Admitting: Internal Medicine

## 2022-09-01 DIAGNOSIS — Z86010 Personal history of colonic polyps: Secondary | ICD-10-CM | POA: Diagnosis not present

## 2022-09-01 DIAGNOSIS — F32A Depression, unspecified: Secondary | ICD-10-CM | POA: Diagnosis not present

## 2022-09-01 DIAGNOSIS — J449 Chronic obstructive pulmonary disease, unspecified: Secondary | ICD-10-CM | POA: Insufficient documentation

## 2022-09-01 DIAGNOSIS — Z79899 Other long term (current) drug therapy: Secondary | ICD-10-CM | POA: Diagnosis not present

## 2022-09-01 DIAGNOSIS — Z7901 Long term (current) use of anticoagulants: Secondary | ICD-10-CM | POA: Insufficient documentation

## 2022-09-01 DIAGNOSIS — Z7951 Long term (current) use of inhaled steroids: Secondary | ICD-10-CM | POA: Insufficient documentation

## 2022-09-01 DIAGNOSIS — E7800 Pure hypercholesterolemia, unspecified: Secondary | ICD-10-CM | POA: Insufficient documentation

## 2022-09-01 DIAGNOSIS — K2100 Gastro-esophageal reflux disease with esophagitis, without bleeding: Secondary | ICD-10-CM | POA: Diagnosis not present

## 2022-09-01 DIAGNOSIS — G4730 Sleep apnea, unspecified: Secondary | ICD-10-CM | POA: Insufficient documentation

## 2022-09-01 DIAGNOSIS — Z9641 Presence of insulin pump (external) (internal): Secondary | ICD-10-CM | POA: Insufficient documentation

## 2022-09-01 DIAGNOSIS — Z794 Long term (current) use of insulin: Secondary | ICD-10-CM | POA: Insufficient documentation

## 2022-09-01 DIAGNOSIS — Z7902 Long term (current) use of antithrombotics/antiplatelets: Secondary | ICD-10-CM | POA: Diagnosis not present

## 2022-09-01 DIAGNOSIS — F419 Anxiety disorder, unspecified: Secondary | ICD-10-CM | POA: Insufficient documentation

## 2022-09-01 DIAGNOSIS — K2970 Gastritis, unspecified, without bleeding: Secondary | ICD-10-CM | POA: Insufficient documentation

## 2022-09-01 DIAGNOSIS — K200 Eosinophilic esophagitis: Secondary | ICD-10-CM | POA: Diagnosis not present

## 2022-09-01 DIAGNOSIS — K449 Diaphragmatic hernia without obstruction or gangrene: Secondary | ICD-10-CM | POA: Insufficient documentation

## 2022-09-01 DIAGNOSIS — I10 Essential (primary) hypertension: Secondary | ICD-10-CM | POA: Insufficient documentation

## 2022-09-01 HISTORY — PX: ESOPHAGOGASTRODUODENOSCOPY: SHX5428

## 2022-09-01 LAB — GLUCOSE, CAPILLARY
Glucose-Capillary: 125 mg/dL — ABNORMAL HIGH (ref 70–99)
Glucose-Capillary: 97 mg/dL (ref 70–99)

## 2022-09-01 SURGERY — EGD (ESOPHAGOGASTRODUODENOSCOPY)
Anesthesia: General

## 2022-09-01 MED ORDER — LIDOCAINE HCL (PF) 2 % IJ SOLN
INTRAMUSCULAR | Status: AC
  Filled 2022-09-01: qty 5

## 2022-09-01 MED ORDER — PROPOFOL 10 MG/ML IV BOLUS
INTRAVENOUS | Status: DC | PRN
  Administered 2022-09-01 (×2): 20 mg via INTRAVENOUS
  Administered 2022-09-01: 100 mg via INTRAVENOUS

## 2022-09-01 MED ORDER — PROPOFOL 500 MG/50ML IV EMUL
INTRAVENOUS | Status: DC | PRN
  Administered 2022-09-01: 125 ug/kg/min via INTRAVENOUS

## 2022-09-01 MED ORDER — LIDOCAINE HCL (CARDIAC) PF 100 MG/5ML IV SOSY
PREFILLED_SYRINGE | INTRAVENOUS | Status: DC | PRN
  Administered 2022-09-01: 100 mg via INTRAVENOUS

## 2022-09-01 MED ORDER — GLYCOPYRROLATE 0.2 MG/ML IJ SOLN
INTRAMUSCULAR | Status: DC | PRN
  Administered 2022-09-01: .2 mg via INTRAVENOUS

## 2022-09-01 MED ORDER — GLYCOPYRROLATE 0.2 MG/ML IJ SOLN
INTRAMUSCULAR | Status: AC
  Filled 2022-09-01: qty 1

## 2022-09-01 MED ORDER — SODIUM CHLORIDE 0.9 % IV SOLN: INTRAVENOUS | Status: DC

## 2022-09-01 NOTE — Transfer of Care (Signed)
Immediate Anesthesia Transfer of Care Note  Patient: Edgar Huynh  Procedure(s) Performed: ESOPHAGOGASTRODUODENOSCOPY (EGD)  Patient Location: PACU  Anesthesia Type:General  Level of Consciousness: drowsy  Airway & Oxygen Therapy: Patient Spontanous Breathing and Patient connected to nasal cannula oxygen  Post-op Assessment: Report given to RN and Post -op Vital signs reviewed and stable  Post vital signs: Reviewed and stable  Last Vitals:  Vitals Value Taken Time  BP 102/63   Temp 97   Pulse 88   Resp 19   SpO2 94     Last Pain:  Vitals:   09/01/22 0852  TempSrc: Temporal  PainSc: 0-No pain         Complications: No notable events documented.

## 2022-09-01 NOTE — Interval H&P Note (Signed)
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  The various methods of treatment have been discussed with the patient and family. After consideration of risks, benefits and other options for treatment, the patient has consented to  Procedure(s): ESOPHAGOGASTRODUODENOSCOPY (EGD) (N/A) as a surgical intervention.  The patient's history has been reviewed, patient examined, no change in status, stable for surgery.  I have reviewed the patient's chart and labs.  Questions were answered to the patient's satisfaction.     [place], Ricardo

## 2022-09-01 NOTE — Op Note (Signed)
[facility] Gastroenterology Patient Name: Edgar Huynh Procedure Date: 09/01/2022 10:15 AM MRN: [id number] Account #: [id number] Date of Birth: 1955-10-16 Admit Type: Outpatient Age: 67 Room: [facility] ENDO ROOM 2 Gender: Male Note Status: Finalized Instrument Name: Upper Endoscope [id number] Procedure:             Upper GI endoscopy Indications:           Assessment of patient health (related to previous                         upper GI pathologic condition) for potential impact on                         the management of this patient's other diseases,                         Eosinophilic esophagitis Providers:             Benay Pike. Demia Viera MD, MD Medicines:             Propofol per Anesthesia Complications:         No immediate complications. Procedure:             Pre-Anesthesia Assessment:                        - The risks and benefits of the procedure and the                         sedation options and risks were discussed with the                         patient. All questions were answered and informed                         consent was obtained.                        - Patient identification and proposed procedure were                         verified prior to the procedure by the nurse. The                         procedure was verified in the procedure room.                        - ASA Grade Assessment: III - A patient with severe                         systemic disease.                        - After reviewing the risks and benefits, the patient                         was deemed in satisfactory condition to undergo the                         procedure.  After obtaining informed consent, the endoscope was                         passed under direct vision. Throughout the procedure,                         the patient's blood pressure, pulse, and oxygen                         saturations were monitored continuously. The                          Endosonoscope was introduced through the mouth, and                         advanced to the third part of duodenum. The upper GI                         endoscopy was accomplished without difficulty. The                         patient tolerated the procedure well. Findings:      Mucosal changes including punctate white spots were found in the entire       esophagus. Esophageal findings were graded using the Eosinophilic       Esophagitis Endoscopic Reference Score (EoE-EREFS) as: Edema Grade 0       Normal (distinct vascular markings), Rings Grade 0 None (no ridges or       rings seen), Exudates Grade 1 Mild (scattered white lesions involving       less than 10 percent of the esophageal surface area), Furrows Grade 0       None (no vertical lines seen) and Stricture none (no stricture found).       Biopsies were obtained from the proximal and distal esophagus with cold       forceps for histology of suspected eosinophilic esophagitis. Estimated       blood loss was minimal.      Patchy mild inflammation characterized by congestion (edema) and       erythema was found in the gastric antrum.      A 2 cm hiatal hernia was present.      The examined duodenum was normal.      The exam was otherwise without abnormality. Impression:            - Esophageal mucosal changes consistent with                         eosinophilic esophagitis.                        - Gastritis.                        - 2 cm hiatal hernia.                        - Normal examined duodenum.                        - The examination was otherwise normal.                        -  Biopsies were taken with a cold forceps for                         evaluation of eosinophilic esophagitis. Recommendation:        - Patient has a contact number available for                         emergencies. The signs and symptoms of potential                         delayed complications were discussed with the patient.                          Return to normal activities tomorrow. Written                         discharge instructions were provided to the patient.                        - Resume previous diet.                        - Continue present medications.                        - Await pathology results.                        - Return to physician assistant in 3 months.                        - Follow up with Octavia Bruckner, PA-C in the GI office.                         [phone]                        - Telephone GI office to schedule appointment.                        - The findings and recommendations were discussed with                         the patient. Procedure Code(s):     --- Professional ---                        [phone], Esophagogastroduodenoscopy, flexible,                         transoral; with biopsy, single or multiple Diagnosis Code(s):     --- Professional ---                        W38.8, Eosinophilic esophagitis                        Z87.19, Personal history of other diseases of the                         digestive system  K44.9, Diaphragmatic hernia without obstruction or                         gangrene                        K29.70, Gastritis, unspecified, without bleeding                        K22.89, Other specified disease of esophagus CPT copyright 2022 American [employer] Association. All rights reserved. The codes documented in this report are preliminary and upon coder review may  be revised to meet current compliance requirements. Efrain Sella MD, MD 09/01/2022 10:31:57 AM This report has been signed electronically. Number of Addenda: 0 Note Initiated On: 09/01/2022 10:15 AM Estimated Blood Loss:  Estimated blood loss: none.      [facility]

## 2022-09-01 NOTE — H&P (Signed)
Outpatient short stay form Pre-procedure 09/01/2022 10:17 AM Murat Rideout K. Alice Reichert, M.D.  Primary Physician: Grayland Ormond, PA-C  Reason for visit:  Eosinophilic esophagitis  History of present illness:  Patient has EoE but has been doing well on omeprazole therapy. No dysphagia, breakthrough GERD symptoms. Has some acute onset of "coughing spells with mucus". Recent CXR reportedly negative.     Current Facility-Administered Medications:    0.9 %  sodium chloride infusion, , Intravenous, Continuous, [place], Benay Pike, MD, Last Rate: 20 mL/hr at 09/01/22 0910, New Bag at 09/01/22 0910  Medications Prior to Admission  Medication Sig Dispense Refill Last Dose   amLODipine (NORVASC) 5 MG tablet Take 5 mg by mouth every evening.   08/31/2022   atorvastatin (LIPITOR) 20 MG tablet Take 20 mg by mouth every evening.    08/31/2022   budesonide-formoterol (SYMBICORT) 80-4.5 MCG/ACT inhaler Inhale 2 puffs into the lungs in the morning and at bedtime.   08/31/2022   CALCIUM PO Take 1,200 mg by mouth daily.   08/31/2022   clonazePAM (KLONOPIN) 0.5 MG tablet Take 0.5 mg by mouth 2 (two) times daily as needed for anxiety.   08/31/2022   clopidogrel (PLAVIX) 75 MG tablet Take 75 mg by mouth daily.   08/24/2022   Coenzyme Q10 (COQ-10) 100 MG CAPS Take 100 mg by mouth daily.   08/31/2022   escitalopram (LEXAPRO) 20 MG tablet Take 20 mg by mouth daily.   08/31/2022   furosemide (LASIX) 20 MG tablet Take 20 mg by mouth daily.   08/31/2022   HUMALOG 100 UNIT/ML injection Via Insulin Pump   08/31/2022   Multiple Vitamin (MULTIVITAMIN WITH MINERALS) TABS tablet Take 1 tablet by mouth daily.   08/31/2022   omeprazole (PRILOSEC) 40 MG capsule Take 40 mg by mouth in the morning and at bedtime.   08/31/2022   Potassium 99 MG TABS Take 2 tablets by mouth daily.   08/31/2022   risperiDONE (RISPERDAL) 0.5 MG tablet Take 0.5 mg by mouth at bedtime.   08/31/2022   Alpha-Lipoic Acid 200 MG CAPS Take 200 mg by mouth daily.      apixaban  (ELIQUIS) 2.5 MG TABS tablet Take 1 tablet (2.5 mg total) by mouth 2 (two) times daily. (Patient not taking: Reported on 09/01/2022) 30 tablet 0 Not Taking   apixaban (ELIQUIS) 2.5 MG TABS tablet Take 1 tablet (2.5 mg total) by mouth 2 (two) times daily. (Patient not taking: Reported on 09/01/2022) 30 tablet 0 Not Taking   glucose blood (CONTOUR NEXT TEST) test strip TEST BLOOD SUGAR 7 TIMES A DAY AS DIRECTED      hydrocortisone 2.5 % cream Apply 1 application. topically See admin instructions. Apply on Monday Wednesday and Friday to affected areas of face      ketoconazole (NIZORAL) 2 % cream Apply 1 application. topically See admin instructions. Apply to face Tues Thurs Sat      latanoprost (XALATAN) 0.005 % ophthalmic solution Place 1 drop into both eyes at bedtime.      oxyCODONE (ROXICODONE) 5 MG immediate release tablet Take 1-2 tablets (5-10 mg total) by mouth every 4 (four) hours as needed for moderate pain or severe pain. 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    Physical Exam  Gen: Alert, oriented. Appears stated age.  HEENT: [place]/AT. PERRLA. Lungs: CTA, no wheezes. CV: RR nl S1, S2. Abd: soft, benign, no masses. BS+ Ext: No edema. Pulses 2+    Planned procedures: Proceed with  EGD w/ biopsy. The patient understands the nature of the planned procedure, indications, risks, alternatives and potential complications including but not limited to bleeding, infection, perforation, damage to internal organs and possible oversedation/side effects from anesthesia. The patient agrees and gives consent to proceed.  Please refer to procedure notes for findings, recommendations and patient disposition/instructions.     Rafeef Lau K. Alice Reichert, M.D. Gastroenterology 09/01/2022  10:17 AM

## 2022-09-01 NOTE — Anesthesia Postprocedure Evaluation (Signed)
Anesthesia Post Note  Patient: Edgar Huynh  Procedure(s) Performed: ESOPHAGOGASTRODUODENOSCOPY (EGD)  Patient location during evaluation: PACU Anesthesia Type: General Level of consciousness: awake and alert Pain management: pain level controlled Vital Signs Assessment: post-procedure vital signs reviewed and stable Respiratory status: spontaneous breathing, nonlabored ventilation, respiratory function stable and patient connected to nasal cannula oxygen Cardiovascular status: blood pressure returned to baseline and stable Postop Assessment: no apparent nausea or vomiting Anesthetic complications: no   No notable events documented.   Last Vitals:  Vitals:   09/01/22 0852 09/01/22 1030  BP: (!) 155/73 102/63  Pulse: 94 89  Resp: 18 11  Temp: 36.5 C (!) 36.3 C  SpO2: 96% 93%    Last Pain:  Vitals:   09/01/22 1030  TempSrc: Temporal  PainSc: [place] Joslyne [place]

## 2022-09-01 NOTE — Anesthesia Preprocedure Evaluation (Signed)
Anesthesia Evaluation  Patient identified by MRN, date of birth, ID band Patient awake    Reviewed: Allergy & Precautions, H&P , NPO status , Patient's Chart, lab work & pertinent test results, reviewed documented beta blocker date and time   Airway Mallampati: II   Neck ROM: full    Dental  (+) Poor Dentition   Pulmonary sleep apnea , COPD   Pulmonary exam normal        Cardiovascular Exercise Tolerance: Good hypertension, On Medications + Peripheral Vascular Disease  Normal cardiovascular exam Rhythm:regular Rate:Normal     Neuro/Psych  PSYCHIATRIC DISORDERS Anxiety Depression    TIACVA    GI/Hepatic Neg liver ROS,GERD  ,,  Endo/Other  negative endocrine ROSdiabetes, Well Controlled    Renal/GU negative Renal ROS  negative genitourinary   Musculoskeletal   Abdominal   Peds  Hematology negative hematology ROS (+)   Anesthesia Other Findings Past Medical History: No date: Anxiety No date: COPD (chronic obstructive pulmonary disease) (HCC) No date: Depression No date: GERD (gastroesophageal reflux disease) No date: Glaucoma No date: History of colonic polyps No date: History of kidney stones No date: Hypercholesteremia No date: Hyperlipidemia No date: Hypertension No date: Neuropathy No date: Osteoarthritis of left knee No date: Sleep apnea 12/10/1996: Stroke [facility])     Comment:  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 No date: TIA (transient ischemic attack) No date: Type 1 diabetes (HCC)     Comment:  on insulin pump Past Surgical History: 10/18/2004: CAROTID ARTERY ANGIOPLASTY; Right No date: CATARACT EXTRACTION, BILATERAL; Bilateral No date: COLONOSCOPY 08/25/2018: COLONOSCOPY WITH PROPOFOL; N/A     Comment:  Procedure: COLONOSCOPY WITH PROPOFOL;  Surgeon: Manya Silvas, MD;  Location: [facility] ENDOSCOPY;  Service:               Endoscopy;  Laterality: N/A; No date:  CYSTOSCOPY No date: ESOPHAGOGASTRODUODENOSCOPY 08/25/2018: ESOPHAGOGASTRODUODENOSCOPY (EGD) WITH PROPOFOL; N/A     Comment:  Procedure: ESOPHAGOGASTRODUODENOSCOPY (EGD) WITH               PROPOFOL;  Surgeon: Manya Silvas, MD;  Location:               [facility] ENDOSCOPY;  Service: Endoscopy;  Laterality: N/A; No date: EYE SURGERY 01/05/2022: PARTIAL KNEE ARTHROPLASTY; Left     Comment:  Procedure: UNICOMPARTMENTAL KNEE;  Surgeon: Corky Mull, MD;  Location: [facility] ORS;  Service: Orthopedics;                Laterality: Left; No date: WISDOM TOOTH EXTRACTION BMI    Body Mass Index: 32.36 kg/m     Reproductive/Obstetrics negative OB ROS                             Anesthesia Physical Anesthesia Plan  ASA: 3  Anesthesia Plan: General   Post-op Pain Management:    Induction:   PONV Risk Score and Plan:   Airway Management Planned:   Additional Equipment:   Intra-op Plan:   Post-operative Plan:   Informed Consent: I have reviewed the patients History and Physical, chart, labs and discussed the procedure including the risks, benefits and alternatives for the proposed anesthesia with the patient or authorized representative who has indicated his/her understanding and acceptance.     Dental Advisory Given  Plan Discussed with: CRNA  Anesthesia Plan Comments:        Anesthesia Quick Evaluation

## 2022-09-02 ENCOUNTER — Encounter: Payer: Self-pay | Admitting: Internal Medicine

## 2022-09-02 LAB — SURGICAL PATHOLOGY

## 2022-10-25 ENCOUNTER — Encounter: Payer: Self-pay | Admitting: Dermatology

## 2022-10-25 ENCOUNTER — Ambulatory Visit: Payer: Medicare Other | Admitting: Dermatology

## 2022-10-25 VITALS — BP 130/77 | HR 92

## 2022-10-25 DIAGNOSIS — L719 Rosacea, unspecified: Secondary | ICD-10-CM | POA: Diagnosis not present

## 2022-10-25 DIAGNOSIS — L578 Other skin changes due to chronic exposure to nonionizing radiation: Secondary | ICD-10-CM

## 2022-10-25 DIAGNOSIS — Z79899 Other long term (current) drug therapy: Secondary | ICD-10-CM

## 2022-10-25 DIAGNOSIS — D229 Melanocytic nevi, unspecified: Secondary | ICD-10-CM

## 2022-10-25 DIAGNOSIS — Z1283 Encounter for screening for malignant neoplasm of skin: Secondary | ICD-10-CM

## 2022-10-25 DIAGNOSIS — L821 Other seborrheic keratosis: Secondary | ICD-10-CM

## 2022-10-25 DIAGNOSIS — L219 Seborrheic dermatitis, unspecified: Secondary | ICD-10-CM | POA: Diagnosis not present

## 2022-10-25 DIAGNOSIS — L814 Other melanin hyperpigmentation: Secondary | ICD-10-CM

## 2022-10-25 MED ORDER — KETOCONAZOLE 2 % EX CREA: 1.0000 | TOPICAL_CREAM | CUTANEOUS | 11 refills | Status: DC

## 2022-10-25 MED ORDER — HYDROCORTISONE 2.5 % EX CREA: 1.0000 | TOPICAL_CREAM | CUTANEOUS | 11 refills | Status: DC

## 2022-10-25 NOTE — Patient Instructions (Addendum)
For Seborrheic dermatitis at face  Continue Ketoconazole 2% cream 3 nights a week, Monday, Wednesday and Fridays Continue  Hydrocortisone 2.5% cream/lotion 3 nights a week, Tuesday, Thursday and Saturdays     Seborrheic Keratosis  What causes seborrheic keratoses? Seborrheic keratoses are harmless, common skin growths that first appear during adult life.  As time goes by, more growths appear.  Some people may develop a large number of them.  Seborrheic keratoses appear on both covered and uncovered body parts.  They are not caused by sunlight.  The tendency to develop seborrheic keratoses can be inherited.  They vary in color from skin-colored to gray, brown, or even black.  They can be either smooth or have a rough, warty surface.   Seborrheic keratoses are superficial and look as if they were stuck on the skin.  Under the microscope this type of keratosis looks like layers upon layers of skin.  That is why at times the top layer may seem to fall off, but the rest of the growth remains and re-grows.    Treatment Seborrheic keratoses do not need to be treated, but can easily be removed in the office.  Seborrheic keratoses often cause symptoms when they rub on clothing or jewelry.  Lesions can be in the way of shaving.  If they become inflamed, they can cause itching, soreness, or burning.  Removal of a seborrheic keratosis can be accomplished by freezing, burning, or surgery. If any spot bleeds, scabs, or grows rapidly, please return to have it checked, as these can be an indication of a skin cancer.   Melanoma ABCDEs  Melanoma is the most dangerous type of skin cancer, and is the leading cause of death from skin disease.  You are more likely to develop melanoma if you: Have light-colored skin, light-colored eyes, or red or blond hair Spend a lot of time in the sun Tan regularly, either outdoors or in a tanning bed Have had blistering sunburns, especially during childhood Have a close family  member who has had a melanoma Have atypical moles or large birthmarks  Early detection of melanoma is key since treatment is typically straightforward and cure rates are extremely high if we catch it early.   The first sign of melanoma is often a change in a mole or a new dark spot.  The ABCDE system is a way of remembering the signs of melanoma.  A for asymmetry:  The two halves do not match. B for border:  The edges of the growth are irregular. C for color:  A mixture of colors are present instead of an even brown color. D for diameter:  Melanomas are usually (but not always) greater than 52m - the size of a pencil eraser. E for evolution:  The spot keeps changing in size, shape, and color.  Please check your skin once per month between visits. You can use a small mirror in front and a large mirror behind you to keep an eye on the back side or your body.   If you see any new or changing lesions before your next follow-up, please call to schedule a visit.  Please continue daily skin protection including broad spectrum sunscreen SPF 30+ to sun-exposed areas, reapplying every 2 hours as needed when you're outdoors.   Staying in the shade or wearing long sleeves, sun glasses (UVA+UVB protection) and wide brim hats (4-inch brim around the entire circumference of the hat) are also recommended for sun protection.    Due to  recent changes in healthcare laws, you may see results of your pathology and/or laboratory studies on MyChart before the doctors have had a chance to review them. We understand that in some cases there may be results that are confusing or concerning to you. Please understand that not all results are received at the same time and often the doctors may need to interpret multiple results in order to provide you with the best plan of care or course of treatment. Therefore, we ask that you please give [place] 2 business days to thoroughly review all your results before contacting the office  for clarification. Should we see a critical lab result, you will be contacted sooner.   If You Need Anything After Your Visit  If you have any questions or concerns for your doctor, please call our main line at [phone] and press option 4 to reach your doctor's medical assistant. If no one answers, please leave a voicemail as directed and we will return your call as soon as possible. Messages left after 4 pm will be answered the following business day.   You may also send [place] a message via [place]. We typically respond to MyChart messages within 1-2 business days.  For prescription refills, please ask your pharmacy to contact our office. Our fax number is [phone].  If you have an urgent issue when the clinic is closed that cannot wait until the next business day, you can page your doctor at the number below.    Please note that while we do our best to be available for urgent issues outside of office hours, we are not available 24/7.   If you have an urgent issue and are unable to reach [place], you may choose to seek medical care at your doctor's office, retail clinic, urgent care center, or emergency room.  If you have a medical emergency, please immediately call 911 or go to the emergency department.  Pager Numbers  - Dr. Nehemiah Massed: [phone]  - Dr. Laurence Ferrari: [phone]  - Dr. Nicole Kindred: [phone]  In the event of inclement weather, please call our main line at [phone] for an update on the status of any delays or closures.  Dermatology Medication Tips: Please keep the boxes that topical medications come in in order to help keep track of the instructions about where and how to use these. Pharmacies typically print the medication instructions only on the boxes and not directly on the medication tubes.   If your medication is too expensive, please contact our office at [phone] option 4 or send [place] a message through [place].   We are unable to tell what your co-pay for  medications will be in advance as this is different depending on your insurance coverage. However, we may be able to find a substitute medication at lower cost or fill out paperwork to get insurance to cover a needed medication.   If a prior authorization is required to get your medication covered by your insurance company, please allow [place] 1-2 business days to complete this process.  Drug prices often vary depending on where the prescription is filled and some pharmacies may offer cheaper prices.  The website www.goodrx.com contains coupons for medications through different pharmacies. The prices here do not account for what the cost may be with help from insurance (it may be cheaper with your insurance), but the website can give you the price if you did not use any insurance.  - You can print the associated coupon and take it with your  prescription to the pharmacy.  - You may also stop by our office during regular business hours and pick up a GoodRx coupon card.  - If you need your prescription sent electronically to a different pharmacy, notify our office through [facility] or by phone at [phone] option 4.     Si Usted Necesita Algo Despus de Su Visita  Tambin puede enviarnos un mensaje a travs de Pharmacist, community. Por lo general respondemos a los mensajes de MyChart en el transcurso de 1 a 2 das hbiles.  Para renovar recetas, por favor pida a su farmacia que se ponga en contacto con nuestra oficina. Harland Dingwall de fax es [place] [phone].  Si tiene un asunto urgente cuando la clnica est cerrada y que no puede esperar hasta el siguiente da hbil, puede llamar/localizar a su doctor(a) al nmero que aparece a continuacin.   Por favor, tenga en cuenta que aunque hacemos todo lo posible para estar disponibles para asuntos urgentes fuera del horario de [place], no estamos disponibles las 24 horas del da, los 7 das de la [place].   Si tiene un problema urgente y no puede  comunicarse con nosotros, puede optar por buscar atencin mdica  en el consultorio de su doctor(a), en una clnica privada, en un centro de atencin urgente o en una sala de emergencias.  Si tiene Engineering geologist, por favor llame inmediatamente al 911 o vaya a la sala de emergencias.  Nmeros de bper  - Dr. Nehemiah Massed: [phone]  - Dra. Moye: [phone]  - Dra. Nicole Kindred: [phone]  En caso de inclemencias del [place], por favor llame a Johnsie Kindred principal al [phone] para una actualizacin sobre el [place] de cualquier retraso o cierre.  Consejos para la medicacin en dermatologa: Por favor, guarde las cajas en las que vienen los medicamentos de uso tpico para ayudarle a seguir las instrucciones sobre dnde y cmo usarlos. Las farmacias generalmente imprimen las instrucciones del medicamento slo en las cajas y no directamente en los tubos del [place].   Si su medicamento es muy caro, por favor, pngase en contacto con Zigmund Daniel llamando al [phone] y presione la opcin 4 o envenos un mensaje a travs de Pharmacist, community.   No podemos decirle cul ser su copago por los medicamentos por adelantado ya que esto es diferente dependiendo de la cobertura de su seguro. Sin embargo, es posible que podamos encontrar un medicamento sustituto a Electrical engineer un formulario para que el seguro cubra el medicamento que se considera necesario.   Si se requiere una autorizacin previa para que su compaa de seguros [place] su medicamento, por favor permtanos de 1 a 2 das hbiles para completar este proceso.  Los precios de los medicamentos varan con frecuencia dependiendo del Environmental consultant de dnde se surte la receta y alguna farmacias pueden ofrecer precios ms baratos.  El sitio web www.goodrx.com tiene cupones para medicamentos de Airline pilot. Los precios aqu no tienen en cuenta lo que podra costar con la ayuda del seguro (puede ser ms barato con su seguro), pero  el sitio web puede darle el precio si no utiliz Research scientist (physical sciences).  - Puede imprimir el cupn correspondiente y llevarlo con su receta a la farmacia.  - Tambin puede pasar por nuestra oficina durante el horario de atencin regular y Charity fundraiser una tarjeta de cupones de GoodRx.  - Si necesita que su receta se enve electrnicamente a Chiropodist, informe a nuestra oficina a travs de [place] o  por telfono llamando al [phone] y presione la opcin 4.

## 2022-10-25 NOTE — Progress Notes (Signed)
Follow-Up Visit   Subjective  Edgar Huynh is a 67 y.o. male who presents for the following: Annual Exam (1 year tbse, hx of seb derm). The patient presents for Total-Body Skin Exam (TBSE) for skin cancer screening and mole check.  The patient has spots, moles and lesions to be evaluated, some may be new or changing and the patient has concerns that these could be cancer.  The following portions of the chart were reviewed this encounter and updated as appropriate:  Tobacco  Allergies  Meds  Problems  Med Hx  Surg Hx  Fam Hx     Review of Systems: No other skin or systemic complaints except as noted in HPI or Assessment and Plan.  Objective  Well appearing patient in no apparent distress; mood and affect are within normal limits.  A full examination was performed including scalp, head, eyes, ears, nose, lips, neck, chest, axillae, abdomen, back, buttocks, bilateral upper extremities, bilateral lower extremities, hands, feet, fingers, toes, fingernails, and toenails. All findings within normal limits unless otherwise noted below.   Assessment & Plan  Seborrheic dermatitis Head - Anterior (Face) face Seborrheic Dermatitis  -  is a chronic persistent rash characterized by pinkness and scaling most commonly of the mid face but also can occur on the scalp (dandruff), ears; mid chest, mid back and groin.  It tends to be exacerbated by stress and cooler weather.  People who have neurologic disease may experience new onset or exacerbation of existing seborrheic dermatitis.  The condition is not curable but treatable and can be controlled.   Start Ketoconazole 2% crean - apply topically to affected areas of face at bedtime on T- Thur - Sat weekly.  Continue Hydrocrotisone 2.5 % Cream - apply topically to affected areas of face at bedtime on M - W - Thur weekly    Rx sent to [facility], 11 rfs   Topical steroids (such as triamcinolone, fluocinolone, fluocinonide, mometasone,  clobetasol, halobetasol, betamethasone, hydrocortisone) can cause thinning and lightening of the skin if they are used for too long in the same area. Your physician has selected the right strength medicine for your problem and area affected on the body. Please use your medication only as directed by your physician to prevent side effects.   ketoconazole (NIZORAL) 2 % cream - Head - Anterior (Face) Apply 1 Application topically See admin instructions. Apply to face Tues Thurs Sat hydrocortisone 2.5 % cream - Head - Anterior (Face) Apply 1 Application topically See admin instructions. Apply on Monday Wednesday and Friday to affected areas of face  Rosacea face Rosacea is a chronic progressive skin condition usually affecting the face of adults, causing redness and/or acne bumps. It is treatable but not curable. It sometimes affects the eyes (ocular rosacea) as well. It may respond to topical and/or systemic medication and can flare with stress, sun exposure, alcohol, exercise, topical steroids (including hydrocortisone/cortisone 10) and some foods.  Daily application of broad spectrum spf 30+ sunscreen to face is recommended to reduce flares.  Counseling for BBL / IPL / Laser and Coordination of Care Discussed the treatment option of Broad Band Light (BBL) /Intense Pulsed Light (IPL)/ Laser for skin discoloration, including brown spots and redness.  Typically we recommend at least 1-3 treatment sessions about 5-8 weeks apart for best results.  Cannot have tanned skin when BBL performed, and regular use of sunscreen is advised after the procedure to help maintain results. The patient's condition may also require "maintenance treatments"  in the future.  The fee for BBL / laser treatments is $350 per treatment session for the whole face.  A fee can be quoted for other parts of the body.  Insurance typically does not pay for BBL/laser treatments and therefore the fee is an out-of-pocket cost.  Lentigines -  Scattered tan macules - Due to sun exposure - Benign-appearing, observe - Recommend daily broad spectrum sunscreen SPF 30+ to sun-exposed areas, reapply every 2 hours as needed. - Call for any changes  Seborrheic Keratoses - Stuck-on, waxy, tan-brown papules and/or plaques  - Benign-appearing - Discussed benign etiology and prognosis. - Observe - Call for any changes  Melanocytic Nevi - Tan-brown and/or pink-flesh-colored symmetric macules and papules - Benign appearing on exam today - Observation - Call clinic for new or changing moles - Recommend daily use of broad spectrum spf 30+ sunscreen to sun-exposed areas.   Telangiectasia nose - Dilated blood vessel - Benign appearing on exam - Call for changes Counseling for BBL / IPL / Laser and Coordination of Care Discussed the treatment option of Broad Band Light (BBL) /Intense Pulsed Light (IPL)/ Laser for skin discoloration, including brown spots and redness.  Typically we recommend at least 1-3 treatment sessions about 5-8 weeks apart for best results.  Cannot have tanned skin when BBL performed, and regular use of sunscreen is advised after the procedure to help maintain results. The patient's condition may also require "maintenance treatments" in the future.  The fee for BBL / laser treatments is $350 per treatment session for the whole face.  A fee can be quoted for other parts of the body.  Insurance typically does not pay for BBL/laser treatments and therefore the fee is an out-of-pocket cost.  Hemangiomas - Red papules - Discussed benign nature - Observe - Call for any changes  Actinic Damage - Chronic condition, secondary to cumulative UV/sun exposure - diffuse scaly erythematous macules with underlying dyspigmentation - Recommend daily broad spectrum sunscreen SPF 30+ to sun-exposed areas, reapply every 2 hours as needed.  - Staying in the shade or wearing long sleeves, sun glasses (UVA+UVB protection) and wide brim  hats (4-inch brim around the entire circumference of the hat) are also recommended for sun protection.  - Call for new or changing lesions.  History of PreCancerous Actinic Keratosis  Left temple - site(s) of PreCancerous Actinic Keratosis clear today. - these may recur and new lesions may form requiring treatment to prevent transformation into skin cancer - observe for new or changing spots and contact [place] for appointment if occur - photoprotection with sun protective clothing; sunglasses and broad spectrum sunscreen with SPF of at least 30 + and frequent self skin exams recommended - yearly exams by a dermatologist recommended for persons with history of PreCancerous Actinic Keratoses  Skin cancer screening performed today. Return in about 1 year (around 10/25/2023) for TBSE. IRuthell Rummage, CMA, am acting as scribe for Sarina Ser, MD.. Documentation: I have reviewed the above documentation for accuracy and completeness, and I agree with the above.  Sarina Ser, MD

## 2022-10-26 ENCOUNTER — Encounter: Payer: Self-pay | Admitting: Dermatology

## 2022-11-09 ENCOUNTER — Other Ambulatory Visit
Admission: RE | Admit: 2022-11-09 | Discharge: 2022-11-09 | Disposition: A | Discharge status: Home / Self Care | Payer: Medicare Other | Source: Ambulatory Visit | Attending: Psychiatry | Admitting: Psychiatry

## 2022-11-09 ENCOUNTER — Encounter: Payer: Self-pay | Admitting: Psychiatry

## 2022-11-09 ENCOUNTER — Ambulatory Visit: Payer: Medicare Other | Admitting: Psychiatry

## 2022-11-09 VITALS — BP 138/73 | HR 94 | Temp 98.0°F | Ht 68.0 in | Wt 205.6 lb

## 2022-11-09 DIAGNOSIS — F410 Panic disorder [episodic paroxysmal anxiety] without agoraphobia: Secondary | ICD-10-CM

## 2022-11-09 DIAGNOSIS — F3341 Major depressive disorder, recurrent, in partial remission: Secondary | ICD-10-CM | POA: Diagnosis not present

## 2022-11-09 LAB — TSH: TSH: 1.582 u[IU]/mL (ref 0.350–4.500)

## 2022-11-09 MED ORDER — TRAZODONE HCL 50 MG PO TABS: 25.0000 mg | ORAL_TABLET | Freq: Every evening | ORAL | 1 refills | Status: DC | PRN

## 2022-11-09 MED ORDER — BUSPIRONE HCL 30 MG PO TABS: 30.0000 mg | ORAL_TABLET | Freq: Two times a day (BID) | ORAL | 1 refills | Status: DC

## 2022-11-09 NOTE — Patient Instructions (Addendum)
Continue lexapro 20 mg daily  Increase buspirone 15 mg twice a day  Start Trazodone 25-50 mg at night as needed for insomnia Obtain TSH Next appointment: 5/14 at 1:30

## 2022-11-09 NOTE — Progress Notes (Signed)
Psychiatric Initial Adult Assessment   Patient Identification: Edgar Huynh MRN:  [id number] Date of Evaluation:  11/09/2022 Referral Source: [facility],*  Chief Complaint:   Chief Complaint  Patient presents with   Establish Care   Visit Diagnosis:    ICD-10-CM   1. Panic disorder  F41.0 TSH    2. MDD (major depressive disorder), recurrent, in partial remission (HCC)  F33.41       History of Present Illness:   Edgar Huynh is a 67 y.o. year old male with a history of depression, anxiety, Complex motor tics, type I diabetes, hypertension, hyperlipidemia,  s/p TIA in 20's, OSA on CPAP, OA, eosinophilic esophagitis, who is referred for depression.   Per chart review, he was seen by neurology.  "Complex motor tics: psychomotor hyperactivity/involuntary rhythmic movements in toes + migratory muscle twitching in upper extremity + lower extremity - with increased movement in his legs (long standing since childhood) with recent torticollis; (did not respond to Sinemet or Ropinirole; these medications made his symptoms worse) in patient with right hand cogwheeling with contralateral activation + positive family history of Parkinson's Disease (Father) - Patient has been tried on Baclofen, Rivastigmine, Ropinirole, Sinemet, Gabapentin, Pregabalin, guanfacine Continue risperidone 1 mg by mouth three times a day - Discussed risks of increasing risperidone dose such as tardive dyskinesia, glucose resistant, weight gain"  He states that he cannot stop worrying.  He thinks his anxiety has significantly worsened since starting to take care of his mother.  Although his mother used to live with him, she moved in to the nursing home a few weeks ago  She had a fall, fracture and stroke when she was living with him.  He could not continue to take care of her anymore. Although he feels a little relief since she moved to nursing home, it "broke her heart" when he had a conversation about her  going to nursing care.  He states that the anxiety is centered around her, and states that "it is not something happen to her, but I am just worried." He states that he is "conditioned to worry," although there is nothing to worried about. He tends to feel anxious whenever he sees his mother.  Whenever she asks him when she can be back home, he has panic attacks.  He states that he has significant hypertension, shortness of breath with intense anxiety, which lasts for 5-10 minutes when he has panic attacks. Although he wants to see her, it has been difficult due to worsening in anxiety when he sees her.   He feels anxiety from waking up.  He is worried about him needing to deal with Medicaid application, and a variety of things around his mother.  His mind cannot let go of these thoughts, and he tends to think the worst case scenario.   He states that the "sleep" is the best time as she is not worried. He feels sick and tired of anxiety, and states that he feels depressed due to this. He reports good relationship with his wife.  They do love and help with each other.  He denies significant anxiety outside of the issues related to his mother, although he reports stress of taking care of his wife for transportation.   Depression-he has depressive symptoms as in PHQ-9.  He has occasional initial insomnia, and takes diphenhydramine with good effect while having adverse reaction of visual disturbances. He has fair focus, and reports good appetite. He denies SI.   Tremor-he  has tremors involving his leg and arm.  He has been on Risperdal due to this.  He has an appointment at [facility] in 2 months.   Medication- Lexapro 20 mg daily, Buspirone 10 mg tid, clonazepam 0.5 mg twice a day (since 20's. 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  He states that although they were not rich, his parents provided enough to him.    Wt Readings from Last 3 Encounters:  11/09/22 205 lb 9.6 oz (93.3 kg)  09/01/22 212 lb 12.8 oz (96.5 kg)  01/05/22 191 lb (86.6 kg)     Associated Signs/Symptoms: Depression Symptoms:  depressed mood, insomnia, anxiety, panic attacks, (Hypo) Manic Symptoms:   denies decreased need for sleep, increased goal directed activities Anxiety Symptoms:  Excessive Worry, Psychotic Symptoms:   denies AH, VH, paranoia PTSD Symptoms: Negative  Past Psychiatric History:  Outpatient:  Psychiatry admission: in 2018 for depression with psychotic features  Previous suicide attempt: denies Past trials of medication: lexapro, Buspar, bupropion, duloxetine, gabapentin (for neuropathy), clonazepam History of violence:  History of head injury:   Previous Psychotropic Medications: Yes   Substance Abuse History in the last 12 months:  No.  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   TIA (transient ischemic attack)    Type 1 diabetes ([place])    on insulin pump    Past Surgical History:  Procedure Laterality Date   CAROTID ARTERY ANGIOPLASTY Right 10/18/2004   CATARACT EXTRACTION, BILATERAL Bilateral    COLONOSCOPY     COLONOSCOPY WITH PROPOFOL N/A 08/25/2018   Procedure: COLONOSCOPY WITH PROPOFOL;   Surgeon: Manya Silvas, MD;  [facility] END[facility]COPY;  Service: Endoscopy;  Laterality: N/A;   CYSTOSCOPY     ESOPHAGOGASTRODUODENOSCOPY     ESOPHAGOGASTRODUODENOSCOPY N/A 09/01/2022   Procedure: ESOPHAGOGASTRODUODENOSCOPY (EGD);  Surgeon: Toledo, Benay Pike, MD;  Location: [facility] ENDOSCOPY;  Service: Gastroenterology;  Laterality: N/A;   ESOPHAGOGASTRODUODENOSCOPY (EGD) WITH PROPOFOL N/A 08/25/2018   Procedure: ESOPHAGOGASTRODUODENOSCOPY (EGD) WITH PROPOFOL;  Surgeon: Manya Silvas, MD;  Location: [facility] ENDOSCOPY;  Service: Endoscopy;  Laterality: N/A;   EYE SURGERY     PARTIAL KNEE ARTHROPLASTY Left 01/05/2022   Procedure: UNICOMPARTMENTAL KNEE;  Surgeon: Corky Mull, MD;  Location: [facility] ORS;  Service: Orthopedics;  Laterality: Left;   WISDOM TOOTH EXTRACTION      Family Psychiatric History: as below  Family History:  Family History  Problem Relation Age of Onset   Parkinson's disease Father    Diabetes Mellitus II Brother     Social History:   Social History   Socioeconomic History   Marital status: Married    Spouse name: Marcie Bal  Number of children: 2   Years of education: Not on file   Highest education level: Some college, no degree  Occupational History   Not on file  Tobacco Use   Smoking status: Never   Smokeless tobacco: Never  Vaping Use   Vaping Use: Never used  Substance and Sexual Activity   Alcohol use: Not Currently    Comment: occasional   Drug use: Never   Sexual activity: Not on file    Comment: not asked if sexually active  Other Topics Concern   Not on file  Social History Narrative   Not on file   Social Determinants of Health   Financial Resource Strain: Not on file  Food Insecurity: Not on file  Transportation Needs: Not on file  Physical Activity: Not on file  Stress: Not on file  Social Connections: Not on file    Additional Social History: as above  Allergies:   Allergies  Allergen Reactions   Other Rash    Clonidine 0.3  mg patch (adhesive caused the irritation)    Metabolic Disorder Labs: Lab Results  Component Value Date   HGBA1C 7.2 (H) 04/09/2021   MPG 159.94 04/09/2021   MPG 151 03/02/2017   Lab Results  Component Value Date   PROLACTIN 25.6 (H) 03/02/2017   Lab Results  Component Value Date   CHOL 151 04/10/2021   TRIG 134 04/10/2021   HDL 46 04/10/2021   CHOLHDL 3.3 04/10/2021   VLDL 27 04/10/2021   LDLCALC 78 04/10/2021   LDLCALC 119 (H) 03/02/2017   Lab Results  Component Value Date   TSH 1.582 11/09/2022    Therapeutic Level Labs: No results found for: "LITHIUM" No results found for: "CBMZ" No results found for: "VALPROATE"  Current Medications: Current Outpatient Medications  Medication Sig Dispense Refill   Alpha-Lipoic Acid 200 MG CAPS Take 200 mg by mouth daily.     amLODipine (NORVASC) 5 MG tablet Take 5 mg by mouth every evening.     apixaban (ELIQUIS) 2.5 MG TABS tablet Take 1 tablet (2.5 mg total) by mouth 2 (two) times daily. 30 tablet 0   apixaban (ELIQUIS) 2.5 MG TABS tablet Take 1 tablet (2.5 mg total) by mouth 2 (two) times daily. 30 tablet 0   atorvastatin (LIPITOR) 20 MG tablet Take 20 mg by mouth every evening.      budesonide-formoterol (SYMBICORT) 80-4.5 MCG/ACT inhaler Inhale 2 puffs into the lungs in the morning and at bedtime.     busPIRone (BUSPAR) 30 MG tablet Take 1 tablet (30 mg total) by mouth 2 (two) times daily. 60 tablet 1   CALCIUM PO Take 1,200 mg by mouth daily.     clonazePAM (KLONOPIN) 0.5 MG tablet Take 0.5 mg by mouth 2 (two) times daily as needed for anxiety.     clopidogrel (PLAVIX) 75 MG tablet Take 75 mg by mouth daily.     Coenzyme Q10 (COQ-10) 100 MG CAPS Take 100 mg by mouth daily.     escitalopram (LEXAPRO) 20 MG tablet Take 20 mg by mouth daily.     furosemide (LASIX) 20 MG tablet Take 20 mg by mouth daily.     glucose blood (CONTOUR NEXT TEST) test strip TEST BLOOD SUGAR 7 TIMES A DAY AS DIRECTED     HUMALOG 100 UNIT/ML  injection Via Insulin Pump     hydrocortisone 2.5 % cream Apply 1 Application topically See admin instructions. Apply on Monday Wednesday and Friday to affected areas of face  30 g 11   ketoconazole (NIZORAL) 2 % cream Apply 1 Application topically See admin instructions. Apply to face Tues Thurs Sat 30 g 11   latanoprost (XALATAN) 0.005 % ophthalmic solution Place 1 drop into both eyes at bedtime.     Multiple Vitamin (MULTIVITAMIN WITH MINERALS) TABS tablet Take 1 tablet by mouth daily.     omeprazole (PRILOSEC) 40 MG capsule Take 40 mg by mouth in the morning and at bedtime.     oxyCODONE (ROXICODONE) 5 MG immediate release tablet Take 1-2 tablets (5-10 mg total) by mouth every 4 (four) hours as needed for moderate pain or severe pain. 40 tablet 0   Potassium 99 MG TABS Take 2 tablets by mouth daily.     risperiDONE (RISPERDAL) 0.5 MG tablet Take 0.5 mg by mouth at bedtime.     traZODone (DESYREL) 50 MG tablet Take 0.5-1 tablets (25-50 mg total) by mouth at bedtime as needed for sleep. 30 tablet 1   No current facility-administered medications for this visit.    Musculoskeletal: Strength & Muscle Tone: within normal limits Gait & Station: normal Patient leans: N/A  Psychiatric Specialty Exam: Review of Systems  Psychiatric/Behavioral:  Positive for dysphoric mood and sleep disturbance. Negative for agitation, behavioral problems, confusion, decreased concentration, hallucinations, self-injury and suicidal ideas. The patient is nervous/anxious. The patient is not hyperactive.   All other systems reviewed and are negative.   Blood pressure 138/73, pulse 94, temperature 98 F (36.7 C), temperature source Skin, height 5\' 8"  (1.727 m), weight 205 lb 9.6 oz (93.3 kg).Body mass index is 31.26 kg/m.  General Appearance: Fairly Groomed  Eye Contact:  Good  Speech:  Clear and Coherent  Volume:  Normal  Mood:  Anxious  Affect:  Appropriate, Congruent, and slightly tense  Thought Process:   Coherent and Goal Directed  Orientation:  Full (Time, Place, and Person)  Thought Content:  Logical  Suicidal Thoughts:  No  Homicidal Thoughts:  No  Memory:  Immediate;   Good  Judgement:  Good  Insight:  Good  Psychomotor Activity:  Normal  Concentration:  Concentration: Good and Attention Span: Good  Recall:  Good  Fund of Knowledge:Good  Language: Good  Akathisia:  No  Handed:  Right  AIMS (if indicated):  not done  Assets:  Communication Skills Desire for Improvement  ADL's:  Intact  Cognition: WNL  Sleep:  Poor   Screenings: AIMS    Flowsheet Row Admission (Discharged) from 03/01/2017 in [facility] Total Score 0      AUDIT    Flowsheet Row Admission (Discharged) from 03/01/2017 in [facility]  Alcohol Use Disorder Identification Test Final Score (AUDIT) 0      GAD-7    Flowsheet Row Office Visit from 11/09/2022 in [place]  Total GAD-7 Score 9      PHQ2-9    [place] Office Visit from 11/09/2022 in [place] [facility] from 08/26/2021 in [place] at [facility] Total Score 2 0  PHQ-9 Total Score 6 --      Flowsheet Row Admission (Discharged) from 09/01/2022 in [facility] [place] [facility] from 12/25/2021 in [facility] to Hosp-Admission (Discharged) from 04/09/2021 in [facility]  C-SSRS RISK CATEGORY No Risk No Risk No Risk  Assessment and Plan:  Edgar Huynh is a 67 y.o. year old male with a history of depression, anxiety, Complex motor tics, type I diabetes, hypertension, hyperlipidemia,  s/p TIA in 20's, OSA on CPAP, OA, eosinophilic esophagitis, who is referred for anxiety.   1. Panic disorder 2. MDD (major depressive  disorder), recurrent, in partial remission ([place]) # r/o GAD Acute stressors include: Her mother moved into nursing home due to him having difficulty taking care of her at home/recent incidents of strokes and fractures. Other stressors include: Takes care of his wife, who uses a walker. Retirement (he describes satisfaction in this)    History: anxiety for many years. 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    There has been significant worsening in panic attacks and anxiety in relation to her mother over the past several months in the context of stressors as above.  After having discussed treatment options, he is willing to uptitrate BuSpar to optimize treatment for anxiety.  Will consider switching from Lexapro to other antidepressant such as venlafaxine in the future if he has limited benefit from this uptitration.  Although he is recommended for CBT, he would like to hold the option at this time, although he may be open in the future.  Will continue to discuss.  Noted that he is on Risperdal, prescribed by his neurologist.  He has an upcoming appointment with [facility] neurology; he was advised to discuss whether or not this medication can be discontinued to minimize risk of akathisia.  Will obtain TSH to rule out medical health issues contributing to his mood symptoms.   # Insomnia - on CPAP machine He reports initial insomnia, and reports history of diplopia after trying diphenhydramine.  Will start trazodone as needed for insomnia.  Discussed potential risk of drowsiness.   # benzodiazepine dependence - on clonazepam 0.5 mg BID since 20's He is advised this medication to be tapered off in the future to avoid long-term risks.  Plan Continue lexapro 20 mg daily  Increase buspirone 15 mg twice a day (currently takes 10 mg TID) Start Trazodone 25-50 mg at night as needed for insomnia Obtain TSH Next appointment: 5/14 at 1:30 for 30 mins. IP - on risperidone 0.5 mg  qhs, prescribed by neurology   Collaboration of Care: Other reviewed notes in Epic  Patient/Guardian was advised Release of Information must be obtained prior to any record release in order to collaborate their care with an outside provider. Patient/Guardian was advised if they have not already done so to contact the registration department to sign all necessary forms in order for [place] to release information regarding their care.   Consent: Patient/Guardian gives verbal consent for treatment and assignment of benefits for services provided during this visit. Patient/Guardian expressed understanding and agreed to proceed.   Norman Clay, MD 3/26/202412:53 PM

## 2022-12-16 ENCOUNTER — Ambulatory Visit: Payer: Medicare Other | Admitting: Psychiatry

## 2022-12-26 NOTE — Progress Notes (Unsigned)
BH MD/PA/NP OP Progress Note  12/28/2022 2:03 PM Edgar Huynh  MRN:  [id number]  Chief Complaint:  Chief Complaint  Patient presents with   Follow-up   HPI:  This is a follow-up appointment for panic disorder, depression and insomnia.  He states that he has been doing better.  He lost his mother.  Although he used to be worried about her, he does not need to be worried as she is with her father and others now.  He is planning to use a swimming pool in the house more often.  He denies feeling depressed.  He sleeps better since starting trazodone.  He denies change in appetite.  He denies SI.  He feels comfortable to stay on the current medication regimen.  Of note, he has an upcoming appointment with his neurologist, and has agreed to consider possible discontinuation for Risperdal.  He states that although it helped for his some twitches in his upper body, he has worsening on his leg.  He also restless leg at night.   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  He states that although they were not rich, his parents provided enough to him.   Substance use  Tobacco Alcohol Other substances/  Current denies denies denies  Past denies 12 pack on weekend, not since 2018 denies  Past Treatment        Wt Readings from Last 3 Encounters:  12/28/22 198 lb 3.2 oz (89.9 kg)  11/09/22 205 lb 9.6 oz (93.3 kg)  09/01/22 212 lb 12.8 oz (96.5 kg)     Visit Diagnosis:    ICD-10-CM   1. Panic disorder  F41.0     2. MDD (major depressive disorder), recurrent, in partial remission (HCC)  F33.41     3. Insomnia, unspecified type  G47.00     4. Restless leg syndrome  G25.81 Ferritin      Past Psychiatric  History: Please see initial evaluation for full details. I have reviewed the history. No updates at this time.     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   TIA (transient ischemic attack)    Type 1 diabetes (HCC)    on insulin pump    Past Surgical History:  Procedure Laterality Date   CAROTID ARTERY ANGIOPLASTY Right 10/18/2004   CATARACT EXTRACTION, BILATERAL Bilateral    COLONOSCOPY     COLONOSCOPY WITH PROPOFOL N/A 08/25/2018   Procedure: COLONOSCOPY WITH PROPOFOL;  Surgeon: Scot Jun, MD;  Location: [facility] ENDOSCOPY;  Service: Endoscopy;  Laterality: N/A;   CYSTOSCOPY     ESOPHAGOGASTRODUODENOSCOPY     ESOPHAGOGASTRODUODENOSCOPY N/A 09/01/2022   Procedure: ESOPHAGOGASTRODUODENOSCOPY (EGD);  Surgeon: Toledo, Boykin Nearing, MD;  Location: [facility] ENDOSCOPY;  Service: Gastroenterology;  Laterality: N/A;   ESOPHAGOGASTRODUODENOSCOPY (EGD) WITH PROPOFOL N/A 08/25/2018   Procedure: ESOPHAGOGASTRODUODENOSCOPY (EGD) WITH PROPOFOL;  Surgeon: Scot Jun, MD;  Location: [facility] ENDOSCOPY;  Service: Endoscopy;  Laterality: N/A;   EYE SURGERY     PARTIAL KNEE ARTHROPLASTY Left 01/05/2022   Procedure: UNICOMPARTMENTAL KNEE;  Surgeon: Christena Flake, MD;  Location: [facility] ORS;  Service: Orthopedics;  Laterality: Left;   WISDOM TOOTH EXTRACTION      Family Psychiatric History: Please see initial evaluation for full details. I have reviewed the history. No updates at this time.     Family History:  Family History  Problem Relation Age of Onset   Parkinson's disease Father     Diabetes Mellitus II Brother     Social History:  Social History   Socioeconomic History   Marital status: Married    Spouse name: [name]   Number of children: 2   Years of education: Not on file   Highest education level: Some college, no degree  Occupational History   Not on file  Tobacco Use   Smoking status: Never   Smokeless tobacco: Never  Vaping Use   Vaping Use: Never used  Substance and Sexual Activity   Alcohol use: Not Currently    Comment: occasional   Drug use: Never   Sexual activity: Not on file    Comment: not asked if sexually active  Other Topics Concern   Not on file  Social History Narrative   Not on file   Social Determinants of Health   Financial Resource Strain: Not on file  Food Insecurity: Not on file  Transportation Needs: Not on file  Physical Activity: Not on file  Stress: Not on file  Social Connections: Not on file    Allergies:  Allergies  Allergen Reactions   Other Rash    Clonidine 0.3 mg patch (adhesive caused the irritation)    Metabolic Disorder Labs: Lab Results  Component Value Date   HGBA1C 7.2 (H) 04/09/2021   MPG 159.94 04/09/2021   MPG 151 03/02/2017   Lab Results  Component Value Date   PROLACTIN 25.6 (H) 03/02/2017   Lab Results  Component Value Date   CHOL 151 04/10/2021   TRIG 134 04/10/2021   HDL 46 04/10/2021   CHOLHDL 3.3 04/10/2021   VLDL 27 04/10/2021   LDLCALC 78 04/10/2021   LDLCALC 119 (H) 03/02/2017   Lab Results  Component Value Date   TSH 1.582 11/09/2022   TSH 0.937 04/10/2021    Therapeutic Level Labs: No results found for: "LITHIUM" No results found for: "VALPROATE" No results found for: "CBMZ"  Current Medications: Current Outpatient Medications  Medication Sig Dispense Refill   Alpha-Lipoic Acid 200 MG CAPS Take 200 mg by mouth daily.     amLODipine (NORVASC) 5 MG tablet Take 5 mg by mouth every evening.     apixaban (ELIQUIS) 2.5 MG TABS tablet Take 1 tablet (2.5 mg total)  by mouth 2 (two) times daily. 30 tablet 0   apixaban (ELIQUIS) 2.5 MG TABS tablet Take 1 tablet (2.5 mg total) by mouth 2 (two) times daily. 30 tablet 0   atorvastatin (LIPITOR) 20 MG tablet Take 20 mg by mouth every evening.      budesonide-formoterol (SYMBICORT) 80-4.5 MCG/ACT inhaler Inhale 2 puffs into the lungs in the morning and at bedtime.     busPIRone (BUSPAR) 30 MG tablet Take 1 tablet (30 mg total) by mouth 2 (two) times daily. 60 tablet 1  CALCIUM PO Take 1,200 mg by mouth daily.     clonazePAM (KLONOPIN) 0.5 MG tablet Take 0.5 mg by mouth 2 (two) times daily as needed for anxiety.     clopidogrel (PLAVIX) 75 MG tablet Take 75 mg by mouth daily.     Coenzyme Q10 (COQ-10) 100 MG CAPS Take 100 mg by mouth daily.     escitalopram (LEXAPRO) 20 MG tablet Take 20 mg by mouth daily.     furosemide (LASIX) 20 MG tablet Take 20 mg by mouth daily.     glucose blood (CONTOUR NEXT TEST) test strip TEST BLOOD SUGAR 7 TIMES A DAY AS DIRECTED     HUMALOG 100 UNIT/ML injection Via Insulin Pump     hydrocortisone 2.5 % cream Apply 1 Application topically See admin instructions. Apply on Monday Wednesday and Friday to affected areas of face 30 g 11   ketoconazole (NIZORAL) 2 % cream Apply 1 Application topically See admin instructions. Apply to face Tues Thurs Sat 30 g 11   latanoprost (XALATAN) 0.005 % ophthalmic solution Place 1 drop into both eyes at bedtime.     Multiple Vitamin (MULTIVITAMIN WITH MINERALS) TABS tablet Take 1 tablet by mouth daily.     omeprazole (PRILOSEC) 40 MG capsule Take 40 mg by mouth in the morning and at bedtime.     oxyCODONE (ROXICODONE) 5 MG immediate release tablet Take 1-2 tablets (5-10 mg total) by mouth every 4 (four) hours as needed for moderate pain or severe pain. 40 tablet 0   Potassium 99 MG TABS Take 2 tablets by mouth daily.     risperiDONE (RISPERDAL) 0.5 MG tablet Take 0.5 mg by mouth at bedtime.     traZODone (DESYREL) 50 MG tablet Take 0.5-1 tablets  (25-50 mg total) by mouth at bedtime as needed for sleep. 30 tablet 1   No current facility-administered medications for this visit.     Musculoskeletal: Strength & Muscle Tone: within normal limits Gait & Station: normal Patient leans: N/A  Psychiatric Specialty Exam: Review of Systems  Psychiatric/Behavioral:  Negative for agitation, behavioral problems, confusion, decreased concentration, dysphoric mood, hallucinations, self-injury, sleep disturbance and suicidal ideas. The patient is nervous/anxious. The patient is not hyperactive.   All other systems reviewed and are negative.   Blood pressure 128/75, pulse 97, temperature 97.8 F (36.6 C), temperature source Skin, height 5\' 8"  (1.727 m), weight 198 lb 3.2 oz (89.9 kg).Body mass index is 30.14 kg/m.  General Appearance: Fairly Groomed  Eye Contact:  Good  Speech:  Clear and Coherent  Volume:  Normal  Mood:   good  Affect:  Appropriate, Congruent, and Full Range  Thought Process:  Coherent  Orientation:  Full (Time, Place, and Person)  Thought Content: Logical   Suicidal Thoughts:  No  Homicidal Thoughts:  No  Memory:  Immediate;   Good  Judgement:  Good  Insight:  Good  Psychomotor Activity:  Normal  Concentration:  Concentration: Good and Attention Span: Good  Recall:  Good  Fund of Knowledge: Good  Language: Good  Akathisia:  No  Handed:  Right  AIMS (if indicated): not done  Assets:  Communication Skills Desire for Improvement  ADL's:  Intact  Cognition: WNL  Sleep:  Good   Screenings: AIMS    Flowsheet Row [facility]  AIMS Total Score 0      AUDIT    Flowsheet Row [facility]  Alcohol Use Disorder Identification Test Final Score (AUDIT) 0      GAD-7    Flowsheet Row Office Visit from 11/09/2022 [facility]  Total GAD-7 Score 9      PHQ2-9    Flowsheet Row Office Visit from 11/09/2022 in [facility] Nutrition from 08/26/2021 in [facility] Total Score 2 0  PHQ-9 Total Score 6 --      Flowsheet Row Admission (Discharged) from 09/01/2022 in [facility] Pre-Admission Testing 60 from 12/25/2021 in [facility] PRE ADMISSION TESTING ED to Hosp-Admission (Discharged) from 04/09/2021 in [facility]  C-SSRS RISK CATEGORY No Risk No Risk No Risk        Assessment and Plan:  REDRICK NORDAHL is a 67 y.o. year old male with a history of depression, anxiety, Complex motor tics, type I diabetes, hypertension, hyperlipidemia,  s/p TIA in 20's, OSA on CPAP, OA, eosinophilic esophagitis, who is referred for anxiety.    1. Panic disorder 2. MDD (major depressive disorder), recurrent, in partial remission (HCC) # r/o GAD Acute stressors include: loss of his mother Other stressors include: Takes care of his wife, who uses a walker. Retirement (he describes satisfaction in this)    History: anxiety for many years. 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   There has been significant improvement in anxiety since the loss of his mother, which coincided with uptitration of BuSpar.  We can do current medication regimen.  Will continue Lexapro, and BuSpar to target anxiety.  He was advised again to discuss with his neurologist for possible discontinuation of Risperdal given his mood is improving/potential side effect of EPS.   3. Insomnia, unspecified type 4. Restless leg syndrome - on CPAP machine Significantly improved since starting trazodone.  Will continue current dose to target insomnia.  Will obtain lab to check ferritin level.    # benzodiazepine dependence - on clonazepam 0.5 mg BID  since 20's He is advised this medication to be tapered off in the future to avoid long-term risks.   Plan Continue lexapro 20 mg daily  Continue buspirone 30 mg twice a day  Continue Trazodone 25-50 mg at night as needed for insomnia [facility] Next appointment: 5/14 at 1:30 for 30 mins. IP - on risperidone 0.5 mg qhs, prescribed by neurology -on wegovy  Collaboration of Care: Collaboration of Care: Other reviewed notes in Epic  Patient/Guardian was advised Release of Information must be obtained prior to any record release in order to collaborate their care with an outside provider. Patient/Guardian was advised if they have not already done so to contact the registration department to sign all necessary forms in order for [place] to release information regarding their care.   Consent: Patient/Guardian gives verbal consent for treatment and assignment of benefits for services provided during this visit. Patient/Guardian expressed understanding and agreed to proceed.    Neysa Hotter, MD 12/28/2022, 2:03 PM

## 2022-12-28 ENCOUNTER — Encounter: Payer: Self-pay | Admitting: Psychiatry

## 2022-12-28 ENCOUNTER — Other Ambulatory Visit
Admission: RE | Admit: 2022-12-28 | Discharge: 2022-12-28 | Disposition: A | Discharge status: Home / Self Care | Payer: Medicare Other | Source: Ambulatory Visit | Attending: Psychiatry | Admitting: Psychiatry

## 2022-12-28 ENCOUNTER — Ambulatory Visit: Payer: Medicare Other | Admitting: Psychiatry

## 2022-12-28 VITALS — BP 128/75 | HR 97 | Temp 97.8°F | Ht 68.0 in | Wt 198.2 lb

## 2022-12-28 DIAGNOSIS — G4700 Insomnia, unspecified: Secondary | ICD-10-CM | POA: Diagnosis not present

## 2022-12-28 DIAGNOSIS — F410 Panic disorder [episodic paroxysmal anxiety] without agoraphobia: Secondary | ICD-10-CM | POA: Insufficient documentation

## 2022-12-28 DIAGNOSIS — G2581 Restless legs syndrome: Secondary | ICD-10-CM | POA: Insufficient documentation

## 2022-12-28 DIAGNOSIS — F3341 Major depressive disorder, recurrent, in partial remission: Secondary | ICD-10-CM

## 2022-12-28 LAB — FERRITIN: Ferritin: 93 ng/mL (ref 24–336)

## 2022-12-28 MED ORDER — TRAZODONE HCL 50 MG PO TABS: 25.0000 mg | ORAL_TABLET | Freq: Every evening | ORAL | 1 refills | Status: DC | PRN

## 2022-12-28 MED ORDER — BUSPIRONE HCL 30 MG PO TABS: 30.0000 mg | ORAL_TABLET | Freq: Two times a day (BID) | ORAL | 1 refills | Status: DC

## 2023-01-03 ENCOUNTER — Other Ambulatory Visit: Payer: Self-pay | Admitting: Psychiatry

## 2023-02-11 ENCOUNTER — Inpatient Hospital Stay
Admission: EM | Admit: 2023-02-11 | Discharge: 2023-02-13 | DRG: 919 | Disposition: A | Discharge status: Home / Self Care | Payer: Medicare Other | Attending: Internal Medicine | Admitting: Internal Medicine

## 2023-02-11 ENCOUNTER — Emergency Department: Payer: Medicare Other

## 2023-02-11 ENCOUNTER — Other Ambulatory Visit: Payer: Self-pay

## 2023-02-11 DIAGNOSIS — K529 Noninfective gastroenteritis and colitis, unspecified: Secondary | ICD-10-CM | POA: Diagnosis not present

## 2023-02-11 DIAGNOSIS — E7800 Pure hypercholesterolemia, unspecified: Secondary | ICD-10-CM | POA: Diagnosis present

## 2023-02-11 DIAGNOSIS — E1040 Type 1 diabetes mellitus with diabetic neuropathy, unspecified: Secondary | ICD-10-CM | POA: Diagnosis present

## 2023-02-11 DIAGNOSIS — T383X6A Underdosing of insulin and oral hypoglycemic [antidiabetic] drugs, initial encounter: Secondary | ICD-10-CM | POA: Diagnosis present

## 2023-02-11 DIAGNOSIS — Z1152 Encounter for screening for COVID-19: Secondary | ICD-10-CM | POA: Diagnosis not present

## 2023-02-11 DIAGNOSIS — Z96652 Presence of left artificial knee joint: Secondary | ICD-10-CM | POA: Diagnosis present

## 2023-02-11 DIAGNOSIS — T85694A Other mechanical complication of insulin pump, initial encounter: Principal | ICD-10-CM | POA: Diagnosis present

## 2023-02-11 DIAGNOSIS — Y92009 Unspecified place in unspecified non-institutional (private) residence as the place of occurrence of the external cause: Secondary | ICD-10-CM

## 2023-02-11 DIAGNOSIS — Z7951 Long term (current) use of inhaled steroids: Secondary | ICD-10-CM

## 2023-02-11 DIAGNOSIS — Z8673 Personal history of transient ischemic attack (TIA), and cerebral infarction without residual deficits: Secondary | ICD-10-CM

## 2023-02-11 DIAGNOSIS — Z820 Family history of epilepsy and other diseases of the nervous system: Secondary | ICD-10-CM

## 2023-02-11 DIAGNOSIS — Z9841 Cataract extraction status, right eye: Secondary | ICD-10-CM | POA: Diagnosis not present

## 2023-02-11 DIAGNOSIS — Z961 Presence of intraocular lens: Secondary | ICD-10-CM | POA: Diagnosis present

## 2023-02-11 DIAGNOSIS — Z87442 Personal history of urinary calculi: Secondary | ICD-10-CM

## 2023-02-11 DIAGNOSIS — I10 Essential (primary) hypertension: Secondary | ICD-10-CM | POA: Diagnosis present

## 2023-02-11 DIAGNOSIS — Z789 Other specified health status: Secondary | ICD-10-CM | POA: Insufficient documentation

## 2023-02-11 DIAGNOSIS — Z7901 Long term (current) use of anticoagulants: Secondary | ICD-10-CM | POA: Diagnosis not present

## 2023-02-11 DIAGNOSIS — Z794 Long term (current) use of insulin: Secondary | ICD-10-CM

## 2023-02-11 DIAGNOSIS — E1110 Type 2 diabetes mellitus with ketoacidosis without coma: Secondary | ICD-10-CM | POA: Diagnosis present

## 2023-02-11 DIAGNOSIS — G4730 Sleep apnea, unspecified: Secondary | ICD-10-CM | POA: Diagnosis present

## 2023-02-11 DIAGNOSIS — Z888 Allergy status to other drugs, medicaments and biological substances status: Secondary | ICD-10-CM | POA: Diagnosis not present

## 2023-02-11 DIAGNOSIS — H409 Unspecified glaucoma: Secondary | ICD-10-CM | POA: Diagnosis present

## 2023-02-11 DIAGNOSIS — K219 Gastro-esophageal reflux disease without esophagitis: Secondary | ICD-10-CM | POA: Diagnosis present

## 2023-02-11 DIAGNOSIS — Z79899 Other long term (current) drug therapy: Secondary | ICD-10-CM

## 2023-02-11 DIAGNOSIS — Z833 Family history of diabetes mellitus: Secondary | ICD-10-CM

## 2023-02-11 DIAGNOSIS — Z9842 Cataract extraction status, left eye: Secondary | ICD-10-CM | POA: Diagnosis not present

## 2023-02-11 DIAGNOSIS — Z8719 Personal history of other diseases of the digestive system: Secondary | ICD-10-CM

## 2023-02-11 DIAGNOSIS — F32A Depression, unspecified: Secondary | ICD-10-CM | POA: Diagnosis present

## 2023-02-11 DIAGNOSIS — R000 Tachycardia, unspecified: Secondary | ICD-10-CM | POA: Diagnosis present

## 2023-02-11 DIAGNOSIS — E1010 Type 1 diabetes mellitus with ketoacidosis without coma: Secondary | ICD-10-CM | POA: Diagnosis present

## 2023-02-11 DIAGNOSIS — J4489 Other specified chronic obstructive pulmonary disease: Secondary | ICD-10-CM | POA: Diagnosis present

## 2023-02-11 DIAGNOSIS — F419 Anxiety disorder, unspecified: Secondary | ICD-10-CM | POA: Diagnosis present

## 2023-02-11 DIAGNOSIS — Z7902 Long term (current) use of antithrombotics/antiplatelets: Secondary | ICD-10-CM

## 2023-02-11 DIAGNOSIS — R4781 Slurred speech: Secondary | ICD-10-CM | POA: Diagnosis present

## 2023-02-11 DIAGNOSIS — G459 Transient cerebral ischemic attack, unspecified: Secondary | ICD-10-CM | POA: Diagnosis not present

## 2023-02-11 LAB — URINALYSIS, ROUTINE W REFLEX MICROSCOPIC
Bacteria, UA: NONE SEEN
Bilirubin Urine: NEGATIVE
Glucose, UA: >=500 mg/dL — AB
Hgb urine dipstick: NEGATIVE
Ketones, ur: 80 mg/dL — AB
Leukocytes,Ua: NEGATIVE
Nitrite: NEGATIVE
Protein, ur: 30 mg/dL — AB
Specific Gravity, Urine: 1.015 (ref 1.005–1.030)
Squamous Epithelial / HPF: NONE SEEN /HPF (ref 0–5)
pH: 5 (ref 5.0–8.0)

## 2023-02-11 LAB — BLOOD GAS, VENOUS
Acid-base deficit: 8.5 mmol/L — ABNORMAL HIGH (ref 0.0–2.0)
Bicarbonate: 17.3 mmol/L — ABNORMAL LOW (ref 20.0–28.0)
O2 Saturation: 91.4 %
Patient temperature: 37
pCO2, Ven: 36 mmHg — ABNORMAL LOW (ref 44–60)
pH, Ven: 7.29 (ref 7.25–7.43)
pO2, Ven: 63 mmHg — ABNORMAL HIGH (ref 32–45)

## 2023-02-11 LAB — BASIC METABOLIC PANEL
Anion gap: 21 — ABNORMAL HIGH (ref 5–15)
Anion gap: 12 (ref 5–15)
Anion gap: 7 (ref 5–15)
BUN: 20 mg/dL (ref 8–23)
BUN: 20 mg/dL (ref 8–23)
BUN: 19 mg/dL (ref 8–23)
CO2: 15 mmol/L — ABNORMAL LOW (ref 22–32)
CO2: 20 mmol/L — ABNORMAL LOW (ref 22–32)
CO2: 23 mmol/L (ref 22–32)
Calcium: 9.9 mg/dL (ref 8.9–10.3)
Calcium: 8.7 mg/dL — ABNORMAL LOW (ref 8.9–10.3)
Calcium: 8.6 mg/dL — ABNORMAL LOW (ref 8.9–10.3)
Chloride: 97 mmol/L — ABNORMAL LOW (ref 98–111)
Chloride: 107 mmol/L (ref 98–111)
Chloride: 107 mmol/L (ref 98–111)
Creatinine, Ser: 1.16 mg/dL (ref 0.61–1.24)
Creatinine, Ser: 1.11 mg/dL (ref 0.61–1.24)
Creatinine, Ser: 0.9 mg/dL (ref 0.61–1.24)
GFR, Estimated: >60 mL/min (ref >60)
GFR, Estimated: >60 mL/min (ref >60)
GFR, Estimated: >60 mL/min (ref >60)
Glucose, Bld: 411 mg/dL — ABNORMAL HIGH (ref 70–99)
Glucose, Bld: 281 mg/dL — ABNORMAL HIGH (ref 70–99)
Glucose, Bld: 182 mg/dL — ABNORMAL HIGH (ref 70–99)
Potassium: 4.3 mmol/L (ref 3.5–5.1)
Potassium: 4.1 mmol/L (ref 3.5–5.1)
Potassium: 4 mmol/L (ref 3.5–5.1)
Sodium: 133 mmol/L — ABNORMAL LOW (ref 135–145)
Sodium: 139 mmol/L (ref 135–145)
Sodium: 137 mmol/L (ref 135–145)

## 2023-02-11 LAB — CBC
HCT: 48.1 % (ref 39.0–52.0)
Hemoglobin: 15.9 g/dL (ref 13.0–17.0)
MCH: 30.6 pg (ref 26.0–34.0)
MCHC: 33.1 g/dL (ref 30.0–36.0)
MCV: 92.5 fL (ref 80.0–100.0)
Platelets: 275 10*3/uL (ref 150–400)
RBC: 5.2 MIL/uL (ref 4.22–5.81)
RDW: 12.5 % (ref 11.5–15.5)
WBC: 15 10*3/uL — ABNORMAL HIGH (ref 4.0–10.5)
nRBC: 0 % (ref 0.0–0.2)

## 2023-02-11 LAB — RESP PANEL BY RT-PCR (RSV, FLU A&B, COVID)  RVPGX2
Influenza A by PCR: NEGATIVE
Influenza B by PCR: NEGATIVE
Resp Syncytial Virus by PCR: NEGATIVE
SARS Coronavirus 2 by RT PCR: NEGATIVE

## 2023-02-11 LAB — BETA-HYDROXYBUTYRIC ACID
Beta-Hydroxybutyric Acid: 3.73 mmol/L — ABNORMAL HIGH (ref 0.05–0.27)
Beta-Hydroxybutyric Acid: 4.1 mmol/L — ABNORMAL HIGH (ref 0.05–0.27)
Beta-Hydroxybutyric Acid: 0.43 mmol/L — ABNORMAL HIGH (ref 0.05–0.27)

## 2023-02-11 LAB — HEPATIC FUNCTION PANEL
ALT: 24 U/L (ref 0–44)
AST: 27 U/L (ref 15–41)
Albumin: 4.5 g/dL (ref 3.5–5.0)
Alkaline Phosphatase: 79 U/L (ref 38–126)
Bilirubin, Direct: 0.2 mg/dL (ref 0.0–0.2)
Indirect Bilirubin: 1.4 mg/dL — ABNORMAL HIGH (ref 0.3–0.9)
Total Bilirubin: 1.6 mg/dL — ABNORMAL HIGH (ref 0.3–1.2)
Total Protein: 7.2 g/dL (ref 6.5–8.1)

## 2023-02-11 LAB — GLUCOSE, CAPILLARY
Glucose-Capillary: 281 mg/dL — ABNORMAL HIGH (ref 70–99)
Glucose-Capillary: 251 mg/dL — ABNORMAL HIGH (ref 70–99)
Glucose-Capillary: 203 mg/dL — ABNORMAL HIGH (ref 70–99)
Glucose-Capillary: 164 mg/dL — ABNORMAL HIGH (ref 70–99)
Glucose-Capillary: 150 mg/dL — ABNORMAL HIGH (ref 70–99)
Glucose-Capillary: 154 mg/dL — ABNORMAL HIGH (ref 70–99)

## 2023-02-11 LAB — LACTIC ACID, PLASMA
Lactic Acid, Venous: 3.4 mmol/L (ref 0.5–1.9)
Lactic Acid, Venous: 2.5 mmol/L (ref 0.5–1.9)

## 2023-02-11 LAB — LIPASE, BLOOD: Lipase: 21 U/L (ref 11–51)

## 2023-02-11 LAB — MRSA NEXT GEN BY PCR, NASAL: MRSA by PCR Next Gen: NOT DETECTED

## 2023-02-11 LAB — CBG MONITORING, ED
Glucose-Capillary: 368 mg/dL — ABNORMAL HIGH (ref 70–99)
Glucose-Capillary: 377 mg/dL — ABNORMAL HIGH (ref 70–99)
Glucose-Capillary: 295 mg/dL — ABNORMAL HIGH (ref 70–99)

## 2023-02-11 LAB — TROPONIN I (HIGH SENSITIVITY)
Troponin I (High Sensitivity): 7 ng/L (ref <18)
Troponin I (High Sensitivity): 8 ng/L (ref <18)

## 2023-02-11 MED ORDER — IOHEXOL 300 MG/ML  SOLN
100.0000 mL | Freq: Once | INTRAMUSCULAR | Status: AC | PRN
  Administered 2023-02-11: 100 mL via INTRAVENOUS

## 2023-02-11 MED ORDER — ONDANSETRON HCL 4 MG/2ML IJ SOLN
4.0000 mg | Freq: Once | INTRAMUSCULAR | Status: AC
  Administered 2023-02-11: 4 mg via INTRAVENOUS
  Filled 2023-02-11: qty 2

## 2023-02-11 MED ORDER — PANTOPRAZOLE SODIUM 40 MG PO TBEC
40.0000 mg | DELAYED_RELEASE_TABLET | Freq: Every day | ORAL | Status: DC
  Administered 2023-02-12 – 2023-02-13 (×2): 40 mg via ORAL
  Filled 2023-02-11 (×2): qty 1

## 2023-02-11 MED ORDER — MORPHINE SULFATE (PF) 4 MG/ML IV SOLN
4.0000 mg | Freq: Once | INTRAVENOUS | Status: AC
  Administered 2023-02-11: 4 mg via INTRAVENOUS
  Filled 2023-02-11: qty 1

## 2023-02-11 MED ORDER — LACTATED RINGERS IV SOLN: INTRAVENOUS | Status: DC

## 2023-02-11 MED ORDER — OXYCODONE HCL 5 MG PO TABS
5.0000 mg | ORAL_TABLET | ORAL | Status: DC | PRN
  Administered 2023-02-11: 5 mg via ORAL
  Filled 2023-02-11: qty 1

## 2023-02-11 MED ORDER — SODIUM CHLORIDE 0.9 % IV BOLUS
1000.0000 mL | Freq: Once | INTRAVENOUS | Status: AC
  Administered 2023-02-11: 1000 mL via INTRAVENOUS

## 2023-02-11 MED ORDER — BUSPIRONE HCL 10 MG PO TABS
30.0000 mg | ORAL_TABLET | Freq: Two times a day (BID) | ORAL | Status: DC
  Administered 2023-02-11 – 2023-02-13 (×4): 30 mg via ORAL
  Filled 2023-02-11 (×4): qty 3

## 2023-02-11 MED ORDER — AMLODIPINE BESYLATE 5 MG PO TABS: 5.0000 mg | ORAL_TABLET | Freq: Every evening | ORAL | Status: DC

## 2023-02-11 MED ORDER — CLONAZEPAM 0.5 MG PO TABS
0.5000 mg | ORAL_TABLET | Freq: Two times a day (BID) | ORAL | Status: DC | PRN
  Administered 2023-02-11 – 2023-02-12 (×2): 0.5 mg via ORAL
  Filled 2023-02-11 (×2): qty 1

## 2023-02-11 MED ORDER — LATANOPROST 0.005 % OP SOLN
1.0000 [drp] | Freq: Every day | OPHTHALMIC | Status: DC
  Administered 2023-02-11 – 2023-02-12 (×2): 1 [drp] via OPHTHALMIC
  Filled 2023-02-11: qty 2.5

## 2023-02-11 MED ORDER — FLUTICASONE FUROATE-VILANTEROL 100-25 MCG/ACT IN AEPB
1.0000 | INHALATION_SPRAY | Freq: Every day | RESPIRATORY_TRACT | Status: DC
  Administered 2023-02-12: 1 via RESPIRATORY_TRACT
  Filled 2023-02-11: qty 28

## 2023-02-11 MED ORDER — LEVOFLOXACIN IN D5W 500 MG/100ML IV SOLN
500.0000 mg | Freq: Every day | INTRAVENOUS | Status: DC
  Administered 2023-02-12: 500 mg via INTRAVENOUS
  Filled 2023-02-11: qty 100

## 2023-02-11 MED ORDER — MORPHINE SULFATE (PF) 4 MG/ML IV SOLN: 4.0000 mg | INTRAVENOUS | Status: DC | PRN

## 2023-02-11 MED ORDER — POTASSIUM CHLORIDE 10 MEQ/100ML IV SOLN
10.0000 meq | INTRAVENOUS | Status: AC
  Administered 2023-02-11: 10 meq via INTRAVENOUS
  Filled 2023-02-11: qty 100

## 2023-02-11 MED ORDER — DEXTROSE 50 % IV SOLN: 0.0000 mL | INTRAVENOUS | Status: DC | PRN

## 2023-02-11 MED ORDER — LACTATED RINGERS IV BOLUS
20.0000 mL/kg | Freq: Once | INTRAVENOUS | Status: AC
  Administered 2023-02-11: 1778 mL via INTRAVENOUS

## 2023-02-11 MED ORDER — DEXTROSE IN LACTATED RINGERS 5 % IV SOLN: INTRAVENOUS | Status: DC

## 2023-02-11 MED ORDER — SODIUM CHLORIDE 0.9 % IV SOLN
2.0000 g | Freq: Once | INTRAVENOUS | Status: AC
  Administered 2023-02-11: 2 g via INTRAVENOUS
  Filled 2023-02-11: qty 20

## 2023-02-11 MED ORDER — POTASSIUM CHLORIDE 10 MEQ/100ML IV SOLN
10.0000 meq | INTRAVENOUS | Status: AC
  Administered 2023-02-11 (×2): 10 meq via INTRAVENOUS
  Filled 2023-02-11 (×2): qty 100

## 2023-02-11 MED ORDER — ALBUTEROL SULFATE (2.5 MG/3ML) 0.083% IN NEBU: 3.0000 mL | INHALATION_SOLUTION | Freq: Three times a day (TID) | RESPIRATORY_TRACT | Status: DC | PRN

## 2023-02-11 MED ORDER — METRONIDAZOLE 500 MG/100ML IV SOLN
500.0000 mg | Freq: Once | INTRAVENOUS | Status: AC
  Administered 2023-02-11: 500 mg via INTRAVENOUS
  Filled 2023-02-11: qty 100

## 2023-02-11 MED ORDER — INSULIN REGULAR(HUMAN) IN NACL 100-0.9 UT/100ML-% IV SOLN
INTRAVENOUS | Status: DC
  Administered 2023-02-11: 13 [IU]/h via INTRAVENOUS
  Administered 2023-02-11: 10.5 [IU]/h via INTRAVENOUS
  Filled 2023-02-11 (×2): qty 100

## 2023-02-11 MED ORDER — ESCITALOPRAM OXALATE 10 MG PO TABS
20.0000 mg | ORAL_TABLET | Freq: Every day | ORAL | Status: DC
  Administered 2023-02-12 – 2023-02-13 (×2): 20 mg via ORAL
  Filled 2023-02-11: qty 1
  Filled 2023-02-11: qty 2

## 2023-02-11 MED ORDER — ONDANSETRON HCL 4 MG/2ML IJ SOLN
4.0000 mg | Freq: Four times a day (QID) | INTRAMUSCULAR | Status: DC | PRN
  Administered 2023-02-11 – 2023-02-12 (×3): 4 mg via INTRAVENOUS
  Filled 2023-02-11 (×3): qty 2

## 2023-02-11 MED ORDER — CHLORHEXIDINE GLUCONATE CLOTH 2 % EX PADS: 6.0000 | MEDICATED_PAD | Freq: Every day | CUTANEOUS | Status: DC

## 2023-02-11 MED ORDER — POTASSIUM CHLORIDE 10 MEQ/100ML IV SOLN
10.0000 meq | Freq: Once | INTRAVENOUS | Status: AC
  Administered 2023-02-11: 10 meq via INTRAVENOUS
  Filled 2023-02-11: qty 100

## 2023-02-11 MED ORDER — ATORVASTATIN CALCIUM 20 MG PO TABS
20.0000 mg | ORAL_TABLET | Freq: Every evening | ORAL | Status: DC
  Administered 2023-02-12: 20 mg via ORAL
  Filled 2023-02-11: qty 1

## 2023-02-11 MED ORDER — ENOXAPARIN SODIUM 40 MG/0.4ML IJ SOSY
40.0000 mg | PREFILLED_SYRINGE | INTRAMUSCULAR | Status: DC
  Administered 2023-02-11 – 2023-02-12 (×2): 40 mg via SUBCUTANEOUS
  Filled 2023-02-11 (×2): qty 0.4

## 2023-02-11 MED ORDER — LACTATED RINGERS IV BOLUS
1000.0000 mL | Freq: Once | INTRAVENOUS | Status: AC
  Administered 2023-02-11: 1000 mL via INTRAVENOUS

## 2023-02-11 MED ORDER — CLOPIDOGREL BISULFATE 75 MG PO TABS
75.0000 mg | ORAL_TABLET | Freq: Every day | ORAL | Status: DC
  Administered 2023-02-12 – 2023-02-13 (×2): 75 mg via ORAL
  Filled 2023-02-11 (×2): qty 1

## 2023-02-11 NOTE — H&P (Signed)
History and Physical    Patient: Edgar Huynh VWU:[id number] DOB: 07-15-1956 DOA: 02/11/2023 DOS: the patient was seen and examined on 02/11/2023 PCP: Wilford Corner, PA-C  Patient coming from: Home  Chief Complaint:  Chief Complaint  Patient presents with   Emesis   Abdominal Pain   HPI: Edgar Huynh is a 67 y.o. male with medical history significant for type 1 diabetes, hypertension, hyperlipidemia, depression, stroke, who presents with nausea, vomiting, abdominal pain.  Patient reports that he has been having some abdominal pain for about the past 3 days.  He endorses nausea and vomiting but denies any diarrhea.  He reports that is been a long time since he has had DKA and generally has good control of his sugars.  He initially thought he was having indigestion and asked his PCP for a change in his PPI medicine  In the ED vital signs notable for tachycardia ranging 1 teens to 120s and mild tachypnea but otherwise unremarkable.  CBC showed elevated white count of 15 but otherwise normal.  CMP showed glucose of 411, chloride 97, CO2 15, anion gap 21, T. bili 1.6, remainder unremarkable.  Initial lactic acid was 3.4 with repeat pending, beta hydroxybutyric acid was 4.1 on initial check.  Chest x-ray was obtained and was unremarkable.  CT abdomen and pelvis was obtained as well which showed signs concerning for inflammatory colitis in the hepatic flexure and proximal transverse colon, no other acute findings.  He was given diagnosis of DKA and started on Endo tool, diabetes management nurse evaluated patient and on her evaluation found that patient's insulin pump was not properly connected and had not been delivering insulin likely for the last 3 days since it was newly installed.   Review of Systems: As mentioned in the history of present illness. All other systems reviewed and are negative. 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   TIA (transient ischemic attack)    Type 1 diabetes (HCC)    on insulin pump   Past Surgical History:  Procedure Laterality Date   CAROTID ARTERY ANGIOPLASTY Right 10/18/2004   CATARACT EXTRACTION, BILATERAL Bilateral    COLONOSCOPY     COLONOSCOPY WITH PROPOFOL N/A 08/25/2018   Procedure: COLONOSCOPY WITH PROPOFOL;  Surgeon: Scot Jun, MD;  Location: [facility] ENDOSCOPY;  Service: Endoscopy;  Laterality: N/A;   CYSTOSCOPY     ESOPHAGOGASTRODUODENOSCOPY     ESOPHAGOGASTRODUODENOSCOPY N/A 09/01/2022   Procedure: ESOPHAGOGASTRODUODENOSCOPY (EGD);  Surgeon: Toledo, Boykin Nearing, MD;  Location: [facility] ENDOSCOPY;  Service: Gastroenterology;  Laterality: N/A;   ESOPHAGOGASTRODUODENOSCOPY (EGD) WITH PROPOFOL N/A 08/25/2018   Procedure: ESOPHAGOGASTRODUODENOSCOPY (EGD) WITH PROPOFOL;  Surgeon: Scot Jun, MD;  Location: [facility] ENDOSCOPY;  Service: Endoscopy;  Laterality: N/A;   EYE SURGERY     PARTIAL KNEE ARTHROPLASTY Left 01/05/2022   Procedure: UNICOMPARTMENTAL KNEE;  Surgeon: Christena Flake, MD;  Location: [facility] ORS;  Service: Orthopedics;  Laterality: Left;   WISDOM TOOTH EXTRACTION     Social History:  reports that  he has never smoked. He has never used smokeless tobacco. He reports that he does not currently use alcohol. He reports that he does not use drugs.  Allergies  Allergen Reactions   Other Rash    Clonidine 0.3 mg patch (adhesive caused the irritation)    Family History  Problem Relation Age of Onset   Parkinson's disease Father    Diabetes Mellitus II Brother     Prior to Admission medications   Medication Sig Start Date  End Date Taking? Authorizing Provider  albuterol (VENTOLIN HFA) 108 (90 Base) MCG/ACT inhaler Inhale 2 puffs into the lungs every 8 (eight) hours as needed. 01/19/23  Yes [provider]  Alpha-Lipoic Acid 200 MG CAPS Take 200 mg by mouth daily.   Yes [provider]  amLODipine (NORVASC) 5 MG tablet Take 5 mg by mouth every evening. 03/17/21  Yes [provider]  atorvastatin (LIPITOR) 20 MG tablet Take 20 mg by mouth every evening.    Yes [provider]  budesonide-formoterol (SYMBICORT) 80-4.5 MCG/ACT inhaler Inhale 2 puffs into the lungs in the morning and at bedtime. 06/08/21  Yes [provider]  busPIRone (BUSPAR) 30 MG tablet Take 1 tablet (30 mg total) by mouth 2 (two) times daily. 01/08/23 03/09/23 Yes Neysa Hotter, MD  CALCIUM PO Take 1,200 mg by mouth daily.   Yes [provider]  CLINPRO 5000 1.1 % PSTE SMARTSIG:Sparingly To Teeth Daily 11/02/22  Yes [provider]  clonazePAM (KLONOPIN) 0.5 MG tablet Take 0.5 mg by mouth 2 (two) times daily as needed for anxiety.   Yes [provider]  clopidogrel (PLAVIX) 75 MG tablet Take 75 mg by mouth daily.   Yes [provider]  Coenzyme Q10 (COQ-10) 100 MG CAPS Take 100 mg by mouth daily.   Yes [provider]  escitalopram (LEXAPRO) 20 MG tablet Take 20 mg by mouth daily.   Yes [provider]  furosemide (LASIX) 20 MG tablet Take 20 mg by mouth daily. 04/07/21  Yes [provider]  HUMALOG 100 UNIT/ML injection Via Insulin Pump 07/26/21  Yes [provider]  hydrocortisone 2.5 % cream Apply 1 Application topically See admin instructions. Apply on Monday Wednesday and Friday to affected areas of face 10/25/22  Yes Deirdre Evener, MD  ketoconazole (NIZORAL) 2 % cream Apply 1 Application topically See admin instructions. Apply to face Ulanda Edison YTK 10/25/22  Yes Deirdre Evener, MD  latanoprost (XALATAN) 0.005 % ophthalmic solution  Place 1 drop into both eyes at bedtime. 11/24/21  Yes [provider]  Multiple Vitamin (MULTIVITAMIN WITH MINERALS) TABS tablet Take 1 tablet by mouth daily.   Yes [provider]  omeprazole (PRILOSEC) 40 MG capsule Take 40 mg by mouth in the morning and at bedtime. 07/16/21  Yes [provider]  pantoprazole (PROTONIX) 40 MG tablet Take 40 mg by mouth daily. 02/10/23 02/10/24 Yes [provider]  Potassium 99 MG TABS Take 2 tablets by mouth daily.   Yes [provider]  Semaglutide-Weight Management 1 MG/0.5ML SOAJ Inject 1 mg into the skin every 7 (seven) days. 01/20/23  Yes [provider]  traZODone (DESYREL) 50 MG tablet Take 0.5-1 tablets (25-50 mg total) by mouth at bedtime as needed for sleep. 01/08/23 03/09/23 Yes Hisada, Barbee Cough, MD  apixaban (ELIQUIS) 2.5 MG TABS tablet Take 1 tablet (2.5 mg total) by mouth 2 (two) times daily. Patient not taking: Reported on 02/11/2023 01/06/22   Poggi, Excell Seltzer,  MD  apixaban (ELIQUIS) 2.5 MG TABS tablet Take 1 tablet (2.5 mg total) by mouth 2 (two) times daily. Patient not taking: Reported on 02/11/2023 01/06/22   Poggi, Excell Seltzer, MD  busPIRone (BUSPAR) 10 MG tablet Take 10 mg by mouth 3 (three) times daily. Patient not taking: Reported on 02/11/2023    [provider]  glucose blood (CONTOUR NEXT TEST) test strip TEST BLOOD SUGAR 7 TIMES A DAY AS DIRECTED 05/15/21   [provider]  oxyCODONE (ROXICODONE) 5 MG immediate release tablet Take 1-2 tablets (5-10 mg total) by mouth every 4 (four) hours as needed for moderate pain or severe pain. Patient not taking: Reported on 02/11/2023 01/05/22   Poggi, Excell Seltzer, MD  risperiDONE (RISPERDAL) 0.5 MG tablet Take 0.5 mg by mouth at bedtime. Patient not taking: Reported on 02/11/2023    [provider]  risperiDONE (RISPERDAL) 1 MG tablet Take by mouth. Patient not taking: Reported on 02/11/2023    [provider]    Physical Exam: Vitals:    02/11/23 1330 02/11/23 1430 02/11/23 1438 02/11/23 1500  BP: 124/64 (!) 114/40  (!) 114/55  Pulse: (!) 115 (!) 121  (!) 120  Resp: (!) 26 (!) 27  17  Temp:      TempSrc:      SpO2: 94% 94%  94%  Weight:   88.9 kg    Physical Exam Constitutional:      General: He is not in acute distress.    Appearance: He is ill-appearing. He is not toxic-appearing or diaphoretic.  HENT:     Head: Normocephalic and atraumatic.     Mouth/Throat:     Mouth: Mucous membranes are dry.  Eyes:     General: No scleral icterus. Cardiovascular:     Rate and Rhythm: Regular rhythm. Tachycardia present.     Heart sounds: Normal heart sounds.  Pulmonary:     Effort: Pulmonary effort is normal.     Breath sounds: Normal breath sounds.  Abdominal:     General: Abdomen is flat. Bowel sounds are normal. There is no distension.     Palpations: Abdomen is soft. There is no mass.     Tenderness: There is generalized abdominal tenderness.  Skin:    General: Skin is warm and dry.  Neurological:     Mental Status: He is alert.  Psychiatric:        Mood and Affect: Mood normal.        Behavior: Behavior normal.     Data Reviewed: Results for orders placed or performed during the hospital encounter of 02/11/23 (from the past 24 hour(s))  Resp panel by RT-PCR (RSV, Flu A&B, Covid) Anterior Nasal Swab     Status: None   Collection Time: 02/11/23 10:42 AM   Specimen: Anterior Nasal Swab  Result Value Ref Range   SARS Coronavirus 2 by RT PCR NEGATIVE NEGATIVE   Influenza A by PCR NEGATIVE NEGATIVE   Influenza B by PCR NEGATIVE NEGATIVE   Resp Syncytial Virus by PCR NEGATIVE NEGATIVE  Basic metabolic panel     Status: Abnormal   Collection Time: 02/11/23 10:57 AM  Result Value Ref Range   Sodium 133 (L) 135 - 145 mmol/L   Potassium 4.3 3.5 - 5.1 mmol/L   Chloride 97 (L) 98 - 111 mmol/L   CO2 15 (L) 22 - 32 mmol/L   Glucose, Bld 411 (H) 70 - 99 mg/dL   BUN 20 8 - 23 mg/dL   Creatinine,  Ser 1.16 0.61 -  1.24 mg/dL   Calcium 9.9 8.9 - 13.2 mg/dL   GFR, Estimated >44 >01 mL/min   Anion gap 21 (H) 5 - 15  CBC     Status: Abnormal   Collection Time: 02/11/23 10:57 AM  Result Value Ref Range   WBC 15.0 (H) 4.0 - 10.5 K/uL   RBC 5.20 4.22 - 5.81 MIL/uL   Hemoglobin 15.9 13.0 - 17.0 g/dL   HCT 02.7 25.3 - 66.4 %   MCV 92.5 80.0 - 100.0 fL   MCH 30.6 26.0 - 34.0 pg   MCHC 33.1 30.0 - 36.0 g/dL   RDW 40.3 47.4 - 25.9 %   Platelets 275 150 - 400 K/uL   nRBC 0.0 0.0 - 0.2 %  Troponin I (High Sensitivity)     Status: None   Collection Time: 02/11/23 10:57 AM  Result Value Ref Range   Troponin I (High Sensitivity) 7 <18 ng/L  Urinalysis, Routine w reflex microscopic -Urine, Clean Catch     Status: Abnormal   Collection Time: 02/11/23 10:59 AM  Result Value Ref Range   Color, Urine YELLOW (A) YELLOW   APPearance CLEAR (A) CLEAR   Specific Gravity, Urine 1.015 1.005 - 1.030   pH 5.0 5.0 - 8.0   Glucose, UA >=500 (A) NEGATIVE mg/dL   Hgb urine dipstick NEGATIVE NEGATIVE   Bilirubin Urine NEGATIVE NEGATIVE   Ketones, ur 80 (A) NEGATIVE mg/dL   Protein, ur 30 (A) NEGATIVE mg/dL   Nitrite NEGATIVE NEGATIVE   Leukocytes,Ua NEGATIVE NEGATIVE   RBC / HPF 0-5 0 - 5 RBC/hpf   WBC, UA 0-5 0 - 5 WBC/hpf   Bacteria, UA NONE SEEN NONE SEEN   Squamous Epithelial / HPF NONE SEEN 0 - 5 /HPF   Mucus PRESENT    Hyaline Casts, UA PRESENT   Blood gas, venous     Status: Abnormal   Collection Time: 02/11/23 11:19 AM  Result Value Ref Range   pH, Ven 7.29 7.25 - 7.43   pCO2, Ven 36 (L) 44 - 60 mmHg   pO2, Ven 63 (H) 32 - 45 mmHg   Bicarbonate 17.3 (L) 20.0 - 28.0 mmol/L   Acid-base deficit 8.5 (H) 0.0 - 2.0 mmol/L   O2 Saturation 91.4 %   Patient temperature 37.0    Collection site VENOUS   Beta-hydroxybutyric acid     Status: Abnormal   Collection Time: 02/11/23 11:30 AM  Result Value Ref Range   Beta-Hydroxybutyric Acid 3.73 (H) 0.05 - 0.27 mmol/L  Hepatic function panel     Status: Abnormal    Collection Time: 02/11/23 11:30 AM  Result Value Ref Range   Total Protein 7.2 6.5 - 8.1 g/dL   Albumin 4.5 3.5 - 5.0 g/dL   AST 27 15 - 41 U/L   ALT 24 0 - 44 U/L   Alkaline Phosphatase 79 38 - 126 U/L   Total Bilirubin 1.6 (H) 0.3 - 1.2 mg/dL   Bilirubin, Direct 0.2 0.0 - 0.2 mg/dL   Indirect Bilirubin 1.4 (H) 0.3 - 0.9 mg/dL  Lipase, blood     Status: None   Collection Time: 02/11/23 11:30 AM  Result Value Ref Range   Lipase 21 11 - 51 U/L  Troponin I (High Sensitivity)     Status: None   Collection Time: 02/11/23  2:00 PM  Result Value Ref Range   Troponin I (High Sensitivity) 8 <18 ng/L  CBG monitoring, ED  Status: Abnormal   Collection Time: 02/11/23  2:55 PM  Result Value Ref Range   Glucose-Capillary 368 (H) 70 - 99 mg/dL  Beta-hydroxybutyric acid     Status: Abnormal   Collection Time: 02/11/23  3:15 PM  Result Value Ref Range   Beta-Hydroxybutyric Acid 4.10 (H) 0.05 - 0.27 mmol/L  Lactic acid, plasma     Status: Abnormal   Collection Time: 02/11/23  3:30 PM  Result Value Ref Range   Lactic Acid, Venous 3.4 (HH) 0.5 - 1.9 mmol/L  CBG monitoring, ED     Status: Abnormal   Collection Time: 02/11/23  3:50 PM  Result Value Ref Range   Glucose-Capillary 377 (H) 70 - 99 mg/dL  CBG monitoring, ED     Status: Abnormal   Collection Time: 02/11/23  4:59 PM  Result Value Ref Range   Glucose-Capillary 295 (H) 70 - 99 mg/dL   CT ABDOMEN PELVIS W CONTRAST CLINICAL DATA:  Abdominal pain, acute, nonlocalized  EXAM: CT ABDOMEN AND PELVIS WITH CONTRAST  TECHNIQUE: Multidetector CT imaging of the abdomen and pelvis was performed using the standard protocol following bolus administration of intravenous contrast.  RADIATION DOSE REDUCTION: This exam was performed according to the departmental dose-optimization program which includes automated exposure control, adjustment of the mA and/or kV according to patient size and/or use of iterative reconstruction  technique.  CONTRAST:  OMNIPAQUE IOHEXOL 300 MG/ML  SOLN  COMPARISON:  06/18/2019  FINDINGS: Lower chest: No acute abnormality.  Hepatobiliary: Diffusely decreased attenuation of the hepatic parenchyma. No focal liver lesion is identified. Unremarkable gallbladder. No hyperdense gallstone. No biliary dilatation.  Pancreas: Pancreatic atrophy. No inflammatory changes or ductal dilatation.  Spleen: Normal in size without focal abnormality.  Adrenals/Urinary Tract: Unremarkable adrenal glands. Multiple punctate 2-3 mm nonobstructing renal stones bilaterally. Kidneys enhance symmetrically. No focal renal lesion. No hydronephrosis. Ureters and urinary bladder within normal limits.  Stomach/Bowel: Tiny hiatal hernia. Stomach otherwise within normal limits. No dilated loops of bowel. Normal appendix in the right lower quadrant. Moderate volume of stool throughout the colon. Long segment mild circumferential colonic wall thickening involving the hepatic flexure and proximal transverse colon.  Vascular/Lymphatic: Scattered aortoiliac atherosclerotic calcifications without aneurysm. No abdominopelvic lymphadenopathy.  Reproductive: Prostate is unremarkable.  Other: No free fluid. No abdominopelvic fluid collection. No pneumoperitoneum. No abdominal wall hernia.  Musculoskeletal: No acute or significant osseous findings.  IMPRESSION: 1. Long segment mild circumferential colonic wall thickening involving the hepatic flexure and proximal transverse colon, suggestive of an infectious or inflammatory colitis. 2. Moderate volume of stool throughout the colon. 3. Hepatic steatosis. 4. Bilateral punctate nonobstructing nephrolithiasis. 5. Aortic atherosclerosis (ICD10-I70.0).  Electronically Signed   By: Duanne Guess D.O.   On: 02/11/2023 14:16 DG Chest Port 1 View CLINICAL DATA:  Epigastric pain  EXAM: PORTABLE CHEST 1 VIEW  COMPARISON:  X-ray 04/09/2021 and CT  angiogram  FINDINGS: Hyperinflation. No consolidation, pneumothorax or effusion. No edema. Normal cardiopericardial silhouette. Overlapping cardiac leads. Degenerative changes of the spine and shoulders.  IMPRESSION: No acute cardiopulmonary disease.  Electronically Signed   By: Karen Kays M.D.   On: 02/11/2023 11:37    Assessment and Plan: KYRUS SPRIGG is a 68 y.o. male with medical history significant for type 1 diabetes, hypertension, hyperlipidemia, depression, stroke, who presents with nausea, vomiting, abdominal pain, and is found to be in DKA along with signs of colitis on CT abdomen and pelvis.   * DKA (diabetic ketoacidosis) (HCC) Secondary to not receiving his  insulin because of mechanical issue with his pump. - DKA treatment per protocol - Appreciate diabetes management recommendations, transition to subcu insulin when possible - Consider discontinuation of Wegovy at time of discharge per diabetes Rex  Colitis Noted on CT abdomen and pelvis, patient has been reporting abdominal pain though not having much in the way of diarrhea per his report.  Has received ceftriaxone and Flagyl, will order stool PCR and switch to levofloxacin for time being with low threshold to stop.  Known medical problems Hypertension-continue amlodipine Anxiety-continue BuSpar, Klonopin, Lexapro History of stroke-continue Plavix Hyperlipidemia-continue atorvastatin Asthma-continue inhaled corticosteroid and LABA Glaucoma-continue latanoprost GERD-continue PPI      Advance Care Planning:   Code Status: Full Code , confirmed with patient  Consults: None  Family Communication: Wife updated at bedside  Severity of Illness: The appropriate patient status for this patient is INPATIENT. Inpatient status is judged to be reasonable and necessary in order to provide the required intensity of service to ensure the patient's safety. The patient's presenting symptoms, physical exam findings,  and initial radiographic and laboratory data in the context of their chronic comorbidities is felt to place them at high risk for further clinical deterioration. Furthermore, it is not anticipated that the patient will be medically stable for discharge from the hospital within 2 midnights of admission.   * I certify that at the point of admission it is my clinical judgment that the patient will require inpatient hospital care spanning beyond 2 midnights from the point of admission due to high intensity of service, high risk for further deterioration and high frequency of surveillance required.*  Author: Venora Maples, MD 02/11/2023 5:26 PM  For on call review [email or web address].

## 2023-02-11 NOTE — Consult Note (Signed)
CODE SEPSIS - PHARMACY COMMUNICATION  **Broad-spectrum antimicrobials should be administered within one hour of sepsis diagnosis**  Time Code Sepsis call or page was received: 1507  Antibiotics ordered: Ceftriaxone, Metronidazole  Time of first antibiotic administration: 1606  Additional action taken by pharmacy: Messaged RN  If necessary, name of provider/nurse contacted: Engineer, manufacturing, RN    Will M. Dareen Piano, PharmD PGY-1 Pharmacy Resident 02/11/2023 3:08 PM

## 2023-02-11 NOTE — Sepsis Progress Note (Signed)
Notified bedside nurse of need to draw repeat lactic acid. Secure chat sent to bedside RN in ICU after transfer. Informed the nurse that there is about 3 hours left on the bundle.

## 2023-02-11 NOTE — Assessment & Plan Note (Signed)
Hypertension-continue amlodipine Anxiety-continue BuSpar, Klonopin, Lexapro History of stroke-continue Plavix Hyperlipidemia-continue atorvastatin Asthma-continue inhaled corticosteroid and LABA Glaucoma-continue latanoprost GERD-continue PPI

## 2023-02-11 NOTE — ED Notes (Signed)
Lab notified to send phlebotomy as pt is a difficult stick.

## 2023-02-11 NOTE — ED Triage Notes (Signed)
Pt presents to Ed with c/o of ABD pain and N/V for the past 3 days. Pt denies urinary s/s. Pt also states burning in the stomach and epigastric area. Pt ambulatory with steady gait. Pt states not able to keep water down.

## 2023-02-11 NOTE — Sepsis Progress Note (Signed)
eLink is following this Code Sepsis. °

## 2023-02-11 NOTE — Assessment & Plan Note (Signed)
Secondary to not receiving his insulin because of mechanical issue with his pump. - DKA treatment per protocol - Appreciate diabetes management recommendations, transition to subcu insulin when possible - Consider discontinuation of Wegovy at time of discharge per diabetes Rex

## 2023-02-11 NOTE — Inpatient Diabetes Management (Addendum)
Inpatient Diabetes Program Recommendations  AACE/ADA: New Consensus Statement on Inpatient Glycemic Control   Target Ranges:  Prepandial:   less than 140 mg/dL      Peak postprandial:   less than 180 mg/dL (1-2 hours)      Critically ill patients:  140 - 180 mg/dL    Latest Reference Range & Units 02/11/23 10:57  CO2 22 - 32 mmol/L 15 (L)  Glucose 70 - 99 mg/dL 161 (H)  Anion gap 5 - 15  21 (H)    Review of Glycemic Control  Diabetes history: DM1 (does NOT make insulin; requires basal, correction, and carb coverage insulin) Outpatient Diabetes medications: T-Slim insulin pump with Humalog (total basal 24.7 units/day; insulin to carb ratio 1:3.5 grams, insulin sensitivity 1:40 mg/dl), Wegovy 1 mg Qweek Current orders for Inpatient glycemic control: None; in ED  Inpatient Diabetes Program Recommendations:    Insulin: Labs indicate DKA; anticipate patient will need IV insulin.  Outpatient DM: Would recommend to discontinue Wegovy and have patient discuss with Dr. Gershon Crane if he should be using a GLP-1 since he has Type 1 DM.  NOTE: Patient in ED with emesis, abdominal pain, and glucose of 411 mg/dl. Per chart review, patient has DM1 and sees Dr. Gershon Crane (Endocrinologist) for DM control. Per office note by Dr. Gershon Crane on 10/18/22 the following should be insulin pump settings:  He is currently Humalog via the T slim pump: Basal rates MN=0.95 3am=1.15 6am=1.1 10am=1.0 TDD basal: 24.7 units   Bolus settings I/C: 3.5 ISF: 40 Target Glucose: 120 Active insulin time: 4 hours   Spoke with patient and his wife at bedside. Patient just arrived back from CT and had to disconnect his insulin pump prior to CT. Patient has insulin pump lying bedside him in bed. Patient reports that when he went to disconnect his insulin pump he found that his infusion site cannula was not in the skin at all. Patient still had the infusion site partially attached. Asked patient to remove the infusion site  since it was already out and noted that the cannula was folded over completely so patient not receiving insulin from insulin pump. Reviewed insulin pump settings which are exactly as noted above.  Patient reports that his current infusion site was put on 2-3 days ago. Patient states that his sugars have been higher over the past 24 hours but he states they had been okay prior to that. Patient reports that he was trying to do extra bolus corrections to get his glucose down but it was not working. Patient normally uses the Control IQ (auto mode) on insulin pump. Patient states that he has on Dexcom G6 CGM and reports that it was saying high but he felt so bad that he did not pay close attention to his glucose. Discussed lab glucose of 411, CO2 15, an anion gap 21. Patient reports he has been in DKA in the past. Patient states that he has been nauseous for 2-3 days but woke up vomiting today. Patient notes that he also takes [place] (prescribed by PCP for weight loss) and that it makes him feel nauseous and he has been taking the 1mg  dose for 2 weeks. He states that he does not like the way he feels with the [facility] and has not seen much weight loss so he is considering stopping the medication.  Explained that if it is determined that he is in DKA that he will be started on IV fluids and IV insulin which would be continued  until acidosis has resolved.  Patient does not have extra insulin pump supplies with him here at the hospital. Patient's wife states she can bring extra pump supplies to the hospital so patient will have them in case insulin pump is resumed. Patient verbalized understanding of information and has no questions at this time.  Thanks, Orlando Penner, RN, MSN, CDCES Diabetes Coordinator Inpatient Diabetes Program [phone] (Team Pager from 8am to 5pm)

## 2023-02-11 NOTE — ED Provider Notes (Signed)
[facility] Provider Note    Event Date/Time   First MD Initiated Contact with Patient 02/11/23 1116     (approximate)   History   Emesis and Abdominal Pain   HPI  Edgar Huynh is a 67 y.o. male with history of type 1 diabetes, hyperlipidemia, hypertension presenting to the emergency department for evaluation of vomiting.  A few days ago patient began to develop vomiting with associated epigastric pain.  No dysuria or urinary frequency.  Today, had significant increase in frequency of vomiting.  He has an insulin pump in place with a continuous glucose monitor.  He reports his blood sugar is usually well-controlled, but has been reading "high" today.  He is unsure if he has a history of DKA in the past.  No headaches or recent head trauma.      Physical Exam   Triage Vital Signs: ED Triage Vitals  Enc Vitals Group     BP 02/11/23 1056 (!) 147/74     Pulse Rate 02/11/23 1056 (!) 138     Resp 02/11/23 1056 (!) 21     Temp 02/11/23 1056 98.6 F (37 C)     Temp Source 02/11/23 1056 Oral     SpO2 02/11/23 1056 98 %     Weight --      Height --      Head Circumference --      Peak Flow --      Pain Score 02/11/23 1053 5     Pain Loc --      Pain Edu? --      Excl. in GC? --     Most recent vital signs: Vitals:   02/11/23 1430 02/11/23 1500  BP: (!) 114/40 (!) 114/55  Pulse: (!) 121 (!) 120  Resp: (!) 27 17  Temp:    SpO2: 94% 94%     General: Awake, interactive, appears uncomfortable, frequently dry heaving CV:  Tachycardic, normal peripheral perfusion Resp:  Lungs clear, somewhat labored respirations  Abd:  Soft, nondistended, mild generalized tenderness most notable in the epigastric region without rebound or guarding Neuro:  Symmetric facial movement, fluid speech   ED Results / Procedures / Treatments   Labs (all labs ordered are listed, but only abnormal results are displayed) Labs Reviewed  BASIC METABOLIC PANEL - Abnormal;  Notable for the following components:      Result Value   Sodium 133 (*)    Chloride 97 (*)    CO2 15 (*)    Glucose, Bld 411 (*)    Anion gap 21 (*)    All other components within normal limits  CBC - Abnormal; Notable for the following components:   WBC 15.0 (*)    All other components within normal limits  BETA-HYDROXYBUTYRIC ACID - Abnormal; Notable for the following components:   Beta-Hydroxybutyric Acid 3.73 (*)    All other components within normal limits  BLOOD GAS, VENOUS - Abnormal; Notable for the following components:   pCO2, Ven 36 (*)    pO2, Ven 63 (*)    Bicarbonate 17.3 (*)    Acid-base deficit 8.5 (*)    All other components within normal limits  URINALYSIS, ROUTINE W REFLEX MICROSCOPIC - Abnormal; Notable for the following components:   Color, Urine YELLOW (*)    APPearance CLEAR (*)    Glucose, UA >=500 (*)    Ketones, ur 80 (*)    Protein, ur 30 (*)    All other  components within normal limits  HEPATIC FUNCTION PANEL - Abnormal; Notable for the following components:   Total Bilirubin 1.6 (*)    Indirect Bilirubin 1.4 (*)    All other components within normal limits  BETA-HYDROXYBUTYRIC ACID - Abnormal; Notable for the following components:   Beta-Hydroxybutyric Acid 4.10 (*)    All other components within normal limits  CBG MONITORING, ED - Abnormal; Notable for the following components:   Glucose-Capillary 368 (*)    All other components within normal limits  RESP PANEL BY RT-PCR (RSV, FLU A&B, COVID)  RVPGX2  CULTURE, BLOOD (ROUTINE X 2)  CULTURE, BLOOD (ROUTINE X 2)  LIPASE, BLOOD  LACTIC ACID, PLASMA  LACTIC ACID, PLASMA  BETA-HYDROXYBUTYRIC ACID  TROPONIN I (HIGH SENSITIVITY)  TROPONIN I (HIGH SENSITIVITY)     EKG EKG independently reviewed interpreted by myself (ER attending) demonstrates:  EKG demonstrates sinus tachycardia at a rate of 136, nonspecific ST changes, PR 130, QRS 73, QTc 433  RADIOLOGY Imaging independently reviewed and  interpreted by myself demonstrates:    PROCEDURES:  Critical Care performed: Yes, see critical care procedure note(s)  CRITICAL CARE Performed by: Trinna Post   Total critical care time: 37 minutes  Critical care time was exclusive of separately billable procedures and treating other patients.  Critical care was necessary to treat or prevent imminent or life-threatening deterioration.  Critical care was time spent personally by me on the following activities: development of treatment plan with patient and/or surrogate as well as nursing, discussions with consultants, evaluation of patient's response to treatment, examination of patient, obtaining history from patient or surrogate, ordering and performing treatments and interventions, ordering and review of laboratory studies, ordering and review of radiographic studies, pulse oximetry and re-evaluation of patient's condition.   Procedures   MEDICATIONS ORDERED IN ED: Medications  insulin regular, human (MYXREDLIN) 100 units/ 100 mL infusion (10.5 Units/hr Intravenous New Bag/Given 02/11/23 1508)  lactated ringers infusion (has no administration in time range)  dextrose 5 % in lactated ringers infusion (has no administration in time range)  dextrose 50 % solution 0-50 mL (has no administration in time range)  potassium chloride 10 mEq in 100 mL IVPB (0 mEq Intravenous Stopped 02/11/23 1544)  cefTRIAXone (ROCEPHIN) 2 g in sodium chloride 0.9 % 100 mL IVPB (has no administration in time range)  metroNIDAZOLE (FLAGYL) IVPB 500 mg (has no administration in time range)  sodium chloride 0.9 % bolus 1,000 mL (1,000 mLs Intravenous New Bag/Given 02/11/23 1130)  ondansetron (ZOFRAN) injection 4 mg (4 mg Intravenous Given 02/11/23 1130)  morphine (PF) 4 MG/ML injection 4 mg (4 mg Intravenous Given 02/11/23 1204)  iohexol (OMNIPAQUE) 300 MG/ML solution 100 mL (100 mLs Intravenous Contrast Given 02/11/23 1335)  sodium chloride 0.9 % bolus 1,000 mL  (1,000 mLs Intravenous New Bag/Given 02/11/23 1510)  lactated ringers bolus 1,778 mL (1,778 mLs Intravenous New Bag/Given 02/11/23 1510)     IMPRESSION / MDM / ASSESSMENT AND PLAN / ED COURSE  I reviewed the triage vital signs and the nursing notes.  Differential diagnosis includes, but is not limited to, DKA due to pump dysfunction, infection, acute intra-abdominal process, pneumonia, arrhythmia, electrolyte abnormality, pancreatitis  Patient's presentation is most consistent with acute presentation with potential threat to life or bodily function.  67 year old male presenting with recurrent vomiting, epigastric pain, significant tachycardia.  Glucose readings as high on his monitor with insulin pump in place.  His CT did demonstrate bowel wall thickening concerning for colitis.  However, I  was notified by the diabetes coordinator that when she went to check on the patient his infusion cannula on the site was out.  Possible multifactorial precipitant of DKA.  Potassium 4.3.  Patient's home insulin pump has been disconnected.  I did order him for an insulin drip here.  He does meet sepsis criteria with his tachycardia and tachypnea.  I have ordered him for Rocephin and Flagyl as well as additional fluids for both his colitis and DKA.  Will reach out to hospitalist team for admission.  Case discussed hospitalist team.  Patient admitted for further management.     FINAL CLINICAL IMPRESSION(S) / ED DIAGNOSES   Final diagnoses:  Diabetic ketoacidosis without coma associated with type 1 diabetes mellitus (HCC)  Colitis     Rx / DC Orders   ED Discharge Orders     None        Note:  This document was prepared using Dragon voice recognition software and may include unintentional dictation errors.   Trinna Post, MD 02/11/23 [phone]

## 2023-02-11 NOTE — Assessment & Plan Note (Signed)
Noted on CT abdomen and pelvis, patient has been reporting abdominal pain though not having much in the way of diarrhea per his report.  Has received ceftriaxone and Flagyl, will order stool PCR and switch to levofloxacin for time being with low threshold to stop.

## 2023-02-12 DIAGNOSIS — K529 Noninfective gastroenteritis and colitis, unspecified: Secondary | ICD-10-CM | POA: Diagnosis not present

## 2023-02-12 DIAGNOSIS — Z789 Other specified health status: Secondary | ICD-10-CM | POA: Diagnosis not present

## 2023-02-12 DIAGNOSIS — E1010 Type 1 diabetes mellitus with ketoacidosis without coma: Secondary | ICD-10-CM | POA: Diagnosis not present

## 2023-02-12 LAB — MAGNESIUM: Magnesium: 1.9 mg/dL (ref 1.7–2.4)

## 2023-02-12 LAB — BASIC METABOLIC PANEL
Anion gap: 8 (ref 5–15)
BUN: 17 mg/dL (ref 8–23)
CO2: 24 mmol/L (ref 22–32)
Calcium: 8.6 mg/dL — ABNORMAL LOW (ref 8.9–10.3)
Chloride: 107 mmol/L (ref 98–111)
Creatinine, Ser: 0.88 mg/dL (ref 0.61–1.24)
GFR, Estimated: >60 mL/min (ref >60)
Glucose, Bld: 125 mg/dL — ABNORMAL HIGH (ref 70–99)
Potassium: 3.9 mmol/L (ref 3.5–5.1)
Sodium: 139 mmol/L (ref 135–145)

## 2023-02-12 LAB — GLUCOSE, CAPILLARY
Glucose-Capillary: 133 mg/dL — ABNORMAL HIGH (ref 70–99)
Glucose-Capillary: 135 mg/dL — ABNORMAL HIGH (ref 70–99)
Glucose-Capillary: 155 mg/dL — ABNORMAL HIGH (ref 70–99)
Glucose-Capillary: 163 mg/dL — ABNORMAL HIGH (ref 70–99)
Glucose-Capillary: 170 mg/dL — ABNORMAL HIGH (ref 70–99)
Glucose-Capillary: 205 mg/dL — ABNORMAL HIGH (ref 70–99)
Glucose-Capillary: 307 mg/dL — ABNORMAL HIGH (ref 70–99)
Glucose-Capillary: 341 mg/dL — ABNORMAL HIGH (ref 70–99)

## 2023-02-12 LAB — BETA-HYDROXYBUTYRIC ACID: Beta-Hydroxybutyric Acid: 0.45 mmol/L — ABNORMAL HIGH (ref 0.05–0.27)

## 2023-02-12 LAB — PHOSPHORUS: Phosphorus: 2.5 mg/dL (ref 2.5–4.6)

## 2023-02-12 MED ORDER — ACETAMINOPHEN 325 MG PO TABS
650.0000 mg | ORAL_TABLET | Freq: Four times a day (QID) | ORAL | Status: DC | PRN
  Administered 2023-02-12: 650 mg via ORAL
  Filled 2023-02-12: qty 2

## 2023-02-12 MED ORDER — INSULIN ASPART 100 UNIT/ML IJ SOLN
0.0000 [IU] | Freq: Three times a day (TID) | INTRAMUSCULAR | Status: DC
  Administered 2023-02-12: 11 [IU] via SUBCUTANEOUS
  Administered 2023-02-12: 3 [IU] via SUBCUTANEOUS
  Administered 2023-02-12: 5 [IU] via SUBCUTANEOUS
  Administered 2023-02-12: 11 [IU] via SUBCUTANEOUS
  Administered 2023-02-13: 5 [IU] via SUBCUTANEOUS
  Filled 2023-02-12 (×5): qty 1

## 2023-02-12 MED ORDER — LACTATED RINGERS IV SOLN: INTRAVENOUS | Status: DC

## 2023-02-12 MED ORDER — FAMOTIDINE 20 MG PO TABS
20.0000 mg | ORAL_TABLET | Freq: Once | ORAL | Status: AC
  Administered 2023-02-12: 20 mg via ORAL
  Filled 2023-02-12: qty 1

## 2023-02-12 MED ORDER — TRAZODONE HCL 50 MG PO TABS
25.0000 mg | ORAL_TABLET | Freq: Every evening | ORAL | Status: DC | PRN
  Administered 2023-02-12: 50 mg via ORAL
  Filled 2023-02-12: qty 1

## 2023-02-12 MED ORDER — INSULIN GLARGINE-YFGN 100 UNIT/ML ~~LOC~~ SOLN
10.0000 [IU] | Freq: Every day | SUBCUTANEOUS | Status: DC
  Administered 2023-02-12 – 2023-02-13 (×2): 10 [IU] via SUBCUTANEOUS
  Filled 2023-02-12 (×2): qty 0.1

## 2023-02-12 NOTE — Progress Notes (Signed)
Report given to receiving nurse, Misty.  Patient transferred to room 222 in wheelchair with all belongings, family aware.

## 2023-02-12 NOTE — Progress Notes (Signed)
       CROSS COVER NOTE  NAME: Edgar Huynh MRN: [id number] DOB : 1956-05-27    Concern as stated by nurse / staff    Message received from RN I have a DM 1/HLD/HTN pt in rm 13 who arrived today with c/o N/V/abd pain and reporting his glucometer read "high". It was found that his insulin pump was malfunctioning and that he was in DKA with possible sepsis. His hourly blood sugars have stabilized with the last two in the 150s at a rate of 3.2 units with a D5%/LR gtt going at 125 ml/hr. Last Beta was 0.43. Endotool recommends transitioning off.    Pertinent findings on chart review: Gap closed. Cbg below 180  Assessment and  Interventions   Assessment:  Plan: Semglee 10 units x 1 dose ,moderate dose correction scale ACHS - wean iv insulin off per protocol Home trazodone resumed       Donnie Mesa NP [facility] Cross Cover 7pm-7am - check amion for availability Pager [phone]

## 2023-02-12 NOTE — Plan of Care (Signed)
Continuing with plan of care. 

## 2023-02-12 NOTE — Progress Notes (Signed)
Called to report on patient at 1350, receiving nurse to return call for report.

## 2023-02-12 NOTE — Progress Notes (Signed)
  Progress Note   Patient: Edgar Huynh WGN:[id number] DOB: 1956-02-05 DOA: 02/11/2023     1 DOS: the patient was seen and examined on 02/12/2023   Brief hospital course: DEVONTAY GERSTENBERGER is a 67 y.o. male with medical history significant for type 1 diabetes, hypertension, hyperlipidemia, depression, stroke, who presents with nausea, vomiting, abdominal pain.  Patient's insulin pump not working properly which caused his blood sugars go up.  Patient is admitted for DKA management, started on insulin drip and later transitioned to subcu insulin.  Assessment and Plan: * DKA (diabetic ketoacidosis) (HCC) Due to not receiving insulin because of mechanical issue with his pump. Patient's IV insulin transition to subcu insulin. Continue to monitor accucheks, supplement sliding scale insulin per protocol. Will stop Wegovy at time of discharge per diabetes Rex. Carb consistent diet.  Colitis Noted on CT abdomen and pelvis, patient has some abdominal pain upon presentation. But now no pain or diarrhea. Stop stool work up, antibiotics.   Continue levofloxacin for time being with low threshold to stop.  Hypertension-  BP lower side. Hold amlodipine.  Anxiety-continue BuSpar, Klonopin, Lexapro  History of stroke-continue Plavix, statin.  Hyperlipidemia-continue atorvastatin.  Asthma-continue inhaled corticosteroid and LABA.  Glaucoma-continue latanoprost.  GERD-continue PPI.  DVT prophylaxis. Supportive care. CODE STATUS- FULL CODE.     Subjective: Patient is seen and examined today morning.  He is lying comfortably.  Blood sugars improved.  No abdominal discomfort or nausea.  Physical Exam: Vitals:   02/12/23 0903 02/12/23 1000 02/12/23 1100 02/12/23 1200  BP:    (!) 92/56  Pulse: 97 99 96 94  Resp: (!) 23 15 (!) 21 (!) 22  Temp:    98.1 F (36.7 C)  TempSrc:    Oral  SpO2: 97% 93% 95% 96%  Weight:       General - Elderly Caucasian male, no apparent distress HEENT - PERRLA,  EOMI, atraumatic head, non tender sinuses. Lung - Clear, no rales, rhonchi, wheezes. Heart - S1, S2 heard, no murmurs, rubs, trace pedal edema Neuro - Alert, awake and oriented x 3, non focal exam. Skin - Warm and dry. Data Reviewed:  CBC, BMP, blood sugars  Family Communication: Patient seen and agrees with the current plan.  Disposition: Status is: Inpatient Remains inpatient appropriate because: DKA management, close no acute blood sugars  Planned Discharge Destination: Home    Time spent: 44 minutes  Author: Marcelino Duster, MD 02/12/2023 1:06 PM  For on call review [email or web address].

## 2023-02-13 DIAGNOSIS — E1010 Type 1 diabetes mellitus with ketoacidosis without coma: Secondary | ICD-10-CM | POA: Diagnosis not present

## 2023-02-13 DIAGNOSIS — I10 Essential (primary) hypertension: Secondary | ICD-10-CM | POA: Diagnosis not present

## 2023-02-13 DIAGNOSIS — K529 Noninfective gastroenteritis and colitis, unspecified: Secondary | ICD-10-CM | POA: Diagnosis not present

## 2023-02-13 DIAGNOSIS — G459 Transient cerebral ischemic attack, unspecified: Secondary | ICD-10-CM

## 2023-02-13 LAB — BASIC METABOLIC PANEL
Anion gap: 7 (ref 5–15)
BUN: 10 mg/dL (ref 8–23)
CO2: 27 mmol/L (ref 22–32)
Calcium: 8.1 mg/dL — ABNORMAL LOW (ref 8.9–10.3)
Chloride: 102 mmol/L (ref 98–111)
Creatinine, Ser: 0.8 mg/dL (ref 0.61–1.24)
GFR, Estimated: >60 mL/min (ref >60)
Glucose, Bld: 144 mg/dL — ABNORMAL HIGH (ref 70–99)
Potassium: 3.4 mmol/L — ABNORMAL LOW (ref 3.5–5.1)
Sodium: 136 mmol/L (ref 135–145)

## 2023-02-13 LAB — CULTURE, BLOOD (ROUTINE X 2)
Culture: NO GROWTH
Special Requests: ADEQUATE

## 2023-02-13 LAB — GLUCOSE, CAPILLARY
Glucose-Capillary: 245 mg/dL — ABNORMAL HIGH (ref 70–99)
Glucose-Capillary: 292 mg/dL — ABNORMAL HIGH (ref 70–99)

## 2023-02-13 NOTE — Discharge Summary (Signed)
Physician Discharge Summary   Patient: Edgar Huynh MRN: [id number] DOB: Jun 12, 1956  Admit date:     02/11/2023  Discharge date: 02/13/23  Discharge Physician: Marcelino Duster   PCP: Wilford Corner, PA-C   Recommendations at discharge:    PCP follow up in 1 week. Neurology follow up as scheduled. Insulin pump management as per endocrinologist.  Discharge Diagnoses: Principal Problem:   DKA (diabetic ketoacidosis) (HCC) Active Problems:   Known medical problems   Colitis  Resolved Problems:   * No resolved hospital problems. [facility] Course: As per HPI- Edgar Huynh is a 67 y.o. male with medical history significant for type 1 diabetes, hypertension, hyperlipidemia, depression, stroke, who presents with nausea, vomiting, abdominal pain.   Patient reports that he has been having some abdominal pain for about the past 3 days.  He endorses nausea and vomiting but denies any diarrhea.  He reports that is been a long time since he has had DKA and generally has good control of his sugars.  He initially thought he was having indigestion and asked his PCP for a change in his PPI medicine   Patient is admitted for management of DKA and started on Endo tool, diabetes management nurse evaluated patient and on her evaluation found that patient's insulin pump was not properly connected and had not been delivering insulin likely for the last 3 days since it was newly installed.  Patient's patient is improved on IV insulin gradually transition to subcu insulin regimen.  Had CT abdomen pelvis which did show colitis.  Does not have abdominal pain or diarrhea.  Patient is eating well.  Antibiotics stopped.  Patient has slurred speech, gait instability since last 3 months and has been following neurologist.  I advised him to follow-up with endocrinology regarding his insulin pump management.  Patient request to stop [facility] therapy.  I advised him to follow-up with PCP, neurology and  endocrinology upon discharge as instructed.  Patient and his wife understand and agree with the discharge plan.        Consultants: none Procedures performed: none  Disposition: Home Diet recommendation:  Discharge Diet Orders (From admission, onward)     Start     Ordered   02/13/23 0000  Diet - low sodium heart healthy        02/13/23 1145           Cardiac and Carb modified diet DISCHARGE MEDICATION: Allergies as of 02/13/2023       Reactions   Other Rash   Clonidine 0.3 mg patch (adhesive caused the irritation)        Medication List     STOP taking these medications    apixaban 2.5 MG Tabs tablet Commonly known as: Eliquis   omeprazole 40 MG capsule Commonly known as: PRILOSEC   oxyCODONE 5 MG immediate release tablet Commonly known as: Roxicodone   risperiDONE 0.5 MG tablet Commonly known as: RISPERDAL   risperiDONE 1 MG tablet Commonly known as: RISPERDAL       TAKE these medications    albuterol 108 (90 Base) MCG/ACT inhaler Commonly known as: VENTOLIN HFA Inhale 2 puffs into the lungs every 8 (eight) hours as needed.   Alpha-Lipoic Acid 200 MG Caps Take 200 mg by mouth daily.   amLODipine 5 MG tablet Commonly known as: NORVASC Take 5 mg by mouth every evening.   atorvastatin 20 MG tablet Commonly known as: LIPITOR Take 20 mg by mouth every evening.   budesonide-formoterol  80-4.5 MCG/ACT inhaler Commonly known as: SYMBICORT Inhale 2 puffs into the lungs in the morning and at bedtime.   busPIRone 30 MG tablet Commonly known as: BUSPAR Take 1 tablet (30 mg total) by mouth 2 (two) times daily. What changed: Another medication with the same name was removed. Continue taking this medication, and follow the directions you see here.   CALCIUM PO Take 1,200 mg by mouth daily.   Clinpro 5000 1.1 % Pste Generic drug: Sodium Fluoride SMARTSIG:Sparingly To Teeth Daily   clonazePAM 0.5 MG tablet Commonly known as: KLONOPIN Take 0.5  mg by mouth 2 (two) times daily as needed for anxiety.   clopidogrel 75 MG tablet Commonly known as: PLAVIX Take 75 mg by mouth daily.   Contour Next Test test strip Generic drug: glucose blood TEST BLOOD SUGAR 7 TIMES A DAY AS DIRECTED   CoQ-10 100 MG Caps Take 100 mg by mouth daily.   escitalopram 20 MG tablet Commonly known as: LEXAPRO Take 20 mg by mouth daily.   furosemide 20 MG tablet Commonly known as: LASIX Take 20 mg by mouth daily.   HumaLOG 100 UNIT/ML injection Generic drug: insulin lispro Via Insulin Pump   hydrocortisone 2.5 % cream Apply 1 Application topically See admin instructions. Apply on Monday Wednesday and Friday to affected areas of face   ketoconazole 2 % cream Commonly known as: NIZORAL Apply 1 Application topically See admin instructions. Apply to face Tues Thurs Sat   latanoprost 0.005 % ophthalmic solution Commonly known as: XALATAN Place 1 drop into both eyes at bedtime.   multivitamin with minerals Tabs tablet Take 1 tablet by mouth daily.   pantoprazole 40 MG tablet Commonly known as: PROTONIX Take 40 mg by mouth daily.   Potassium 99 MG Tabs Take 2 tablets by mouth daily.   Semaglutide-Weight Management 1 MG/0.5ML Soaj Inject 1 mg into the skin every 7 (seven) days.   traZODone 50 MG tablet Commonly known as: DESYREL Take 0.5-1 tablets (25-50 mg total) by mouth at bedtime as needed for sleep.        Discharge Exam: Filed Weights   02/11/23 1438 02/11/23 1752  Weight: 88.9 kg 87.2 kg   General - Elderly Caucasian male, no apparent distress HEENT - PERRLA, EOMI, atraumatic head, non tender sinuses. Lung - Clear, no rales, rhonchi, wheezes. Heart - S1, S2 heard, no murmurs, rubs, trace pedal edema Neuro - Alert, awake and oriented x 3, slurred abnormality noted Skin - Warm and dry.  Condition at discharge: good  The results of significant diagnostics from this hospitalization (including imaging, microbiology,  ancillary and laboratory) are listed below for reference.   Imaging Studies: CT ABDOMEN PELVIS W CONTRAST  Result Date: 02/11/2023 CLINICAL DATA:  Abdominal pain, acute, nonlocalized EXAM: CT ABDOMEN AND PELVIS WITH CONTRAST TECHNIQUE: Multidetector CT imaging of the abdomen and pelvis was performed using the standard protocol following bolus administration of intravenous contrast. RADIATION DOSE REDUCTION: This exam was performed according to the departmental dose-optimization program which includes automated exposure control, adjustment of the mA and/or kV according to patient size and/or use of iterative reconstruction technique. CONTRAST:  OMNIPAQUE IOHEXOL 300 MG/ML  SOLN COMPARISON:  06/18/2019 FINDINGS: Lower chest: No acute abnormality. Hepatobiliary: Diffusely decreased attenuation of the hepatic parenchyma. No focal liver lesion is identified. Unremarkable gallbladder. No hyperdense gallstone. No biliary dilatation. Pancreas: Pancreatic atrophy. No inflammatory changes or ductal dilatation. Spleen: Normal in size without focal abnormality. Adrenals/Urinary Tract: Unremarkable adrenal glands. Multiple punctate 2-3  mm nonobstructing renal stones bilaterally. Kidneys enhance symmetrically. No focal renal lesion. No hydronephrosis. Ureters and urinary bladder within normal limits. Stomach/Bowel: Tiny hiatal hernia. Stomach otherwise within normal limits. No dilated loops of bowel. Normal appendix in the right lower quadrant. Moderate volume of stool throughout the colon. Long segment mild circumferential colonic wall thickening involving the hepatic flexure and proximal transverse colon. Vascular/Lymphatic: Scattered aortoiliac atherosclerotic calcifications without aneurysm. No abdominopelvic lymphadenopathy. Reproductive: Prostate is unremarkable. Other: No free fluid. No abdominopelvic fluid collection. No pneumoperitoneum. No abdominal wall hernia. Musculoskeletal: No acute or significant  osseous findings. IMPRESSION: 1. Long segment mild circumferential colonic wall thickening involving the hepatic flexure and proximal transverse colon, suggestive of an infectious or inflammatory colitis. 2. Moderate volume of stool throughout the colon. 3. Hepatic steatosis. 4. Bilateral punctate nonobstructing nephrolithiasis. 5. Aortic atherosclerosis (ICD10-I70.0). Electronically Signed   By: Duanne Guess D.O.   On: 02/11/2023 14:16   DG Chest Port 1 View  Result Date: 02/11/2023 CLINICAL DATA:  Epigastric pain EXAM: PORTABLE CHEST 1 VIEW COMPARISON:  X-ray 04/09/2021 and CT angiogram FINDINGS: Hyperinflation. No consolidation, pneumothorax or effusion. No edema. Normal cardiopericardial silhouette. Overlapping cardiac leads. Degenerative changes of the spine and shoulders. IMPRESSION: No acute cardiopulmonary disease. Electronically Signed   By: Karen Kays M.D.   On: 02/11/2023 11:37    Microbiology: Results for orders placed or performed during the hospital encounter of 02/11/23  Resp panel by RT-PCR (RSV, Flu A&B, Covid) Anterior Nasal Swab     Status: None   Collection Time: 02/11/23 10:42 AM   Specimen: Anterior Nasal Swab  Result Value Ref Range Status   SARS Coronavirus 2 by RT PCR NEGATIVE NEGATIVE Final    Comment: (NOTE) SARS-CoV-2 target nucleic acids are NOT DETECTED.  The SARS-CoV-2 RNA is generally detectable in upper respiratory specimens during the acute phase of infection. The lowest concentration of SARS-CoV-2 viral copies this assay can detect is 138 copies/mL. A negative result does not preclude SARS-Cov-2 infection and should not be used as the sole basis for treatment or other patient management decisions. A negative result may occur with  improper specimen collection/handling, submission of specimen other than nasopharyngeal swab, presence of viral mutation(s) within the areas targeted by this assay, and inadequate number of viral copies(<138 copies/mL). A  negative result must be combined with clinical observations, patient history, and epidemiological information. The expected result is Negative.  Fact Sheet for Patients:  BloggerCourse.com  Fact Sheet for Healthcare Providers:  SeriousBroker.it  This test is no t yet approved or cleared by the [place] FDA and  has been authorized for detection and/or diagnosis of SARS-CoV-2 by FDA under an Emergency Use Authorization (EUA). This EUA will remain  in effect (meaning this test can be used) for the duration of the COVID-19 declaration under Section 564(b)(1) of the Act, 21 U.S.C.section 360bbb-3(b)(1), unless the authorization is terminated  or revoked sooner.       Influenza A by PCR NEGATIVE NEGATIVE Final   Influenza B by PCR NEGATIVE NEGATIVE Final    Comment: (NOTE) The Xpert Xpress SARS-CoV-2/FLU/RSV plus assay is intended as an aid in the diagnosis of influenza from Nasopharyngeal swab specimens and should not be used as a sole basis for treatment. Nasal washings and aspirates are unacceptable for Xpert Xpress SARS-CoV-2/FLU/RSV testing.  Fact Sheet for Patients: BloggerCourse.com  Fact Sheet for Healthcare Providers: SeriousBroker.it  This test is not yet approved or cleared by the [place] and has been authorized  for detection and/or diagnosis of SARS-CoV-2 by FDA under an Emergency Use Authorization (EUA). This EUA will remain in effect (meaning this test can be used) for the duration of the COVID-19 declaration under Section 564(b)(1) of the Act, 21 U.S.C. section 360bbb-3(b)(1), unless the authorization is terminated or revoked.     Resp Syncytial Virus by PCR NEGATIVE NEGATIVE Final    Comment: (NOTE) Fact Sheet for Patients: BloggerCourse.com  Fact Sheet for Healthcare  Providers: SeriousBroker.it  This test is not yet approved or cleared by the [place] FDA and has been authorized for detection and/or diagnosis of SARS-CoV-2 by FDA under an Emergency Use Authorization (EUA). This EUA will remain in effect (meaning this test can be used) for the duration of the COVID-19 declaration under Section 564(b)(1) of the Act, 21 U.S.C. section 360bbb-3(b)(1), unless the authorization is terminated or revoked.  Performed at [facility], [address] [place], [place] [address]   Blood Culture (routine x 2)     Status: None (Preliminary result)   Collection Time: 02/11/23  3:30 PM   Specimen: BLOOD  Result Value Ref Range Status   Specimen Description BLOOD LEFT ANTECUBITAL  Final   Special Requests   Final    BOTTLES DRAWN AEROBIC AND ANAEROBIC Blood Culture adequate volume   Culture   Final    NO GROWTH 2 DAYS Performed at [facility], [address], [place], [place] [address]    Report Status PENDING  Incomplete  Blood Culture (routine x 2)     Status: None (Preliminary result)   Collection Time: 02/11/23  3:38 PM   Specimen: BLOOD  Result Value Ref Range Status   Specimen Description BLOOD BLOOD RIGHT HAND  Final   Special Requests   Final    BOTTLES DRAWN AEROBIC AND ANAEROBIC Blood Culture adequate volume   Culture   Final    NO GROWTH 2 DAYS Performed at [facility], [address], [place], [place] [address]    Report Status PENDING  Incomplete  MRSA Next Gen by PCR, Nasal     Status: None   Collection Time: 02/11/23  5:54 PM   Specimen: Nasal Mucosa; Nasal Swab  Result Value Ref Range Status   MRSA by PCR Next Gen NOT DETECTED NOT DETECTED Final    Comment: (NOTE) The GeneXpert MRSA Assay (FDA approved for NASAL specimens only), is one component of a comprehensive MRSA colonization surveillance program. It is not intended to diagnose MRSA infection nor to guide or monitor  treatment for MRSA infections. Test performance is not FDA approved in patients less than 75 years old. Performed at [facility], [address] [place], [place] [address]     Labs: CBC: Recent Labs  Lab 02/11/23 1057  WBC 15.0*  HGB 15.9  HCT 48.1  MCV 92.5  PLT 275   Basic Metabolic Panel: Recent Labs  Lab 02/11/23 1057 02/11/23 1801 02/11/23 2127 02/12/23 0413 02/13/23 0431  NA 133* 139 137 139 136  K 4.3 4.1 4.0 3.9 3.4*  CL 97* 107 107 107 102  CO2 15* 20* 23 24 27   GLUCOSE 411* 281* 182* 125* 144*  BUN 20 20 19 17 10   CREATININE 1.16 1.11 0.90 0.88 0.80  CALCIUM 9.9 8.7* 8.6* 8.6* 8.1*  MG  --   --   --  1.9  --   PHOS  --   --   --  2.5  --    Liver Function Tests:  Recent Labs  Lab 02/11/23 1130  AST 27  ALT 24  ALKPHOS 79  BILITOT 1.6*  PROT 7.2  ALBUMIN 4.5   CBG: Recent Labs  Lab 02/12/23 0741 02/12/23 1131 02/12/23 1721 02/12/23 2101 02/13/23 0811  GLUCAP 170* 205* 341* 307* 245*    Discharge time spent: greater than 30 minutes.  Signed: Marcelino Duster, MD [facility] 02/13/2023

## 2023-02-14 LAB — CULTURE, BLOOD (ROUTINE X 2)

## 2023-02-15 LAB — CULTURE, BLOOD (ROUTINE X 2): Culture: NO GROWTH

## 2023-02-16 LAB — CULTURE, BLOOD (ROUTINE X 2): Special Requests: ADEQUATE

## 2023-02-18 NOTE — Progress Notes (Unsigned)
Virtual Visit via Video Note  I connected with Edgar Huynh on 02/23/23 at 11:00 AM EDT by a video enabled telemedicine application and verified that I am speaking with the correct person using two identifiers.  Location: Patient: home Provider: office Persons participated in the visit- patient, provider    I discussed the limitations of evaluation and management by telemedicine and the availability of in person appointments. The patient expressed understanding and agreed to proceed.   I discussed the assessment and treatment plan with the patient. The patient was provided an opportunity to ask questions and all were answered. The patient agreed with the plan and demonstrated an understanding of the instructions.   The patient was advised to call back or seek an in-person evaluation if the symptoms worsen or if the condition fails to improve as anticipated.  I provided 15 minutes of non-face-to-face time during this encounter.   Neysa Hotter, MD    [facility] MD/PA/NP OP Progress Note  02/23/2023 11:29 AM Edgar Huynh  MRN:  [id number]  Chief Complaint: No chief complaint on file.  HPI:  - According to the chart review, the following events have occurred since the last visit: The patient was seen at ED for DKA.  - he was seen by [facility] neurology for abnormal involuntary movements. Per chart review, "This is a complicated picture overall. I suspect he has longstanding tic disorder with more recent tardive dyskinesia impacting his mouth and LE in the setting of neuroleptics (Abilify, risperidone). I cannot exclude underlying tics also causing some of his movements as well as a possible component of restless legs syndrome, though his symptoms are not classic for that. Overall, his orofacial movements most point to tardive dyskinesia, so my recommendations focused on that working diagnosis. " He was started on ingrezza.   This is a follow-up appointment for depression and panic disorder.  He  states that he has been doing very well.  He was started on Ingrezza 2 days ago, and has already noticed a difference.  He feels very optimistic about this.  He had a rough time on recent admission.  He was discontinued Wegovy  His blood sugar has been in good control since returning to home.  He enjoys follow-up with his wife.  He sleeps well with trazodone.  He denies feeling depressed.  He feels anxiety has been better, and denies any panic attacks. Risperidone, prescribed by his neurologist was discontinued about two weeks ago. He feels comfortable to stay on the current medication regimen.   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  He states that although they were not rich, his parents provided enough to him.    Substance use   Tobacco Alcohol Other substances/  Current denies denies denies  Past denies 12 pack on weekend, not since 2018 denies  Past Treatment           Visit Diagnosis:    ICD-10-CM   1. Panic disorder  F41.0     2. MDD (major depressive disorder), recurrent, in partial  remission (HCC)  F33.41     3. Insomnia, unspecified type  G47.00       Past Psychiatric History: Please see initial evaluation for full details. I have reviewed the history. No updates at this time.     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   TIA (transient ischemic attack)    Type 1 diabetes (HCC)    on insulin pump    Past Surgical History:  Procedure Laterality Date   CAROTID ARTERY ANGIOPLASTY Right 10/18/2004   CATARACT EXTRACTION, BILATERAL Bilateral    COLONOSCOPY     COLONOSCOPY WITH PROPOFOL N/A 08/25/2018   Procedure: COLONOSCOPY WITH PROPOFOL;  Surgeon: Scot Jun, MD;  Location: [facility] ENDOSCOPY;  Service: Endoscopy;  Laterality: N/A;   CYSTOSCOPY     ESOPHAGOGASTRODUODENOSCOPY     ESOPHAGOGASTRODUODENOSCOPY N/A 09/01/2022   Procedure: ESOPHAGOGASTRODUODENOSCOPY (EGD);  Surgeon: Toledo, Boykin Nearing, MD;  Location: [facility] ENDOSCOPY;  Service: Gastroenterology;  Laterality: N/A;   ESOPHAGOGASTRODUODENOSCOPY (EGD) WITH PROPOFOL N/A 08/25/2018   Procedure: ESOPHAGOGASTRODUODENOSCOPY (EGD) WITH PROPOFOL;  Surgeon: Scot Jun, MD;  Location: [facility] ENDOSCOPY;  Service: Endoscopy;  Laterality: N/A;   EYE SURGERY     PARTIAL KNEE ARTHROPLASTY Left 01/05/2022   Procedure: UNICOMPARTMENTAL KNEE;  Surgeon: Christena Flake, MD;  Location: [facility] ORS;  Service: Orthopedics;  Laterality: Left;   WISDOM TOOTH EXTRACTION      Family Psychiatric History: Please see initial evaluation for full details. I have reviewed the history. No updates at this time.     Family History:  Family History  Problem Relation Age of Onset   Parkinson's disease Father    Diabetes Mellitus II Brother     Social History:  Social History   Socioeconomic History   Marital status: Married    Spouse name: [name]   Number of children: 2   Years of education: Not on file   Highest education level: Some college, no degree  Occupational History   Not on file  Tobacco Use   Smoking status: Never   Smokeless tobacco: Never  Vaping Use    Vaping Use: Never used  Substance and Sexual Activity   Alcohol use: Not Currently    Comment: occasional   Drug use: Never   Sexual activity: Not on file    Comment: not asked if sexually active  Other Topics Concern   Not on file  Social History Narrative   Not on file   Social Determinants of Health   Financial Resource Strain: Not on file  Food Insecurity: No Food Insecurity (02/12/2023)   Hunger Vital Sign    Worried About Running Out of Food in the Last Year: Never true    Ran Out of Food in the Last Year: Never true  Transportation Needs: No Transportation Needs (02/12/2023)   PRAPARE - Administrator, Civil Service (Medical): No    Lack of Transportation (Non-Medical): No  Physical Activity: Not on file  Stress: Not on file  Social Connections: Not on file    Allergies:  Allergies  Allergen Reactions   Other Rash    Clonidine 0.3 mg patch (adhesive caused the irritation)    Metabolic Disorder Labs: Lab Results  Component Value Date   HGBA1C 7.2 (H) 04/09/2021   MPG 159.94 04/09/2021   MPG 151 03/02/2017   Lab Results  Component Value Date   PROLACTIN 25.6 (H) 03/02/2017   Lab Results  Component Value Date   CHOL 151 04/10/2021   TRIG 134 04/10/2021   HDL 46 04/10/2021   CHOLHDL 3.3 04/10/2021   VLDL 27 04/10/2021   LDLCALC 78 04/10/2021   LDLCALC 119 (H) 03/02/2017   Lab Results  Component Value Date   TSH 1.582 11/09/2022   TSH 0.937 04/10/2021    Therapeutic Level Labs: No results found for: "LITHIUM" No results found for: "VALPROATE" No results found for: "CBMZ"  Current Medications: Current Outpatient Medications  Medication Sig Dispense Refill   valbenazine (INGREZZA) 80 MG capsule Take 80 mg by mouth daily.     albuterol (VENTOLIN HFA) 108 (90 Base) MCG/ACT inhaler Inhale 2 puffs into the lungs every 8 (eight) hours as needed.     Alpha-Lipoic Acid 200 MG CAPS Take 200 mg by mouth daily.     amLODipine (NORVASC) 5 MG  tablet Take 5 mg by mouth every evening.     atorvastatin (LIPITOR) 20 MG tablet Take 20 mg by mouth every evening.      budesonide-formoterol (SYMBICORT) 80-4.5 MCG/ACT inhaler Inhale 2 puffs into the lungs in the morning and at bedtime.     [START ON 03/09/2023] busPIRone (BUSPAR) 30 MG tablet Take 1 tablet (30 mg total) by mouth 2 (two) times daily. 60 tablet 1   CALCIUM PO Take 1,200 mg by mouth daily.     CLINPRO 5000 1.1 % PSTE SMARTSIG:Sparingly To Teeth Daily     clonazePAM (KLONOPIN) 0.5 MG tablet Take 0.5 mg by mouth 2 (two) times daily as needed for anxiety.     clopidogrel (PLAVIX) 75 MG tablet Take 75 mg by mouth daily.     Coenzyme Q10 (COQ-10) 100 MG CAPS Take 100 mg by mouth daily.     escitalopram (LEXAPRO) 20 MG tablet Take 20 mg by mouth daily.     furosemide (LASIX) 20 MG tablet Take 20 mg by mouth daily.     glucose blood (CONTOUR NEXT TEST) test strip TEST BLOOD SUGAR 7 TIMES A DAY AS DIRECTED     HUMALOG 100 UNIT/ML injection Via Insulin Pump     hydrocortisone 2.5 % cream Apply 1 Application topically See admin instructions. Apply on Monday Wednesday and Friday to affected areas of face 30 g 11   ketoconazole (NIZORAL) 2 % cream Apply 1 Application topically See admin instructions. Apply to face Tues Thurs Sat 30 g 11   latanoprost (XALATAN) 0.005 % ophthalmic solution Place 1 drop into both eyes at bedtime.     Multiple Vitamin (MULTIVITAMIN WITH MINERALS) TABS tablet Take 1 tablet by mouth daily.     pantoprazole (PROTONIX) 40 MG tablet Take 40 mg by mouth daily.     Potassium 99 MG TABS Take 2 tablets by mouth daily.     [START ON 03/09/2023] traZODone (DESYREL) 50 MG tablet Take 0.5-1 tablets (25-50 mg total) by mouth at bedtime as needed for sleep. 30 tablet 1   No current facility-administered medications for this visit.     Musculoskeletal: Strength & Muscle Tone:  N/A Gait &  Station:  N/A Patient leans: N/A  Psychiatric Specialty Exam: Review of Systems   Psychiatric/Behavioral:  Positive for sleep disturbance. Negative for agitation, behavioral problems, confusion, decreased concentration, dysphoric mood, hallucinations, self-injury and suicidal ideas. The patient is nervous/anxious. The patient is not hyperactive.   All other systems reviewed and are negative.   There were no vitals taken for this visit.There is no height or weight on file to calculate BMI.  General Appearance: Fairly Groomed  Eye Contact:  Good  Speech:   dysarthria  Volume:  Normal  Mood:   good  Affect:  Appropriate, Congruent, and calm  Thought Process:  Coherent  Orientation:  Full (Time, Place, and Person)  Thought Content: Logical   Suicidal Thoughts:  No  Homicidal Thoughts:  No  Memory:  Immediate;   Good  Judgement:  Good  Insight:  Good  Psychomotor Activity:  Normal  Concentration:  Concentration: Good and Attention Span: Good  Recall:  Good  Fund of Knowledge: Good  Language: Good  Akathisia:  No  Handed:  Right  AIMS (if indicated): not done  Assets:  Communication Skills Desire for Improvement  ADL's:  Intact  Cognition: WNL  Sleep:  Good   Screenings: AIMS    Flowsheet Row [facility]  AIMS Total Score 0      AUDIT    Flowsheet Row [facility]  Alcohol Use Disorder Identification Test Final Score (AUDIT) 0      GAD-7    Flowsheet Row Office Visit from 11/09/2022 in [facility]  Total GAD-7 Score 9      PHQ2-9    Flowsheet Row Office Visit [facility] 11[facility]09/2022 in [facility] Nutrition from 08/26/2021 in [facility] Total Score 2 0  PHQ-9 Total Score 6 --      Flowsheet Row ED to Hosp-Admission (Discharged) from [facility]11/2023 in [facility] (Discharged) from [facility]01[facility]2022 in [facility] Pre-Admission Testing 60 from 12/25/2021 in [facility]  C-SSRS RISK CATEGORY No Risk No Risk No Risk        Assessment and Plan:  Edgar Huynh is a 67 y.o. year old male with a history of depression, anxiety, Complex motor tics, type I diabetes, history of right pontine stroke,  TBI, hypertension, hyperlipidemia,   OSA on CPAP, OA, eosinophilic esophagitis, who presents for follow up appointment for below.   1. Panic disorder 2. MDD (major depressive disorder), recurrent, in partial remission (HCC) # r/o GAD Acute stressors include: loss of his mother, recent admission for DKA Other stressors include: Takes care of his wife, who uses a walker. Retirement (he describes satisfaction in this)    History: anxiety for many years. 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   He denies any significant mood symptoms since uptitration of BuSpar, which also coincided with being started on valbenazine for tardive dyskinesia.  Will continue Lexapro, BuSpar to target anxiety.   3. Insomnia, unspecified type - on CPAP machine Significantly improved since being on trazodone.  Will continue current dose to target insomnia.     # benzodiazepine dependence - on clonazepam 0.5 mg BID since 20's He is advised this medication to be tapered off in the future to  avoid long-term risks.   Plan Continue lexapro 20 mg daily  Continue buspirone 30 mg twice a day  Continue Trazodone 25-50 mg at night as needed for insomnia Next appointment: 9/11 at 11 30 for 30 mins, video  Collaboration of Care: Collaboration of Care: Other reviewed notes in Epic  Patient/Guardian was advised Release of Information must be obtained prior to any record release in order to collaborate their care with an outside provider.  Patient/Guardian was advised if they have not already done so to contact the registration department to sign all necessary forms in order for [place] to release information regarding their care.   Consent: Patient/Guardian gives verbal consent for treatment and assignment of benefits for services provided during this visit. Patient/Guardian expressed understanding and agreed to proceed.    Neysa Hotter, MD 02/23/2023, 11:29 AM

## 2023-02-23 ENCOUNTER — Telehealth (INDEPENDENT_AMBULATORY_CARE_PROVIDER_SITE_OTHER): Payer: Medicare Other | Admitting: Psychiatry

## 2023-02-23 ENCOUNTER — Encounter: Payer: Self-pay | Admitting: Psychiatry

## 2023-02-23 DIAGNOSIS — F410 Panic disorder [episodic paroxysmal anxiety] without agoraphobia: Secondary | ICD-10-CM

## 2023-02-23 DIAGNOSIS — G4700 Insomnia, unspecified: Secondary | ICD-10-CM

## 2023-02-23 DIAGNOSIS — F3341 Major depressive disorder, recurrent, in partial remission: Secondary | ICD-10-CM

## 2023-02-23 MED ORDER — BUSPIRONE HCL 30 MG PO TABS: 30.0000 mg | ORAL_TABLET | Freq: Two times a day (BID) | ORAL | 1 refills | Status: DC

## 2023-02-23 MED ORDER — TRAZODONE HCL 50 MG PO TABS: 25.0000 mg | ORAL_TABLET | Freq: Every evening | ORAL | 1 refills | Status: DC | PRN

## 2023-02-24 ENCOUNTER — Other Ambulatory Visit: Payer: Self-pay | Admitting: Student

## 2023-02-24 DIAGNOSIS — R4781 Slurred speech: Secondary | ICD-10-CM

## 2023-03-09 ENCOUNTER — Ambulatory Visit
Admission: RE | Admit: 2023-03-09 | Discharge: 2023-03-09 | Disposition: A | Discharge status: Home / Self Care | Payer: Medicare Other | Source: Ambulatory Visit | Attending: Student | Admitting: Student

## 2023-03-09 DIAGNOSIS — R4781 Slurred speech: Secondary | ICD-10-CM | POA: Diagnosis not present

## 2023-04-16 ENCOUNTER — Other Ambulatory Visit: Payer: Self-pay | Admitting: Psychiatry

## 2023-04-16 DIAGNOSIS — G4700 Insomnia, unspecified: Secondary | ICD-10-CM

## 2023-04-18 NOTE — Progress Notes (Addendum)
Virtual Visit via Video Note  I connected with Edgar Huynh on 04/27/23 at 11:30 AM EDT by a video enabled telemedicine application and verified that I am speaking with the correct person using two identifiers.  Location: Patient: home Provider: office Persons participated in the visit- patient, provider    I discussed the limitations of evaluation and management by telemedicine and the availability of in person appointments. The patient expressed understanding and agreed to proceed.     I discussed the assessment and treatment plan with the patient. The patient was provided an opportunity to ask questions and all were answered. The patient agreed with the plan and demonstrated an understanding of the instructions.   The patient was advised to call back or seek an in-person evaluation if the symptoms worsen or if the condition fails to improve as anticipated.  I provided 20 minutes of non-face-to-face time during this encounter.   Neysa Hotter, MD    [facility] MD/PA/NP OP Progress Note  04/27/2023 12:00 PM Edgar Huynh  MRN:  [id number]  Chief Complaint:  Chief Complaint  Patient presents with   Follow-up   HPI:  This is a follow-up appointment for depression, anxiety and insomnia.  He states that things are going fine.  He was seen by neurologist and was prescribed gabapentin.  Although his activity is limited due to pain in his leg in relation to Tic like movement, he thinks it has been getting a little better.  He was also referred for speech therapy.  He thinks it has been getting a little better since being on valbenazine.  He is planning to go shopping to be prepared for Halloween.  He enjoys seeing his grandchildren.  He discontinued Wegovy due to indigestion.  He is currently on GOLO.  He agrees to contact his primary care regarding this.  He sleeps well with trazodone.  He denies feeling depressed or anxiety.  He takes clonazepam as a habit.  He expressed understanding to try  taking less, and consulting the provider who is prescribing this medication.  He denies SI.  He denies alcohol use or drug.  He feels comfortable to stay on the current medication regimen.    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  He states that although they were not rich, his parents provided enough to him.    Substance use   Tobacco Alcohol Other substances/  Current denies denies denies  Past denies 12 pack on weekend, not since 2018 denies  Past Treatment           Visit Diagnosis:    ICD-10-CM   1. Panic disorder  F41.0     2. Insomnia, unspecified type  G47.00 traZODone (DESYREL) 50 MG tablet    3. MDD (major depressive disorder), recurrent, in partial remission (HCC)  F33.41       Past Psychiatric History: Please see initial evaluation for full details. I have reviewed the history. No updates at this time.     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   TIA (transient ischemic attack)    Type 1 diabetes (HCC)    on insulin pump    Past Surgical History:  Procedure Laterality Date   CAROTID ARTERY ANGIOPLASTY Right 10/18/2004   CATARACT EXTRACTION, BILATERAL Bilateral     COLONOSCOPY     COLONOSCOPY WITH PROPOFOL N/A 08/25/2018   Procedure: COLONOSCOPY WITH PROPOFOL;  Surgeon: Scot Jun, MD;  Location: [facility] ENDOSCOPY;  Service: Endoscopy;  Laterality: N/A;   CYSTOSCOPY     ESOPHAGOGASTRODUODENOSCOPY     ESOPHAGOGASTRODUODENOSCOPY N/A 09/01/2022   Procedure: ESOPHAGOGASTRODUODENOSCOPY (EGD);  Surgeon: Toledo, Boykin Nearing, MD;  Location: [facility] ENDOSCOPY;  Service: Gastroenterology;  Laterality: N/A;   ESOPHAGOGASTRODUODENOSCOPY (EGD) WITH PROPOFOL N/A 08/25/2018   Procedure: ESOPHAGOGASTRODUODENOSCOPY (EGD) WITH PROPOFOL;  Surgeon: Scot Jun, MD;  Location: [facility] ENDOSCOPY;  Service: Endoscopy;  Laterality: N/A;   EYE SURGERY     PARTIAL KNEE ARTHROPLASTY Left 01/05/2022   Procedure: UNICOMPARTMENTAL KNEE;  Surgeon: Christena Flake, MD;  Location: [facility] ORS;  Service: Orthopedics;  Laterality: Left;   WISDOM TOOTH EXTRACTION      Family Psychiatric History: Please see initial evaluation for full details. I have reviewed the history. No updates at this time.    Family History:  Family History  Problem Relation Age of Onset   Parkinson's disease Father    Diabetes Mellitus II Brother     Social History:  Social History   Socioeconomic History   Marital status: Married    Spouse name: Edgar Huynh   Number of children: 2   Years of education: Not on file   Highest education level: Some college, no degree  Occupational History   Not on file  Tobacco Use   Smoking status: Never   Smokeless tobacco: Never  Vaping Use   Vaping status: Never Used  Substance and Sexual Activity   Alcohol use: Not Currently    Comment: occasional   Drug use: Never   Sexual activity: Not on file    Comment: not asked if sexually active  Other Topics Concern   Not on file  Social History Narrative   Not on file   Social Determinants of Health   Financial Resource Strain: Low Risk  (02/03/2023)   [facility]   Overall Financial Resource Strain (CARDIA)    Difficulty of Paying Living Expenses: Not hard at all  Food Insecurity: No Food Insecurity (02/12/2023)   Hunger Vital Sign    Worried About Running Out of Food in the Last Year: Never true    Ran Out of Food in the Last Year: Never true  Transportation Needs: No Transportation Needs (02/12/2023)   PRAPARE - Administrator, Civil Service (Medical): No    Lack of Transportation (Non-Medical): No  Physical Activity: Not on file  Stress: Not on file  Social Connections: Not on file    Allergies:  Allergies  Allergen Reactions   Other Rash    Clonidine 0.3 mg patch (adhesive caused the  irritation)    Metabolic Disorder Labs: Lab Results  Component Value Date   HGBA1C 7.2 (H) 04/09/2021   MPG 159.94 04/09/2021   MPG 151 03/02/2017   Lab Results  Component Value Date   PROLACTIN 25.6 (H) 03/02/2017   Lab Results  Component Value Date   CHOL 151 04/10/2021   TRIG 134 04/10/2021   HDL 46 04/10/2021   CHOLHDL 3.3 04/10/2021   VLDL 27 04/10/2021   LDLCALC 78 04/10/2021   LDLCALC 119 (H) 03/02/2017   Lab Results  Component Value Date   TSH 1.582 11/09/2022   TSH 0.937 04/10/2021    Therapeutic Level Labs: No results found for: "LITHIUM" No results found for: "VALPROATE" No results found for: "CBMZ"  Current Medications: Current Outpatient Medications  Medication Sig Dispense Refill   gabapentin (NEURONTIN) 100 MG capsule Take 300 mg by mouth 3 (three) times daily.     albuterol (VENTOLIN HFA) 108 (90 Base) MCG/ACT inhaler Inhale 2 puffs into the lungs every 8 (eight) hours as needed.     Alpha-Lipoic Acid 200 MG CAPS Take 200 mg by mouth daily.     amLODipine (NORVASC) 5 MG tablet Take 5 mg by mouth every evening.     atorvastatin (LIPITOR) 20 MG tablet Take 20 mg by mouth every evening.      budesonide-formoterol (SYMBICORT) 80-4.5 MCG/ACT inhaler Inhale 2 puffs into the lungs in the morning  and at bedtime.     [START ON 05/08/2023] busPIRone (BUSPAR) 30 MG tablet Take 1 tablet (30 mg total) by mouth 2 (two) times daily. 60 tablet 1   CALCIUM PO Take 1,200 mg by mouth daily.     CLINPRO 5000 1.1 % PSTE SMARTSIG:Sparingly To Teeth Daily     clonazePAM (KLONOPIN) 0.5 MG tablet Take 0.5 mg by mouth 2 (two) times daily as needed for anxiety.     clopidogrel (PLAVIX) 75 MG tablet Take 75 mg by mouth daily.     Coenzyme Q10 (COQ-10) 100 MG CAPS Take 100 mg by mouth daily.     escitalopram (LEXAPRO) 20 MG tablet Take 20 mg by mouth daily.     furosemide (LASIX) 20 MG tablet Take 20 mg by mouth daily.     glucose blood (CONTOUR NEXT TEST) test strip TEST BLOOD SUGAR 7 TIMES A DAY AS DIRECTED     HUMALOG 100 UNIT/ML injection Via Insulin Pump     hydrocortisone 2.5 % cream Apply 1 Application topically See admin instructions. Apply on Monday Wednesday and Friday to affected areas of face 30 g 11   ketoconazole (NIZORAL) 2 % cream Apply 1 Application topically See admin instructions. Apply to face Tues Thurs Sat 30 g 11   latanoprost (XALATAN) 0.005 % ophthalmic solution Place 1 drop into both eyes at bedtime.     Multiple Vitamin (MULTIVITAMIN WITH MINERALS) TABS tablet Take 1 tablet by mouth daily.     pantoprazole (PROTONIX) 40 MG tablet Take 40 mg by mouth daily.     Potassium 99 MG TABS Take 2 tablets by mouth daily.     [START ON 05/19/2023] traZODone (DESYREL) 50 MG tablet Take 0.5-1 tablets (25-50 mg total) by mouth at bedtime as needed for sleep. 30 tablet 2   valbenazine (INGREZZA) 80 MG capsule Take 80 mg by mouth daily.     No current facility-administered medications for this visit.     Musculoskeletal: Strength & Muscle Tone:  N/A Gait & Station:  N/A Patient leans: N/A  Psychiatric Specialty  Exam: Review of Systems  Psychiatric/Behavioral:  Negative for agitation, behavioral problems, confusion, decreased concentration, dysphoric mood, hallucinations, self-injury, sleep  disturbance and suicidal ideas. The patient is not nervous/anxious and is not hyperactive.   All other systems reviewed and are negative.   There were no vitals taken for this visit.There is no height or weight on file to calculate BMI.  General Appearance: Fairly Groomed  Eye Contact:  Good  Speech:  Clear and Coherent  Volume:  Normal  Mood:   good  Affect:  Appropriate, Congruent, and calm  Thought Process:  Coherent  Orientation:  Full (Time, Place, and Person)  Thought Content: Logical   Suicidal Thoughts:  No  Homicidal Thoughts:  No  Memory:  Immediate;   Good  Judgement:  Good  Insight:  Good  Psychomotor Activity:  Normal  Concentration:  Concentration: Good and Attention Span: Good  Recall:  Good  Fund of Knowledge: Good  Language: Good  Akathisia:  No  Handed:  Right  AIMS (if indicated): not done  Assets:  Communication Skills Desire for Improvement  ADL's:  Intact  Cognition: WNL  Sleep:  Good   Screenings: AIMS    Flowsheet Row [facility]  AIMS Total Score 0      AUDIT    Flowsheet Row [facility]  Alcohol Use Disorder Identification Test Final Score (AUDIT) 0      GAD-7    Flowsheet Row Office Visit from [facility]/09/20[facility] in [facility]  Total GAD-7 Score 9      PHQ2-9    Flowsheet Row Office Visit from 11/09/2022 in [facility] Nutrition from 08/26/2021 in [facility] Total Score 2 0  PHQ-9 Total Score 6 --      Flowsheet Row ED to Hosp-Admission (Discharged) from [facility]11/2023 in [facility] Admission (Discharged) from 09/01/2022 in [facility] Pre-Admission Testing 60 from 12/25/2021 in  [facility] PRE [facility] TESTING  C-SSRS RISK CATEGORY No Risk No Risk No Risk        Assessment and Plan:  EFRAIM TEUBNER is a 67 y.o. year old male with a history of depression, anxiety, Complex motor tics, type I diabetes, history of right pontine stroke,  TBI, hypertension, hyperlipidemia,   OSA on CPAP, OA, eosinophilic esophagitis, who presents for follow up appointment for below.      2. Panic disorder 3. MDD (major depressive disorder), recurrent, in partial remission (HCC) # r/o GAD Acute stressors include: loss of his mother, recent admission for DKA Other stressors include: Takes care of his wife, who uses a walker. Retirement (he describes satisfaction in this)    History: anxiety for many years. 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   Significant improvement in his mood symptoms since uptitration of buspar.  Will continue Lexapro and BuSpar to target anxiety.   1. Insomnia, unspecified type - on CPAP machine He reports significant benefit from trazodone.  Will continue current dose to target insomnia.     # benzodiazepine dependence - on clonazepam 0.5 mg BID since 20's Unchanged. The medication is prescribed by another provider. He is advised this medication to be tapered off in the future to avoid long-term risks.   Plan Continue lexapro 20  mg daily  Continue buspirone 30 mg twice a day  Continue Trazodone 25-50 mg at night as needed for insomnia Next appointment: 12/4 at 11 am for 30 mins, video - on gabapentin 300 mg TID for tic, ingrezza 80 mg daily for slurred speech - on GOLO, OTC  The patient demonstrates the following risk factors for suicide: Chronic risk factors for suicide include: psychiatric disorder of depression, anxiety . Acute risk factors for suicide include: loss (financial, interpersonal, professional). Protective factors for this patient include: coping skills and hope for the future.  Considering these factors, the overall suicide risk at this point appears to be low. Patient is appropriate for outpatient follow up.   Collaboration of Care: Collaboration of Care: Other reviewed notes in Epic  Patient/Guardian was advised Release of Information must be obtained prior to any record release in order to collaborate their care with an outside provider. Patient/Guardian was advised if they have not already done so to contact the registration department to sign all necessary forms in order for [place] to release information regarding their care.   Consent: Patient/Guardian gives verbal consent for treatment and assignment of benefits for services provided during this visit. Patient/Guardian expressed understanding and agreed to proceed.    Neysa Hotter, MD 04/27/2023, 12:00 PM

## 2023-04-19 MED ORDER — TRAZODONE HCL 50 MG PO TABS
25.0000 mg | ORAL_TABLET | Freq: Every evening | ORAL | 0 refills | Status: DC | PRN
Start: 2023-04-19 — End: 2023-04-27

## 2023-04-19 NOTE — Telephone Encounter (Signed)
I have sent trazodone to pharmacy as requested.

## 2023-04-26 ENCOUNTER — Other Ambulatory Visit: Payer: Self-pay | Admitting: Psychiatry

## 2023-04-27 ENCOUNTER — Telehealth (INDEPENDENT_AMBULATORY_CARE_PROVIDER_SITE_OTHER): Payer: Medicare Other | Admitting: Psychiatry

## 2023-04-27 ENCOUNTER — Encounter: Payer: Self-pay | Admitting: Psychiatry

## 2023-04-27 DIAGNOSIS — F410 Panic disorder [episodic paroxysmal anxiety] without agoraphobia: Secondary | ICD-10-CM | POA: Diagnosis not present

## 2023-04-27 DIAGNOSIS — G4700 Insomnia, unspecified: Secondary | ICD-10-CM | POA: Diagnosis not present

## 2023-04-27 DIAGNOSIS — F3341 Major depressive disorder, recurrent, in partial remission: Secondary | ICD-10-CM | POA: Diagnosis not present

## 2023-04-27 MED ORDER — TRAZODONE HCL 50 MG PO TABS
25.0000 mg | ORAL_TABLET | Freq: Every evening | ORAL | 2 refills | Status: DC | PRN
Start: 2023-05-19 — End: 2023-06-06

## 2023-04-27 NOTE — Telephone Encounter (Signed)
Dr.Hisada's patient °

## 2023-04-27 NOTE — Patient Instructions (Signed)
Continue lexapro 20 mg daily  Continue buspirone 30 mg twice a day  Continue Trazodone 25-50 mg at night as needed for insomnia Next appointment: 12/4 at 11 am

## 2023-04-27 NOTE — Telephone Encounter (Signed)
Called the pharmacy for the second time spoke with [name] she stated that the Buspar 30 mg is ready for pick up called patient to make aware of Rx ready he still says what the app is saying I tried explaining to him he never understood he stated that he would just go to the pharmacy and get it straight

## 2023-05-12 ENCOUNTER — Ambulatory Visit
Admission: RE | Admit: 2023-05-12 | Discharge: 2023-05-12 | Disposition: A | Discharge status: Home / Self Care | Payer: Medicare Other | Source: Ambulatory Visit | Attending: Family Medicine | Admitting: Family Medicine

## 2023-05-12 ENCOUNTER — Other Ambulatory Visit: Payer: Self-pay | Admitting: Family Medicine

## 2023-05-12 DIAGNOSIS — R52 Pain, unspecified: Secondary | ICD-10-CM | POA: Insufficient documentation

## 2023-05-12 DIAGNOSIS — R4789 Other speech disturbances: Secondary | ICD-10-CM | POA: Diagnosis present

## 2023-05-12 DIAGNOSIS — R269 Unspecified abnormalities of gait and mobility: Secondary | ICD-10-CM | POA: Insufficient documentation

## 2023-05-12 DIAGNOSIS — R5383 Other fatigue: Secondary | ICD-10-CM | POA: Insufficient documentation

## 2023-05-13 ENCOUNTER — Encounter: Payer: Self-pay | Admitting: Family Medicine

## 2023-05-17 NOTE — Telephone Encounter (Signed)
Called pharmacy spoke to [facility] and he stated that the medication would be filled today

## 2023-05-21 ENCOUNTER — Other Ambulatory Visit: Payer: Self-pay | Admitting: Psychiatry

## 2023-05-21 DIAGNOSIS — G4700 Insomnia, unspecified: Secondary | ICD-10-CM

## 2023-05-24 ENCOUNTER — Telehealth: Payer: Self-pay

## 2023-05-24 NOTE — Telephone Encounter (Signed)
Please check with the pharmacy; he should have Trazodone refills available. Also, notify the patient with any updates. Thanks.

## 2023-05-24 NOTE — Telephone Encounter (Signed)
pt called states he was returning a call about his trazondone. Pt was told that it look as thou one of the other nurses called pharmacy and the pharmacy should have your rx ready. pt also told that a message was sent to his mychart today around 3:22pm.  Pt was told to check with his pharmacy that they should be getting it ready  he was advised that if he had any issues to call our office back

## 2023-05-27 IMAGING — MR MR KNEE*L* W/O CM
6 series · 40 of 40 positions shown · non-contrast
Comparison: None.

CLINICAL DATA: Left knee pain for 4 months

EXAM:
MRI OF THE LEFT KNEE WITHOUT CONTRAST
TECHNIQUE: Multiplanar, multisequence MR imaging of the knee was performed. No
intravenous contrast was administered.

[Series 15: T2 fat-sat · axial · left · 4.0mm · 0.50mm/px · z∈[-90,+35]mm · 5 of 26 slices shown (1 of 3)]
[im 1/26]
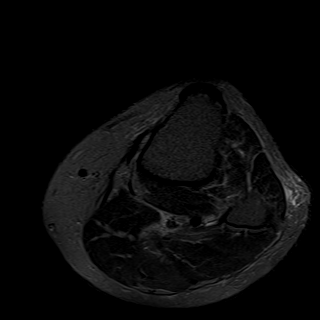
[im 7/26]
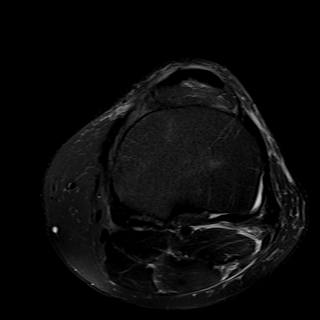
[im 13/26]
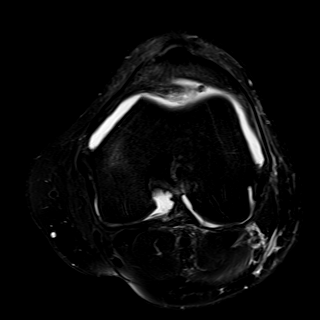
[im 19/26]
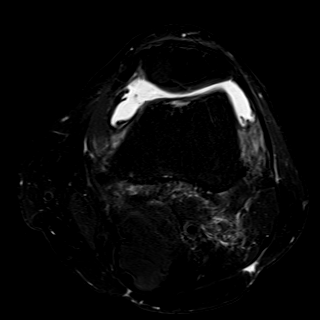
[im 26/26]
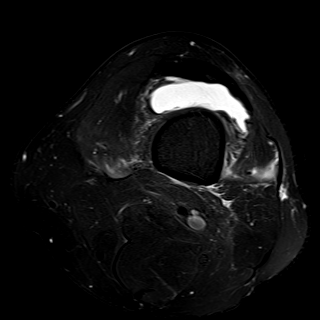

[Series 16: T1 · coronal · left · 4.0mm · 0.47mm/px · 7 of 32 slices shown]
[im 1/32]
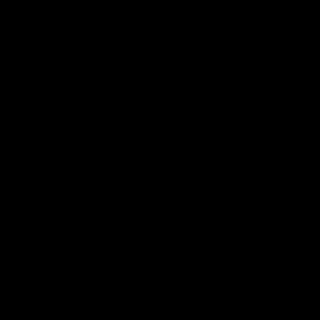
[im 6/32]
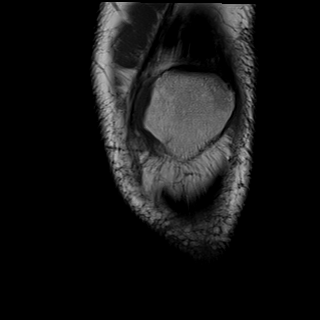
[im 11/32]
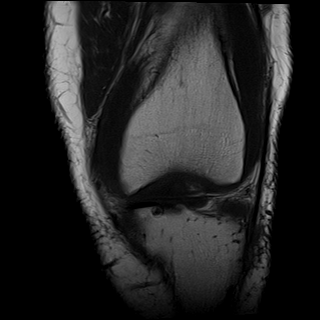
[im 16/32]
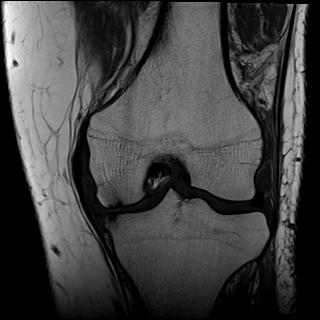
[im 21/32]
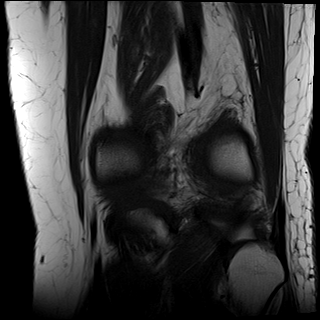
[im 26/32]
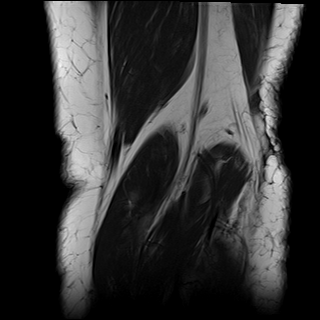
[im 32/32]
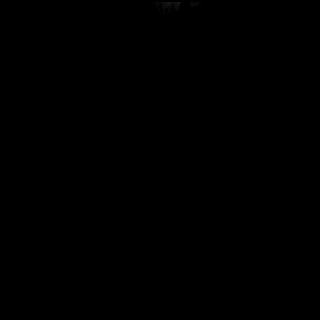

[Series 17: T2 fat-sat · coronal · left · 4.0mm · 0.47mm/px · 7 of 32 slices shown (2 of 3)]
[im 1/32]
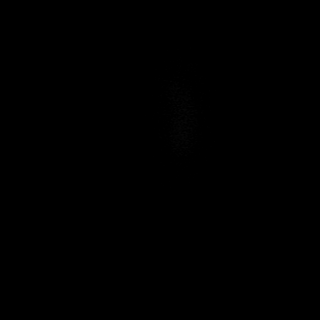
[im 6/32]
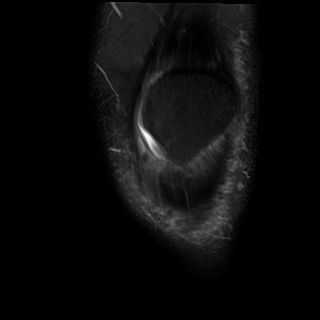
[im 11/32]
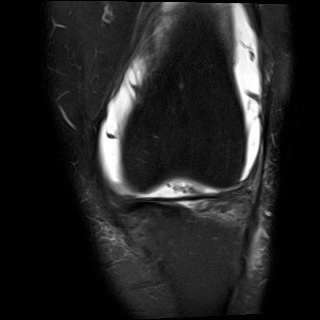
[im 16/32]
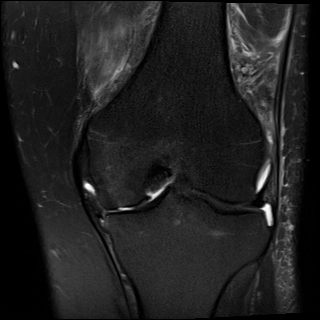
[im 21/32]
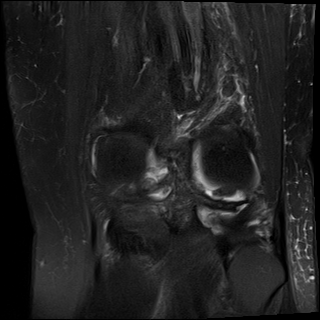
[im 26/32]
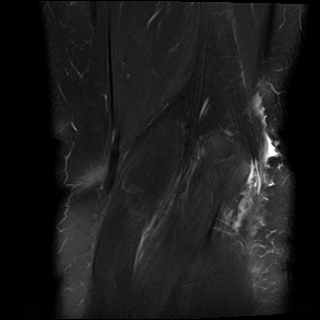
[im 32/32]
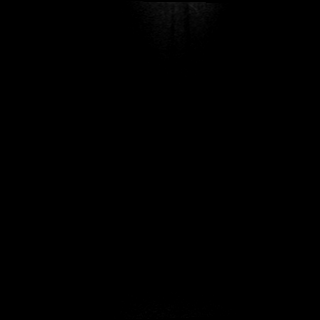

[Series 18: PD fat-sat · coronal · left · 4.0mm · 0.59mm/px · 7 of 32 slices shown (1 of 2)]
[im 1/32]
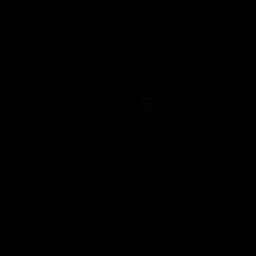
[im 6/32]
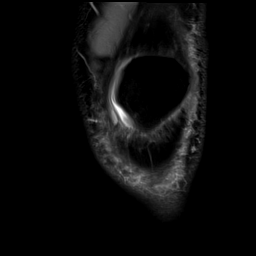
[im 11/32]
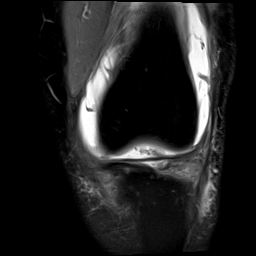
[im 16/32]
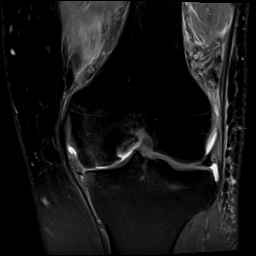
[im 21/32]
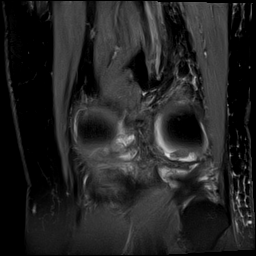
[im 26/32]
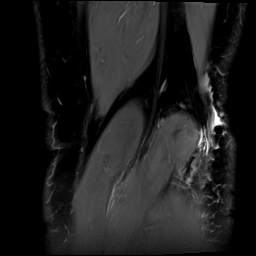
[im 32/32]
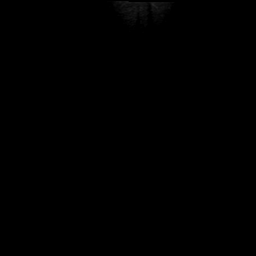

[Series 19: PD fat-sat · sagittal · left · 3.0mm · 0.47mm/px · 7 of 34 slices shown (2 of 2)]
[im 1/34]
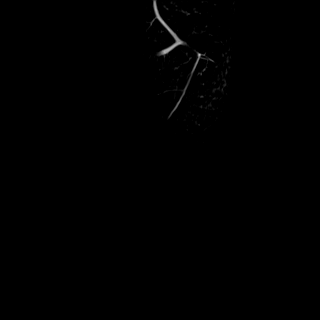
[im 6/34]
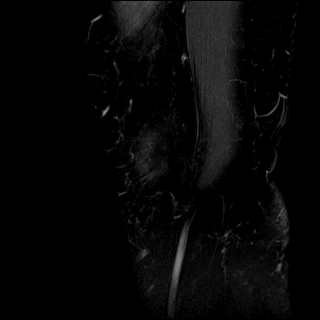
[im 12/34]
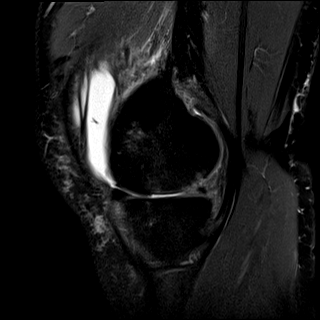
[im 17/34]
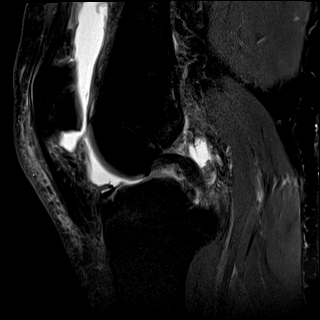
[im 23/34]
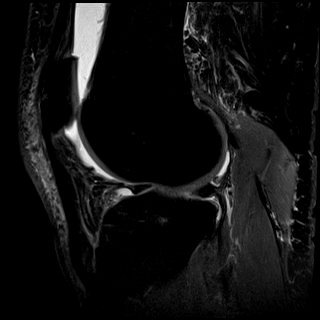
[im 28/34]
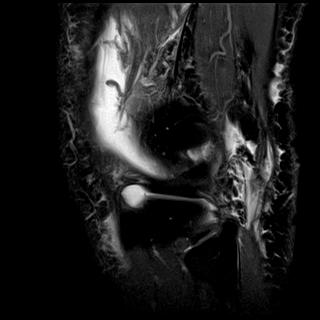
[im 34/34]
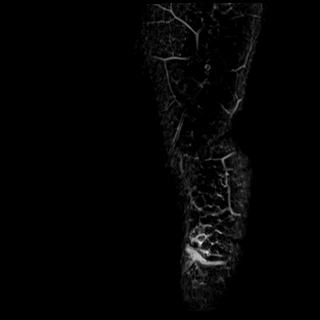

[Series 20: T2 fat-sat · sagittal · left · 3.0mm · 0.47mm/px · 7 of 35 slices shown (3 of 3)]
[im 1/35]
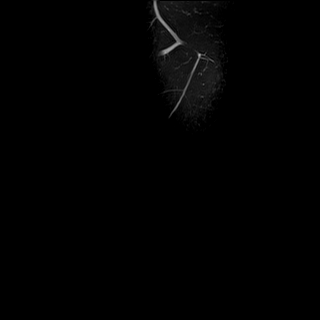
[im 6/35]
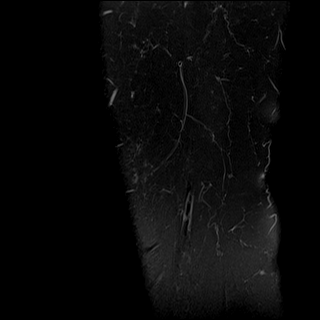
[im 12/35]
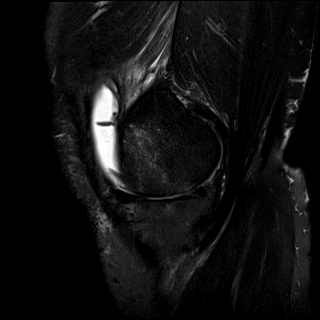
[im 18/35]
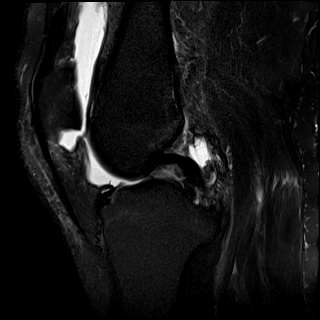
[im 23/35]
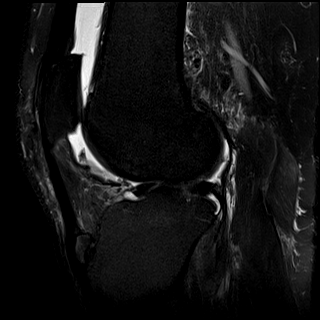
[im 29/35]
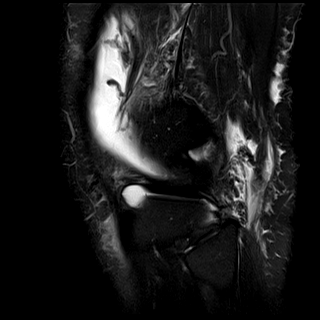
[im 35/35]
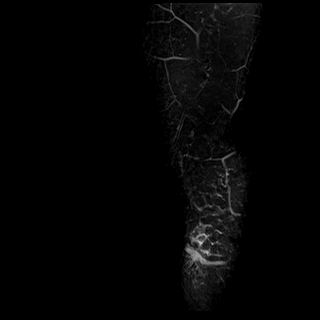

[40 of 40 positions shown; findings below may reference images not displayed]

FINDINGS: MENISCI

Medial: Extensive complex tear of the posterior horn of the medial
meniscus. Maceration of the body of the medial meniscus.

Lateral: Fraying along the free edge of the posterior horn of the
lateral meniscus.

LIGAMENTS

Cruciates: ACL and PCL are intact.

Collaterals: Medial collateral ligament is intact. Lateral
collateral ligament complex is intact.

CARTILAGE

Patellofemoral:  No chondral defect.

Medial: High-grade partial-thickness cartilage loss with areas of
full-thickness cartilage loss of the medial femorotibial compartment
and subchondral marrow edema.

Lateral: Mild partial-thickness cartilage loss of the lateral tibial
plateau.

JOINT: Large joint effusion. Mild edema in Hoffa's fat. No plical
thickening.

POPLITEAL FOSSA: Popliteus tendon is intact. No Baker's cyst.

EXTENSOR MECHANISM: Intact quadriceps tendon. Intact patellar
tendon. Intact lateral patellar retinaculum. Intact medial patellar
retinaculum. Intact MPFL.

BONES: No aggressive osseous lesion. No fracture or dislocation.
Fragmentation of the tibial tuberosity.

Other: No fluid collection or hematoma. Muscles are normal.
IMPRESSION: 1. Extensive complex tear of the posterior horn of the medial
meniscus. Maceration of the body of the medial meniscus.
2. Fraying along the free edge of the posterior horn of the lateral
meniscus.
3. High-grade partial-thickness cartilage loss with areas of
full-thickness cartilage loss of the medial femorotibial compartment
and subchondral marrow edema.
4. Mild partial-thickness cartilage loss of the lateral tibial
plateau.

## 2023-06-06 ENCOUNTER — Other Ambulatory Visit: Payer: Self-pay | Admitting: Psychiatry

## 2023-06-06 MED ORDER — TRAZODONE HCL 100 MG PO TABS: 100.0000 mg | ORAL_TABLET | Freq: Every day | ORAL | 1 refills | Status: DC

## 2023-06-06 NOTE — Telephone Encounter (Signed)
Called pharmacy as instructed by provider spoke to [facility] she stated that the trazodone 50 mg and the refills has been cancelled

## 2023-06-06 NOTE — Telephone Encounter (Signed)
Could you contact [facility] in [place], and cancel trazodone 50 mg refills? 100 mg was sent in, fyi.

## 2023-06-17 ENCOUNTER — Other Ambulatory Visit: Payer: Self-pay | Admitting: Physician Assistant

## 2023-06-17 ENCOUNTER — Ambulatory Visit
Admission: RE | Admit: 2023-06-17 | Discharge: 2023-06-17 | Disposition: A | Discharge status: Home / Self Care | Payer: Medicare Other | Source: Ambulatory Visit | Attending: Physician Assistant | Admitting: Physician Assistant

## 2023-06-17 DIAGNOSIS — M79661 Pain in right lower leg: Secondary | ICD-10-CM | POA: Diagnosis present

## 2023-06-17 DIAGNOSIS — M7989 Other specified soft tissue disorders: Secondary | ICD-10-CM | POA: Insufficient documentation

## 2023-07-05 ENCOUNTER — Ambulatory Visit
Admission: RE | Admit: 2023-07-05 | Discharge: 2023-07-05 | Disposition: A | Discharge status: Home / Self Care | Payer: Medicare Other | Source: Ambulatory Visit | Attending: Family Medicine | Admitting: Family Medicine

## 2023-07-05 ENCOUNTER — Other Ambulatory Visit: Payer: Self-pay | Admitting: Family Medicine

## 2023-07-05 DIAGNOSIS — R29898 Other symptoms and signs involving the musculoskeletal system: Secondary | ICD-10-CM | POA: Insufficient documentation

## 2023-07-05 DIAGNOSIS — R269 Unspecified abnormalities of gait and mobility: Secondary | ICD-10-CM | POA: Diagnosis present

## 2023-07-05 DIAGNOSIS — R4781 Slurred speech: Secondary | ICD-10-CM | POA: Insufficient documentation

## 2023-07-05 MED ORDER — GADOBUTROL 1 MMOL/ML IV SOLN
8.0000 mL | Freq: Once | INTRAVENOUS | Status: AC | PRN
  Administered 2023-07-05: 8 mL via INTRAVENOUS

## 2023-07-13 ENCOUNTER — Other Ambulatory Visit: Payer: Self-pay | Admitting: Psychiatry

## 2023-07-16 NOTE — Progress Notes (Unsigned)
Virtual Visit via Video Note  I connected with Edgar Huynh on 07/20/23 at 11:00 AM EST by a video enabled telemedicine application and verified that I am speaking with the correct person using two identifiers.  Location: Patient: home Provider: office Persons participated in the visit- patient, provider    I discussed the limitations of evaluation and management by telemedicine and the availability of in person appointments. The patient expressed understanding and agreed to proceed.     I discussed the assessment and treatment plan with the patient. The patient was provided an opportunity to ask questions and all were answered. The patient agreed with the plan and demonstrated an understanding of the instructions.   The patient was advised to call back or seek an in-person evaluation if the symptoms worsen or if the condition fails to improve as anticipated.  I provided 20 minutes of non-face-to-face time during this encounter.   Neysa Hotter, MD    [facility] MD/PA/NP OP Progress Note  07/20/2023 11:29 AM Edgar Huynh  MRN:  [id number]  Chief Complaint:  Chief Complaint  Patient presents with   Follow-up   HPI:  This is a follow-up appointment for depression, panic disorder and insomnia.  He states that he has been doing well.  He is getting ready for Christmas.  He was able to visit his son's family and his daughter's family.  He enjoys being around with his grandchildren.  He thinks he slurred speech she has been getting better.  He was diagnosed with myasthenia gravis, and was started on pyridostigmine 60 mg (reportedly seen by Dr. Sherryll Burger 11/20, although the record is not available in the system).  He will be seen by a neurologist at [facility] in April.   He has some days, struggling with drowsiness.  He attributes this to gabapentin.  He agrees to discuss this with his neurologist. He denies feeling depressed.  His anxiety is manageable, and he denies panic attacks.  He denies change in  appetite.  He denies SI.   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  He states that although they were not rich, his parents provided enough to him.    Substance use   Tobacco Alcohol Other substances/  Current denies denies denies  Past denies 12 pack on weekend, not since 2018 denies  Past Treatment             Visit Diagnosis:    ICD-10-CM   1. MDD (major depressive disorder), recurrent, in partial remission (HCC)  F33.41     2. Panic disorder  F41.0     3. Insomnia, unspecified type  G47.00       Past Psychiatric History: Please see initial evaluation for full details. I have reviewed the history. No updates at this time.     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   TIA (transient ischemic attack)    Type 1 diabetes (HCC)    on insulin pump    Past Surgical History:  Procedure Laterality Date   CAROTID ARTERY ANGIOPLASTY Right 10/18/2004   CATARACT EXTRACTION, BILATERAL Bilateral    COLONOSCOPY     COLONOSCOPY WITH PROPOFOL N/A 08/25/2018   Procedure: COLONOSCOPY WITH PROPOFOL;  Surgeon: Scot Jun, MD;  Location: [facility] ENDOSCOPY;  Service: Endoscopy;  Laterality: N/A;   CYSTOSCOPY     ESOPHAGOGASTRODUODENOSCOPY     ESOPHAGOGASTRODUODENOSCOPY N/A 09/01/2022   Procedure: ESOPHAGOGASTRODUODENOSCOPY (EGD);   Surgeon: Toledo, Boykin Nearing, MD;  Location: [facility] ENDOSCOPY;  Service: Gastroenterology;  Laterality: N/A;   ESOPHAGOGASTRODUODENOSCOPY (EGD) WITH PROPOFOL N/A 08/25/2018   Procedure: ESOPHAGOGASTRODUODENOSCOPY (EGD) WITH PROPOFOL;  Surgeon: Scot Jun, MD;  Location: [facility] ENDOSCOPY;  Service: Endoscopy;  Laterality: N/A;   EYE SURGERY     PARTIAL KNEE ARTHROPLASTY Left 01/05/2022   Procedure: UNICOMPARTMENTAL KNEE;  Surgeon: Christena Flake, MD;  Location: [facility] ORS;  Service: Orthopedics;  Laterality: Left;   WISDOM TOOTH EXTRACTION      Family Psychiatric History: Please see initial evaluation for full details. I have reviewed the history. No updates at this time.     Family History:  Family History  Problem Relation Age of Onset   Parkinson's disease Father    Diabetes Mellitus II Brother     Social History:  Social History   Socioeconomic History   Marital status: Married    Spouse name: [name]   Number of children: 2   Years of education: Not on file   Highest education level: Some college, no degree  Occupational History   Not on file  Tobacco Use   Smoking status: Never   Smokeless tobacco: Never  Vaping Use   Vaping status: Never Used  Substance and Sexual Activity   Alcohol use: Not Currently    Comment: occasional   Drug use: Never   Sexual activity: Not on file    Comment: not asked if sexually active  Other Topics Concern   Not on file  Social History Narrative   Not on file   Social Determinants of Health   Financial Resource Strain: Low Risk  (06/02/2023)   Received from [facility]   Overall Financial Resource Strain (CARDIA)    Difficulty of Paying Living Expenses: Not hard at all  Food Insecurity: No Food Insecurity (02/12/2023)   Hunger Vital Sign    Worried About Running Out of Food in the Last Year: Never true    Ran Out of Food in the Last Year: Never true  Transportation Needs: No Transportation Needs (02/12/2023)    PRAPARE - Administrator, Civil Service (Medical): No    Lack of Transportation (Non-Medical): No  Physical Activity: Not on file  Stress: Not on file  Social Connections: Not on file    Allergies:  Allergies  Allergen Reactions   Other Rash    Clonidine 0.3 mg patch (adhesive caused the irritation)    Metabolic Disorder Labs: Lab Results  Component Value Date   HGBA1C 7.2 (H) 04/09/2021   MPG 159.94 04/09/2021   MPG 151 03/02/2017   Lab Results  Component Value Date   PROLACTIN 25.6 (H) 03/02/2017   Lab Results  Component Value Date   CHOL 151 04/10/2021   TRIG 134 04/10/2021   HDL 46 04/10/2021   CHOLHDL 3.3  04/10/2021   VLDL 27 04/10/2021   LDLCALC 78 04/10/2021   LDLCALC 119 (H) 03/02/2017   Lab Results  Component Value Date   TSH 1.582 11/09/2022   TSH 0.937 04/10/2021    Therapeutic Level Labs: No results found for: "LITHIUM" No results found for: "VALPROATE" No results found for: "CBMZ"  Current Medications: Current Outpatient Medications  Medication Sig Dispense Refill   albuterol (VENTOLIN HFA) 108 (90 Base) MCG/ACT inhaler Inhale 2 puffs into the lungs every 8 (eight) hours as needed.     Alpha-Lipoic Acid 200 MG CAPS Take 200 mg by mouth daily.     amLODipine (NORVASC) 5 MG tablet Take 5 mg by mouth every evening.     atorvastatin (LIPITOR) 20 MG tablet Take 20 mg by mouth every evening.      budesonide-formoterol (SYMBICORT) 80-4.5 MCG/ACT inhaler Inhale 2 puffs into the lungs in the morning and at bedtime.     [START ON 08/16/2023] busPIRone (BUSPAR) 30 MG tablet Take 1 tablet (30 mg total) by mouth 2 (two) times daily. 60 tablet 3   CALCIUM PO Take 1,200 mg by mouth daily.     CLINPRO 5000 1.1 % PSTE SMARTSIG:Sparingly To Teeth Daily     clonazePAM (KLONOPIN) 0.5 MG tablet Take 0.5 mg by mouth 2 (two) times daily as needed for anxiety.     clopidogrel (PLAVIX) 75 MG tablet Take 75 mg by mouth daily.     Coenzyme Q10 (COQ-10) 100 MG  CAPS Take 100 mg by mouth daily.     escitalopram (LEXAPRO) 20 MG tablet Take 20 mg by mouth daily.     furosemide (LASIX) 20 MG tablet Take 20 mg by mouth daily.     gabapentin (NEURONTIN) 100 MG capsule Take 300 mg by mouth 3 (three) times daily.     glucose blood (CONTOUR NEXT TEST) test strip TEST BLOOD SUGAR 7 TIMES A DAY AS DIRECTED     HUMALOG 100 UNIT/ML injection Via Insulin Pump     hydrocortisone 2.5 % cream Apply 1 Application topically See admin instructions. Apply on Monday Wednesday and Friday to affected areas of face 30 g 11   ketoconazole (NIZORAL) 2 % cream Apply 1 Application topically See admin instructions. Apply to face Tues Thurs Sat 30 g 11   latanoprost (XALATAN) 0.005 % ophthalmic solution Place 1 drop into both eyes at bedtime.     Multiple Vitamin (MULTIVITAMIN WITH MINERALS) TABS tablet Take 1 tablet by mouth daily.     pantoprazole (PROTONIX) 40 MG tablet Take 40 mg by mouth daily.     Potassium 99 MG TABS Take 2 tablets by mouth daily.     [START ON 08/05/2023] traZODone (DESYREL) 100 MG tablet Take 1 tablet (100 mg total) by mouth at bedtime. 30 tablet 3   valbenazine (INGREZZA) 80 MG capsule Take 80 mg by mouth daily.     No current facility-administered medications for this visit.     Musculoskeletal: Strength & Muscle Tone:  N/A Gait & Station:  N/A Patient leans: N/A  Psychiatric Specialty Exam: Review of Systems  Psychiatric/Behavioral:  Negative for agitation, behavioral problems, confusion, decreased concentration, dysphoric mood, hallucinations, self-injury, sleep disturbance and suicidal ideas. The patient is nervous/anxious. The patient is not hyperactive.   All other systems reviewed and are negative.   There were no vitals taken for this visit.There is no height or weight on file to calculate BMI.  General Appearance: Well Groomed  Eye Contact:  Good  Speech:  Slurred  Volume:  Normal  Mood:   good  Affect:  Appropriate, Congruent, and  Full Range  Thought Process:  Coherent  Orientation:  Full (Time, Place, and Person)  Thought Content: Logical   Suicidal Thoughts:  No  Homicidal Thoughts:  No  Memory:  Immediate;   Good  Judgement:  Good  Insight:  Good  Psychomotor Activity:  Normal  Concentration:  Concentration: Good and Attention Span: Good  Recall:  Good  Fund of Knowledge: Good  Language: Good  Akathisia:  No  Handed:  Right  AIMS (if indicated): not done  Assets:  Communication Skills Desire for Improvement  ADL's:  Intact  Cognition: WNL  Sleep:  Good   Screenings: AIMS    Flowsheet Row [facility]  AIMS Total Score 0      AUDIT    Flowsheet Row [facility]  Alcohol Use Disorder Identification Test Final Score (AUDIT) 0      GAD-7    Flowsheet Row Office Visit from 11/09/2022 in [facility]  Total GAD-7 Score 9      PHQ2-9    Flowsheet Row Office Visit from 11/09/2022 in [facility] Nutrition from 08/26/2021 in [facility] Total Score 2 0  PHQ-9 Total Score 6 --      Flowsheet Row ED to Hosp-Admission (Discharged) from 02/11/2023 in [facility] Admission (Discharged) from 09/01/2022 in [facility] Pre-Admission Testing 60 from 12/25/2021 in [facility] PRE [facility] TEST[facility]  C-SSRS RISK CATEGORY No Risk No Risk No Risk        Assessment and Plan:  Edgar Huynh is a 67 y.o. year old male with a history of depression, anxiety, Complex motor tics, type I diabetes, history of right pontine stroke,  TBI, hypertension, hyperlipidemia,   OSA on CPAP, OA, eosinophilic esophagitis, who presents for follow up  appointment for below.    1. MDD (major depressive disorder), recurrent, in partial remission (HCC) 2. Panic disorder Acute stressors include: loss of his mother, recent admission for DKA Other stressors include: Takes care of his wife, who uses a walker. Retirement (he describes satisfaction in this)    History: anxiety for many years. 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   Exam is notable for brighter, calm affect, and he denies any significant mood symptoms since uptitration of BuSpar.  Will continue Lexapro to target depression and anxiety,  and BuSpar to target anxiety  3. Insomnia, unspecified type - on CPAP machine Overall improving since being on trazodone.  He was advised to try lower dose if possible to mitigate risk of drowsiness during the day.  # r/o polypharmacy  He is on gabapentin, clonazepam, and trazodone, and all of these medication can contribute to drowsiness during the day.  He was advised to contact his neurology to get guidance about gabapentin and clonazepam.     # benzodiazepine dependence - on clonazepam 0.5 mg BID since 20's Unchanged. The medication is prescribed by another provider. He is advised this medication to be tapered off in the future to avoid long-term risks.   Plan Continue lexapro 20 mg daily  Continue buspirone 30 mg twice a day  Continue Trazodone 25-50 mg at night as needed  for insomnia Next appointment: 1/29 at 1 20 for 20 mins, video - on gabapentin 300 mg TID for tic, ingrezza 80 mg daily for slurred speech - on GOLO, OTC   The patient demonstrates the following risk factors for suicide: Chronic risk factors for suicide include: psychiatric disorder of depression, anxiety . Acute risk factors for suicide include: loss (financial, interpersonal, professional). Protective factors for this patient include: coping skills and hope for the future. Considering these factors, the overall suicide risk at this point  appears to be low. Patient is appropriate for outpatient follow up.     Collaboration of Care: Collaboration of Care: Other reviewed notes in Epic  Patient/Guardian was advised Release of Information must be obtained prior to any record release in order to collaborate their care with an outside provider. Patient/Guardian was advised if they have not already done so to contact the registration department to sign all necessary forms in order for [place] to release information regarding their care.   Consent: Patient/Guardian gives verbal consent for treatment and assignment of benefits for services provided during this visit. Patient/Guardian expressed understanding and agreed to proceed.    Neysa Hotter, MD 07/20/2023, 11:29 AM

## 2023-07-18 NOTE — Telephone Encounter (Signed)
Could you contact him to see if the next appointment schedule works for him? I am not sure what happened. Thanks.

## 2023-07-20 ENCOUNTER — Encounter: Payer: Self-pay | Admitting: Psychiatry

## 2023-07-20 ENCOUNTER — Telehealth (INDEPENDENT_AMBULATORY_CARE_PROVIDER_SITE_OTHER): Payer: Medicare Other | Admitting: Psychiatry

## 2023-07-20 DIAGNOSIS — F3341 Major depressive disorder, recurrent, in partial remission: Secondary | ICD-10-CM | POA: Diagnosis not present

## 2023-07-20 DIAGNOSIS — G4700 Insomnia, unspecified: Secondary | ICD-10-CM

## 2023-07-20 DIAGNOSIS — F410 Panic disorder [episodic paroxysmal anxiety] without agoraphobia: Secondary | ICD-10-CM | POA: Diagnosis not present

## 2023-07-20 MED ORDER — BUSPIRONE HCL 30 MG PO TABS: 30.0000 mg | ORAL_TABLET | Freq: Two times a day (BID) | ORAL | 3 refills | Status: DC

## 2023-07-20 MED ORDER — TRAZODONE HCL 100 MG PO TABS: 100.0000 mg | ORAL_TABLET | Freq: Every day | ORAL | 3 refills | Status: DC

## 2023-08-01 ENCOUNTER — Other Ambulatory Visit: Payer: Self-pay | Admitting: Family Medicine

## 2023-08-01 DIAGNOSIS — R269 Unspecified abnormalities of gait and mobility: Secondary | ICD-10-CM

## 2023-08-01 DIAGNOSIS — R4781 Slurred speech: Secondary | ICD-10-CM

## 2023-08-01 DIAGNOSIS — R29898 Other symptoms and signs involving the musculoskeletal system: Secondary | ICD-10-CM

## 2023-08-01 DIAGNOSIS — R1313 Dysphagia, pharyngeal phase: Secondary | ICD-10-CM

## 2023-08-18 ENCOUNTER — Ambulatory Visit: Payer: Medicare Other | Attending: Family Medicine

## 2023-08-31 ENCOUNTER — Ambulatory Visit: Payer: Medicare Other | Attending: Neurology | Admitting: Speech Pathology

## 2023-08-31 ENCOUNTER — Ambulatory Visit: Payer: Medicare Other | Admitting: Physical Therapy

## 2023-08-31 DIAGNOSIS — M6281 Muscle weakness (generalized): Secondary | ICD-10-CM | POA: Diagnosis present

## 2023-08-31 DIAGNOSIS — R2689 Other abnormalities of gait and mobility: Secondary | ICD-10-CM | POA: Diagnosis present

## 2023-08-31 DIAGNOSIS — R41841 Cognitive communication deficit: Secondary | ICD-10-CM | POA: Diagnosis present

## 2023-08-31 DIAGNOSIS — R471 Dysarthria and anarthria: Secondary | ICD-10-CM | POA: Diagnosis present

## 2023-08-31 NOTE — Therapy (Signed)
OUTPATIENT PHYSICAL THERAPY NEURO EVALUATION   Patient Name: Edgar Huynh MRN: [id number] DOB:Dec 01, 1955, 68 y.o., male Today's Date: 08/31/2023   PCP: Edgar Corner, PA-C  REFERRING PROVIDER: Wilford Corner, PA-C   END OF SESSION:  PT End of Session - 08/31/23 1327     Visit Number 1    Number of Visits 24    Date for PT Re-Evaluation 11/23/23    PT Start Time 1400    PT Stop Time 1445    PT Time Calculation (min) 45 min    Equipment Utilized During Treatment Gait belt    Activity Tolerance Patient tolerated treatment well    Behavior During Therapy WFL for tasks assessed/performed             Past Medical History:  Diagnosis Date   Anxiety    COPD (chronic obstructive pulmonary disease) (HCC)    Depression    GERD (gastroesophageal reflux disease)    Glaucoma    History of colonic polyps    History of kidney stones    Hypercholesteremia    Hyperlipidemia    Hypertension    Neuropathy    Osteoarthritis of left knee    Sleep apnea    Stroke (HCC) 12/10/1996   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   TIA (transient ischemic attack)    Type 1 diabetes (HCC)    on insulin pump   Past Surgical History:  Procedure Laterality Date   CAROTID ARTERY ANGIOPLASTY Right 10/18/2004   CATARACT EXTRACTION, BILATERAL Bilateral    COLONOSCOPY     COLONOSCOPY WITH PROPOFOL N/A 08/25/2018   Procedure: COLONOSCOPY WITH PROPOFOL;  Surgeon: Edgar Jun, MD;  Location: [facility] ENDOSCOPY;  Service: Endoscopy;  Laterality: N/A;   CYSTOSCOPY     ESOPHAGOGASTRODUODENOSCOPY     ESOPHAGOGASTRODUODENOSCOPY N/A 09/01/2022   Procedure: ESOPHAGOGASTRODUODENOSCOPY (EGD);  Surgeon: Edgar Huynh, Edgar Nearing, MD;  Location: [facility] ENDOSCOPY;  Service: Gastroenterology;  Laterality: N/A;   ESOPHAGOGASTRODUODENOSCOPY (EGD) WITH PROPOFOL N/A 08/25/2018   Procedure: ESOPHAGOGASTRODUODENOSCOPY (EGD) WITH PROPOFOL;  Surgeon: Edgar Jun, MD;  Location: [facility]  ENDOSCOPY;  Service: Endoscopy;  Laterality: N/A;   EYE SURGERY     PARTIAL KNEE ARTHROPLASTY Left 01/05/2022   Procedure: UNICOMPARTMENTAL KNEE;  Surgeon: Edgar Flake, MD;  Location: [facility] ORS;  Service: Orthopedics;  Laterality: Left;   WISDOM TOOTH EXTRACTION     Patient Active Problem List   Diagnosis Date Noted   DKA (diabetic ketoacidosis) (HCC) 02/11/2023   Known medical problems 02/11/2023   Colitis 02/11/2023   SOB (shortness of breath) 04/09/2021   HLD (hyperlipidemia) 04/09/2021   HTN (hypertension) 04/09/2021   Stroke (HCC) 04/09/2021   Chest pain 04/09/2021   Depression with anxiety 04/09/2021   Anxiety disorder 09/23/2017   MDD (major depressive disorder), single episode, severe with psychotic features (HCC) 03/01/2017   Adjustment disorder with mixed anxiety and depressed mood 02/28/2017   Psychosis, affective (HCC) 02/28/2017   Diabetic retinopathy (HCC) 10/03/2012   GERD (gastroesophageal reflux disease) 04/04/2012   Diabetic neuropathy (HCC) 04/04/2012   DM (diabetes mellitus) type I, controlled, with peripheral vascular disorder (HCC) 02/04/2009   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  Rationale for Evaluation and Treatment: Rehabilitation  SUBJECTIVE:  SUBJECTIVE STATEMENT: Reports that for roughly a year, he has been treated for Myasthenia Gravis, with possibility of PD. report  Pt accompanied by: self  PERTINENT HISTORY:   From Neurologist "Edgar Huynh mentioned that he is taking his medications as prescribed. 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   Concern for myasthenia gravis vs. parkinsonism in patient presenting with multiple neurological symptoms of facial drooping, difficulty swallowing, decrease speech volume, stiffness and short gait, shortness of breath and coughing. Patient states the symptoms started in 04/2023. Patient denise fluctuating droopy eyelids, starting new medications around the time of onset of symptoms. Patient does have a positive family history of Parkinson's disease in father. Negative for acute stroke, per 06/2023 MRI"   PAIN:  Are you having pain? No and occasional knee pain with falls >6 months ago   PRECAUTIONS: None  RED FLAGS: None   WEIGHT BEARING RESTRICTIONS: No  FALLS: Has patient fallen in last 6 months? No and several near falls  poor corrections of anterior LOB   LIVING ENVIRONMENT: Lives with: lives with their spouse Lives in: House/apartment Stairs: Yes: Internal: 13 steps; on left going up and External: 2 steps; on left going up Has following equipment at home: None  PLOF: Independent, Independent with basic ADLs, Independent with gait, and Independent with transfers  PATIENT GOALS: improve groggy drowsy feeling. Improve endurance and strength.   OBJECTIVE:  Note: Objective measures were completed at Evaluation unless otherwise noted.  DIAGNOSTIC FINDINGS:   MRI 11/19  IMPRESSION: Scattered and confluent T2 hyperintense signal in the periventricular white matter, likely the sequela of moderate chronic small vessel ischemic disease. Remote lacunar infarcts in the pons and left caudate. No acute intracranial process. No evidence of acute or subacute infarct.  COGNITION: Overall cognitive status: Within functional limits for tasks assessed   SENSATION: WFL States increased sensitivity in BLE  COORDINATION: Dysdidodactic kinesia: WFL  Finger ot nose WFL  Ankle to knee: Mild decreased speed on the L and mild  under shooting.    EDEMA:  WFL   MUSCLE TONE:  Grossly WFL  POSTURE: rounded shoulders and forward head  LOWER EXTREMITY ROM:     WFL  LOWER EXTREMITY MMT:    MMT Right Eval Left Eval  Hip flexion 4- 4-  Hip extension    Hip abduction 4- 4-  Hip adduction 4 4  Hip internal rotation    Hip external rotation    Knee flexion 4 4  Knee extension 4+ 4+  Ankle dorsiflexion 4+ 4+  Ankle plantarflexion    Ankle inversion    Ankle eversion    (Blank rows = not tested)  BED MOBILITY:  Sit to supine Complete Independence Supine to sit Complete Independence  TRANSFERS: Assistive device utilized: None  Sit to stand: Complete Independence Stand to sit: Complete Independence Chair to chair: Complete Independence Floor:  TBD  RAMP:  Level of Assistance: Complete Independence Assistive device utilized: None Ramp Comments:   CURB:  Level of Assistance: Complete Independence Assistive device utilized: None Curb Comments:   STAIRS: Level of Assistance: Complete Independence Stair Negotiation Technique: Alternating Pattern  with No Rails Number of Stairs: 4  Height of Stairs: 6  Comments: WFL  GAIT: Gait pattern:   , step through pattern, decreased arm swing- Right, decreased arm swing- Left, decreased trunk rotation, and trunk flexed Distance walked: 133ft Assistive device utilized: None Level of assistance: Complete Independence Comments:  FUNCTIONAL TESTS:  5 times sit to stand: 11.51 Timed up and go (TUG): 8.25 6 minute walk test: TBD 10 meter walk test: 7.9 and 8.3  Berg Balance Scale: 49 Functional gait assessment: 25  PATIENT SURVEYS:  FOTO 53                                                                                                                              TREATMENT DATE: 08/31/2023  EVAL only   PATIENT EDUCATION: Education details: POC Person educated: Patient Education method: Explanation Education comprehension: verbalized  understanding  HOME EXERCISE PROGRAM: To be given at session 2.   GOALS: Goals reviewed with patient? Yes   SHORT TERM GOALS: Target date: 09/29/2023    Patient will be independent in home exercise program to improve strength/mobility for better functional independence with ADLs. Baseline: to be given at session 2.  Goal status: INITIAL   LONG TERM GOALS: Target date: 11/24/2023    Patient will increase FOTO score  by equal to or greater than  4 points  to demonstrate statistically significant improvement in mobility and quality of life.  Baseline: 53 Goal status: INITIAL  2.  Patient (> 6 years old) will improve SLS by < 15 seconds indicating an increased LE strength and improved balance. Baseline: 4 sec bil Goal status: INITIAL  3.  Patient will increase Berg Balance score by > 4 points to demonstrate decreased fall risk during functional activities Baseline: 49 Goal status: INITIAL  4.  Patient will increase 6 min walk test by 148ft  as to improve gait speed for better community ambulation and to reduce fall risk. Baseline: to be complete Goal status: INITIAL   6.  Patient will increase FGA score to >27 as to demonstrate reduced fall risk and improved dynamic gait balance for better safety with community/home ambulation.   Baseline: 25 Goal status: INITIAL   ASSESSMENT:  CLINICAL IMPRESSION: Patient is a 67 y.o. male  who was seen today for physical therapy evaluation and treatment for balance, endurance, decreased mobility, and weakness with recent diagnosis of MG vs PD. Pt demonstrates mild balance deficits through FGA and Berg. Pt states that endurance and activity tolerence as well as BLE strength are greatest limiting factors at this time. Will complete 6 min walk test and provide HEP at next visit. Given deficits found at eval. Pt will benefit from skilled to PT improve function, reduce fall risk, and return to PLOF.    OBJECTIVE IMPAIRMENTS: Abnormal gait,  cardiopulmonary status limiting activity, decreased activity tolerance, decreased balance, decreased cognition, decreased endurance, decreased mobility, increased fascial restrictions, and increased muscle spasms.   ACTIVITY LIMITATIONS: carrying, lifting, bending, stairs, transfers, and locomotion level  PARTICIPATION LIMITATIONS: community activity and yard work  PERSONAL FACTORS: Age, Fitness, and 1-2 comorbidities: CVA and PD vs myasthenia gravis   are also affecting patient's functional outcome.   REHAB POTENTIAL: Excellent  CLINICAL DECISION MAKING: Stable/uncomplicated  EVALUATION COMPLEXITY:  Low  PLAN:  PT FREQUENCY: 1-2x/week  PT DURATION: 12 weeks  PLANNED INTERVENTIONS: 97110-Therapeutic exercises, 97530- Therapeutic activity, [id number]- Neuromuscular re-education, 97535- Self Care, 16109- Manual therapy, [phone]- Gait training, [phone]- Splinting, Patient/Family education, Balance training, Stair training, Taping, Joint mobilization, Joint manipulation, DME instructions, Wheelchair mobility training, Cryotherapy, and Moist heat  PLAN FOR NEXT SESSION:   6 min walk test.  Balance and strengthening HEP.    Golden Pop, PT 08/31/2023, 1:31 PM

## 2023-08-31 NOTE — Therapy (Signed)
OUTPATIENT SPEECH LANGUAGE PATHOLOGY PARKINSON'S EVALUATION   Patient Name: Edgar Huynh MRN: [id number] DOB:1956-05-06, 68 y.o., male Today's Date: 09/05/2023  PCP: Wilford Corner REFERRING PROVIDER: Cristopher Peru, MD    End of Session - 09/05/23 1119     Visit Number 1    Number of Visits 25    Date for SLP Re-Evaluation 11/21/23    Authorization Type [employer]    Progress Note Due on Visit 10    SLP Start Time 1315    SLP Stop Time  1400    SLP Time Calculation (min) 45 min    Activity Tolerance Patient tolerated treatment well              Past Medical History:  Diagnosis Date   Anxiety    COPD (chronic obstructive pulmonary disease) (HCC)    Depression    GERD (gastroesophageal reflux disease)    Glaucoma    History of colonic polyps    History of kidney stones    Hypercholesteremia    Hyperlipidemia    Hypertension    Neuropathy    Osteoarthritis of left knee    Sleep apnea    Stroke (HCC) 12/10/1996   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   TIA (transient ischemic attack)    Type 1 diabetes (HCC)    on insulin pump   Past Surgical History:  Procedure Laterality Date   CAROTID ARTERY ANGIOPLASTY Right 10/18/2004   CATARACT EXTRACTION, BILATERAL Bilateral    COLONOSCOPY     COLONOSCOPY WITH PROPOFOL N/A 08/25/2018   Procedure: COLONOSCOPY WITH PROPOFOL;  Surgeon: Scot Jun, MD;  Location: [facility] ENDOSCOPY;  Service: Endoscopy;  Laterality: N/A;   CYSTOSCOPY     ESOPHAGOGASTRODUODENOSCOPY     ESOPHAGOGASTRODUODENOSCOPY N/A 09/01/2022   Procedure: ESOPHAGOGASTRODUODENOSCOPY (EGD);  Surgeon: Tol[place], Boykin Nearing, MD;  Location: [facility] ENDOSCOPY;  Service: Gastroenterology;  Laterality: N/A;   ESOPHAGOGASTRODUODENOSCOPY (EGD) WITH PROPOFOL N/A 08/25/2018   Procedure: ESOPHAGOGASTRODUODENOSCOPY (EGD) WITH PROPOFOL;  Surgeon: Scot Jun, MD;  Location: [facility] ENDOSCOPY;  Service: Endoscopy;  Laterality: N/A;   EYE  SURGERY     PARTIAL KNEE ARTHROPLASTY Left 01/05/2022   Procedure: UNICOMPARTMENTAL KNEE;  Surgeon: Christena Flake, MD;  Location: [facility] ORS;  Service: Orthopedics;  Laterality: Left;   WISDOM TOOTH EXTRACTION     Patient Active Problem List   Diagnosis Date Noted   DKA (diabetic ketoacidosis) (HCC) 02/11/2023   Known medical problems 02/11/2023   Colitis 02/11/2023   SOB (shortness of breath) 04/09/2021   HLD (hyperlipidemia) 04/09/2021   HTN (hypertension) 04/09/2021   Stroke (HCC) 04/09/2021   Chest pain 04/09/2021   Depression with anxiety 04/09/2021   Anxiety disorder 09/23/2017   MDD (major depressive disorder), single episode, severe with psychotic features (HCC) 03/01/2017   Adjustment disorder with mixed anxiety and depressed mood 02/28/2017   Psychosis, affective (HCC) 02/28/2017   Diabetic retinopathy (HCC) 10/03/2012   GERD (gastroesophageal reflux disease) 04/04/2012   Diabetic neuropathy (HCC) 04/04/2012   DM (diabetes mellitus) type I, controlled, with peripheral vascular disorder (HCC) 02/04/2009   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  Rationale for Evaluation and Treatment Rehabilitation  SUBJECTIVE:   SUBJECTIVE STATEMENT: Pt pleasant, flat affect noted Pt accompanied by: self  PERTINENT HISTORY: Pt is a 68 year old male with initial concern for Myasthenia Gravis (symptoms initially observed in 04/2023) but labs were negative. Current working diagnosis of Parkinson's Disease with concern for facial drooping, difficulty swallowing, hypophonia, and family history of Parkinson's disease. Pt also experiences severe depression as well as complex motor tics d/t suspicion for longstanding tic disorder with component  of tardive dyskinesia in setting of neuroleptics (Ability, risperidone).   DIAGNOSTIC FINDINGS:   Modified Barium Swallow Study 06/02/2018 "Mild pharyngeal dysphagia characterized by delayed pharyngeal swallow initiation, reduced pharyngeal pressure generation, mild pharyngeal residue, transient laryngeal penetration (nectar-thick and thin liquids), and aspiration X1." Of note, report documents "pt compliant of weak voice" recommend ENT follow up  MR Brain w wo contrast 07/05/2023 - No acute intracranial process. No evidence of acute or subacute  infarct.   Single fiber EMG with [facility] - scheduled 10/12/2023   PAIN:  Are you having pain? No  FALLS: Has patient fallen in last 6 months?  See PT evaluation for details  LIVING ENVIRONMENT: Lives with: lives with their spouse Lives in: House/apartment  PLOF:  Level of assistance: Independent with ADLs, Independent with IADLs Employment: On disability  PATIENT GOALS    to improve speech and swallow function  OBJECTIVE:  COGNITIVE COMMUNICATION Overall cognitive status: No family/caregiver present to determine baseline cognitive functioning to be completed in following sessions   MOTOR SPEECH: Overall motor speech: impaired Level of impairment: Word Respiration: clavicular breathing Phonation: breathy, hoarse, and low vocal intensity Resonance: WFL Articulation: Impaired: word Intelligibility: Intelligibility reduced Motor planning: Impaired: aware and consistent Effective technique: slow rate, increased vocal intensity, and over articulate  ORAL MOTOR EXAMINATION Facial : WFL Lingual: WFL Velum: WFL Mandible: WFL Cough: WFL Voice: Hoarse, Breathy, Weak   OBJECTIVE VOICE ASSESSMENT: Sustained "ah" maximum phonation time: 6 seconds Sustained "ah" loudness average: 69 dB Average fundamental frequency during sustained "ah":121 Hz   (2 SD below average of  145 Hz +/- 23 for gender)  Oral reading (passage) loudness average:  68 dB Oral reading loudness range: 20 dB Conversational pitch average: 120 Hz Conversational pitch range: 20 Hz Conversational loudness average: 68 dB Conversational loudness range: 20 dB Voice quality: hoarse, breathy, low vocal intensity, and vocal fatigue  Patient and/or family report difficulty swallowing which warrants further evaluation  PATIENT REPORTED OUTCOME MEASURES (PROM): Pt to be completed in following sessions  TODAY'S TREATMENT:  N/A   PATIENT EDUCATION: Education details: results of this assessment, ST POC, plan to schedule a Modified Barium Swallow Study Person educated: Patient Education method: Explanation Education comprehension: needs further education   HOME EXERCISE PROGRAM: N/A   GOALS: Goals reviewed with patient? Yes  SHORT TERM GOALS: Target date: 10 sessions  With Rare Min A, patient will demo HEP for motor speech accurately in 5/7 opportunities.  Baseline: Goal status: INITIAL  2.  To determine optimal resistance levels for Respiratory Muscle Training (RMT) for improving increase hyolaryngeal elevation and strengthen cough for airway clearance, patient will participate in evaluation (and re-assessment as needed) of maximum expiratory pressure (MEP). Baseline:  Goal status: INITIAL  3.  With Min A, patient will complete 3 sets of 10 repetitions with EMST set at TBD cmH2O with self-reported effortful of 7 out of 10. Baseline:  Goal status: INITIAL  4.  With Min A, patient will improve speech intelligibility for  phrase by controlling rate of speech, over-articulation, and increased loudness to achieve 75% intelligibility.  Baseline:  Goal status: INITIAL  LONG TERM GOALS: Target date: 11/21/2023  With Supervision A, patient will participate in 15 minutes conversation, maintaining average loudness of 75 dB and loud, good quality voice.   Baseline:  Goal status: INITIAL  2.  Patient will report improved communication effectiveness as  measured by Communicative Effectiveness Survey. Baseline:  Goal status: INITIAL  3.  Patient will participate in objective swallowing evaluation (MBSS) to identify safest diet recommendation as well as therapeutic targets.  Baseline:  Goal status: INITIAL  4.  Patient will improve perception of swallowing as indicated by an improvement in EAT-10 score by 12 weeks from initial swallowing therapy session.  Baseline:  Goal status: INITIAL   ASSESSMENT:  CLINICAL IMPRESSION: Patient is a 68 year old male y.o.  who was seen today for Parkinson's Evaluation. Pt presents with moderate hypokinetic dysarthria that is c/b reduce articulation, volume, fast rate of speech, breathy hoarse vocal quality as well as report of s/s of potential aspiration with all PO consumption.   OBJECTIVE IMPAIRMENTS include expressive language, dysarthria, voice disorder, and dysphagia. These impairments are limiting patient from managing medications, managing appointments, managing finances, household responsibilities, ADLs/IADLs, effectively communicating at home and in community, and safety when swallowing. Factors affecting potential to achieve goals and functional outcome are co-morbidities, medical prognosis, and severity of impairments. Patient will benefit from skilled SLP services to address above impairments and improve overall function.  REHAB POTENTIAL: Good  PLAN: SLP FREQUENCY: 2x/week  SLP DURATION: 12 weeks  PLANNED INTERVENTIONS: Aspiration precaution training, Pharyngeal strengthening exercises, Diet toleration management , Trials of upgraded texture/liquids, Oral motor exercises, Functional tasks, Multimodal communication approach, SLP instruction and feedback, Compensatory strategies, and Patient/family education     [facility] [address] [place], [place], [address] Phone: [phone]   Fax:  [phone]

## 2023-09-02 ENCOUNTER — Other Ambulatory Visit: Payer: Self-pay | Admitting: Student

## 2023-09-02 DIAGNOSIS — M542 Cervicalgia: Secondary | ICD-10-CM

## 2023-09-05 ENCOUNTER — Ambulatory Visit: Payer: Medicare Other | Admitting: Physical Therapy

## 2023-09-05 DIAGNOSIS — M6281 Muscle weakness (generalized): Secondary | ICD-10-CM

## 2023-09-05 DIAGNOSIS — R2689 Other abnormalities of gait and mobility: Secondary | ICD-10-CM

## 2023-09-05 DIAGNOSIS — R471 Dysarthria and anarthria: Secondary | ICD-10-CM | POA: Diagnosis not present

## 2023-09-05 NOTE — Therapy (Signed)
OUTPATIENT PHYSICAL THERAPY NEURO EVALUATION   Patient Name: Edgar Huynh MRN: [id number] DOB:12-07-1955, 68 y.o., male Today's Date: 09/05/2023   PCP: Wilford Corner, PA-C  REFERRING PROVIDER: Wilford Corner, PA-C   END OF SESSION:  PT End of Session - 09/05/23 1056     Visit Number 2    Number of Visits 24    Date for PT Re-Evaluation 11/23/23    PT Start Time 1102    PT Stop Time 1145    PT Time Calculation (min) 43 min    Equipment Utilized During Treatment Gait belt    Activity Tolerance Patient tolerated treatment well    Behavior During Therapy WFL for tasks assessed/performed             Past Medical History:  Diagnosis Date   Anxiety    COPD (chronic obstructive pulmonary disease) (HCC)    Depression    GERD (gastroesophageal reflux disease)    Glaucoma    History of colonic polyps    History of kidney stones    Hypercholesteremia    Hyperlipidemia    Hypertension    Neuropathy    Osteoarthritis of left knee    Sleep apnea    Stroke (HCC) 12/10/1996   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   TIA (transient ischemic attack)    Type 1 diabetes (HCC)    on insulin pump   Past Surgical History:  Procedure Laterality Date   CAROTID ARTERY ANGIOPLASTY Right 10/18/2004   CATARACT EXTRACTION, BILATERAL Bilateral    COLONOSCOPY     COLONOSCOPY WITH PROPOFOL N/A 08/25/2018   Procedure: COLONOSCOPY WITH PROPOFOL;  Surgeon: Scot Jun, MD;  Location: [facility] ENDOSCOPY;  Service: Endoscopy;  Laterality: N/A;   CYSTOSCOPY     ESOPHAGOGASTRODUODENOSCOPY     ESOPHAGOGASTRODUODENOSCOPY N/A 09/01/2022   Procedure: ESOPHAGOGASTRODUODENOSCOPY (EGD);  Surgeon: Toledo, Boykin Nearing, MD;  Location: [facility] ENDOSCOPY;  Service: Gastroenterology;  Laterality: N/A;   ESOPHAGOGASTRODUODENOSCOPY (EGD) WITH PROPOFOL N/A 08/25/2018   Procedure: ESOPHAGOGASTRODUODENOSCOPY (EGD) WITH PROPOFOL;  Surgeon: Scot Jun, MD;  Location: [facility]  ENDOSCOPY;  Service: Endoscopy;  Laterality: N/A;   EYE SURGERY     PARTIAL KNEE ARTHROPLASTY Left 01/05/2022   Procedure: UNICOMPARTMENTAL KNEE;  Surgeon: Christena Flake, MD;  Location: [facility] ORS;  Service: Orthopedics;  Laterality: Left;   WISDOM TOOTH EXTRACTION     Patient Active Problem List   Diagnosis Date Noted   DKA (diabetic ketoacidosis) (HCC) 02/11/2023   Known medical problems 02/11/2023   Colitis 02/11/2023   SOB (shortness of breath) 04/09/2021   HLD (hyperlipidemia) 04/09/2021   HTN (hypertension) 04/09/2021   Stroke (HCC) 04/09/2021   Chest pain 04/09/2021   Depression with anxiety 04/09/2021   Anxiety disorder 09/23/2017   MDD (major depressive disorder), single episode, severe with psychotic features (HCC) 03/01/2017   Adjustment disorder with mixed anxiety and depressed mood 02/28/2017   Psychosis, affective (HCC) 02/28/2017   Diabetic retinopathy (HCC) 10/03/2012   GERD (gastroesophageal reflux disease) 04/04/2012   Diabetic neuropathy (HCC) 04/04/2012   DM (diabetes mellitus) type I, controlled, with peripheral vascular disorder (HCC) 02/04/2009   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 No significant updates. No pain, no falls, trips or stumbles since last PT visit.   Pt accompanied by: self  PERTINENT HISTORY:   From Neurologist "Edgar Huynh mentioned that he is taking his medications as prescribed.  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   Concern for myasthenia gravis vs. parkinsonism in patient presenting with multiple neurological symptoms of facial drooping, difficulty swallowing, decrease speech volume, stiffness and short gait, shortness of breath and coughing. Patient states the symptoms started in 04/2023. Patient denise fluctuating droopy eyelids, starting new medications around the time of onset of symptoms. Patient does have a positive family history of Parkinson's disease in father. Negative for acute stroke, per 06/2023 MRI"   PAIN:  Are you having pain? No and occasional knee pain with falls >6 months ago   PRECAUTIONS: None  RED FLAGS: None   WEIGHT BEARING RESTRICTIONS: No  FALLS: Has patient fallen in last 6 months? No and several near falls  poor corrections of anterior LOB   LIVING ENVIRONMENT: Lives with: lives with their spouse Lives in: House/apartment Stairs: Yes: Internal: 13 steps; on left going up and External: 2 steps; on left going up Has following equipment at home: None  PLOF: Independent, Independent with basic ADLs, Independent with gait, and Independent with transfers  PATIENT GOALS: improve groggy drowsy feeling. Improve endurance and strength.   OBJECTIVE:  Note: Objective measures were completed at Evaluation unless otherwise noted.  DIAGNOSTIC FINDINGS:   MRI 11/19  IMPRESSION: Scattered and confluent T2 hyperintense signal in the periventricular white matter, likely the sequela of moderate chronic small vessel ischemic disease. Remote lacunar infarcts in the pons and left caudate. No acute intracranial process. No evidence of acute or subacute infarct.  COGNITION: Overall cognitive status: Within functional limits for tasks assessed   SENSATION: WFL States increased sensitivity in BLE  COORDINATION: Dysdidodactic kinesia: WFL  Finger ot nose WFL  Ankle to knee:  Mild decreased speed on the L and mild under shooting.    EDEMA:  WFL   MUSCLE TONE:  Grossly WFL  POSTURE: rounded shoulders and forward head  LOWER EXTREMITY ROM:     WFL  LOWER EXTREMITY MMT:    MMT Right Eval Left Eval  Hip flexion 4- 4-  Hip extension    Hip abduction 4- 4-  Hip adduction 4 4  Hip internal rotation    Hip external rotation    Knee flexion 4 4  Knee extension 4+ 4+  Ankle dorsiflexion 4+ 4+  Ankle plantarflexion    Ankle inversion    Ankle eversion    (Blank rows = not tested)  BED MOBILITY:  Sit to supine Complete Independence Supine to sit Complete Independence  TRANSFERS: Assistive device utilized: None  Sit to stand: Complete Independence Stand to sit: Complete Independence Chair to chair: Complete Independence Floor:  TBD  RAMP:  Level of Assistance: Complete Independence Assistive device utilized: None Ramp Comments:   CURB:  Level of Assistance: Complete Independence Assistive device utilized: None Curb Comments:   STAIRS: Level of Assistance: Complete Independence Stair Negotiation Technique: Alternating Pattern  with No Rails Number of Stairs: 4  Height of Stairs: 6  Comments: WFL  GAIT: Gait pattern:   , step through pattern, decreased arm swing- Right, decreased arm swing- Left, decreased trunk rotation, and trunk flexed Distance walked: 185ft Assistive device utilized: None Level of assistance: Complete Independence  Comments:   FUNCTIONAL TESTS:  5 times sit to stand: 11.51 Timed up and go (TUG): 8.25 6 minute walk test: 646ft 10 meter walk test: 7.9 and 8.3  Berg Balance Scale: 49 Functional gait assessment: 25  PATIENT SURVEYS:  FOTO 53                                                                                                                              TREATMENT DATE: 08/31/2023  6 Min Walk Test:  Instructed patient to ambulate as quickly and as safely as possible for 6 minutes using LRAD.  Patient was allowed to take standing rest breaks without stopping the test, but if the patient required a sitting rest break the clock would be stopped and the test would be over.  Results: 630 feet in 3:18min  using no AD with supervision assist. Noted to start to shuffle/drage feet for the last ~149ft. Results indicate that the patient has reduced endurance with ambulation compared to age matched norms.  Age Matched Norms: 52-69 yo M: 15 F: 33, 86-79 yo M: 27 F: 471, 52-89 yo M: 417 F: 392 MDC: 58.21 meters (190.98 feet) or 50 meters (ANPTA Core Set of Outcome Measures for Adults with Neurologic Conditions, 2018)  Forward single leg step over cane x 10 bil no UE support  Lateral single leg step over cane x 10 bil no UE support Rates difficulty 5/10  Foot tap on 6 inch step x 20 bil with BUE support  Step up/down x 5 bil with BUE support   Sit<>stand 10x 3 with no UE support. Pt reports that 10 is his limit on each bout.  Mild SOB following last set. SpO2 assessed:  HEP review. Performed x 1 bout for each exercises.   Exercises - Sit to Stand  - 1 x daily - 2-3 x weekly - 2 sets - 10 reps - Standing Tandem Balance with Counter Support  - 1 x daily - 2-3 x weekly - 4 sets - 10 reps - 15 hold - Single Leg Stance with Support  - 1 x daily - 2-3 x weekly - 4 sets - 10 reps - 10 hold - Standing Hip Abduction with Counter Support  - 1 x daily - 2-3 x weekly - 2 sets - 10 reps - Standing March with Counter Support  - 1 x daily - 2-3 x weekly - 2 sets - 10 reps  PATIENT EDUCATION: Education details: POC, HEP  Person educated: Patient Education method: Explanation Education comprehension: verbalized understanding  HOME EXERCISE PROGRAM: Access Code: [email or web address] URL: [email or web address] Date: 09/05/2023 Prepared by: Grier Rocher  Exercises - Sit to Stand  - 1 x daily - 2-3 x weekly - 2 sets - 10 reps - Standing Tandem Balance with Counter Support  - 1 x daily - 2-3 x  weekly - 4 sets - 10 reps - 15 hold - Single Leg Stance with Support  - 1 x  daily - 2-3 x weekly - 4 sets - 10 reps - 10 hold - Standing Hip Abduction with Counter Support  - 1 x daily - 2-3 x weekly - 2 sets - 10 reps - Standing March with Counter Support  - 1 x daily - 2-3 x weekly - 2 sets - 10 reps  GOALS: Goals reviewed with patient? Yes   SHORT TERM GOALS: Target date: 09/29/2023    Patient will be independent in home exercise program to improve strength/mobility for better functional independence with ADLs. Baseline: to be given at session 2.  Goal status: INITIAL   LONG TERM GOALS: Target date: 11/24/2023    Patient will increase FOTO score  by equal to or greater than  4 points  to demonstrate statistically significant improvement in mobility and quality of life.  Baseline: 53 Goal status: INITIAL  2.  Patient (> 52 years old) will improve SLS by < 15 seconds indicating an increased LE strength and improved balance. Baseline: 4 sec bil Goal status: INITIAL  3.  Patient will increase Berg Balance score by > 4 points to demonstrate decreased fall risk during functional activities Baseline: 49 Goal status: INITIAL  4.  Patient will increase 6 min walk test by 135ft  as to improve gait speed for better community ambulation and to reduce fall risk. Baseline: 655ft for 3:18min  Goal status: INITIAL   6.  Patient will increase FGA score to >27 as to demonstrate reduced fall risk and improved dynamic gait balance for better safety with community/home ambulation.   Baseline: 25 Goal status: INITIAL   ASSESSMENT:  CLINICAL IMPRESSION: Patient is a 68 y.o. male  who was seen today for physical therapy  treatment for balance, endurance, decreased mobility, and weakness with recent diagnosis of MG vs PD.6 min walk test completed revealing significant;y reduced community mobility with walk limited to 691ft at 3:63min. HEP initiated for BLE strength, endurance, and balance as  listed above. Pt rated difficulty 5/10 throughout session.  Pt will benefit from skilled to PT improve function, reduce fall risk, and return to PLOF.    OBJECTIVE IMPAIRMENTS: Abnormal gait, cardiopulmonary status limiting activity, decreased activity tolerance, decreased balance, decreased cognition, decreased endurance, decreased mobility, increased fascial restrictions, and increased muscle spasms.   ACTIVITY LIMITATIONS: carrying, lifting, bending, stairs, transfers, and locomotion level  PARTICIPATION LIMITATIONS: community activity and yard work  PERSONAL FACTORS: Age, Fitness, and 1-2 comorbidities: CVA and PD vs myasthenia gravis   are also affecting patient's functional outcome.   REHAB POTENTIAL: Excellent  CLINICAL DECISION MAKING: Stable/uncomplicated  EVALUATION COMPLEXITY: Low  PLAN:  PT FREQUENCY: 1-2x/week  PT DURATION: 12 weeks  PLANNED INTERVENTIONS: 97110-Therapeutic exercises, 97530- Therapeutic activity, [id number]- Neuromuscular re-education, 97535- Self Care, 29562- Manual therapy, [phone]- Gait training, [phone]- Splinting, Patient/Family education, Balance training, Stair training, Taping, Joint mobilization, Joint manipulation, DME instructions, Wheelchair mobility training, Cryotherapy, and Moist heat  PLAN FOR NEXT SESSION:   Improved activity tolerance and BLE strengthening  High level balance training.   Golden Pop, PT 09/05/2023, 10:57 AM

## 2023-09-07 ENCOUNTER — Ambulatory Visit: Payer: Medicare Other | Admitting: Speech Pathology

## 2023-09-07 ENCOUNTER — Ambulatory Visit: Payer: Medicare Other | Admitting: Physical Therapy

## 2023-09-07 DIAGNOSIS — R2689 Other abnormalities of gait and mobility: Secondary | ICD-10-CM

## 2023-09-07 DIAGNOSIS — M6281 Muscle weakness (generalized): Secondary | ICD-10-CM

## 2023-09-07 DIAGNOSIS — R471 Dysarthria and anarthria: Secondary | ICD-10-CM

## 2023-09-07 NOTE — Therapy (Signed)
OUTPATIENT SPEECH LANGUAGE PATHOLOGY  PARKINSON'S TREATMENT NOTE   Patient Name: Edgar Huynh MRN: [id number] DOB:September 28, 1955, 68 y.o., male Today's Date: 09/07/2023  PCP: Wilford Corner REFERRING PROVIDER: Cristopher Peru, MD   End of Session - 09/07/23 1308     Visit Number 2    Number of Visits 25    Date for SLP Re-Evaluation 11/21/23    Authorization Type [employer]    Progress Note Due on Visit 10    SLP Start Time 1315    SLP Stop Time  1400    SLP Time Calculation (min) 45 min    Activity Tolerance Patient tolerated treatment well              Past Medical History:  Diagnosis Date   Anxiety    COPD (chronic obstructive pulmonary disease) (HCC)    Depression    GERD (gastroesophageal reflux disease)    Glaucoma    History of colonic polyps    History of kidney stones    Hypercholesteremia    Hyperlipidemia    Hypertension    Neuropathy    Osteoarthritis of left knee    Sleep apnea    Stroke (HCC) 12/10/1996   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   TIA (transient ischemic attack)    Type 1 diabetes (HCC)    on insulin pump   Past Surgical History:  Procedure Laterality Date   CAROTID ARTERY ANGIOPLASTY Right 10/18/2004   CATARACT EXTRACTION, BILATERAL Bilateral    COLONOSCOPY     COLONOSCOPY WITH PROPOFOL N/A 08/25/2018   Procedure: COLONOSCOPY WITH PROPOFOL;  Surgeon: Scot Jun, MD;  Location[facility] ENDOSCOPY;  Service: Endoscopy;  Laterality: N/A;   CYSTOSCOPY     ESOPHAGOGASTRODUODENOSCOPY     ESOPHAGOGASTRODUODENOSCOPY N/A 09/01/2022   Procedure: ESOPHAGOGASTRODUODENOSCOPY (EGD);  Surgeon: Toledo, Boykin Nearing, MD;  Location: [facility] ENDOSCOPY;  Service: Gastroenterology;  Laterality: N/A;   ESOPHAGOGASTRODUODENOSCOPY (EGD) WITH PROPOFOL N/A 08/25/2018   Procedure: ESOPHAGOGASTRODUODENOSCOPY (EGD) WITH PROPOFOL;  Surgeon: Scot Jun, MD;  Location: [facility] ENDOSCOPY;  Service: Endoscopy;  Laterality: N/A;    EYE SURGERY     PARTIAL KNEE ARTHROPLASTY Left 01/05/2022   Procedure: UNICOMPARTMENTAL KNEE;  Surgeon: Christena Flake, MD;  Location: [facility] ORS;  Service: Orthopedics;  Laterality: Left;   WISDOM TOOTH EXTRACTION     Patient Active Problem List   Diagnosis Date Noted   DKA (diabetic ketoacidosis) (HCC) 02/11/2023   Known medical problems 02/11/2023   Colitis 02/11/2023   SOB (shortness of breath) 04/09/2021   HLD (hyperlipidemia) 04/09/2021   HTN (hypertension) 04/09/2021   Stroke (HCC) 04/09/2021   Chest pain 04/09/2021   Depression with anxiety 04/09/2021   Anxiety disorder 09/23/2017   MDD (major depressive disorder), single episode, severe with psychotic features (HCC) 03/01/2017   Adjustment disorder with mixed anxiety and depressed mood 02/28/2017   Psychosis, affective (HCC) 02/28/2017   Diabetic retinopathy (HCC) 10/03/2012   GERD (gastroesophageal reflux disease) 04/04/2012   Diabetic neuropathy (HCC) 04/04/2012   DM (diabetes mellitus) type I, controlled, with peripheral vascular disorder (HCC) 02/04/2009   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 Current working diagnosis of Parkinson's Disease with concern for facial drooping, difficulty swallowing, hypophonia, and family history of Parkinson's disease. Pt also experiences severe depression as well as complex motor tics d/t suspicion for longstanding tic disorder with component of tardive dyskinesia in setting of neuroleptics (Ability, risperidone).    DIAGNOSTIC FINDINGS:   Modified Barium Swallow Study 06/02/2018 "Mild pharyngeal dysphagia characterized by delayed pharyngeal swallow initiation, reduced pharyngeal pressure generation, mild pharyngeal residue, transient laryngeal penetration (nectar-thick and thin liquids), and aspiration X1." Of note, report documents "pt compliant of weak voice" recommend ENT follow up  MR Brain w wo contrast 07/05/2023 - No acute intracranial process. No evidence of acute or subacute  infarct.   Single fiber EMG with [facility] - scheduled 10/12/2023   PAIN:  Are you having pain? No  FALLS: Has patient fallen in last 6 months?  See PT evaluation for details  LIVING ENVIRONMENT: Lives with: lives with their spouse Lives in: House/apartment  PLOF:  Level of assistance: Independent with ADLs, Independent with IADLs Employment: On disability  PATIENT GOALS    to improve speech and swallow function  SUBJECTIVE STATEMENT: Pt pleasant, flat affect noted Pt accompanied by: self  OBJECTIVE:   TODAY'S TREATMENT:  Skilled treatment session focused on pt's dysphagia and dysarthria. SLP facilitated session by providing the following interventions:  PATIENT REPORTED OUTCOME MEASURES (PROM):  EATING ASSESSMENT TOOL (EAT-10)   The patient was asked to rate to what extent the following statements are problematic on a scale of 0-4. 0 = No problem; 4 = Severe problem. A total score of 3 or higher is considered abnormal.  1.) My swallowing problem has caused me to lose weight. 2 2.) My swallowing problem interferes with my ability to go out for meals. 2 3.) Swallowing liquids takes extra effort. 2 4.) Swallowing solids takes extra effort. 3  5.) Swallowing pills takes extra effort. 1 6.) Swallowing is painful. 0 7.) The pleasure of eating is affected by my swallowing. 2 8.) When I swallow food sticks in my throat. 3 9.) I cough when I eat. 3 10.) Swallowing is stressful. 2   TOTAL SCORE: 20   The  Communication Effectiveness Survey is a patient-reported outcome measure in which the patient rates their own effectiveness in different communication situations. A higher score indicates greater effectiveness.   Pt's self-rating was 15/32.   Having a conversation with a family member or friends at home. 2 Participating in conversation with strangers in a quiet place. 2 Conversing with a familiar person over the telephone. 2 Conversing with a stranger over the telephone. 2 Being part of a conversation in a noisy environment (social gathering). 2 Speaking to a friend when you are emotionally upset or you are angry. 2 Having a conversation while traveling in a car. 2 Having a conversation with someone at a distance (across a room). 1- Not at all effective   Maximal multimodal instruction and assistance provided to increase abdominal breathing d/t tendency for clavicular breathing resulting in decreased respiratory support for phonation  Moderate assistance for coordination of respiration and phonation during phrase level practice- subjectively pt stated that he struggles with feeling as if he is yelling - at rest, pt continues with possible tardive dyskinesia of the mouth as observed with tongue twisting at rest, open mouth posture and some baseline lateral distortion of /s/     PATIENT EDUCATION: Education details: see above; schedule  a Modified Barium Swallow Study Person educated: Patient Education method: Explanation Education comprehension: needs further education   HOME EXERCISE PROGRAM: N/A   GOALS: Goals reviewed with patient? Yes  SHORT TERM GOALS: Target date: 10 sessions  With Rare Min A, patient will demo HEP for motor speech accurately in 5/7 opportunities.  Baseline: Goal status: INITIAL  2.  To determine optimal resistance levels for Respiratory Muscle Training (RMT) for improving increase hyolaryngeal elevation and strengthen cough for airway clearance, patient  will participate in evaluation (and re-assessment as needed) of maximum expiratory pressure (MEP). Baseline:  Goal status: INITIAL  3.  With Min A, patient will complete 3 sets of 10 repetitions with EMST set at TBD cmH2O with self-reported effortful of 7 out of 10. Baseline:  Goal status: INITIAL  4.  With Min A, patient will improve speech intelligibility for  phrase by controlling rate of speech, over-articulation, and increased loudness to achieve 75% intelligibility.  Baseline:  Goal status: INITIAL   LONG TERM GOALS: Target date: 11/21/2023  With Supervision A, patient will participate in 15 minutes conversation, maintaining average loudness of 75 dB and loud, good quality voice.   Baseline:  Goal status: INITIAL  2.  Patient will report improved communication effectiveness as measured by Communicative Effectiveness Survey. Baseline:  Goal status: INITIAL  3.  Patient will participate in objective swallowing evaluation (MBSS) to identify safest diet recommendation as well as therapeutic targets.  Baseline:  Goal status: INITIAL  4.  Patient will improve perception of swallowing as indicated by an improvement in EAT-10 score by 12 weeks from initial swallowing therapy session.  Baseline:  Goal status: INITIAL   ASSESSMENT:  CLINICAL IMPRESSION: Patient is a 68 year old male y.o.  who was seen today for a speech language treatment d/t Parkinson's Disease. Pt presents with moderate hypokinetic dysarthria that is c/b reduce articulation, volume, fast rate of speech, breathy hoarse vocal quality as well as report of s/s of potential aspiration with all PO consumption.   Modified Barium Swallow study scheduled for Friday 09/16/2023 at 1pm. See the above treatment note for details.   OBJECTIVE IMPAIRMENTS include expressive language, dysarthria, voice disorder, and dysphagia. These impairments are limiting patient from managing medications, managing appointments, managing  finances, household responsibilities, ADLs/IADLs, effectively communicating at home and in community, and safety when swallowing. Factors affecting potential to achieve goals and functional outcome are co-morbidities, medical prognosis, and severity of impairments. Patient will benefit from skilled SLP services to address above impairments and improve overall function.  REHAB POTENTIAL: Good  PLAN: SLP FREQUENCY: 2x/week  SLP DURATION: 12 weeks  PLANNED INTERVENTIONS: Aspiration precaution training, Pharyngeal strengthening exercises, Diet toleration management , Trials of upgraded texture/liquids, Oral motor exercises, Functional tasks, Multimodal communication approach, SLP instruction and feedback, Compensatory strategies, and Patient/family education   Malikai Gut B. Dreama Saa, M.S., CCC-SLP, Tree surgeon Certified Brain Injury Specialist [facility]  [facility] Rehabilitation Services Office [phone] Ascom [phone] Fax [phone]

## 2023-09-07 NOTE — Therapy (Signed)
OUTPATIENT PHYSICAL THERAPY NEURO EVALUATION   Patient Name: Edgar Huynh MRN: [id number] DOB:[birth year], 68 y.o., male Today's Date: 09/07/2023   PCP: Wilford Corner, PA-C  REFERRING PROVIDER: Wilford Corner, PA-C   END OF SESSION:  PT End of Session - 09/07/23 1402     Visit Number 3    Number of Visits 24    Date for PT Re-Evaluation 11/23/23    PT Start Time 1401    PT Stop Time 1445    PT Time Calculation (min) 44 min    Equipment Utilized During Treatment Gait belt    Activity Tolerance Patient tolerated treatment well    Behavior During Therapy WFL for tasks assessed/performed             Past Medical History:  Diagnosis Date   Anxiety    COPD (chronic obstructive pulmonary disease) (HCC)    Depression    GERD (gastroesophageal reflux disease)    Glaucoma    History of colonic polyps    History of kidney stones    Hypercholesteremia    Hyperlipidemia    Hypertension    Neuropathy    Osteoarthritis of left knee    Sleep apnea    Stroke (HCC) 12/10/1996   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   TIA (transient ischemic attack)    Type 1 diabetes (HCC)    on insulin pump   Past Surgical History:  Procedure Laterality Date   CAROTID ARTERY ANGIOPLASTY Right 10/18/2004   CATARACT EXTRACTION, BILATERAL Bilateral    COLONOSCOPY     COLONOSCOPY WITH PROPOFOL N/A 08/25/2018   Procedure: COLONOSCOPY WITH PROPOFOL;  Surgeon: Scot Jun, MD;  Location: [facility] ENDOSCOPY;  Service: Endoscopy;  Laterality: N/A;   CYSTOSCOPY     ESOPHAGOGASTRODUODENOSCOPY     ESOPHAGOGASTRODUODENOSCOPY N/A 09/01/2022   Procedure: ESOPHAGOGASTRODUODENOSCOPY (EGD);  Surgeon: Toledo, Boykin Nearing, MD;  Location: [facility] ENDOSCOPY;  Service: Gastroenterology;  Laterality: N/A;   ESOPHAGOGASTRODUODENOSCOPY (EGD) WITH PROPOFOL N/A 08/25/2018   Procedure: ESOPHAGOGASTRODUODENOSCOPY (EGD) WITH PROPOFOL;  Surgeon: Scot Jun, MD;  Location: [facility]  ENDOSCOPY;  Service: Endoscopy;  Laterality: N/A;   EYE SURGERY     PARTIAL KNEE ARTHROPLASTY Left 01/05/2022   Procedure: UNICOMPARTMENTAL KNEE;  Surgeon: Christena Flake, MD;  Location: [facility] ORS;  Service: Orthopedics;  Laterality: Left;   WISDOM TOOTH EXTRACTION     Patient Active Problem List   Diagnosis Date Noted   DKA (diabetic ketoacidosis) (HCC) 02/11/2023   Known medical problems 02/11/2023   Colitis 02/11/2023   SOB (shortness of breath) 04/09/2021   HLD (hyperlipidemia) 04/09/2021   HTN (hypertension) 04/09/2021   Stroke (HCC) 04/09/2021   Chest pain 04/09/2021   Depression with anxiety 04/09/2021   Anxiety disorder 09/23/2017   MDD (major depressive disorder), single episode, severe with psychotic features (HCC) 03/01/2017   Adjustment disorder with mixed anxiety and depressed mood 02/28/2017   Psychosis, affective (HCC) 02/28/2017   Diabetic retinopathy (HCC) 10/03/2012   GERD (gastroesophageal reflux disease) 04/04/2012   Diabetic neuropathy (HCC) 04/04/2012   DM (diabetes mellitus) type I, controlled, with peripheral vascular disorder (HCC) 02/04/2009   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 Reports that fatigue is a side affect of medication, that is common. No pain reported at s  Pt accompanied by: self  PERTINENT HISTORY:   From Neurologist "Mr. Alleman mentioned that he is taking his medications as  prescribed. 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   Concern for myasthenia gravis vs. parkinsonism in patient presenting with multiple neurological symptoms of facial drooping, difficulty swallowing, decrease speech volume, stiffness and short gait, shortness of breath and coughing. Patient states the symptoms started in 04/2023. Patient denise fluctuating droopy eyelids, starting new medications around the time of onset of symptoms. Patient does have a positive family history of Parkinson's disease in father. Negative for acute stroke, per 06/2023 MRI"   PAIN:  Are you having pain? No and occasional knee pain with falls >6 months ago   PRECAUTIONS: None  RED FLAGS: None   WEIGHT BEARING RESTRICTIONS: No  FALLS: Has patient fallen in last 6 months? No and several near falls  poor corrections of anterior LOB   LIVING ENVIRONMENT: Lives with: lives with their spouse Lives in: House/apartment Stairs: Yes: Internal: 13 steps; on left going up and External: 2 steps; on left going up Has following equipment at home: None  PLOF: Independent, Independent with basic ADLs, Independent with gait, and Independent with transfers  PATIENT GOALS: improve groggy drowsy feeling. Improve endurance and strength.   OBJECTIVE:  Note: Objective measures were completed at Evaluation unless otherwise noted.  DIAGNOSTIC FINDINGS:   MRI 11/19  IMPRESSION: Scattered and confluent T2 hyperintense signal in the periventricular white matter, likely the sequela of moderate chronic small vessel ischemic disease. Remote lacunar infarcts in the pons and left caudate. No acute intracranial process. No evidence of acute or subacute infarct.  COGNITION: Overall cognitive status: Within functional limits for tasks assessed   SENSATION: WFL States increased sensitivity in BLE  COORDINATION: Dysdidodactic kinesia: WFL  Finger ot nose WFL  Ankle  to knee: Mild decreased speed on the L and mild under shooting.    EDEMA:  WFL   MUSCLE TONE:  Grossly WFL  POSTURE: rounded shoulders and forward head  LOWER EXTREMITY ROM:     WFL  LOWER EXTREMITY MMT:    MMT Right Eval Left Eval  Hip flexion 4- 4-  Hip extension    Hip abduction 4- 4-  Hip adduction 4 4  Hip internal rotation    Hip external rotation    Knee flexion 4 4  Knee extension 4+ 4+  Ankle dorsiflexion 4+ 4+  Ankle plantarflexion    Ankle inversion    Ankle eversion    (Blank rows = not tested)  BED MOBILITY:  Sit to supine Complete Independence Supine to sit Complete Independence  TRANSFERS: Assistive device utilized: None  Sit to stand: Complete Independence Stand to sit: Complete Independence Chair to chair: Complete Independence Floor:  TBD  RAMP:  Level of Assistance: Complete Independence Assistive device utilized: None Ramp Comments:   CURB:  Level of Assistance: Complete Independence Assistive device utilized: None Curb Comments:   STAIRS: Level of Assistance: Complete Independence Stair Negotiation Technique: Alternating Pattern  with No Rails Number of Stairs: 4  Height of Stairs: 6  Comments: WFL  GAIT: Gait pattern:   , step through pattern, decreased arm swing- Right, decreased arm swing- Left, decreased trunk rotation, and trunk flexed Distance walked: 167ft Assistive device utilized: None Level of assistance:  Complete Independence Comments:   FUNCTIONAL TESTS:  5 times sit to stand: 11.51 Timed up and go (TUG): 8.25 6 minute walk test: 629ft 10 meter walk test: 7.9 and 8.3  Berg Balance Scale: 49 Functional gait assessment: 25  PATIENT SURVEYS:  FOTO 53                                                                                                                              TREATMENT DATE: 09/07/2023  Octane. Level 3, seat 5, x 5 min, cues   -Foot taps on 6 inch step 2 x 15 with therapeutic rest break  between bouts.  -Seated forward reach to shoe then erect sitting with shoulder horizontal abduction x 10  -Sit<>stand with UE swing into extension from flexion - 4 square stepping 2 x5 CW and 2 x 5 CCW  - stepping over cane with BUE forward reach  2 x 10 bil with therapeutic rest break .  - gait without resistance 2 x 46ft with therapeutic rest break between bouts.   Pt required intermittent therapeutic rest breaks with hydration break after 4 square stepping task.  Cues for adequate heel contact and step length with fatigue.      PATIENT EDUCATION: Education details: POC, HEP  Person educated: Patient Education method: Explanation Education comprehension: verbalized understanding  HOME EXERCISE PROGRAM: Access Code: [email or web address] URL: [email or web address] Date: 09/05/2023 Prepared by: Grier Rocher  Exercises - Sit to Stand  - 1 x daily - 2-3 x weekly - 2 sets - 10 reps - Standing Tandem Balance with Counter Support  - 1 x daily - 2-3 x weekly - 4 sets - 10 reps - 15 hold - Single Leg Stance with Support  - 1 x daily - 2-3 x weekly - 4 sets - 10 reps - 10 hold - Standing Hip Abduction with Counter Support  - 1 x daily - 2-3 x weekly - 2 sets - 10 reps - Standing March with Counter Support  - 1 x daily - 2-3 x weekly - 2 sets - 10 reps  GOALS: Goals reviewed with patient? Yes   SHORT TERM GOALS: Target date: 09/29/2023    Patient will be independent in home exercise program to improve strength/mobility for better functional independence with ADLs. Baseline: to be given at session 2.  Goal status: INITIAL   LONG TERM GOALS: Target date: 11/24/2023    Patient will increase FOTO score  by equal to or greater than  4 points  to demonstrate statistically significant improvement in mobility and quality of life.  Baseline: 53 Goal status: INITIAL  2.  Patient (> 70 years old) will improve SLS by < 15 seconds indicating an increased LE strength and improved  balance. Baseline: 4 sec bil Goal status: INITIAL  3.  Patient will increase Berg Balance score by > 4 points to demonstrate decreased fall risk during functional activities Baseline: 49 Goal status: INITIAL  4.  Patient  will increase 6 min walk test by 160ft  as to improve gait speed for better community ambulation and to reduce fall risk. Baseline: 644ft for 3:74min  Goal status: INITIAL   6.  Patient will increase FGA score to >27 as to demonstrate reduced fall risk and improved dynamic gait balance for better safety with community/home ambulation.   Baseline: 25 Goal status: INITIAL   ASSESSMENT:  CLINICAL IMPRESSION: Patient is a 68 y.o. male  who was seen today for physical therapy  treatment for balance, endurance, decreased mobility, and weakness with recent diagnosis of MG vs PD. PT treatment focused on improved activity tolerance with multiple bouts of gait training ~446ft as well as improved step length and height in hall directions, tolerated well, but required multiple prolonged therapeutic rest breaks. Pt will benefit from skilled to PT improve function, reduce fall risk, and return to PLOF.    OBJECTIVE IMPAIRMENTS: Abnormal gait, cardiopulmonary status limiting activity, decreased activity tolerance, decreased balance, decreased cognition, decreased endurance, decreased mobility, increased fascial restrictions, and increased muscle spasms.   ACTIVITY LIMITATIONS: carrying, lifting, bending, stairs, transfers, and locomotion level  PARTICIPATION LIMITATIONS: community activity and yard work  PERSONAL FACTORS: Age, Fitness, and 1-2 comorbidities: CVA and PD vs myasthenia gravis   are also affecting patient's functional outcome.   REHAB POTENTIAL: Excellent  CLINICAL DECISION MAKING: Stable/uncomplicated  EVALUATION COMPLEXITY: Low  PLAN:  PT FREQUENCY: 1-2x/week  PT DURATION: 12 weeks  PLANNED INTERVENTIONS: 97110-Therapeutic exercises, 97530- Therapeutic  activity, [id number]- Neuromuscular re-education, 97535- Self Care, 60109- Manual therapy, [phone]- Gait training, [phone]- Splinting, Patient/Family education, Balance training, Stair training, Taping, Joint mobilization, Joint manipulation, DME instructions, Wheelchair mobility training, Cryotherapy, and Moist heat  PLAN FOR NEXT SESSION:   Continued POC Improved activity tolerance and BLE strengthening  High level balance training.   Golden Pop, PT 09/07/2023, 2:06 PM

## 2023-09-09 ENCOUNTER — Ambulatory Visit
Admission: RE | Admit: 2023-09-09 | Discharge: 2023-09-09 | Disposition: A | Discharge status: Home / Self Care | Payer: Medicare Other | Source: Ambulatory Visit | Attending: Student | Admitting: Student

## 2023-09-09 DIAGNOSIS — M542 Cervicalgia: Secondary | ICD-10-CM

## 2023-09-12 ENCOUNTER — Ambulatory Visit: Payer: Medicare Other | Admitting: Physical Therapy

## 2023-09-12 ENCOUNTER — Ambulatory Visit: Payer: Medicare Other | Admitting: Speech Pathology

## 2023-09-12 DIAGNOSIS — R2689 Other abnormalities of gait and mobility: Secondary | ICD-10-CM

## 2023-09-12 DIAGNOSIS — R471 Dysarthria and anarthria: Secondary | ICD-10-CM

## 2023-09-12 DIAGNOSIS — M6281 Muscle weakness (generalized): Secondary | ICD-10-CM

## 2023-09-12 NOTE — Therapy (Signed)
OUTPATIENT SPEECH LANGUAGE PATHOLOGY  [facility]KINSON'S TREATMENT NOTE   Patient Name: Edgar Huynh MRN: [id number] DOB:05-Dec-1955, 68 y.o., male Today's Date: 09/12/2023  PCP: Wilford Corner REFERRING PROVIDER: Cristopher Peru, MD   End of Session - 09/12/23 1220     Visit Number 3    Number of Visits 25    Date for SLP Re-Evaluation 11/21/23    Authorization Type [employer]    Progress Note Due on Visit 10    SLP Start Time 1145    SLP Stop Time  1220    SLP Time Calculation (min) 35 min    Activity Tolerance Patient tolerated treatment well              Past Medical History:  Diagnosis Date   Anxiety    COPD (chronic obstructive pulmonary disease) (HCC)    Depression    GERD (gastroesophageal reflux disease)    Glaucoma    History of colonic polyps    History of kidney stones    Hypercholesteremia    Hyperlipidemia    Hypertension    Neuropathy    Osteoarthritis of left knee    Sleep apnea    Stroke (HCC) 12/10/1996   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   TIA (transient ischemic attack)    Type 1 diabetes (HCC)    on insulin pump   Past Surgical History:  Procedure Laterality Date   CAROTID ARTERY ANGIOPLASTY Right 10/18/2004   CATARACT EXTRACTION, BILATERAL Bilateral    COLONOSCOPY     COLONOSCOPY WITH PROPOFOL N/A 08/25/2018   Procedure: COLONOSCOPY WITH PROPOFOL;  Surgeon: Scot Jun, MD;  [facility] ENDOSCOPY;  Service: Endoscopy;  Laterality: N/A;   CYSTOSCOPY     ESOPHAGOGASTRODUODENOSCOPY     ESOPHAGOGASTRODUODENOSCOPY N/A 09/01/2022   Procedure: ESOPHAGOGASTRODUODENOSCOPY (EGD);  Surgeon: Toledo, Boykin Nearing, MD;  Location: [facility] ENDOSCOPY;  Service: Gastroenterology;  Laterality: N/A;   ESOPHAGOGASTRODUODENOSCOPY (EGD) WITH PROPOFOL N/A 08/25/2018   Procedure: ESOPHAGOGASTRODUODENOSCOPY (EGD) WITH PROPOFOL;  Surgeon: Scot Jun, MD;  Location: [facility] ENDOSCOPY;  Service: Endoscopy;  Laterality: N/A;    EYE SURGERY     PARTIAL KNEE ARTHROPLASTY Left 01/05/2022   Procedure: UNICOMPARTMENTAL KNEE;  Surgeon: Christena Flake, MD;  Location: [facility] ORS;  Service: Orthopedics;  Laterality: Left;   WISDOM TOOTH EXTRACTION     Patient Active Problem List   Diagnosis Date Noted   DKA (diabetic ketoacidosis) (HCC) 02/11/2023   Known medical problems 02/11/2023   Colitis 02/11/2023   SOB (shortness of breath) 04/09/2021   HLD (hyperlipidemia) 04/09/2021   HTN (hypertension) 04/09/2021   Stroke (HCC) 04/09/2021   Chest pain 04/09/2021   Depression with anxiety 04/09/2021   Anxiety disorder 09/23/2017   MDD (major depressive disorder), single episode, severe with psychotic features (HCC) 03/01/2017   Adjustment disorder with mixed anxiety and depressed mood 02/28/2017   Psychosis, affective (HCC) 02/28/2017   Diabetic retinopathy (HCC) 10/03/2012   GERD (gastroesophageal reflux disease) 04/04/2012   Diabetic neuropathy (HCC) 04/04/2012   DM (diabetes mellitus) type I, controlled, with peripheral vascular disorder (HCC) 02/04/2009   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 Current working diagnosis of Parkinson's Disease with concern for facial drooping, difficulty swallowing, hypophonia, and family history of Parkinson's disease. Pt also experiences severe depression as well as complex motor tics d/t suspicion for longstanding tic disorder with component of tardive dyskinesia in setting of neuroleptics (Ability, risperidone).    DIAGNOSTIC FINDINGS:   Modified Barium Swallow Study 06/02/2018 "Mild pharyngeal dysphagia characterized by delayed pharyngeal swallow initiation, reduced pharyngeal pressure generation, mild pharyngeal residue, transient laryngeal penetration (nectar-thick and thin liquids), and aspiration X1." Of note, report documents "pt compliant of weak voice" recommend ENT follow up  MR Brain w wo contrast 07/05/2023 - No acute intracranial process. No evidence of acute or subacute  infarct.   Single fiber EMG with [facility] - scheduled 10/12/2023   PAIN:  Are you having pain? No  FALLS: Has patient fallen in last 6 months?  See PT evaluation for details  LIVING ENVIRONMENT: Lives with: lives with their spouse Lives in: House/apartment  PLOF:  Level of assistance: Independent with ADLs, Independent with IADLs Employment: On disability  PATIENT GOALS    to improve speech and swallow function  SUBJECTIVE STATEMENT: Pt more conversant - "there were times when my wife struggled to understand me and I raised my voice, that helped" Pt accompanied by: self  OBJECTIVE:   TODAY'S TREATMENT:  Skilled treatment session focused on pt's dysphagia and dysarthria. SLP facilitated session by providing the following interventions:  Training provided in the Six SPEAK OUT! Exercises   WARM-UP   (using "May---Me---My---Moe---Moo") Smooth Connected Speech- Min A Every syllable with INTENT-  Min A Typically produced at 85-90 dB- Rare Min A to achieve 85 dB   AH Mouth opened wide- Supervision A Duration (not more than 10 seconds)-  Mod I to achieve 7 seconds Volume consistent throughout- Min A Clear vocal quality throughout- Min A Typically produced at 85-90 dB- Min A to achieve 85 dB   GLIDES Mouth opened wide- Supervision A Steady "ah," then glide up- Supervision A  Steady "ah," then glide down-  Supervision A  Clear vocal quality throughout- Supervision A  Typically produced at 85-90dB-  Supervision A to achieve 85 dB   COUNTING  Smooth and connected- Min A  Neither mono-pitch nor chanted- Min A  Projected up and forward- Min A  Every number exaggerated- Min A   Typically produced at 80-85 dB-  Min A  to achieve 83 dB   READING Be deliberate- Moderate A  Projected up and forward- Moderate A  Every single word with INTENT- Moderate A  Typically produced at 75-85 dB- Moderate A  to achieve 70 dB   CONVERSATION  Begin with structured tasks   Every single word with INTENT- Moderate A Maximal A         PATIENT EDUCATION: Education details: see above; schedule a Modified Barium Swallow Study Person educated: Patient Education method: Explanation Education comprehension: needs further education   HOME EXERCISE PROGRAM: Speak Out assignments   GOALS: Goals reviewed with patient? Yes  SHORT TERM GOALS: Target date: 10 sessions  With Rare Min A, patient will demo HEP for motor speech accurately in 5/7 opportunities.  Baseline: Goal status: INITIAL  2.  To determine optimal resistance levels for Respiratory Muscle Training (RMT) for improving increase hyolaryngeal elevation and strengthen cough for airway clearance, patient will participate in evaluation (and re-assessment as needed) of maximum expiratory pressure (MEP). Baseline:  Goal status: INITIAL  3.  With Min A, patient will complete 3 sets of 10 repetitions with EMST set at TBD cmH2O with self-reported effortful of 7 out of 10. Baseline:  Goal status: INITIAL  4.  With Min A, patient will improve speech intelligibility for  phrase by controlling rate of speech, over-articulation, and increased loudness to achieve 75% intelligibility.  Baseline:  Goal status: INITIAL   LONG TERM GOALS: Target date: 11/21/2023  With Supervision A, patient will participate in 15 minutes conversation, maintaining average loudness of 75 dB and loud, good quality voice.   Baseline:  Goal status: INITIAL  2.   Patient will report improved communication effectiveness as measured by Communicative Effectiveness Survey. Baseline:  Goal status: INITIAL  3.  Patient will participate in objective swallowing evaluation (MBSS) to identify safest diet recommendation as well as therapeutic targets.  Baseline:  Goal status: INITIAL  4.  Patient will improve perception of swallowing as indicated by an improvement in EAT-10 score by 12 weeks from initial swallowing therapy session.  Baseline:  Goal status: INITIAL   ASSESSMENT:  CLINICAL IMPRESSION: Patient is a 68 year old male y.o.  who was seen today for a speech language treatment d/t Parkinson's Disease. Pt presents with moderate hypokinetic dysarthria that is c/b reduce articulation, volume, fast rate of speech, breathy hoarse vocal quality as well as report of s/s of potential aspiration with all PO consumption.   Pt with great response to therapy activities, improved dysarthria noted as a result. Modified Barium Swallow study scheduled for Friday 09/16/2023 at 1pm. See the above treatment note for details.   OBJECTIVE IMPAIRMENTS include expressive language, dysarthria, voice disorder, and dysphagia. These impairments are limiting patient from managing medications, managing appointments, managing finances, household responsibilities, ADLs/IADLs, effectively communicating at home and in community, and safety when swallowing. Factors affecting potential to achieve goals and functional outcome are co-morbidities, medical prognosis, and severity of impairments. Patient will benefit from skilled SLP services to address above impairments and improve overall function.  REHAB POTENTIAL: Good  PLAN: SLP FREQUENCY: 2x/week  SLP DURATION: 12 weeks  PLANNED INTERVENTIONS: Aspiration precaution training, Pharyngeal strengthening exercises, Diet toleration management , Trials of upgraded texture/liquids, Oral motor exercises, Functional tasks, Multimodal  communication approach, SLP instruction and feedback, Compensatory strategies, and Patient/family education   Jazzlynn Rawe B. Dreama Saa, M.S., CCC-SLP, Tree surgeon Certified Brain Injury Specialist [facility]  [facility] Rehabilitation Services Office [phone] Ascom [phone] Fax [phone]

## 2023-09-12 NOTE — Therapy (Addendum)
OUTPATIENT PHYSICAL THERAPY NEURO treatment   Patient Name: Edgar Huynh MRN: [id number] DOB:[birth year], 68 y.o., male Today's Date: 09/12/2023   PCP: Wilford Corner, PA-C  REFERRING PROVIDER: Wilford Corner, PA-C   END OF SESSION:  PT End of Session - 09/12/23 1105     Visit Number 4    Number of Visits 24    Date for PT Re-Evaluation 11/23/23    PT Start Time 1105    PT Stop Time 1145    PT Time Calculation (min) 40 min    Equipment Utilized During Treatment Gait belt    Activity Tolerance Patient tolerated treatment well    Behavior During Therapy WFL for tasks assessed/performed             Past Medical History:  Diagnosis Date   Anxiety    COPD (chronic obstructive pulmonary disease) (HCC)    Depression    GERD (gastroesophageal reflux disease)    Glaucoma    History of colonic polyps    History of kidney stones    Hypercholesteremia    Hyperlipidemia    Hypertension    Neuropathy    Osteoarthritis of left knee    Sleep apnea    Stroke (HCC) 12/10/1996   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   TIA (transient ischemic attack)    Type 1 diabetes (HCC)    on insulin pump   Past Surgical History:  Procedure Laterality Date   CAROTID ARTERY ANGIOPLASTY Right 10/18/2004   CATARACT EXTRACTION, BILATERAL Bilateral    COLONOSCOPY     COLONOSCOPY WITH PROPOFOL N/A 08/25/2018   Procedure: COLONOSCOPY WITH PROPOFOL;  Surgeon: Scot Jun, MD;  Location: [facility] ENDOSCOPY;  Service: Endoscopy;  Laterality: N/A;   CYSTOSCOPY     ESOPHAGOGASTRODUODENOSCOPY     ESOPHAGOGASTRODUODENOSCOPY N/A 09/01/2022   Procedure: ESOPHAGOGASTRODUODENOSCOPY (EGD);  Surgeon: Toledo, Boykin Nearing, MD;  Location: [facility] ENDOSCOPY;  Service: Gastroenterology;  Laterality: N/A;   ESOPHAGOGASTRODUODENOSCOPY (EGD) WITH PROPOFOL N/A 08/25/2018   Procedure: ESOPHAGOGASTRODUODENOSCOPY (EGD) WITH PROPOFOL;  Surgeon: Scot Jun, MD;  Location: [facility]  ENDOSCOPY;  Service: Endoscopy;  Laterality: N/A;   EYE SURGERY     PARTIAL KNEE ARTHROPLASTY Left 01/05/2022   Procedure: UNICOMPARTMENTAL KNEE;  Surgeon: Christena Flake, MD;  Location: [facility] ORS;  Service: Orthopedics;  Laterality: Left;   WISDOM TOOTH EXTRACTION     Patient Active Problem List   Diagnosis Date Noted   DKA (diabetic ketoacidosis) (HCC) 02/11/2023   Known medical problems 02/11/2023   Colitis 02/11/2023   SOB (shortness of breath) 04/09/2021   HLD (hyperlipidemia) 04/09/2021   HTN (hypertension) 04/09/2021   Stroke (HCC) 04/09/2021   Chest pain 04/09/2021   Depression with anxiety 04/09/2021   Anxiety disorder 09/23/2017   MDD (major depressive disorder), single episode, severe with psychotic features (HCC) 03/01/2017   Adjustment disorder with mixed anxiety and depressed mood 02/28/2017   Psychosis, affective (HCC) 02/28/2017   Diabetic retinopathy (HCC) 10/03/2012   GERD (gastroesophageal reflux disease) 04/04/2012   Diabetic neuropathy (HCC) 04/04/2012   DM (diabetes mellitus) type I, controlled, with peripheral vascular disorder (HCC) 02/04/2009   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 Reports feeling better this morning. States that he had 5 hour energy prior to PT to improve energy. Does not take 5 hout energy often.   Pt accompanied by: self  PERTINENT HISTORY:   From Neurologist "Mr.  Macdonell mentioned that he is taking his medications as prescribed. 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   Concern for myasthenia gravis vs. parkinsonism in patient presenting with multiple neurological symptoms of facial drooping, difficulty swallowing, decrease speech volume, stiffness and short gait, shortness of breath and coughing. Patient states the symptoms started in 04/2023. Patient denise fluctuating droopy eyelids, starting new medications around the time of onset of symptoms. Patient does have a positive family history of Parkinson's disease in father. Negative for acute stroke, per 06/2023 MRI"   PAIN:  Are you having pain? No and occasional knee pain with falls >6 months ago   PRECAUTIONS: None  RED FLAGS: None   WEIGHT BEARING RESTRICTIONS: No  FALLS: Has patient fallen in last 6 months? No and several near falls  poor corrections of anterior LOB   LIVING ENVIRONMENT: Lives with: lives with their spouse Lives in: House/apartment Stairs: Yes: Internal: 13 steps; on left going up and External: 2 steps; on left going up Has following equipment at home: None  PLOF: Independent, Independent with basic ADLs, Independent with gait, and Independent with transfers  PATIENT GOALS: improve groggy drowsy feeling. Improve endurance and strength.   OBJECTIVE:  Note: Objective measures were completed at Evaluation unless otherwise noted.  DIAGNOSTIC FINDINGS:   MRI 11/19  IMPRESSION: Scattered and confluent T2 hyperintense signal in the periventricular white matter, likely the sequela of moderate chronic small vessel ischemic disease. Remote lacunar infarcts in the pons and left caudate. No acute intracranial process. No evidence of acute or subacute infarct.  COGNITION: Overall cognitive status: Within functional limits for tasks assessed   SENSATION: WFL States increased sensitivity in BLE   COORDINATION: Dysdidodactic kinesia: WFL  Finger ot nose WFL  Ankle to knee: Mild decreased speed on the L and mild under shooting.    EDEMA:  WFL   MUSCLE TONE:  Grossly WFL  POSTURE: rounded shoulders and forward head  LOWER EXTREMITY ROM:     WFL  LOWER EXTREMITY MMT:    MMT Right Eval Left Eval  Hip flexion 4- 4-  Hip extension    Hip abduction 4- 4-  Hip adduction 4 4  Hip internal rotation    Hip external rotation    Knee flexion 4 4  Knee extension 4+ 4+  Ankle dorsiflexion 4+ 4+  Ankle plantarflexion    Ankle inversion    Ankle eversion    (Blank rows = not tested)  BED MOBILITY:  Sit to supine Complete Independence Supine to sit Complete Independence  TRANSFERS: Assistive device utilized: None  Sit to stand: Complete Independence Stand to sit: Complete Independence Chair to chair: Complete Independence Floor:  TBD  RAMP:  Level of Assistance: Complete Independence Assistive device utilized: None Ramp Comments:   CURB:  Level of Assistance: Complete Independence Assistive device utilized: None Curb Comments:   STAIRS: Level of Assistance: Complete Independence Stair Negotiation Technique: Alternating Pattern  with No Rails Number of Stairs: 4  Height of Stairs: 6  Comments: WFL  GAIT: Gait pattern:   , step through pattern, decreased arm swing- Right, decreased arm swing- Left, decreased trunk rotation, and trunk flexed  Distance walked: 110ft Assistive device utilized: None Level of assistance: Complete Independence Comments:   FUNCTIONAL TESTS:  5 times sit to stand: 11.51 Timed up and go (TUG): 8.25 6 minute walk test: 640ft 10 meter walk test: 7.9 and 8.3  Berg Balance Scale: 49 Functional gait assessment: 25  PATIENT SURVEYS:  FOTO 53                                                                                                                              TREATMENT DATE: 09/12/2023   Gait without AD x418ft  -Foot  taps on 6 inch step 2 x 12 with therapeutic rest break between bouts.  -Seated trunk rotation to clap hands x 10 bil  -Sit<>stand with UE raise beyond shoulder height  -Side stepping x 4 bil x 42ft -Forward/revrse gait without AD 39ft x 4 each  -Obstacle course to weave through 8 cones, step up/down 6 inch stpe and over 2 half bolsters. Performed x 4 with cues for full avoidance of hedge hogs.  -Gait with 180deg turns x 5 for 142ft.  - lateral stepping with hip ER and shoulder opening x 10 bil   Pt required intermittent therapeutic rest breaks with hydration break after 4 square stepping task.  Cues for adequate heel contact and step length with fatigue.      PATIENT EDUCATION: Education details: POC, HEP  Person educated: Patient Education method: Explanation Education comprehension: verbalized understanding  HOME EXERCISE PROGRAM: Access Code: [email or web address] URL: [email or web address] Date: 09/05/2023 Prepared by: Grier Rocher  Exercises - Sit to Stand  - 1 x daily - 2-3 x weekly - 2 sets - 10 reps - Standing Tandem Balance with Counter Support  - 1 x daily - 2-3 x weekly - 4 sets - 10 reps - 15 hold - Single Leg Stance with Support  - 1 x daily - 2-3 x weekly - 4 sets - 10 reps - 10 hold - Standing Hip Abduction with Counter Support  - 1 x daily - 2-3 x weekly - 2 sets - 10 reps - Standing March with Counter Support  - 1 x daily - 2-3 x weekly - 2 sets - 10 reps  GOALS: Goals reviewed with patient? Yes   SHORT TERM GOALS: Target date: 09/29/2023    Patient will be independent in home exercise program to improve strength/mobility for better functional independence with ADLs. Baseline: to be given at session 2.  Goal status: INITIAL   LONG TERM GOALS: Target date: 11/24/2023    Patient will increase FOTO score  by equal to or greater than  4 points  to demonstrate statistically significant improvement in mobility and quality of life.  Baseline: 53 Goal  status: INITIAL  2.  Patient (> 43 years old) will improve SLS by < 15 seconds indicating an increased LE strength and improved balance. Baseline: 4 sec bil Goal status: INITIAL  3.  Patient will increase Berg Balance score  by > 4 points to demonstrate decreased fall risk during functional activities Baseline: 49 Goal status: INITIAL  4.  Patient will increase 6 min walk test by 181ft  as to improve gait speed for better community ambulation and to reduce fall risk. Baseline: 628ft for 3:61min  Goal status: INITIAL   6.  Patient will increase FGA score to >27 as to demonstrate reduced fall risk and improved dynamic gait balance for better safety with community/home ambulation.   Baseline: 25 Goal status: INITIAL   ASSESSMENT:  CLINICAL IMPRESSION: Patient is a 67 y.o. male  who was seen today for physical therapy  treatment for balance, endurance, decreased mobility, and weakness with recent diagnosis of MG vs PD. PT treatment focused on improved activity tolerance and large amplitude movements. Note to have no foot drag throughout session but required increased instruction of obstacle management. Pt will benefit from skilled to PT improve function, reduce fall risk, and return to PLOF.    OBJECTIVE IMPAIRMENTS: Abnormal gait, cardiopulmonary status limiting activity, decreased activity tolerance, decreased balance, decreased cognition, decreased endurance, decreased mobility, increased fascial restrictions, and increased muscle spasms.   ACTIVITY LIMITATIONS: carrying, lifting, bending, stairs, transfers, and locomotion level  PARTICIPATION LIMITATIONS: community activity and yard work  PERSONAL FACTORS: Age, Fitness, and 1-2 comorbidities: CVA and PD vs myasthenia gravis   are also affecting patient's functional outcome.   REHAB POTENTIAL: Excellent  CLINICAL DECISION MAKING: Stable/uncomplicated  EVALUATION COMPLEXITY: Low  PLAN:  PT FREQUENCY: 1-2x/week  PT DURATION:  12 weeks  PLANNED INTERVENTIONS: 97110-Therapeutic exercises, 97530- Therapeutic activity, [id number]- Neuromuscular re-education, 97535- Self Care, [id number]- Manual therapy, [phone]- Gait training, [phone]- Splinting, Patient/Family education, Balance training, Stair training, Taping, Joint mobilization, Joint manipulation, DME instructions, Wheelchair mobility training, Cryotherapy, and Moist heat  PLAN FOR NEXT SESSION:   Continued POC Improved activity tolerance and BLE strengthening  High level balance training.   Golden Pop, PT 09/12/2023, 11:48 AM

## 2023-09-13 NOTE — Progress Notes (Unsigned)
Virtual Visit via Video Note  I connected with Edgar Huynh on 09/14/23 at  1:20 PM EST by a video enabled telemedicine application and verified that I am speaking with the correct person using two identifiers.  Location: Patient: car Provider: office Persons participated in the visit- patient, provider    I discussed the limitations of evaluation and management by telemedicine and the availability of in person appointments. The patient expressed understanding and agreed to proceed.    I discussed the assessment and treatment plan with the patient. The patient was provided an opportunity to ask questions and all were answered. The patient agreed with the plan and demonstrated an understanding of the instructions.   The patient was advised to call back or seek an in-person evaluation if the symptoms worsen or if the condition fails to improve as anticipated.    Neysa Hotter, MD    [facility] MD/PA/NP OP Progress Note  09/14/2023 1:42 PM Edgar Huynh  MRN:  [id number]  Chief Complaint:  Chief Complaint  Patient presents with   Follow-up   HPI:  This is a follow-up appointment for depression, anxiety and insomnia.  He states that he is seeing PT and ST.  He is trying to work on his speech volume up.  He is looking forward to these appointments.  He goes to church, and reads Bible.  He had good Christmas with his family.  His mood has been good since being on the current medication.  He sleeps well with trazodone.  He has been taking clonazepam for some burning sensation in his leg.  He does not think it is gabapentin but clonazepam.  He tries not to take 3 gabapentin as it can make him feel drowsy.  He agrees to discuss this with his primary care.  He denies feeling depressed or anxiety.  He denies SI.  He agrees with the plan as outlined below.   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  He states that although they were not rich, his parents provided enough to him.    Substance use   Tobacco Alcohol Other substances/  Current denies denies denies  Past denies 12 pack on weekend, not since 2018 denies  Past Treatment             Visit Diagnosis:    ICD-10-CM   1. MDD (major depressive disorder), recurrent, in full remission (HCC)  F33.42     2. Panic disorder  F41.0     3. Insomnia, unspecified type  G47.00       Past Psychiatric History: Please see initial evaluation for full details. I have reviewed the history. No updates at this time.     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   TIA (transient ischemic attack)  Type 1 diabetes (HCC)    on insulin pump    Past Surgical History:  Procedure Laterality Date   CAROTID ARTERY ANGIOPLASTY Right 10/18/2004   CATARACT EXTRACTION, BILATERAL Bilateral    COLONOSCOPY     COLONOSCOPY WITH PROPOFOL N/A 08/25/2018   Procedure: COLONOSCOPY WITH PROPOFOL;  Surgeon: Scot Jun, MD;  Location: [facility] ENDOSCOPY;  Service: Endoscopy;  Laterality: N/A;   CYSTOSCOPY     ESOPHAGOGASTRODUODENOSCOPY     ESOPHAGOGASTRODUODENOSCOPY N/A 09/01/2022   Procedure: ESOPHAGOGASTRODUODENOSCOPY (EGD);  Surgeon: Toledo, Boykin Nearing, MD;  Location: [facility] ENDOSCOPY;  Service: Gastroenterology;  Laterality: N/A;   ESOPHAGOGASTRODUODENOSCOPY (EGD) WITH PROPOFOL N/A 08/25/2018    Procedure: ESOPHAGOGASTRODUODENOSCOPY (EGD) WITH PROPOFOL;  Surgeon: Scot Jun, MD;  Location: [facility] ENDOSCOPY;  Service: Endoscopy;  Laterality: N/A;   EYE SURGERY     PARTIAL KNEE ARTHROPLASTY Left 01/05/2022   Procedure: UNICOMPARTMENTAL KNEE;  Surgeon: Christena Flake, MD;  Location: [facility] ORS;  Service: Orthopedics;  Laterality: Left;   WISDOM TOOTH EXTRACTION      Family Psychiatric History: Please see initial evaluation for full details. I have reviewed the history. No updates at this time.     Family History:  Family History  Problem Relation Age of Onset   Parkinson's disease Father    Diabetes Mellitus II Brother     Social History:  Social History   Socioeconomic History   Marital status: Married    Spouse name: [name]   Number of children: 2   Years of education: Not on file   Highest education level: Some college, no degree  Occupational History   Not on file  Tobacco Use   Smoking status: Never   Smokeless tobacco: Never  Vaping Use   Vaping status: Never Used  Substance and Sexual Activity   Alcohol use: Not Currently    Comment: occasional   Drug use: Never   Sexual activity: Not on file    Comment: not asked if sexually active  Other Topics Concern   Not on file  Social History Narrative   Not on file   Social Drivers of Health   Financial Resource Strain: Medium Risk (09/12/2023)   Received from [facility]   Overall Financial Resource Strain (CARDIA)    Difficulty of Paying Living Expenses: Somewhat hard  Food Insecurity: No Food Insecurity (09/12/2023)   [facility] from [facility]   Hunger Vital Sign    Worried About Running Out of Food in the Last Year: Never true    Ran Out of Food in the Last Year: Never true  Transportation Needs: No Transportation Needs (09/12/2023)   Received from [facility] - [facility]    In the past 12 months, has lack of transportation kept you  from medical appointments or from getting medications?: No    Lack of Transportation (Non-Medical): No  Physical Activity: Not on file  Stress: Not on file  Social Connections: Not on file    Allergies:  Allergies  Allergen Reactions   Other Rash    Clonidine 0.3 mg patch (adhesive caused the irritation)    Metabolic Disorder Labs: Lab Results  Component Value Date   HGBA1C 7.2 (H) 04/09/2021   MPG 159.94 04/09/2021   MPG 151 03/02/2017   Lab Results  Component Value Date   PROLACTIN 25.6 (H) 03/02/2017   Lab Results  Component Value Date   CHOL 151 04/10/2021   TRIG 134 04/10/2021  HDL 46 04/10/2021   CHOLHDL 3.3 04/10/2021   VLDL 27 04/10/2021   LDLCALC 78 04/10/2021   LDLCALC 119 (H) 03/02/2017   Lab Results  Component Value Date   TSH 1.582 11/09/2022   TSH 0.937 04/10/2021    Therapeutic Level Labs: No results found for: "LITHIUM" No results found for: "VALPROATE" No results found for: "CBMZ"  Current Medications: Current Outpatient Medications  Medication Sig Dispense Refill   albuterol (VENTOLIN HFA) 108 (90 Base) MCG/ACT inhaler Inhale 2 puffs into the lungs every 8 (eight) hours as needed.     Alpha-Lipoic Acid 200 MG CAPS Take 200 mg by mouth daily.     amLODipine (NORVASC) 5 MG tablet Take 5 mg by mouth every evening.     atorvastatin (LIPITOR) 20 MG tablet Take 20 mg by mouth every evening.      budesonide-formoterol (SYMBICORT) 80-4.5 MCG/ACT inhaler Inhale 2 puffs into the lungs in the morning and at bedtime.     busPIRone (BUSPAR) 30 MG tablet Take 1 tablet (30 mg total) by mouth 2 (two) times daily. 60 tablet 3   CALCIUM PO Take 1,200 mg by mouth daily.     CLINPRO 5000 1.1 % PSTE SMARTSIG:Sparingly To Teeth Daily     clonazePAM (KLONOPIN) 0.5 MG tablet Take 0.5 mg by mouth 2 (two) times daily as needed for anxiety.     clopidogrel (PLAVIX) 75 MG tablet Take 75 mg by mouth daily.     Coenzyme Q10 (COQ-10) 100 MG CAPS Take 100 mg by mouth  daily.     escitalopram (LEXAPRO) 20 MG tablet Take 20 mg by mouth daily.     furosemide (LASIX) 20 MG tablet Take 20 mg by mouth daily.     gabapentin (NEURONTIN) 100 MG capsule Take 300 mg by mouth 3 (three) times daily.     glucose blood (CONTOUR NEXT TEST) test strip TEST BLOOD SUGAR 7 TIMES A DAY AS DIRECTED     HUMALOG 100 UNIT/ML injection Via Insulin Pump     hydrocortisone 2.5 % cream Apply 1 Application topically See admin instructions. Apply on Monday Wednesday and Friday to affected areas of face 30 g 11   ketoconazole (NIZORAL) 2 % cream Apply 1 Application topically See admin instructions. Apply to face Tues Thurs Sat 30 g 11   latanoprost (XALATAN) 0.005 % ophthalmic solution Place 1 drop into both eyes at bedtime.     Multiple Vitamin (MULTIVITAMIN WITH MINERALS) TABS tablet Take 1 tablet by mouth daily.     pantoprazole (PROTONIX) 40 MG tablet Take 40 mg by mouth daily.     Potassium 99 MG TABS Take 2 tablets by mouth daily.     pyridostigmine (MESTINON) 60 MG tablet Take 60 mg by mouth daily.     [START ON 12/03/2023] traZODone (DESYREL) 100 MG tablet Take 1 tablet (100 mg total) by mouth at bedtime. 30 tablet 3   valbenazine (INGREZZA) 80 MG capsule Take 80 mg by mouth daily.     No current facility-administered medications for this visit.     Musculoskeletal: Strength & Muscle Tone:  N/A Gait & Station:  N/A Patient leans: N/A  Psychiatric Specialty Exam: Review of Systems  Psychiatric/Behavioral: Negative.    All other systems reviewed and are negative.   There were no vitals taken for this visit.There is no height or weight on file to calculate BMI.  General Appearance: Well Groomed  Eye Contact:  Good  Speech:  Clear and Coherent  Volume:  Normal  Mood:   good  Affect:  Appropriate, Congruent, and Full Range  Thought Process:  Coherent  Orientation:  Full (Time, Place, and Person)  Thought Content: Logical   Suicidal Thoughts:  No  Homicidal Thoughts:   No  Memory:  Immediate;   Good  Judgement:  Good  Insight:  Good  Psychomotor Activity:  Normal  Concentration:  Concentration: Good and Attention Span: Good  Recall:  Good  Fund of Knowledge: Good  Language: Good  Akathisia:  No  Handed:  Right  AIMS (if indicated): not done  Assets:  Communication Skills Desire for Improvement  ADL's:  Intact  Cognition: WNL  Sleep:  Good   Screenings: AIMS    Flowsheet Row [facility]  AIMS Total Score 0      AUDIT    Flowsheet Row [facility]  Alcohol Use Disorder Identification Test Final Score (AUDIT) 0      GAD-7    Flowsheet Row Office Visit from 11/09/2022 in [facility]  Total GAD-7 Score 9      PHQ2-9    Flowsheet Row Office Visit [facility] 11/09/2022 in [facility] Nutrition from 08/26/2021 in [facility] Total Score 2 0  PHQ-9 Total Score 6 --      Flowsheet Row ED to Hosp-Admission (Discharged) from [facility]/11/2023 in [facility] Admission (Discharged) from 09/01/2022 in [facility] Pre-Admission Testing 60 from 12/25/2021 in [facility] PRE [facility]MISS[facility] TESTING  C-SSRS RISK CATEGORY No Risk No Risk No Risk        Assessment and Plan:  Edgar Huynh is a 68 y.o. year old male with a history of depression, anxiety, Complex motor tics, type I diabetes, history of right pontine stroke,  TBI, hypertension, hyperlipidemia,   OSA on CPAP, OA, eosinophilic esophagitis, who presents for follow up appointment for below.    1. MDD (major depressive disorder), recurrent, in full remission (HCC) 2. Panic disorder Acute stressors include: loss of his mother,  recent admission for DKA Other stressors include: Takes care of his wife, who uses a walker. Retirement (he describes satisfaction in this)    History: anxiety for many years. 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   He reports significant benefit from the current combination of medication and denies any significant mood symptoms.  Will continue Lexapro to target depression and anxiety.  Will continue BuSpar for anxiety given he reports significant benefit.   3. Insomnia, unspecified type - on CPAP machine  Stable.  Will continue current dose of trazodone as needed for insomnia.   # r/o polypharmacy  He is on gabapentin, clonazepam, and trazodone, and all of these medication can contribute to drowsiness during the day.  He was advised again to discuss with his primary care to hopefully taper down clonazepam.     # benzodiazepine dependence - on clonazepam 0.5 mg BID since 20's Unchanged. The medication is prescribed by another provider. He is advised this medication to be tapered off in the future to avoid long-term risks.   Plan Continue lexapro 20 mg daily  Continue buspirone 30 mg twice a day  Continue Trazodone 25-50 mg at night as needed for insomnia Next appointment: 4/23 at 1 40 for  20 mins, video - on gabapentin 300 mg TID for tic, ingrezza 80 mg daily for slurred speech - on clonazepam 0.5 mg twice a day - on GOLO, OTC   The patient demonstrates the following risk factors for suicide: Chronic risk factors for suicide include: psychiatric disorder of depression, anxiety . Acute risk factors for suicide include: loss (financial, interpersonal, professional). Protective factors for this patient include: coping skills and hope for the future. Considering these factors, the overall suicide risk at this point appears to be low. Patient is appropriate for outpatient follow up.   Collaboration of Care: Collaboration of Care: Other reviewed notes in  Epic  Patient/Guardian was advised Release of Information must be obtained prior to any record release in order to collaborate their care with an outside provider. Patient/Guardian was advised if they have not already done so to contact the registration department to sign all necessary forms in order for [place] to release information regarding their care.   Consent: Patient/Guardian gives verbal consent for treatment and assignment of benefits for services provided during this visit. Patient/Guardian expressed understanding and agreed to proceed.    Neysa Hotter, MD 09/14/2023, 1:42 PM

## 2023-09-14 ENCOUNTER — Encounter: Payer: Self-pay | Admitting: Psychiatry

## 2023-09-14 ENCOUNTER — Telehealth: Payer: Self-pay | Admitting: Psychiatry

## 2023-09-14 ENCOUNTER — Telehealth (INDEPENDENT_AMBULATORY_CARE_PROVIDER_SITE_OTHER): Payer: Medicare Other | Admitting: Psychiatry

## 2023-09-14 ENCOUNTER — Ambulatory Visit: Payer: Medicare Other

## 2023-09-14 ENCOUNTER — Ambulatory Visit: Payer: Medicare Other | Admitting: Speech Pathology

## 2023-09-14 DIAGNOSIS — G4700 Insomnia, unspecified: Secondary | ICD-10-CM | POA: Diagnosis not present

## 2023-09-14 DIAGNOSIS — R471 Dysarthria and anarthria: Secondary | ICD-10-CM

## 2023-09-14 DIAGNOSIS — R2689 Other abnormalities of gait and mobility: Secondary | ICD-10-CM

## 2023-09-14 DIAGNOSIS — F410 Panic disorder [episodic paroxysmal anxiety] without agoraphobia: Secondary | ICD-10-CM | POA: Diagnosis not present

## 2023-09-14 DIAGNOSIS — M6281 Muscle weakness (generalized): Secondary | ICD-10-CM

## 2023-09-14 DIAGNOSIS — F3342 Major depressive disorder, recurrent, in full remission: Secondary | ICD-10-CM

## 2023-09-14 MED ORDER — TRAZODONE HCL 100 MG PO TABS: 100.0000 mg | ORAL_TABLET | Freq: Every day | ORAL | 3 refills | Status: DC

## 2023-09-14 NOTE — Patient Instructions (Signed)
Continue lexapro 20 mg daily  Continue buspirone 30 mg twice a day  Continue Trazodone 25-50 mg at night as needed for insomnia Next appointment: 4/23 at 1 40

## 2023-09-14 NOTE — Therapy (Signed)
OUTPATIENT PHYSICAL THERAPY NEURO TREATMENT  Patient Name: Edgar Huynh CLASS MRN: [id number] DOB:[birth year], 68 y.o., male Today's Date: 09/15/2023   PCP: Wilford Corner, PA-C  REFERRING PROVIDER: Wilford Corner, PA-C   END OF SESSION:  PT End of Session - 09/14/23 1428     Visit Number 5    Number of Visits 24    Date for PT Re-Evaluation 11/23/23    PT Start Time 1448    PT Stop Time 1529    PT Time Calculation (min) 41 min    Equipment Utilized During Treatment Gait belt    Activity Tolerance Patient tolerated treatment well    Behavior During Therapy WFL for tasks assessed/performed             Past Medical History:  Diagnosis Date   Anxiety    COPD (chronic obstructive pulmonary disease) (HCC)    Depression    GERD (gastroesophageal reflux disease)    Glaucoma    History of colonic polyps    History of kidney stones    Hypercholesteremia    Hyperlipidemia    Hypertension    Neuropathy    Osteoarthritis of left knee    Sleep apnea    Stroke (HCC) 12/10/1996   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   TIA (transient ischemic attack)    Type 1 diabetes (HCC)    on insulin pump   Past Surgical History:  Procedure Laterality Date   CAROTID ARTERY ANGIOPLASTY Right 10/18/2004   CATARACT EXTRACTION, BILATERAL Bilateral    COLONOSCOPY     COLONOSCOPY WITH PROPOFOL N/A 08/25/2018   Procedure: COLONOSCOPY WITH PROPOFOL;  Surgeon: Scot Jun, MD;  Location[facility] ENDOSCOPY;  Service: Endoscopy;  Laterality: N/A;   CYSTOSCOPY     ESOPHAGOGASTRODUODENOSCOPY     ESOPHAGOGASTRODUODENOSCOPY N/A 09/01/2022   Procedure: ESOPHAGOGASTRODUODENOSCOPY (EGD);  Surgeon: Toledo, Boykin Nearing, MD;  Location: [facility] ENDOSCOPY;  Service: Gastroenterology;  Laterality: N/A;   ESOPHAGOGASTRODUODENOSCOPY (EGD) WITH PROPOFOL N/A 08/25/2018   Procedure: ESOPHAGOGASTRODUODENOSCOPY (EGD) WITH PROPOFOL;  Surgeon: Scot Jun, MD;  Location: [facility]  ENDOSCOPY;  Service: Endoscopy;  Laterality: N/A;   EYE SURGERY     PARTIAL KNEE ARTHROPLASTY Left 01/05/2022   Procedure: UNICOMPARTMENTAL KNEE;  Surgeon: Christena Flake, MD;  Location: [facility] ORS;  Service: Orthopedics;  Laterality: Left;   WISDOM TOOTH EXTRACTION     Patient Active Problem List   Diagnosis Date Noted   DKA (diabetic ketoacidosis) (HCC) 02/11/2023   Known medical problems 02/11/2023   Colitis 02/11/2023   SOB (shortness of breath) 04/09/2021   HLD (hyperlipidemia) 04/09/2021   HTN (hypertension) 04/09/2021   Stroke (HCC) 04/09/2021   Chest pain 04/09/2021   Depression with anxiety 04/09/2021   Anxiety disorder 09/23/2017   MDD (major depressive disorder), single episode, severe with psychotic features (HCC) 03/01/2017   Adjustment disorder with mixed anxiety and depressed mood 02/28/2017   Psychosis, affective (HCC) 02/28/2017   Diabetic retinopathy (HCC) 10/03/2012   GERD (gastroesophageal reflux disease) 04/04/2012   Diabetic neuropathy (HCC) 04/04/2012   DM (diabetes mellitus) type I, controlled, with peripheral vascular disorder (HCC) 02/04/2009   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    Pt accompanied by: self  PERTINENT HISTORY:   From Neurologist "[name] mentioned that he is taking his medications as prescribed. 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   Concern for myasthenia gravis vs. parkinsonism in patient presenting with multiple neurological symptoms of facial drooping, difficulty swallowing, decrease speech volume, stiffness and short gait, shortness of breath and coughing. Patient states the symptoms started in 04/2023. Patient denise fluctuating droopy eyelids, starting new medications around the time of onset of symptoms. Patient does have a positive family history of Parkinson's disease in father. Negative for acute stroke, per 06/2023 MRI"   PAIN:  Are you having pain? No and occasional knee pain with falls >6 months ago   PRECAUTIONS: None  RED FLAGS: None   WEIGHT BEARING RESTRICTIONS: No  FALLS: Has patient fallen in last 6 months? No and several near falls  poor corrections of anterior LOB   LIVING ENVIRONMENT: Lives with: lives with their spouse Lives in: House/apartment Stairs: Yes: Internal: 13 steps; on left going up and External: 2 steps; on left going up Has following equipment at home: None  PLOF: Independent, Independent with basic ADLs, Independent with gait, and Independent with transfers  PATIENT GOALS: improve groggy drowsy feeling. Improve endurance and strength.   OBJECTIVE:  Note: Objective measures were completed at Evaluation unless otherwise noted.  DIAGNOSTIC FINDINGS:   MRI 11/19  IMPRESSION: Scattered and confluent T2 hyperintense signal in the periventricular white matter, likely the sequela of moderate chronic small vessel ischemic disease. Remote lacunar infarcts in the pons and left caudate. No acute intracranial process. No evidence of acute or subacute infarct.  COGNITION: Overall cognitive status: Within functional limits for tasks assessed   SENSATION: WFL States increased sensitivity in BLE  COORDINATION: Dysdidodactic kinesia: WFL  Finger ot nose WFL  Ankle to knee: Mild decreased  speed on the L and mild under shooting.    EDEMA:  WFL   MUSCLE TONE:  Grossly WFL  POSTURE: rounded shoulders and forward head  LOWER EXTREMITY ROM:     WFL  LOWER EXTREMITY MMT:    MMT Right Eval Left Eval  Hip flexion 4- 4-  Hip extension    Hip abduction 4- 4-  Hip adduction 4  4  Hip internal rotation    Hip external rotation    Knee flexion 4 4  Knee extension 4+ 4+  Ankle dorsiflexion 4+ 4+  Ankle plantarflexion    Ankle inversion    Ankle eversion    (Blank rows = not tested)  BED MOBILITY:  Sit to supine Complete Independence Supine to sit Complete Independence  TRANSFERS: Assistive device utilized: None  Sit to stand: Complete Independence Stand to sit: Complete Independence Chair to chair: Complete Independence Floor:  TBD  RAMP:  Level of Assistance: Complete Independence Assistive device utilized: None Ramp Comments:   CURB:  Level of Assistance: Complete Independence Assistive device utilized: None Curb Comments:   STAIRS: Level of Assistance: Complete Independence Stair Negotiation Technique: Alternating Pattern  with No Rails Number of Stairs: 4  Height of Stairs: 6  Comments: WFL  GAIT: Gait pattern:   , step through pattern, decreased arm swing- Right, decreased arm swing- Left, decreased trunk rotation, and trunk flexed Distance walked: 183ft Assistive device utilized: None Level of assistance: Complete Independence Comments:   FUNCTIONAL TESTS:  5 times sit to stand: 11.51 Timed up and go (TUG): 8.25 6 minute walk test: 672ft 10 meter walk test: 7.9 and 8.3  Berg Balance Scale: 49 Functional gait assessment: 25  PATIENT SURVEYS:  FOTO 53                                                                                                                              TREATMENT DATE: 09/14/2023   NMR:  Step tap onto 6" block without UE support x 10 each  Step up onto 6" block without UE support x 10 ea LE Forward/retro  steps over 1/2 foam 3AW x 12 reps  Side stepping up/over 1/2 foam 3# AW x 12 reps   Sit to stand holding 5kg ball x 10 reps (patient reported as Tiring)  Dynamic forward stepping  3# AW and reach with UE- x 12 reps ea direction.   Gait without AD  and 3# Awx 314ft  (no Shuffling)   Dynamic activities at support bar: (dual task activity- arranging magnet letters into colored order)  in the following positions:  Staggered stand- switching at approx 30 sec x 2 each side Tandem stand- while arranging magnet letters  SLS - 5-10 sec x multiple attempts each LE           PATIENT EDUCATION: Education details: POC, HEP  Person educated: Patient Education method: Explanation Education comprehension: verbalized understanding  HOME EXERCISE PROGRAM: Access Code: [email or web address] URL: [email or web address] Date: 09/05/2023 Prepared by: Grier Rocher  Exercises - Sit to Stand  - 1 x daily - 2-3 x weekly - 2 sets - 10 reps - Standing Tandem Balance with Counter Support  - 1 x daily - 2-3 x weekly - 4 sets - 10 reps - 15 hold - Single Leg Stance with Support  - 1 x daily - 2-3 x weekly -  4 sets - 10 reps - 10 hold - Standing Hip Abduction with Counter Support  - 1 x daily - 2-3 x weekly - 2 sets - 10 reps - Standing March with Counter Support  - 1 x daily - 2-3 x weekly - 2 sets - 10 reps  GOALS: Goals reviewed with patient? Yes   SHORT TERM GOALS: Target date: 09/29/2023    Patient will be independent in home exercise program to improve strength/mobility for better functional independence with ADLs. Baseline: to be given at session 2.  Goal status: INITIAL   LONG TERM GOALS: Target date: 11/24/2023    Patient will increase FOTO score  by equal to or greater than  4 points  to demonstrate statistically significant improvement in mobility and quality of life.  Baseline: 53 Goal status: INITIAL  2.  Patient (> 56 years old) will improve SLS by < 15 seconds indicating  an increased LE strength and improved balance. Baseline: 4 sec bil Goal status: INITIAL  3.  Patient will increase Berg Balance score by > 4 points to demonstrate decreased fall risk during functional activities Baseline: 49 Goal status: INITIAL  4.  Patient will increase 6 min walk test by 149ft  as to improve gait speed for better community ambulation and to reduce fall risk. Baseline: 679ft for 3:60min  Goal status: INITIAL   6.  Patient will increase FGA score to >27 as to demonstrate reduced fall risk and improved dynamic gait balance for better safety with community/home ambulation.   Baseline: 25 Goal status: INITIAL   ASSESSMENT:  CLINICAL IMPRESSION: Treatment focused on dynamic balance activities focused on improving step height/foot clearance and single leg stance. He performed well overall- able to quickly adapt to task and demonstrate improvement in session with all activities. He is responsive to all verbal cues for proper form with activities and more challenged with narrowing foot positions.  Pt will benefit from skilled to PT improve function, reduce fall risk, and return to PLOF.    OBJECTIVE IMPAIRMENTS: Abnormal gait, cardiopulmonary status limiting activity, decreased activity tolerance, decreased balance, decreased cognition, decreased endurance, decreased mobility, increased fascial restrictions, and increased muscle spasms.   ACTIVITY LIMITATIONS: carrying, lifting, bending, stairs, transfers, and locomotion level  PARTICIPATION LIMITATIONS: community activity and yard work  PERSONAL FACTORS: Age, Fitness, and 1-2 comorbidities: CVA and PD vs myasthenia gravis   are also affecting patient's functional outcome.   REHAB POTENTIAL: Excellent  CLINICAL DECISION MAKING: Stable/uncomplicated  EVALUATION COMPLEXITY: Low  PLAN:  PT FREQUENCY: 1-2x/week  PT DURATION: 12 weeks  PLANNED INTERVENTIONS: 97110-Therapeutic exercises, 97530- Therapeutic activity,  [id number]- Neuromuscular re-education, 97535- Self Care, [id number]- Manual therapy, [phone]- Gait training, [phone]- Splinting, Patient/Family education, Balance training, Stair training, Taping, Joint mobilization, Joint manipulation, DME instructions, Wheelchair mobility training, Cryotherapy, and Moist heat  PLAN FOR NEXT SESSION:   Continued POC Improved activity tolerance and BLE strengthening  High level balance training.   Lenda Kelp, PT 09/15/2023, 4:52 PM

## 2023-09-14 NOTE — Therapy (Signed)
OUTPATIENT SPEECH LANGUAGE PATHOLOGY  PARKINSON'S TREATMENT NOTE   Patient Name: Edgar Huynh MRN: [id number] DOB:10/18/1955, 68 y.o., male Today's Date: 09/14/2023  PCP: Wilford Corner REFERRING PROVIDER: Cristopher Peru, MD   End of Session - 09/14/23 1401     Visit Number 4    Number of Visits 25    Date for SLP Re-Evaluation 11/21/23    Authorization Type [employer]    Authorization Time Period 09/05/2023 thur 11/28/2023    Authorization - Visit Number 4    Authorization - Number of Visits 24    Progress Note Due on Visit 10    SLP Start Time 1400    SLP Stop Time  1445    SLP Time Calculation (min) 45 min    Activity Tolerance Patient tolerated treatment well              Past Medical History:  Diagnosis Date   Anxiety    COPD (chronic obstructive pulmonary disease) (HCC)    Depression    GERD (gastroesophageal reflux disease)    Glaucoma    History of colonic polyps    History of kidney stones    Hypercholesteremia    Hyperlipidemia    Hypertension    Neuropathy    Osteoarthritis of left knee    Sleep apnea    Stroke (HCC) 12/10/1996   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 ENDOSCOPY;  Service: Endoscopy;  Laterality: N/A;   CYSTOSCOPY     ESOPHAGOGASTRODUODENOSCOPY     ESOPHAGOGASTRODUODENOSCOPY N/A 09/01/2022   Procedure: ESOPHAGOGASTRODUODENOSCOPY (EGD);  Surgeon: Toledo, Boykin Nearing, MD;  Location: [facility] ENDOSCOPY;  Service: Gastroenterology;  Laterality: N/A;   ESOPHAGOGASTRODUODENOSCOPY (EGD) WITH PROPOFOL N/A 08/25/2018   Procedure:  ESOPHAGOGASTRODUODENOSCOPY (EGD) WITH PROPOFOL;  Surgeon: Scot Jun, MD;  Location: [facility] ENDOSCOPY;  Service: Endoscopy;  Laterality: N/A;   EYE SURGERY     PARTIAL KNEE ARTHROPLASTY Left 01/05/2022   Procedure: UNICOMPARTMENTAL KNEE;  Surgeon: Christena Flake, MD;  Location: [facility] ORS;  Service: Orthopedics;  Laterality: Left;   WISDOM TOOTH EXTRACTION     Patient Active Problem List   Diagnosis Date Noted   DKA (diabetic ketoacidosis) (HCC) 02/11/2023   Known medical problems 02/11/2023   Colitis 02/11/2023   SOB (shortness of breath) 04/09/2021   HLD (hyperlipidemia) 04/09/2021   HTN (hypertension) 04/09/2021   Stroke (HCC) 04/09/2021   Chest pain 04/09/2021   Depression with anxiety 04/09/2021   Anxiety disorder 09/23/2017   MDD (major depressive disorder), single episode, severe with psychotic features (HCC) 03/01/2017   Adjustment disorder with mixed anxiety and depressed mood 02/28/2017   Psychosis, affective (HCC) 02/28/2017   Diabetic retinopathy (HCC) 10/03/2012   GERD (gastroesophageal reflux disease) 04/04/2012   Diabetic neuropathy (HCC) 04/04/2012   DM (diabetes mellitus) type I, controlled, with peripheral vascular disorder (HCC) 02/04/2009   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 Current working diagnosis of Parkinson's Disease with concern for facial drooping, difficulty swallowing, hypophonia, and family history of Parkinson's disease. Pt also experiences severe depression as well as  complex motor tics d/t suspicion for longstanding tic disorder with component of tardive dyskinesia in setting of neuroleptics (Ability, risperidone).   DIAGNOSTIC FINDINGS:   Modified Barium Swallow Study 06/02/2018 "Mild pharyngeal dysphagia characterized by delayed pharyngeal swallow initiation, reduced pharyngeal pressure generation, mild pharyngeal residue, transient laryngeal penetration (nectar-thick and thin liquids), and aspiration X1." Of note, report documents "pt compliant of weak voice" recommend ENT follow up  MR Brain w wo contrast 07/05/2023 - No acute intracranial process. No evidence of acute or subacute  infarct.   Single fiber EMG with [facility] - scheduled 10/12/2023   PAIN:  Are you having pain? No  FALLS: Has patient fallen in last 6 months?  See PT evaluation for details  LIVING ENVIRONMENT: Lives with: lives with their spouse Lives in: House/apartment  PLOF:  Level of assistance: Independent with ADLs, Independent with IADLs Employment: On disability  PATIENT GOALS    to improve speech and swallow function  SUBJECTIVE STATEMENT: "I didn't get a chance to practice this morning" Pt accompanied by: self  OBJECTIVE:   TODAY'S TREATMENT:  Skilled treatment session focused on pt's dysphagia and dysarthria. SLP facilitated session by providing the following interventions:  Training provided in the Six SPEAK OUT! Exercises   WARM-UP   (using "May---Me---My---Moe---Moo") Smooth Connected Speech- Rare Min A Every syllable with INTENT-  Rare Min A Typically produced at 85-90 dB- Supervision A to achieve 85 dB   AH Mouth opened wide- Supervision A Duration (not more than 10 seconds)-  Mod I to achieve 7 seconds Volume consistent throughout- Rare Min A Clear vocal quality throughout- Rare Min A Typically produced at 85-90 dB- Rare Min A to achieve 85 dB   GLIDES Mouth opened wide- Supervision A Steady "ah," then glide up- Rare Min A  Steady "ah," then glide  down-  Rare Min A  Clear vocal quality throughout- Rare Min A  Typically produced at 85-90dB- Supervision A to achieve 85 dB   COUNTING  Smooth and connected- Rare Min A  Neither mono-pitch nor chanted- Rare Min A  Projected up and forward- Rare Min A  Every number exaggerated- Min A   Typically produced at 80-85 dB-  Rare Min A  to achieve 83 dB   READING Be deliberate- Rare Min A  Projected up and forward- Supervision A  Every single word with INTENT- Rare Min A  Typically produced at 75-85 dB- Rare Min A  to achieve 70 dB   CONVERSATION  Begin with structured tasks   Every single word with INTENT- Moderate A         PATIENT EDUCATION: Education details: see above; schedule a Modified Barium Swallow Study Person educated: Patient Education method: Explanation Education comprehension: needs further education   HOME EXERCISE PROGRAM: Speak Out assignments   GOALS: Goals reviewed with patient? Yes  SHORT TERM GOALS: Target date: 10 sessions  With Rare Min A, patient will demo HEP for motor speech accurately in 5/7 opportunities.  Baseline: Goal status: INITIAL  2.  To determine optimal resistance levels for Respiratory Muscle Training (RMT) for improving increase hyolaryngeal elevation  and strengthen cough for airway clearance, patient will participate in evaluation (and re-assessment as needed) of maximum expiratory pressure (MEP). Baseline:  Goal status: INITIAL  3.  With Min A, patient will complete 3 sets of 10 repetitions with EMST set at TBD cmH2O with self-reported effortful of 7 out of 10. Baseline:  Goal status: INITIAL  4.  With Min A, patient will improve speech intelligibility for  phrase by controlling rate of speech, over-articulation, and increased loudness to achieve 75% intelligibility.  Baseline:  Goal status: INITIAL   LONG TERM GOALS: Target date: 11/21/2023  With Supervision A, patient will participate in 15 minutes conversation,  maintaining average loudness of 75 dB and loud, good quality voice.   Baseline:  Goal status: INITIAL  2.  Patient will report improved communication effectiveness as measured by Communicative Effectiveness Survey. Baseline:  Goal status: INITIAL  3.  Patient will participate in objective swallowing evaluation (MBSS) to identify safest diet recommendation as well as therapeutic targets.  Baseline:  Goal status: INITIAL  4.  Patient will improve perception of swallowing as indicated by an improvement in EAT-10 score by 12 weeks from initial swallowing therapy session.  Baseline:  Goal status: INITIAL   ASSESSMENT:  CLINICAL IMPRESSION: Patient is a 68 year old male y.o.  who was seen today for a speech language treatment d/t Parkinson's Disease. Pt presents with moderate hypokinetic dysarthria that is c/b reduce articulation, volume, fast rate of speech, breathy hoarse vocal quality as well as report of s/s of potential aspiration with all PO consumption.   Pt with much improved voicing during carry over activities. See the above treatment note for details.   OBJECTIVE IMPAIRMENTS include expressive language, dysarthria, voice disorder, and dysphagia. These impairments are limiting patient from managing medications, managing appointments, managing finances, household responsibilities, ADLs/IADLs, effectively communicating at home and in community, and safety when swallowing. Factors affecting potential to achieve goals and functional outcome are co-morbidities, medical prognosis, and severity of impairments. Patient will benefit from skilled SLP services to address above impairments and improve overall function.  REHAB POTENTIAL: Good  PLAN: SLP FREQUENCY: 2x/week  SLP DURATION: 12 weeks  PLANNED INTERVENTIONS: Aspiration precaution training, Pharyngeal strengthening exercises, Diet toleration management , Trials of upgraded texture/liquids, Oral motor exercises, Functional tasks,  Multimodal communication approach, SLP instruction and feedback, Compensatory strategies, and Patient/family education   Emmons Toth B. Dreama Saa, M.S., CCC-SLP, Tree surgeon Certified Brain Injury Specialist [facility]  [facility] Rehabilitation Services Office [phone] Ascom [phone] Fax [phone]

## 2023-09-14 NOTE — Telephone Encounter (Signed)
Message left to call to get another date/time for follow up. He is scheduled at another [facility] facility on 12/07/23 at 1:40 and that is the date/time provider gave. Will wait for him to call and get another appointment scheduled

## 2023-09-16 ENCOUNTER — Ambulatory Visit: Payer: Medicare Other

## 2023-09-19 ENCOUNTER — Ambulatory Visit: Payer: Medicare Other | Attending: Family Medicine | Admitting: Physical Therapy

## 2023-09-19 DIAGNOSIS — R2689 Other abnormalities of gait and mobility: Secondary | ICD-10-CM | POA: Insufficient documentation

## 2023-09-19 DIAGNOSIS — M6281 Muscle weakness (generalized): Secondary | ICD-10-CM | POA: Diagnosis present

## 2023-09-19 DIAGNOSIS — R471 Dysarthria and anarthria: Secondary | ICD-10-CM | POA: Diagnosis present

## 2023-09-19 DIAGNOSIS — R1312 Dysphagia, oropharyngeal phase: Secondary | ICD-10-CM | POA: Insufficient documentation

## 2023-09-19 DIAGNOSIS — R41841 Cognitive communication deficit: Secondary | ICD-10-CM | POA: Diagnosis present

## 2023-09-19 NOTE — Therapy (Signed)
OUTPATIENT PHYSICAL THERAPY NEURO TREATMENT  Patient Name: Edgar Huynh MRN: [id number] DOB:1956-05-14, 68 y.o., male Today's Date: 09/19/2023   PCP: Edgar Corner, PA-C  REFERRING PROVIDER: Wilford Corner, PA-C   END OF SESSION:  PT End of Session - 09/19/23 1114     Visit Number 6    Number of Visits 24    Date for PT Re-Evaluation 11/23/23    PT Start Time 1104    PT Stop Time 1145    PT Time Calculation (min) 41 min    Equipment Utilized During Treatment Gait belt    Activity Tolerance Patient tolerated treatment well    Behavior During Therapy WFL for tasks assessed/performed             Past Medical History:  Diagnosis Date   Anxiety    COPD (chronic obstructive pulmonary disease) (HCC)    Depression    GERD (gastroesophageal reflux disease)    Glaucoma    History of colonic polyps    History of kidney stones    Hypercholesteremia    Hyperlipidemia    Hypertension    Neuropathy    Osteoarthritis of left knee    Sleep apnea    Stroke (HCC) 12/10/1996   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   TIA (transient ischemic attack)    Type 1 diabetes (HCC)    on insulin pump   Past Surgical History:  Procedure Laterality Date   CAROTID ARTERY ANGIOPLASTY Right 10/18/2004   CATARACT EXTRACTION, BILATERAL Bilateral    COLONOSCOPY     COLONOSCOPY WITH PROPOFOL N/A 08/25/2018   Procedure: COLONOSCOPY WITH PROPOFOL;  Surgeon: Edgar Jun, MD;  Location: [facility] ENDOSCOPY;  Service: Endoscopy;  Laterality: N/A;   CYSTOSCOPY     ESOPHAGOGASTRODUODENOSCOPY     ESOPHAGOGASTRODUODENOSCOPY N/A 09/01/2022   Procedure: ESOPHAGOGASTRODUODENOSCOPY (EGD);  Surgeon: Toledo, Boykin Nearing, MD;  Location: [facility] ENDOSCOPY;  Service: Gastroenterology;  Laterality: N/A;   ESOPHAGOGASTRODUODENOSCOPY (EGD) WITH PROPOFOL N/A 08/25/2018   Procedure: ESOPHAGOGASTRODUODENOSCOPY (EGD) WITH PROPOFOL;  Surgeon: Edgar Jun, MD;  Location: [facility]  ENDOSCOPY;  Service: Endoscopy;  Laterality: N/A;   EYE SURGERY     PARTIAL KNEE ARTHROPLASTY Left 01/05/2022   Procedure: UNICOMPARTMENTAL KNEE;  Surgeon: Edgar Flake, MD;  Location: [facility] ORS;  Service: Orthopedics;  Laterality: Left;   WISDOM TOOTH EXTRACTION     Patient Active Problem List   Diagnosis Date Noted   DKA (diabetic ketoacidosis) (HCC) 02/11/2023   Known medical problems 02/11/2023   Colitis 02/11/2023   SOB (shortness of breath) 04/09/2021   HLD (hyperlipidemia) 04/09/2021   HTN (hypertension) 04/09/2021   Stroke (HCC) 04/09/2021   Chest pain 04/09/2021   Depression with anxiety 04/09/2021   Anxiety disorder 09/23/2017   MDD (major depressive disorder), single episode, severe with psychotic features (HCC) 03/01/2017   Adjustment disorder with mixed anxiety and depressed mood 02/28/2017   Psychosis, affective (HCC) 02/28/2017   Diabetic retinopathy (HCC) 10/03/2012   GERD (gastroesophageal reflux disease) 04/04/2012   Diabetic neuropathy (HCC) 04/04/2012   DM (diabetes mellitus) type I, controlled, with peripheral vascular disorder (HCC) 02/04/2009   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    Pt accompanied by: self  PERTINENT HISTORY:   From Neurologist "Edgar Huynh mentioned that he is taking his medications as prescribed. 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   Concern for myasthenia gravis vs. parkinsonism in patient presenting with multiple neurological symptoms of facial drooping, difficulty swallowing, decrease speech volume, stiffness and short gait, shortness of breath and coughing. Patient states the symptoms started in 04/2023. Patient denise fluctuating droopy eyelids, starting new medications around the time of onset of symptoms. Patient does have a positive family history of Parkinson's disease in father. Negative for acute stroke, per 06/2023 MRI"   PAIN:  Are you having pain? No and occasional knee pain with falls >6 months ago   PRECAUTIONS: None  RED FLAGS: None   WEIGHT BEARING RESTRICTIONS: No  FALLS: Has patient fallen in last 6 months? No and several near falls  poor corrections of anterior LOB   LIVING ENVIRONMENT: Lives with: lives with their spouse Lives in: House/apartment Stairs: Yes: Internal: 13 steps; on left going up and External: 2 steps; on left going up Has following equipment at home: None  PLOF: Independent, Independent with basic ADLs, Independent with gait, and Independent with transfers  PATIENT GOALS: improve groggy drowsy feeling. Improve endurance and strength.   OBJECTIVE:  Note: Objective measures were completed at Evaluation unless otherwise noted.  DIAGNOSTIC FINDINGS:   MRI 11/19  IMPRESSION: Scattered and confluent T2 hyperintense signal in the periventricular white matter, likely the sequela of moderate chronic small vessel ischemic disease. Remote lacunar infarcts in the pons and left caudate. No acute intracranial process. No evidence of acute or subacute infarct.  COGNITION: Overall cognitive status: Within functional limits for tasks assessed   SENSATION: WFL States increased sensitivity in BLE  COORDINATION: Dysdidodactic kinesia: WFL  Finger ot nose WFL  Ankle to knee: Mild decreased speed on the L and mild under  shooting.    EDEMA:  WFL   MUSCLE TONE:  Grossly WFL  POSTURE: rounded shoulders and forward head  LOWER EXTREMITY ROM:     WFL  LOWER EXTREMITY MMT:    MMT Right Eval Left Eval  Hip flexion 4- 4-  Hip extension    Hip abduction 4- 4-  Hip adduction 4 4  Hip internal rotation    Hip external rotation    Knee flexion 4 4  Knee extension 4+ 4+  Ankle dorsiflexion 4+ 4+  Ankle plantarflexion    Ankle inversion    Ankle eversion    (Blank rows = not tested)  BED MOBILITY:  Sit to supine Complete Independence Supine to sit Complete Independence  TRANSFERS: Assistive device utilized: None  Sit to stand: Complete Independence Stand to sit: Complete Independence Chair to chair: Complete Independence Floor:  TBD  RAMP:  Level of Assistance: Complete Independence Assistive device utilized: None Ramp Comments:   CURB:  Level of Assistance: Complete Independence Assistive device utilized: None Curb Comments:   STAIRS: Level of Assistance: Complete Independence Stair Negotiation Technique: Alternating Pattern  with No Rails Number of Stairs: 4  Height of Stairs: 6  Comments: WFL  GAIT: Gait pattern:   , step through pattern, decreased arm swing- Right, decreased arm swing- Left, decreased trunk rotation, and trunk flexed Distance walked: 116ft Assistive device utilized: None Level of assistance: Complete Independence Comments:   FUNCTIONAL TESTS:  5  times sit to stand: 11.51 Timed up and go (TUG): 8.25 6 minute walk test: 690ft 10 meter walk test: 7.9 and 8.3  Berg Balance Scale: 49 Functional gait assessment: 25  PATIENT SURVEYS:  FOTO 53                                                                                                                              TREATMENT DATE: 09/14/2023  Nustep BLE reciprocal movement training x 4 min for neurologic priming.   Sit<>stand with arm swing into extension 2 x 12  Reciprocal step over half bolster  2x9 bil  Lateral step over half bolster x 10 bil, rated as easy  Side stepping on airex beam x 6 laps  Tandem gait on airex beam x 3 laps with CGA assist. Cues for step length to improve position of feet on beam and reduced ankle roll over the edge.   Weighted gait training with 3# AW x 428ft followed by prolonged therapeutic rest break due to BLE fatigue  Seated therex:  LAQ x 12 3#AW 2 sec hold on each side.  Reciprocal march 3# AW x 12 bil  Ankle PF x 25 3 # AW Ankle DF x 20 3 # AW  Sit<>stand x 10      PATIENT EDUCATION: Education details: POC, HEP  Person educated: Patient Education method: Explanation Education comprehension: verbalized understanding  HOME EXERCISE PROGRAM: Access Code: [email or web address] URL: [email or web address] Date: 09/05/2023 Prepared by: Grier Rocher  Exercises - Sit to Stand  - 1 x daily - 2-3 x weekly - 2 sets - 10 reps - Standing Tandem Balance with Counter Support  - 1 x daily - 2-3 x weekly - 4 sets - 10 reps - 15 hold - Single Leg Stance with Support  - 1 x daily - 2-3 x weekly - 4 sets - 10 reps - 10 hold - Standing Hip Abduction with Counter Support  - 1 x daily - 2-3 x weekly - 2 sets - 10 reps - Standing March with Counter Support  - 1 x daily - 2-3 x weekly - 2 sets - 10 reps  GOALS: Goals reviewed with patient? Yes   SHORT TERM GOALS: Target date: 09/29/2023    Patient will be independent in home exercise program to improve strength/mobility for better functional independence with ADLs. Baseline: to be given at session 2.  Goal status: INITIAL   LONG TERM GOALS: Target date: 11/24/2023    Patient will increase FOTO score  by equal to or greater than  4 points  to demonstrate statistically significant improvement in mobility and quality of life.  Baseline: 53 Goal status: INITIAL  2.  Patient (> 81 years old) will improve SLS by < 15 seconds indicating an increased LE strength and improved balance. Baseline: 4 sec  bil Goal status: INITIAL  3.  Patient will increase Berg Balance score by > 4 points to demonstrate decreased fall risk during  functional activities Baseline: 49 Goal status: INITIAL  4.  Patient will increase 6 min walk test by 177ft  as to improve gait speed for better community ambulation and to reduce fall risk. Baseline: 669ft for 3:37min  Goal status: INITIAL   6.  Patient will increase FGA score to >27 as to demonstrate reduced fall risk and improved dynamic gait balance for better safety with community/home ambulation.   Baseline: 25 Goal status: INITIAL   ASSESSMENT:  CLINICAL IMPRESSION: Treatment focused on dynamic balance and Bil strengthening. Pt tolerated treatment well, but required prolonged therapeutic rest break between bouts due to BLE fatigue. Noted to tolerate increased distance on weighted gait training in single bout compared to last session. Reports reciprocal and tandem stepping are hard on his balance.  Pt will benefit from skilled to PT improve function, reduce fall risk, and return to PLOF.    OBJECTIVE IMPAIRMENTS: Abnormal gait, cardiopulmonary status limiting activity, decreased activity tolerance, decreased balance, decreased cognition, decreased endurance, decreased mobility, increased fascial restrictions, and increased muscle spasms.   ACTIVITY LIMITATIONS: carrying, lifting, bending, stairs, transfers, and locomotion level  PARTICIPATION LIMITATIONS: community activity and yard work  PERSONAL FACTORS: Age, Fitness, and 1-2 comorbidities: CVA and PD vs myasthenia gravis   are also affecting patient's functional outcome.   REHAB POTENTIAL: Excellent  CLINICAL DECISION MAKING: Stable/uncomplicated  EVALUATION COMPLEXITY: Low  PLAN:  PT FREQUENCY: 1-2x/week  PT DURATION: 12 weeks  PLANNED INTERVENTIONS: 97110-Therapeutic exercises, 97530- Therapeutic activity, [id number]- Neuromuscular re-education, 97535- Self Care, [id number]- Manual therapy, [phone]-  Gait training, [phone]- Splinting, Patient/Family education, Balance training, Stair training, Taping, Joint mobilization, Joint manipulation, DME instructions, Wheelchair mobility training, Cryotherapy, and Moist heat  PLAN FOR NEXT SESSION:   Continued POC Improved activity tolerance and BLE strengthening  High level balance training.   Golden Pop, PT 09/19/2023, 11:17 AM

## 2023-09-21 ENCOUNTER — Ambulatory Visit: Payer: Medicare Other | Admitting: Physical Therapy

## 2023-09-21 ENCOUNTER — Ambulatory Visit: Payer: Medicare Other | Admitting: Speech Pathology

## 2023-09-21 DIAGNOSIS — R2689 Other abnormalities of gait and mobility: Secondary | ICD-10-CM

## 2023-09-21 DIAGNOSIS — M6281 Muscle weakness (generalized): Secondary | ICD-10-CM

## 2023-09-21 DIAGNOSIS — R471 Dysarthria and anarthria: Secondary | ICD-10-CM

## 2023-09-21 NOTE — Therapy (Signed)
 OUTPATIENT SPEECH LANGUAGE PATHOLOGY  [facility] T[facility] NOTE   Patient Name: Edgar Huynh MRN: [id number] DOB:March 23, 1956, 68 y.o., male Today's Date: 09/21/2023  PCP: Selinda Azalee Quan REFERRING PROVIDER: Jannett Fairly, MD   End of Session - 09/21/23 1319     Visit Number 5    Number of Visits 25    Date for SLP Re-Evaluation 11/21/23    Authorization Type [employer]    Authorization Time Period 09/05/2023 thur 11/28/2023    Authorization - Visit Number 5    Authorization - Number of Visits 24    Progress Note Due on Visit 10    SLP Start Time 1315    SLP Stop Time  1400    SLP Time Calculation (min) 45 min    Activity Tolerance Patient tolerated treatment well              Past Medical History:  Diagnosis Date   Anxiety    COPD (chronic obstructive pulmonary disease) (HCC)    Depression    GERD (gastroesophageal reflux disease)    Glaucoma    History of colonic polyps    History of kidney stones    Hypercholesteremia    Hyperlipidemia    Hypertension    Neuropathy    Osteoarthritis of left knee    Sleep apnea    Stroke (HCC) 12/10/1996   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   TIA (transient ischemic attack)    Type 1 diabetes (HCC)    on insulin  pump   Past Surgical History:  Procedure Laterality Date   CAROTID ARTERY ANGIOPLASTY Right 10/18/2004   CATARACT EXTRACTION, BILATERAL Bilateral    COLONOSCOPY     COLONOSCOPY WITH PROPOFOL  N/A 08/25/2018   Procedure: COLONOSCOPY WITH PROPOFOL ;  Surgeon: Viktoria Lamar DASEN, MD;  Location: [facility] ENDOSCOPY;  Service: Endoscopy;  Laterality: N/A;   CYSTOSCOPY     ESOPHAGOGASTRODUODENOSCOPY     ESOPHAGOGASTRODUODENOSCOPY N/A 09/01/2022   Procedure: ESOPHAGOGASTRODUODENOSCOPY (EGD);  Surgeon: Toledo, Ladell POUR, MD;  Location: [facility] ENDOSCOPY;  Service: Gastroenterology;  Laterality: N/A;   ESOPHAGOGASTRODUODENOSCOPY (EGD) WITH PROPOFOL  N/A 08/25/2018   Procedure:  ESOPHAGOGASTRODUODENOSCOPY (EGD) WITH PROPOFOL ;  Surgeon: Viktoria Lamar DASEN, MD;  Location: [facility] ENDOSCOPY;  Service: Endoscopy;  Laterality: N/A;   EYE SURGERY     PARTIAL KNEE ARTHROPLASTY Left 01/05/2022   Procedure: UNICOMPARTMENTAL KNEE;  Surgeon: Edie Norleen PARAS, MD;  Location: [facility] ORS;  Service: Orthopedics;  Laterality: Left;   WISDOM TOOTH EXTRACTION     Patient Active Problem List   Diagnosis Date Noted   DKA (diabetic ketoacidosis) (HCC) 02/11/2023   Known medical problems 02/11/2023   Colitis 02/11/2023   SOB (shortness of breath) 04/09/2021   HLD (hyperlipidemia) 04/09/2021   HTN (hypertension) 04/09/2021   Stroke (HCC) 04/09/2021   Chest pain 04/09/2021   Depression with anxiety 04/09/2021   Anxiety disorder 09/23/2017   MDD (major depressive disorder), single episode, severe with psychotic features (HCC) 03/01/2017   Adjustment disorder with mixed anxiety and depressed mood 02/28/2017   Psychosis, affective (HCC) 02/28/2017   Diabetic retinopathy (HCC) 10/03/2012   GERD (gastroesophageal reflux disease) 04/04/2012   Diabetic neuropathy (HCC) 04/04/2012   DM (diabetes mellitus) type I, controlled, with peripheral vascular disorder (HCC) 02/04/2009   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 Current working diagnosis of Parkinson's Disease with concern for facial drooping, difficulty swallowing, hypophonia, and family history of Parkinson's disease. Pt also experiences severe depression as well as  complex motor tics d/t suspicion for longstanding tic disorder with component of tardive dyskinesia in setting of neuroleptics (Ability, risperidone).   DIAGNOSTIC FINDINGS:   Modified Barium Swallow Study 06/02/2018 Mild pharyngeal dysphagia characterized by delayed pharyngeal swallow initiation, reduced pharyngeal pressure generation, mild pharyngeal residue, transient laryngeal penetration (nectar-thick and thin liquids), and aspiration X1. Of note, report documents pt compliant of weak voice recommend ENT follow up  MR Brain w wo contrast 07/05/2023 - No acute intracranial process. No evidence of acute or subacute  infarct.   Single fiber EMG with [facility] - scheduled 10/12/2023   PAIN:  Are you having pain? No  FALLS: Has patient fallen in last 6 months?  See PT evaluation for details  LIVING ENVIRONMENT: Lives with: lives with their spouse Lives in: House/apartment  PLOF:  Level of assistance: Independent with ADLs, Independent with IADLs Employment: On disability  PATIENT GOALS    to improve speech and swallow function  SUBJECTIVE STATEMENT: Pt had questions about HEP Pt accompanied by: self  OBJECTIVE:   TODAY'S TREATMENT:  Skilled treatment session focused on pt's dysphagia and dysarthria. SLP facilitated session by providing the following interventions:  SLP provided written instructions with daily check-off provided on HEP, all questions answered to pt satisfaction.   Training provided in the Six SPEAK OUT! Exercises   WARM-UP   (using May---Me---My---Moe---Moo) Smooth Connected Speech- Moderate A Every syllable with INTENT-  Moderate A Typically produced at 85-90 dB- Moderate A to achieve 85 dB   AH Mouth opened wide- Moderate A Duration (not more than 10 seconds)-  Min A to achieve 7 seconds Volume consistent throughout- Maximal A Clear vocal quality throughout- Maximal A Typically produced at 85-90 dB- Moderate A to achieve 85 dB   GLIDES Mouth  opened wide- Supervision A Steady ah, then glide up- Maximal A  Steady ah, then glide down-  Maximal A  Clear vocal quality throughout- Maximal A  Typically produced at 85-90dB- Moderate A to achieve 85 dB   COUNTING  Smooth and connected- Moderate A  Neither mono-pitch nor chanted- Rare Min A  Projected up and forward- Moderate A  Every number exaggerated- Min A   Typically produced at 80-85 dB-  Moderate A  to achieve 83 dB   READING Be deliberate- Moderate A  Projected up and forward- Moderate A  Every single word with INTENT- Moderate A  Typically produced at 75-85 dB- Moderate A  to achieve 70 dB   CONVERSATION  Begin with structured tasks   Every single word with INTENT- Moderate A         PATIENT EDUCATION: Education details: see above; schedule a Modified Barium Swallow Study Person educated: Patient Education method: Explanation Education comprehension: needs further education   HOME EXERCISE PROGRAM: Speak Out assignments   GOALS: Goals reviewed with patient? Yes  SHORT TERM GOALS: Target date: 10 sessions  With Rare Min A, patient will demo HEP for motor speech accurately in 5/7 opportunities.  Baseline: Goal status: INITIAL  2.  To determine optimal resistance levels for Respiratory Muscle Training (RMT) for improving increase hyolaryngeal elevation  and strengthen cough for airway clearance, patient will participate in evaluation (and re-assessment as needed) of maximum expiratory pressure (MEP). Baseline:  Goal status: INITIAL  3.  With Min A, patient will complete 3 sets of 10 repetitions with EMST set at TBD cmH2O with self-reported effortful of 7 out of 10. Baseline:  Goal status: INITIAL  4.  With Min A, patient will improve speech intelligibility for  phrase by controlling rate of speech, over-articulation, and increased loudness to achieve 75% intelligibility.  Baseline:  Goal status: INITIAL   LONG TERM GOALS: Target date:  11/21/2023  With Supervision A, patient will participate in 15 minutes conversation, maintaining average loudness of 75 dB and loud, good quality voice.   Baseline:  Goal status: INITIAL  2.  Patient will report improved communication effectiveness as measured by Communicative Effectiveness Survey. Baseline:  Goal status: INITIAL  3.  Patient will participate in objective swallowing evaluation (MBSS) to identify safest diet recommendation as well as therapeutic targets.  Baseline:  Goal status: INITIAL  4.  Patient will improve perception of swallowing as indicated by an improvement in EAT-10 score by 12 weeks from initial swallowing therapy session.  Baseline:  Goal status: INITIAL   ASSESSMENT:  CLINICAL IMPRESSION: Patient is a 67 year old male y.o.  who was seen today for a speech language treatment d/t Parkinson's Disease. Pt presents with moderate hypokinetic dysarthria that is c/b reduce articulation, volume, fast rate of speech, breathy hoarse vocal quality as well as report of s/s of potential aspiration with all PO consumption.   Pt required increased cues during today's session. He reports increased brain fuzziness potentially related to medication he took prior to session. Pt also observed with increased dyskinesias that were also simultaneous with fluctuations in vocal quality.  See the above treatment note for details.   OBJECTIVE IMPAIRMENTS include expressive language, dysarthria, voice disorder, and dysphagia. These impairments are limiting patient from managing medications, managing appointments, managing finances, household responsibilities, ADLs/IADLs, effectively communicating at home and in community, and safety when swallowing. Factors affecting potential to achieve goals and functional outcome are co-morbidities, medical prognosis, and severity of impairments. Patient will benefit from skilled SLP services to address above impairments and improve overall  function.  REHAB POTENTIAL: Good  PLAN: SLP FREQUENCY: 2x/week  SLP DURATION: 12 weeks  PLANNED INTERVENTIONS: Aspiration precaution training, Pharyngeal strengthening exercises, Diet toleration management , Trials of upgraded texture/liquids, Oral motor exercises, Functional tasks, Multimodal communication approach, SLP instruction and feedback, Compensatory strategies, and Patient/family education   Jontavia Leatherbury B. Rubbie, M.S., CCC-SLP, Tree Surgeon Certified Brain Injury Specialist [facility]  [facility] Rehabilitation Services Office [phone] Ascom [phone] Fax [phone]

## 2023-09-21 NOTE — Therapy (Signed)
 OUTPATIENT PHYSICAL THERAPY NEURO TREATMENT  Patient Name: Edgar Huynh MRN: [id number] DOB:[birth year], 68 y.o., male Today's Date: 09/21/2023   PCP: Cyrus Selinda Moose, PA-C  REFERRING PROVIDER: Cyrus Selinda Moose, PA-C   END OF SESSION:  PT End of Session - 09/21/23 1347     Visit Number 7    Number of Visits 24    Date for PT Re-Evaluation 11/23/23    PT Start Time 1402    PT Stop Time 1445    PT Time Calculation (min) 43 min    Equipment Utilized During Treatment Gait belt    Activity Tolerance Patient tolerated treatment well    Behavior During Therapy WFL for tasks assessed/performed             Past Medical History:  Diagnosis Date   Anxiety    COPD (chronic obstructive pulmonary disease) (HCC)    Depression    GERD (gastroesophageal reflux disease)    Glaucoma    History of colonic polyps    History of kidney stones    Hypercholesteremia    Hyperlipidemia    Hypertension    Neuropathy    Osteoarthritis of left knee    Sleep apnea    Stroke (HCC) 12/10/1996   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   TIA (transient ischemic attack)    Type 1 diabetes (HCC)    on insulin  pump   Past Surgical History:  Procedure Laterality Date   CAROTID ARTERY ANGIOPLASTY Right 10/18/2004   CATARACT EXTRACTION, BILATERAL Bilateral    COLONOSCOPY     COLONOSCOPY WITH PROPOFOL  N/A 08/25/2018   Procedure: COLONOSCOPY WITH PROPOFOL ;  Surgeon: Viktoria Lamar DASEN, MD;  Location: [facility] ENDOSCOPY;  Service: Endoscopy;  Laterality: N/A;   CYSTOSCOPY     ESOPHAGOGASTRODUODENOSCOPY     ESOPHAGOGASTRODUODENOSCOPY N/A 09/01/2022   Procedure: ESOPHAGOGASTRODUODENOSCOPY (EGD);  Surgeon: Toledo, Ladell POUR, MD;  Location: [facility] ENDOSCOPY;  Service: Gastroenterology;  Laterality: N/A;   ESOPHAGOGASTRODUODENOSCOPY (EGD) WITH PROPOFOL  N/A 08/25/2018   Procedure: ESOPHAGOGASTRODUODENOSCOPY (EGD) WITH PROPOFOL ;  Surgeon: Viktoria Lamar DASEN, MD;  Location: [facility]  ENDOSCOPY;  Service: Endoscopy;  Laterality: N/A;   EYE SURGERY     PARTIAL KNEE ARTHROPLASTY Left 01/05/2022   Procedure: UNICOMPARTMENTAL KNEE;  Surgeon: Edie Norleen PARAS, MD;  Location: [facility] ORS;  Service: Orthopedics;  Laterality: Left;   WISDOM TOOTH EXTRACTION     Patient Active Problem List   Diagnosis Date Noted   DKA (diabetic ketoacidosis) (HCC) 02/11/2023   Known medical problems 02/11/2023   Colitis 02/11/2023   SOB (shortness of breath) 04/09/2021   HLD (hyperlipidemia) 04/09/2021   HTN (hypertension) 04/09/2021   Stroke (HCC) 04/09/2021   Chest pain 04/09/2021   Depression with anxiety 04/09/2021   Anxiety disorder 09/23/2017   MDD (major depressive disorder), single episode, severe with psychotic features (HCC) 03/01/2017   Adjustment disorder with mixed anxiety and depressed mood 02/28/2017   Psychosis, affective (HCC) 02/28/2017   Diabetic retinopathy (HCC) 10/03/2012   GERD (gastroesophageal reflux disease) 04/04/2012   Diabetic neuropathy (HCC) 04/04/2012   DM (diabetes mellitus) type I, controlled, with peripheral vascular disorder (HCC) 02/04/2009   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 No other updates.   Pt accompanied by: self  PERTINENT HISTORY:   From Neurologist Mr. Vanderheiden mentioned that he is taking his medications as prescribed. 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   Concern for myasthenia gravis vs. parkinsonism in patient presenting with multiple neurological symptoms of facial drooping, difficulty swallowing, decrease speech volume, stiffness and short gait, shortness of breath and coughing. Patient states the symptoms started in 04/2023. Patient denise fluctuating droopy eyelids, starting new medications around the time of onset of symptoms. Patient does have a positive family history of Parkinson's disease in father. Negative for acute stroke, per 06/2023 MRI   PAIN:  Are you having pain? No and occasional knee pain with falls >6 months ago   PRECAUTIONS: None  RED FLAGS: None   WEIGHT BEARING RESTRICTIONS: No  FALLS: Has patient fallen in last 6 months? No and several near falls  poor corrections of anterior LOB   LIVING ENVIRONMENT: Lives with: lives with their spouse Lives in: House/apartment Stairs: Yes: Internal: 13 steps; on left going up and External: 2 steps; on left going up Has following equipment at home: None  PLOF: Independent, Independent with basic ADLs, Independent with gait, and Independent with transfers  PATIENT GOALS: improve groggy drowsy feeling. Improve endurance and strength.   OBJECTIVE:  Note: Objective measures were completed at Evaluation unless otherwise noted.  DIAGNOSTIC FINDINGS:   MRI 11/19  IMPRESSION: Scattered and confluent T2 hyperintense signal in the periventricular white matter, likely the sequela of moderate chronic small vessel ischemic disease. Remote lacunar infarcts in the pons and left caudate. No acute intracranial process. No evidence of acute or subacute infarct.  COGNITION: Overall cognitive status: Within functional limits for tasks assessed   SENSATION: WFL States increased sensitivity in BLE  COORDINATION: Dysdidodactic kinesia: WFL  Finger ot nose WFL  Ankle to knee: Mild decreased speed on  the L and mild under shooting.    EDEMA:  WFL   MUSCLE TONE:  Grossly WFL  POSTURE: rounded shoulders and forward head  LOWER EXTREMITY ROM:     WFL  LOWER EXTREMITY MMT:    MMT Right Eval Left Eval  Hip flexion 4- 4-  Hip extension    Hip abduction 4- 4-  Hip adduction 4 4  Hip internal rotation    Hip external rotation    Knee flexion 4 4  Knee extension 4+ 4+  Ankle dorsiflexion 4+ 4+  Ankle plantarflexion    Ankle inversion    Ankle eversion    (Blank rows = not tested)  BED MOBILITY:  Sit to supine Complete Independence Supine to sit Complete Independence  TRANSFERS: Assistive device utilized: None  Sit to stand: Complete Independence Stand to sit: Complete Independence Chair to chair: Complete Independence Floor:  TBD  RAMP:  Level of Assistance: Complete Independence Assistive device utilized: None Ramp Comments:   CURB:  Level of Assistance: Complete Independence Assistive device utilized: None Curb Comments:   STAIRS: Level of Assistance: Complete Independence Stair Negotiation Technique: Alternating Pattern  with No Rails Number of Stairs: 4  Height of Stairs: 6  Comments: WFL  GAIT: Gait pattern:   , step through pattern, decreased arm swing- Right, decreased arm swing- Left, decreased trunk rotation, and trunk flexed Distance walked: 135ft Assistive device utilized: None Level of assistance: Complete Independence Comments:   FUNCTIONAL TESTS:  5 times sit to stand: 11.51 Timed up and go (TUG): 8.25 6 minute walk test: 621ft 10 meter walk test: 7.9 and 8.3  Berg Balance Scale: 49 Functional gait assessment: 25  PATIENT SURVEYS:  FOTO 53                                                                                                                              TREATMENT DATE: 09/14/2023  Nustep BLE reciprocal movement training x 5 min for neurologic priming.   Gait through obstacle course to step up/down 4 inch step, over  3 half bolsters, weave through 8 cones, walk across airex beam. X 3 without resistance, x 3 with 3# AW. CGA for safety with mild lateral lean to the L with gait across airex beam and AW donned   Weighted gait training x 1052ft with cues for full step length with fatigue for last 249ft. Performed with 3 # AW   Activity Description: 4 pods on table, 4 pods on 4inch step, BLE 3# AW  Activity Setting:  random Number of Pods:  6 Cycles/Sets:  3 Duration (Time or Hit Count):  45 sec   Patient Stats  Hits:   26, 31, 34  Tandem gait in parallel bar x 5 laps with supervision assist and cued for decreased speed and decreased UE support as tolerated.   Throughout session, PT provided CGA for safety with gait belt in place to reduce fall risk and improve safety      PATIENT EDUCATION: Education details: POC, HEP  Person educated: Patient Education method: Explanation Education comprehension: verbalized understanding  HOME EXERCISE PROGRAM: Access Code: [email or web address] URL: [email or web address] Date: 09/05/2023 Prepared by: Massie Dollar  Exercises - Sit to Stand  - 1 x daily - 2-3 x weekly - 2 sets - 10 reps - Standing Tandem Balance with Counter Support  - 1 x daily - 2-3 x weekly - 4 sets - 10 reps - 15 hold - Single Leg Stance with Support  - 1 x daily - 2-3 x weekly - 4 sets - 10 reps - 10 hold - Standing Hip Abduction with Counter Support  - 1 x daily - 2-3 x weekly - 2 sets - 10 reps - Standing March with Counter Support  - 1 x daily - 2-3 x weekly - 2 sets - 10 reps  GOALS: Goals reviewed with patient? Yes   SHORT TERM GOALS: Target date: 09/29/2023    Patient will be independent in home exercise program to improve strength/mobility for better functional independence with ADLs. Baseline: to be given at session 2.  Goal status: INITIAL   LONG TERM GOALS: Target date: 11/24/2023    Patient will increase FOTO score  by equal to or greater than  4 points  to  demonstrate statistically significant improvement in mobility and quality of life.  Baseline: 53 Goal status: INITIAL  2.  Patient (> 26 years old) will improve SLS by <  15 seconds indicating an increased LE strength and improved balance. Baseline: 4 sec bil Goal status: INITIAL  3.  Patient will increase Berg Balance score by > 4 points to demonstrate decreased fall risk during functional activities Baseline: 49 Goal status: INITIAL  4.  Patient will increase 6 min walk test by 118ft  as to improve gait speed for better community ambulation and to reduce fall risk. Baseline: 650ft for 3:63min  Goal status: INITIAL   6.  Patient will increase FGA score to >27 as to demonstrate reduced fall risk and improved dynamic gait balance for better safety with community/home ambulation.   Baseline: 25 Goal status: INITIAL   ASSESSMENT:  CLINICAL IMPRESSION: Treatment focused on dynamic balance  and gait training. Instructed in obstacle navigation with and without added weight. The Blaze Pod Random setting was chosen to enhance cognitive processing and agility, providing an unpredictable environment to simulate real-world scenarios, and fostering quick reactions and adaptability. Improved success with tandem gait on level and unlevel surface on this day.  Pt will benefit from skilled to PT improve function, reduce fall risk, and return to PLOF.    OBJECTIVE IMPAIRMENTS: Abnormal gait, cardiopulmonary status limiting activity, decreased activity tolerance, decreased balance, decreased cognition, decreased endurance, decreased mobility, increased fascial restrictions, and increased muscle spasms.   ACTIVITY LIMITATIONS: carrying, lifting, bending, stairs, transfers, and locomotion level  PARTICIPATION LIMITATIONS: community activity and yard work  PERSONAL FACTORS: Age, Fitness, and 1-2 comorbidities: CVA and PD vs myasthenia gravis   are also affecting patient's functional outcome.   REHAB  POTENTIAL: Excellent  CLINICAL DECISION MAKING: Stable/uncomplicated  EVALUATION COMPLEXITY: Low  PLAN:  PT FREQUENCY: 1-2x/week  PT DURATION: 12 weeks  PLANNED INTERVENTIONS: 97110-Therapeutic exercises, 97530- Therapeutic activity, [id number]- Neuromuscular re-education, 97535- Self Care, 02859- Manual therapy, [phone]- Gait training, [phone]- Splinting, Patient/Family education, Balance training, Stair training, Taping, Joint mobilization, Joint manipulation, DME instructions, Wheelchair mobility training, Cryotherapy, and Moist heat  PLAN FOR NEXT SESSION:   Continued POC Improved activity tolerance and BLE strengthening  High level balance training.   Massie FORBES Dollar, PT 09/21/2023, 1:48 PM

## 2023-09-23 ENCOUNTER — Ambulatory Visit: Admission: RE | Admit: 2023-09-23 | Payer: Medicare Other | Source: Ambulatory Visit

## 2023-09-23 ENCOUNTER — Ambulatory Visit: Payer: Medicare Other | Admitting: Speech Pathology

## 2023-09-23 ENCOUNTER — Telehealth: Payer: Self-pay | Admitting: Speech Pathology

## 2023-09-23 NOTE — Telephone Encounter (Signed)
 Pt was a no show/no call for his 1015 MBSS and to his 1100 ST session today.   This clinical research associate called and left a voice message with pt.   Exa Bomba B. Rubbie, M.S., CCC-SLP, Tree Surgeon Certified Brain Injury Specialist [facility]  [facility] Rehabilitation Services Office [phone] Ascom [phone] Fax [phone]

## 2023-09-26 ENCOUNTER — Ambulatory Visit: Payer: Medicare Other | Admitting: Speech Pathology

## 2023-09-26 ENCOUNTER — Ambulatory Visit: Payer: Medicare Other | Admitting: Physical Therapy

## 2023-09-26 DIAGNOSIS — R1312 Dysphagia, oropharyngeal phase: Secondary | ICD-10-CM

## 2023-09-26 DIAGNOSIS — R471 Dysarthria and anarthria: Secondary | ICD-10-CM

## 2023-09-26 DIAGNOSIS — M6281 Muscle weakness (generalized): Secondary | ICD-10-CM

## 2023-09-26 DIAGNOSIS — R41841 Cognitive communication deficit: Secondary | ICD-10-CM

## 2023-09-26 DIAGNOSIS — R2689 Other abnormalities of gait and mobility: Secondary | ICD-10-CM

## 2023-09-26 NOTE — Therapy (Signed)
 OUTPATIENT SPEECH LANGUAGE PATHOLOGY  PARKINSON'S TREATMENT NOTE   Patient Name: Edgar Huynh MRN: [id number] DOB:08/19/1955, 68 y.o., male Today's Date: 09/26/2023  PCP: Lenell Query REFERRING PROVIDER: Devora Folks, MD   End of Session - 09/26/23 1130     Visit Number 6    Number of Visits 25    Date for SLP Re-Evaluation 11/21/23    Authorization Type [employer]    Authorization Time Period 09/05/2023 thur 11/28/2023    Authorization - Visit Number 6    Authorization - Number of Visits 24    Progress Note Due on Visit 10    SLP Start Time 1145    SLP Stop Time  1230    SLP Time Calculation (min) 45 min    Activity Tolerance Patient tolerated treatment well              Past Medical History:  Diagnosis Date   Anxiety    COPD (chronic obstructive pulmonary disease) (HCC)    Depression    GERD (gastroesophageal reflux disease)    Glaucoma    History of colonic polyps    History of kidney stones    Hypercholesteremia    Hyperlipidemia    Hypertension    Neuropathy    Osteoarthritis of left knee    Sleep apnea    Stroke (HCC) 12/10/1996   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   TIA (transient ischemic attack)    Type 1 diabetes (HCC)    on insulin  pump   Past Surgical History:  Procedure Laterality Date   CAROTID ARTERY ANGIOPLASTY Right 10/18/2004   CATARACT EXTRACTION, BILATERAL Bilateral    COLONOSCOPY     COLONOSCOPY WITH PROPOFOL  N/A 08/25/2018   Procedure: COLONOSCOPY WITH PROPOFOL ;  Surgeon: Cassie Click, MD;  Location: [facility] ENDOSCOPY;  Service: Endoscopy;  Laterality: N/A;   CYSTOSCOPY     ESOPHAGOGASTRODUODENOSCOPY     ESOPHAGOGASTRODUODENOSCOPY N/A 09/01/2022   Procedure: ESOPHAGOGASTRODUODENOSCOPY (EGD);  Surgeon: Toledo, Alphonsus Jeans, MD;  Location: [facility] ENDOSCOPY;  Service: Gastroenterology;  Laterality: N/A;   ESOPHAGOGASTRODUODENOSCOPY (EGD) WITH PROPOFOL  N/A 08/25/2018   Procedure:  ESOPHAGOGASTRODUODENOSCOPY (EGD) WITH PROPOFOL ;  Surgeon: Cassie Click, MD;  Location: [facility] ENDOSCOPY;  Service: Endoscopy;  Laterality: N/A;   EYE SURGERY     PARTIAL KNEE ARTHROPLASTY Left 01/05/2022   Procedure: UNICOMPARTMENTAL KNEE;  Surgeon: Elner Hahn, MD;  Location: [facility] ORS;  Service: Orthopedics;  Laterality: Left;   WISDOM TOOTH EXTRACTION     Patient Active Problem List   Diagnosis Date Noted   DKA (diabetic ketoacidosis) (HCC) 02/11/2023   Known medical problems 02/11/2023   Colitis 02/11/2023   SOB (shortness of breath) 04/09/2021   HLD (hyperlipidemia) 04/09/2021   HTN (hypertension) 04/09/2021   Stroke (HCC) 04/09/2021   Chest pain 04/09/2021   Depression with anxiety 04/09/2021   Anxiety disorder 09/23/2017   MDD (major depressive disorder), single episode, severe with psychotic features (HCC) 03/01/2017   Adjustment disorder with mixed anxiety and depressed mood 02/28/2017   Psychosis, affective (HCC) 02/28/2017   Diabetic retinopathy (HCC) 10/03/2012   GERD (gastroesophageal reflux disease) 04/04/2012   Diabetic neuropathy (HCC) 04/04/2012   DM (diabetes mellitus) type I, controlled, with peripheral vascular disorder (HCC) 02/04/2009   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 Current working diagnosis of Parkinson's Disease with concern for facial drooping, difficulty swallowing, hypophonia, and family history of  Parkinson's disease. Pt also experiences severe depression as well as complex motor tics d/t suspicion for longstanding tic disorder with component of tardive dyskinesia in setting of neuroleptics (Ability, risperidone).   DIAGNOSTIC FINDINGS:   Modified Barium Swallow Study 06/02/2018 "Mild pharyngeal dysphagia characterized by delayed pharyngeal swallow initiation, reduced pharyngeal pressure generation, mild pharyngeal residue, transient laryngeal penetration (nectar-thick and thin liquids), and aspiration X1." Of note, report documents "pt compliant of weak voice" recommend ENT follow up  MR Brain w wo contrast 07/05/2023 - No acute intracranial process. No evidence of acute or subacute  infarct.   Single fiber EMG with [facility] - scheduled 10/12/2023   PAIN:  Are you having pain? No  FALLS: Has patient fallen in last 6 months?  See PT evaluation for details  LIVING ENVIRONMENT: Lives with: lives with their spouse Lives in: House/apartment  PLOF:  Level of assistance: Independent with ADLs, Independent with IADLs Employment: On disability  PATIENT GOALS    to improve speech and swallow function  SUBJECTIVE STATEMENT: Pt states that he overslept on Friday missing his Modified Barium Swallow Study and ST session Pt accompanied by: self  OBJECTIVE:   TODAY'S TREATMENT:  Skilled treatment session focused on pt's dysphagia and dysarthria. SLP facilitated session by providing the following interventions:   Skilled verbal and written education provided to pt on general aspiration precautions including sitting upright at table, reducing distractions (no TV, no talking, no phone), small sips/bites, slow rate of consumption, remain upright for at least 30 minutes after consuming food or liquid - pt reports that all of these are things that he is not currently doing which increase his aspiration risk - he currently states that he coughs when consuming meals sitting on the couch eating from  an "end table." Pt to if these strategies are helpful in reducing his coughing. Skilled observation was provided of pt consuming thin liquids via cup with no overt s/s of aspiration during today's session.   Pt states that he has not his dysarthria (speech) homework everyday as instructed. Recommend that pt set alarms to help remember to complete. Pt agreeable to recommendation.    Training provided in the Six SPEAK OUT! Exercises   WARM-UP   (using "May---Me---My---Moe---Moo") Smooth Connected Speech- Moderate A Every syllable with INTENT-  Moderate A Typically produced at 85-90 dB- Moderate A to achieve 85 dB   AH Mouth opened wide- Moderate A Duration (not more than 10 seconds)-  Min A to achieve 7 seconds Volume consistent throughout- Maximal A Clear vocal quality throughout- Maximal A Typically produced at 85-90 dB- Moderate A to achieve 85 dB   GLIDES Mouth opened wide- Supervision A Steady "ah," then glide up- Maximal A  Steady "ah," then glide down-  Maximal A  Clear vocal quality throughout- Maximal A  Typically produced at 85-90dB- Moderate A to achieve 85 dB   COUNTING  Smooth and connected- Moderate A  Neither mono-pitch nor chanted- Rare Min A  Projected up and forward- Moderate A  Every number exaggerated- Min A   Typically produced at 80-85 dB-  Moderate A  to achieve 83  dB   READING Be deliberate- Moderate A  Projected up and forward- Moderate A  Every single word with INTENT- Moderate A  Typically produced at 75-85 dB- Moderate A  to achieve 70 dB   CONVERSATION  Begin with structured tasks   Every single word with INTENT- Moderate A      PATIENT EDUCATION: Education details: see above Person educated: Patient Education method: Explanation Education comprehension: needs further education   HOME EXERCISE PROGRAM: Speak Out assignments   GOALS: Goals reviewed with patient? Yes  SHORT TERM GOALS: Target date: 10 sessions  With Rare Min A,  patient will demo HEP for motor speech accurately in 5/7 opportunities.  Baseline: Goal status: INITIAL  2.  To determine optimal resistance levels for Respiratory Muscle Training (RMT) for improving increase hyolaryngeal elevation and strengthen cough for airway clearance, patient will participate in evaluation (and re-assessment as needed) of maximum expiratory pressure (MEP). Baseline:  Goal status: INITIAL  3.  With Min A, patient will complete 3 sets of 10 repetitions with EMST set at TBD cmH2O with self-reported effortful of 7 out of 10. Baseline:  Goal status: INITIAL  4.  With Min A, patient will improve speech intelligibility for  phrase by controlling rate of speech, over-articulation, and increased loudness to achieve 75% intelligibility.  Baseline:  Goal status: INITIAL   LONG TERM GOALS: Target date: 11/21/2023  With Supervision A, patient will participate in 15 minutes conversation, maintaining average loudness of 75 dB and loud, good quality voice.   Baseline:  Goal status: INITIAL  2.  Patient will report improved communication effectiveness as measured by Communicative Effectiveness Survey. Baseline:  Goal status: INITIAL  3.  Patient will participate in objective swallowing evaluation (MBSS) to identify safest diet recommendation as well as therapeutic targets.  Baseline:  Goal status: INITIAL  4.  Patient will improve perception of swallowing as indicated by an improvement in EAT-10 score by 12 weeks from initial swallowing therapy session.  Baseline:  Goal status: INITIAL   ASSESSMENT:  CLINICAL IMPRESSION: Patient is a 68 year old male y.o.  who was seen today for a speech language treatment d/t Parkinson's Disease. Pt presents with moderate hypokinetic dysarthria that is c/b reduce articulation, volume, fast rate of speech, breathy hoarse vocal quality as well as report of s/s of potential aspiration with all PO consumption.   Pt continues to report  decreased practice but higher expectations for vocal quality.  See the above treatment note for details.   OBJECTIVE IMPAIRMENTS include expressive language, dysarthria, voice disorder, and dysphagia. These impairments are limiting patient from managing medications, managing appointments, managing finances, household responsibilities, ADLs/IADLs, effectively communicating at home and in community, and safety when swallowing. Factors affecting potential to achieve goals and functional outcome are co-morbidities, medical prognosis, and severity of impairments. Patient will benefit from skilled SLP services to address above impairments and improve overall function.  REHAB POTENTIAL: Good  PLAN: SLP FREQUENCY: 2x/week  SLP DURATION: 12 weeks  PLANNED INTERVENTIONS: Aspiration precaution training, Pharyngeal strengthening exercises, Diet toleration management , Trials of upgraded texture/liquids, Oral motor exercises, Functional tasks, Multimodal communication approach, SLP instruction and feedback, Compensatory strategies, and Patient/family education   Lumi Winslett B. Garlin Junker, M.S., CCC-SLP, Tree surgeon Certified Brain Injury Specialist [facility]  [facility] Rehabilitation Services Office [phone] Ascom [phone] Fax [phone]

## 2023-09-26 NOTE — Therapy (Signed)
 OUTPATIENT PHYSICAL THERAPY NEURO TREATMENT  Patient Name: Edgar Huynh MRN: [id number] DOB:[birth year], 68 y.o., male Today's Date: 09/26/2023   PCP: Lenell Query, PA-C  REFERRING PROVIDER: Lenell Query, PA-C   END OF SESSION:  PT End of Session - 09/26/23 1104     Visit Number 8    Number of Visits 24    Date for PT Re-Evaluation 11/23/23    PT Start Time 1105    PT Stop Time 1145    PT Time Calculation (min) 40 min    Equipment Utilized During Treatment Gait belt    Activity Tolerance Patient tolerated treatment well    Behavior During Therapy WFL for tasks assessed/performed             Past Medical History:  Diagnosis Date   Anxiety    COPD (chronic obstructive pulmonary disease) (HCC)    Depression    GERD (gastroesophageal reflux disease)    Glaucoma    History of colonic polyps    History of kidney stones    Hypercholesteremia    Hyperlipidemia    Hypertension    Neuropathy    Osteoarthritis of left knee    Sleep apnea    Stroke (HCC) 12/10/1996   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   TIA (transient ischemic attack)    Type 1 diabetes (HCC)    on insulin  pump   Past Surgical History:  Procedure Laterality Date   CAROTID ARTERY ANGIOPLASTY Right 10/18/2004   CATARACT EXTRACTION, BILATERAL Bilateral    COLONOSCOPY     COLONOSCOPY WITH PROPOFOL  N/A 08/25/2018   Procedure: COLONOSCOPY WITH PROPOFOL ;  Surgeon: Cassie Click, MD;  Location: [facility] ENDOSCOPY;  Service: Endoscopy;  Laterality: N/A;   CYSTOSCOPY     ESOPHAGOGASTRODUODENOSCOPY     ESOPHAGOGASTRODUODENOSCOPY N/A 09/01/2022   Procedure: ESOPHAGOGASTRODUODENOSCOPY (EGD);  Surgeon: Toledo, Alphonsus Jeans, MD;  Location: [facility] ENDOSCOPY;  Service: Gastroenterology;  Laterality: N/A;   ESOPHAGOGASTRODUODENOSCOPY (EGD) WITH PROPOFOL  N/A 08/25/2018   Procedure: ESOPHAGOGASTRODUODENOSCOPY (EGD) WITH PROPOFOL ;  Surgeon: Cassie Click, MD;  Location: [facility]  ENDOSCOPY;  Service: Endoscopy;  Laterality: N/A;   EYE SURGERY     PARTIAL KNEE ARTHROPLASTY Left 01/05/2022   Procedure: UNICOMPARTMENTAL KNEE;  Surgeon: Elner Hahn, MD;  Location: [facility] ORS;  Service: Orthopedics;  Laterality: Left;   WISDOM TOOTH EXTRACTION     Patient Active Problem List   Diagnosis Date Noted   DKA (diabetic ketoacidosis) (HCC) 02/11/2023   Known medical problems 02/11/2023   Colitis 02/11/2023   SOB (shortness of breath) 04/09/2021   HLD (hyperlipidemia) 04/09/2021   HTN (hypertension) 04/09/2021   Stroke (HCC) 04/09/2021   Chest pain 04/09/2021   Depression with anxiety 04/09/2021   Anxiety disorder 09/23/2017   MDD (major depressive disorder), single episode, severe with psychotic features (HCC) 03/01/2017   Adjustment disorder with mixed anxiety and depressed mood 02/28/2017   Psychosis, affective (HCC) 02/28/2017   Diabetic retinopathy (HCC) 10/03/2012   GERD (gastroesophageal reflux disease) 04/04/2012   Diabetic neuropathy (HCC) 04/04/2012   DM (diabetes mellitus) type I, controlled, with peripheral vascular disorder (HCC) 02/04/2009   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 A little tired, but not as bad as usual because he has started adjusting his medication to take in the afternoon/evening because they are making him tired.   PT encouraged to to talk with Neurologist about  adjusting medication timing to reduce risk of adverse drug interactions and side effects.   Pt accompanied by: self  PERTINENT HISTORY:   From Neurologist "Mr. Halm mentioned that he is taking his medications as prescribed. 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   Concern for myasthenia gravis vs. parkinsonism in patient presenting with multiple neurological symptoms of facial drooping, difficulty swallowing, decrease speech volume, stiffness and short gait, shortness of breath and coughing. Patient states the symptoms started in 04/2023. Patient denise fluctuating droopy eyelids, starting new medications around the time of onset of symptoms. Patient does have a positive family history of Parkinson's disease in father. Negative for acute stroke, per 06/2023 MRI"   PAIN:  Are you having pain? No and occasional knee pain with falls >6 months ago   PRECAUTIONS: None  RED FLAGS: None   WEIGHT BEARING RESTRICTIONS: No  FALLS: Has patient fallen in last 6 months? No and several near falls  poor corrections of anterior LOB   LIVING ENVIRONMENT: Lives with: lives with their spouse Lives in: House/apartment Stairs: Yes: Internal: 13 steps; on left going up and External: 2 steps; on left going up Has following equipment at home: None  PLOF: Independent, Independent with basic ADLs, Independent with gait, and Independent with transfers  PATIENT GOALS: improve groggy drowsy feeling. Improve endurance and strength.   OBJECTIVE:  Note: Objective measures were completed at Evaluation unless otherwise noted.  DIAGNOSTIC FINDINGS:   MRI 11/19  IMPRESSION: Scattered and confluent T2 hyperintense signal in the periventricular white matter, likely the sequela of moderate chronic small vessel ischemic disease. Remote lacunar infarcts in the pons and left caudate. No acute intracranial process. No evidence of acute or  subacute infarct.  COGNITION: Overall cognitive status: Within functional limits for tasks assessed   SENSATION: WFL States increased sensitivity in BLE  COORDINATION: Dysdidodactic kinesia: WFL  Finger ot nose WFL  Ankle to knee: Mild decreased speed on the L and mild under shooting.    EDEMA:  WFL   MUSCLE TONE:  Grossly WFL  POSTURE: rounded shoulders and forward head  LOWER EXTREMITY ROM:     WFL  LOWER EXTREMITY MMT:    MMT Right Eval Left Eval  Hip flexion 4- 4-  Hip extension    Hip abduction 4- 4-  Hip adduction 4 4  Hip internal rotation    Hip external rotation    Knee flexion 4 4  Knee extension 4+ 4+  Ankle dorsiflexion 4+ 4+  Ankle plantarflexion    Ankle inversion    Ankle eversion    (Blank rows = not tested)  BED MOBILITY:  Sit to supine Complete Independence Supine to sit Complete Independence  TRANSFERS: Assistive device utilized: None  Sit to stand: Complete Independence Stand to sit: Complete Independence Chair to chair: Complete Independence Floor:  TBD  RAMP:  Level of Assistance: Complete Independence Assistive device utilized: None Ramp Comments:   CURB:  Level of Assistance: Complete Independence Assistive device utilized: None Curb Comments:   STAIRS: Level of Assistance: Complete Independence Stair Negotiation Technique: Alternating Pattern  with No Rails Number of Stairs: 4  Height of Stairs: 6  Comments: WFL  GAIT: Gait pattern:   , step through pattern, decreased arm swing- Right, decreased arm swing- Left, decreased trunk rotation, and trunk flexed Distance walked: 167ft Assistive device utilized: None Level of assistance: Complete Independence Comments:   FUNCTIONAL TESTS:  5 times sit to stand: 11.51 Timed up and go (TUG): 8.25 6 minute walk test: 619ft 10 meter walk test: 7.9 and 8.3  Berg Balance Scale: 49 Functional gait assessment: 25  PATIENT SURVEYS:  FOTO 53                                                                                                                               TREATMENT DATE: 09/14/2023  Octane BLE reciprocal movement training x 5 min for neurologic priming. Level 1-3 with cues for SPC >20, with difficulty maintain step speed with conversation   Donned 3# AW .  Standing march x 12 bil Standin hip abduction x 12 bil Standing hip extension x 12   Standing on airex pad: 1 foot on pad 1 foot on pad on step 2 x 20 sec hold  Lateral foot tap from pad to pad on step x 12 bil  Cues to improve gluteal activation in stance.   Stepping over half bolster.  Forward/reverse x 8 bil  Side stepping R and L 12 bil   Gait with 3# AW through rehab department 2 x 467ft. Noted to have mild foot drag on BLE when fatigued on each bout.   Pt performed all tasks in standing with CGA and gail belt in place to reduce fall risk unless otherwise noted.    PATIENT EDUCATION: Education details: POC, HEP  Person educated: Patient Education method: Explanation Education comprehension: verbalized understanding  HOME EXERCISE PROGRAM: Access Code: [email or web address] URL: [email or web address] Date: 09/05/2023 Prepared by: Aurora Lees  Exercises - Sit to Stand  - 1 x daily - 2-3 x weekly - 2 sets - 10 reps - Standing Tandem Balance with Counter Support  - 1 x daily - 2-3 x weekly - 4 sets - 10 reps - 15 hold - Single Leg Stance with Support  - 1 x daily - 2-3 x weekly - 4 sets - 10 reps - 10 hold - Standing Hip Abduction with Counter Support  - 1 x daily - 2-3 x weekly - 2 sets - 10 reps - Standing March with Counter Support  - 1 x daily - 2-3 x weekly - 2 sets - 10 reps  GOALS: Goals reviewed with patient? Yes   SHORT TERM GOALS: Target date: 09/29/2023    Patient will be independent in home exercise program to improve strength/mobility for better functional independence with ADLs. Baseline: to be given at session 2.  Goal status: INITIAL   LONG TERM  GOALS: Target date: 11/24/2023    Patient will increase FOTO score  by equal to or greater than  4 points  to demonstrate statistically significant improvement in mobility and quality  of life.  Baseline: 53 Goal status: INITIAL  2.  Patient (> 88 years old) will improve SLS by < 15 seconds indicating an increased LE strength and improved balance. Baseline: 4 sec bil Goal status: INITIAL  3.  Patient will increase Berg Balance score by > 4 points to demonstrate decreased fall risk during functional activities Baseline: 49 Goal status: INITIAL  4.  Patient will increase 6 min walk test by 135ft  as to improve gait speed for better community ambulation and to reduce fall risk. Baseline: 624ft for 3:21min  Goal status: INITIAL   6.  Patient will increase FGA score to >27 as to demonstrate reduced fall risk and improved dynamic gait balance for better safety with community/home ambulation.   Baseline: 25 Goal status: INITIAL   ASSESSMENT:  CLINICAL IMPRESSION: Treatment focused on dynamic balance  and gait training. With emphasis on improved amplitude of movement and ankle control on unlevel surface. Pt demonstrates improved balance on unlevel surface and full step length with bolster forward/reverse. Pt will benefit from skilled to PT improve function, reduce fall risk, and return to PLOF.    OBJECTIVE IMPAIRMENTS: Abnormal gait, cardiopulmonary status limiting activity, decreased activity tolerance, decreased balance, decreased cognition, decreased endurance, decreased mobility, increased fascial restrictions, and increased muscle spasms.   ACTIVITY LIMITATIONS: carrying, lifting, bending, stairs, transfers, and locomotion level  PARTICIPATION LIMITATIONS: community activity and yard work  PERSONAL FACTORS: Age, Fitness, and 1-2 comorbidities: CVA and PD vs myasthenia gravis   are also affecting patient's functional outcome.   REHAB POTENTIAL: Excellent  CLINICAL DECISION  MAKING: Stable/uncomplicated  EVALUATION COMPLEXITY: Low  PLAN:  PT FREQUENCY: 1-2x/week  PT DURATION: 12 weeks  PLANNED INTERVENTIONS: 97110-Therapeutic exercises, 97530- Therapeutic activity, [id number]- Neuromuscular re-education, 97535- Self Care, 96045- Manual therapy, [phone]- Gait training, [phone]- Splinting, Patient/Family education, Balance training, Stair training, Taping, Joint mobilization, Joint manipulation, DME instructions, Wheelchair mobility training, Cryotherapy, and Moist heat  PLAN FOR NEXT SESSION:   Continued POC Improved activity tolerance and BLE strengthening  High level balance training.   Barbara Book, PT 09/26/2023, 11:05 AM

## 2023-09-28 ENCOUNTER — Ambulatory Visit: Payer: Medicare Other | Admitting: Physical Therapy

## 2023-09-28 ENCOUNTER — Ambulatory Visit: Payer: Medicare Other | Admitting: Speech Pathology

## 2023-09-28 DIAGNOSIS — M6281 Muscle weakness (generalized): Secondary | ICD-10-CM

## 2023-09-28 DIAGNOSIS — R471 Dysarthria and anarthria: Secondary | ICD-10-CM

## 2023-09-28 DIAGNOSIS — R2689 Other abnormalities of gait and mobility: Secondary | ICD-10-CM

## 2023-09-28 NOTE — Therapy (Signed)
OUTPATIENT PHYSICAL THERAPY NEURO TREATMENT  Patient Name: Edgar Huynh MRN: [id number] DOB:1956-02-07, 68 y.o., male Today's Date: 09/28/2023   PCP: Wilford Corner, PA-C  REFERRING PROVIDER: Wilford Corner, PA-C   END OF SESSION:  PT End of Session - 09/28/23 0848     Visit Number 9    Number of Visits 24    Date for PT Re-Evaluation 11/23/23    PT Start Time 0848    PT Stop Time 0930    PT Time Calculation (min) 42 min    Equipment Utilized During Treatment Gait belt    Activity Tolerance Patient tolerated treatment well    Behavior During Therapy WFL for tasks assessed/performed             Past Medical History:  Diagnosis Date   Anxiety    COPD (chronic obstructive pulmonary disease) (HCC)    Depression    GERD (gastroesophageal reflux disease)    Glaucoma    History of colonic polyps    History of kidney stones    Hypercholesteremia    Hyperlipidemia    Hypertension    Neuropathy    Osteoarthritis of left knee    Sleep apnea    Stroke (HCC) 12/10/1996   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   TIA (transient ischemic attack)    Type 1 diabetes (HCC)    on insulin pump   Past Surgical History:  Procedure Laterality Date   CAROTID ARTERY ANGIOPLASTY Right 10/18/2004   CATARACT EXTRACTION, BILATERAL Bilateral    COLONOSCOPY     COLONOSCOPY WITH PROPOFOL N/A 08/25/2018   Procedure: COLONOSCOPY WITH PROPOFOL;  Surgeon: Scot Jun, MD;  Location: [facility] ENDOSCOPY;  Service: Endoscopy;  Laterality: N/A;   CYSTOSCOPY     ESOPHAGOGASTRODUODENOSCOPY     ESOPHAGOGASTRODUODENOSCOPY N/A 09/01/2022   Procedure: ESOPHAGOGASTRODUODENOSCOPY (EGD);  Surgeon: Toledo, Boykin Nearing, MD;  Location: [facility] ENDOSCOPY;  Service: Gastroenterology;  Laterality: N/A;   ESOPHAGOGASTRODUODENOSCOPY (EGD) WITH PROPOFOL N/A 08/25/2018   Procedure: ESOPHAGOGASTRODUODENOSCOPY (EGD) WITH PROPOFOL;  Surgeon: Scot Jun, MD;  Location: [facility]  ENDOSCOPY;  Service: Endoscopy;  Laterality: N/A;   EYE SURGERY     PARTIAL KNEE ARTHROPLASTY Left 01/05/2022   Procedure: UNICOMPARTMENTAL KNEE;  Surgeon: Christena Flake, MD;  Location: [facility] ORS;  Service: Orthopedics;  Laterality: Left;   WISDOM TOOTH EXTRACTION     Patient Active Problem List   Diagnosis Date Noted   DKA (diabetic ketoacidosis) (HCC) 02/11/2023   Known medical problems 02/11/2023   Colitis 02/11/2023   SOB (shortness of breath) 04/09/2021   HLD (hyperlipidemia) 04/09/2021   HTN (hypertension) 04/09/2021   Stroke (HCC) 04/09/2021   Chest pain 04/09/2021   Depression with anxiety 04/09/2021   Anxiety disorder 09/23/2017   MDD (major depressive disorder), single episode, severe with psychotic features (HCC) 03/01/2017   Adjustment disorder with mixed anxiety and depressed mood 02/28/2017   Psychosis, affective (HCC) 02/28/2017   Diabetic retinopathy (HCC) 10/03/2012   GERD (gastroesophageal reflux disease) 04/04/2012   Diabetic neuropathy (HCC) 04/04/2012   DM (diabetes mellitus) type I, controlled, with peripheral vascular disorder (HCC) 02/04/2009   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 No other medical updates on this day.   Pt accompanied by: self  PERTINENT HISTORY:   From Neurologist "Mr. Kutter mentioned that he is taking his medications as prescribed. 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   Concern for myasthenia gravis vs. parkinsonism in patient presenting with multiple neurological symptoms of facial drooping, difficulty swallowing, decrease speech volume, stiffness and short gait, shortness of breath and coughing. Patient states the symptoms started in 04/2023. Patient denise fluctuating droopy eyelids, starting new medications around the time of onset of symptoms. Patient does have a positive family history of Parkinson's disease in father. Negative for acute stroke, per 06/2023 MRI"   PAIN:  Are you having pain? No and occasional knee pain with falls >6 months ago   PRECAUTIONS: None  RED FLAGS: None   WEIGHT BEARING RESTRICTIONS: No  FALLS: Has patient fallen in last 6 months? No and several near falls  poor corrections of anterior LOB   LIVING ENVIRONMENT: Lives with: lives with their spouse Lives in: House/apartment Stairs: Yes: Internal: 13 steps; on left going up and External: 2 steps; on left going up Has following equipment at home: None  PLOF: Independent, Independent with basic ADLs, Independent with gait, and Independent with transfers  PATIENT GOALS: improve groggy drowsy feeling. Improve endurance and strength.   OBJECTIVE:  Note: Objective measures were completed at Evaluation unless otherwise noted.  DIAGNOSTIC FINDINGS:   MRI 11/19  IMPRESSION: Scattered and confluent T2 hyperintense signal in the periventricular white matter, likely the sequela of moderate chronic small vessel ischemic disease. Remote lacunar infarcts in the pons and left caudate. No acute intracranial process. No evidence of acute or subacute infarct.  COGNITION: Overall cognitive status: Within functional limits for tasks assessed   SENSATION: WFL States increased sensitivity in BLE  COORDINATION: Dysdidodactic kinesia: WFL  Finger ot nose WFL  Ankle to knee: Mild decreased speed on the L and  mild under shooting.    EDEMA:  WFL   MUSCLE TONE:  Grossly WFL  POSTURE: rounded shoulders and forward head  LOWER EXTREMITY ROM:     WFL  LOWER EXTREMITY MMT:    MMT Right Eval Left Eval  Hip flexion 4- 4-  Hip extension    Hip abduction 4- 4-  Hip adduction 4 4  Hip internal rotation    Hip external rotation    Knee flexion 4 4  Knee extension 4+ 4+  Ankle dorsiflexion 4+ 4+  Ankle plantarflexion    Ankle inversion    Ankle eversion    (Blank rows = not tested)  BED MOBILITY:  Sit to supine Complete Independence Supine to sit Complete Independence  TRANSFERS: Assistive device utilized: None  Sit to stand: Complete Independence Stand to sit: Complete Independence Chair to chair: Complete Independence Floor:  TBD  RAMP:  Level of Assistance: Complete Independence Assistive device utilized: None Ramp Comments:   CURB:  Level of Assistance: Complete Independence Assistive device utilized: None Curb Comments:   STAIRS: Level of Assistance: Complete Independence Stair Negotiation Technique: Alternating Pattern  with No Rails Number of Stairs: 4  Height of Stairs: 6  Comments: WFL  GAIT: Gait pattern:   , step through pattern, decreased arm swing- Right, decreased arm swing- Left, decreased trunk rotation, and trunk flexed Distance walked: 179ft Assistive device utilized: None Level of assistance: Complete Independence Comments:   FUNCTIONAL TESTS:  5 times sit to stand: 11.51 Timed up and go (TUG): 8.25 6 minute walk test: 648ft 10 meter walk test: 7.9 and 8.3  Berg Balance Scale: 49 Functional gait assessment: 25  PATIENT SURVEYS:  FOTO 53                                                                                                                              TREATMENT DATE: 09/14/2023  Nustep BLE reciprocal movement training x 5 min for neurologic priming. Level 1 with cues for SPM >45, improved consistency with SPM on this day    SLS 2 x 15sec bil  Tandem stance 2 x 20 sec bil  Standing on narrow airex  2 x 30 sec AP control  Heel raise with forefoot on narrow airex beam x 15  Toe raise with heel on airex beam x 15  Diagonal steps over narrow beam; forward/reverse x 3. Cues for improved step width.      Tandem gait across narrow airex beam x  Standing on narrow airex beam cross body reach x 15 bil   Gait without AD ~559ftx 2; 3 min without resistance. Increased foot drag with one mild LOB on first bout. Foot drop noted for last 287ft of each bout. Mild SOB. RPE 4/10   Pt performed all tasks in standing with CGA and gail belt in place to reduce fall risk unless otherwise noted.    PATIENT EDUCATION: Education details: POC, HEP  Person educated: Patient Education method: Explanation Education comprehension: verbalized understanding  HOME EXERCISE PROGRAM: Access Code: [email or web address] URL: [email or web address] Date: 09/05/2023 Prepared by: Grier Rocher  Exercises - Sit to Stand  - 1 x daily - 2-3 x weekly - 2 sets - 10 reps - Standing Tandem Balance with Counter Support  - 1 x daily - 2-3 x weekly - 4 sets - 10 reps - 15 hold - Single Leg Stance with Support  - 1 x daily - 2-3 x weekly - 4 sets - 10 reps - 10 hold - Standing Hip Abduction with Counter Support  - 1 x daily - 2-3 x weekly - 2 sets - 10 reps - Standing March with Counter Support  - 1 x daily - 2-3 x weekly - 2 sets - 10 reps  GOALS: Goals reviewed with patient? Yes   SHORT TERM GOALS: Target date: 09/29/2023    Patient will be independent in home exercise program to improve strength/mobility for better functional independence with ADLs. Baseline: to be given at session 2.  Goal status: INITIAL   LONG TERM GOALS: Target date: 11/24/2023    Patient will increase FOTO score  by equal to or greater than  4 points  to demonstrate statistically significant improvement in mobility and quality of life.  Baseline: 53 Goal  status: INITIAL  2.  Patient (> 26 years old) will improve SLS by < 15 seconds indicating an increased LE strength and improved balance. Baseline: 4  sec bil Goal status: INITIAL  3.  Patient will increase Berg Balance score by > 4 points to demonstrate decreased fall risk during functional activities Baseline: 49 Goal status: INITIAL  4.  Patient will increase 6 min walk test by 156ft  as to improve gait speed for better community ambulation and to reduce fall risk. Baseline: 669ft for 3:29min  Goal status: INITIAL   6.  Patient will increase FGA score to >27 as to demonstrate reduced fall risk and improved dynamic gait balance for better safety with community/home ambulation.   Baseline: 25 Goal status: INITIAL   ASSESSMENT:  CLINICAL IMPRESSION: Treatment focused on dynamic balance and gait training. With emphasis on improved weight shift and step length in multiple planes. Mild low back pain reported following cross body reaches, that resolves with seated rest break. Fatigue increases foot drag with long distance gait. Pt demonstrates improved balance on unlevel surface and full step length with bolster forward/reverse. Pt will benefit from skilled to PT improve function, reduce fall risk, and return to PLOF.    OBJECTIVE IMPAIRMENTS: Abnormal gait, cardiopulmonary status limiting activity, decreased activity tolerance, decreased balance, decreased cognition, decreased endurance, decreased mobility, increased fascial restrictions, and increased muscle spasms.   ACTIVITY LIMITATIONS: carrying, lifting, bending, stairs, transfers, and locomotion level  PARTICIPATION LIMITATIONS: community activity and yard work  PERSONAL FACTORS: Age, Fitness, and 1-2 comorbidities: CVA and PD vs myasthenia gravis   are also affecting patient's functional outcome.   REHAB POTENTIAL: Excellent  CLINICAL DECISION MAKING: Stable/uncomplicated  EVALUATION COMPLEXITY: Low  PLAN:  PT FREQUENCY:  1-2x/week  PT DURATION: 12 weeks  PLANNED INTERVENTIONS: 97110-Therapeutic exercises, 97530- Therapeutic activity, [id number]- Neuromuscular re-education, 97535- Self Care, 09811- Manual therapy, [phone]- Gait training, [phone]- Splinting, Patient/Family education, Balance training, Stair training, Taping, Joint mobilization, Joint manipulation, DME instructions, Wheelchair mobility training, Cryotherapy, and Moist heat  PLAN FOR NEXT SESSION:   Continued POC Improved activity tolerance and BLE strengthening  High level balance training.   Golden Pop, PT 09/28/2023, 8:51 AM

## 2023-09-28 NOTE — Therapy (Signed)
OUTPATIENT SPEECH LANGUAGE PATHOLOGY  PARKINSON'S TREATMENT NOTE   Patient Name: Edgar Huynh MRN: [id number] DOB:[birth year], 68 y.o., male Today's Date: 09/28/2023  PCP: Wilford Corner REFERRING PROVIDER: Cristopher Peru, MD   End of Session - 09/28/23 0934     Visit Number 7    Number of Visits 25    Date for SLP Re-Evaluation 11/21/23    Authorization Type [employer]    Authorization Time Period 09/05/2023 thur 11/28/2023    Authorization - Visit Number 7    Authorization - Number of Visits 24    Progress Note Due on Visit 10    SLP Start Time 0930    SLP Stop Time  1015    SLP Time Calculation (min) 45 min    Activity Tolerance Patient tolerated treatment well              Past Medical History:  Diagnosis Date   Anxiety    COPD (chronic obstructive pulmonary disease) (HCC)    Depression    GERD (gastroesophageal reflux disease)    Glaucoma    History of colonic polyps    History of kidney stones    Hypercholesteremia    Hyperlipidemia    Hypertension    Neuropathy    Osteoarthritis of left knee    Sleep apnea    Stroke (HCC) 12/10/1996   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   TIA (transient ischemic attack)    Type 1 diabetes (HCC)    on insulin pump   Past Surgical History:  Procedure Laterality Date   CAROTID ARTERY ANGIOPLASTY Right 10/18/2004   CATARACT EXTRACTION, BILATERAL Bilateral    COLONOSCOPY     COLONOSCOPY WITH PROPOFOL N/A 08/25/2018   Procedure: COLONOSCOPY WITH PROPOFOL;  Surgeon: Scot Jun, MD;  Location: [facility] ENDOSCOPY;  Service: Endoscopy;  Laterality: N/A;   CYSTOSCOPY     ESOPHAGOGASTRODUODENOSCOPY     ESOPHAGOGASTRODUODENOSCOPY N/A 09/01/2022   Procedure: ESOPHAGOGASTRODUODENOSCOPY (EGD);  Surgeon: Toledo, Boykin Nearing, MD;  Location: [facility] ENDOSCOPY;  Service: Gastroenterology;  Laterality: N/A;   ESOPHAGOGASTRODUODENOSCOPY (EGD) WITH PROPOFOL N/A 08/25/2018   Procedure:  ESOPHAGOGASTRODUODENOSCOPY (EGD) WITH PROPOFOL;  Surgeon: Scot Jun, MD;  Location: [facility] ENDOSCOPY;  Service: Endoscopy;  Laterality: N/A;   EYE SURGERY     PARTIAL KNEE ARTHROPLASTY Left 01/05/2022   Procedure: UNICOMPARTMENTAL KNEE;  Surgeon: Christena Flake, MD;  Location: [facility] ORS;  Service: Orthopedics;  Laterality: Left;   WISDOM TOOTH EXTRACTION     Patient Active Problem List   Diagnosis Date Noted   DKA (diabetic ketoacidosis) (HCC) 02/11/2023   Known medical problems 02/11/2023   Colitis 02/11/2023   SOB (shortness of breath) 04/09/2021   HLD (hyperlipidemia) 04/09/2021   HTN (hypertension) 04/09/2021   Stroke (HCC) 04/09/2021   Chest pain 04/09/2021   Depression with anxiety 04/09/2021   Anxiety disorder 09/23/2017   MDD (major depressive disorder), single episode, severe with psychotic features (HCC) 03/01/2017   Adjustment disorder with mixed anxiety and depressed mood 02/28/2017   Psychosis, affective (HCC) 02/28/2017   Diabetic retinopathy (HCC) 10/03/2012   GERD (gastroesophageal reflux disease) 04/04/2012   Diabetic neuropathy (HCC) 04/04/2012   DM (diabetes mellitus) type I, controlled, with peripheral vascular disorder (HCC) 02/04/2009   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 Current working diagnosis of Parkinson's Disease with concern for facial drooping, difficulty swallowing, hypophonia, and family history of Parkinson's disease. Pt also experiences severe depression as well as  complex motor tics d/t suspicion for longstanding tic disorder with component of tardive dyskinesia in setting of neuroleptics (Ability, risperidone).   DIAGNOSTIC FINDINGS:   Modified Barium Swallow Study 06/02/2018 "Mild pharyngeal dysphagia characterized by delayed pharyngeal swallow initiation, reduced pharyngeal pressure generation, mild pharyngeal residue, transient laryngeal penetration (nectar-thick and thin liquids), and aspiration X1." Of note, report documents "pt compliant of weak voice" recommend ENT follow up  MR Brain w wo contrast 07/05/2023 - No acute intracranial process. No evidence of acute or subacute  infarct.   Single fiber EMG with [facility] - scheduled 10/12/2023   PAIN:  Are you having pain? No  FALLS: Has patient fallen in last 6 months?  See PT evaluation for details  LIVING ENVIRONMENT: Lives with: lives with their spouse Lives in: House/apartment  PLOF:  Level of assistance: Independent with ADLs, Independent with IADLs Employment: On disability  PATIENT GOALS    to improve speech and swallow function  SUBJECTIVE STATEMENT: "Man he wore me out" referring to PT Pt accompanied by: self  OBJECTIVE:   TODAY'S TREATMENT:  Skilled treatment session focused on pt's dysphagia and dysarthria. SLP facilitated session by providing the following interventions:   Pt reports improvement when completing his homework -    Training provided in the Six SPEAK OUT! Exercises   WARM-UP   (using "May---Me---My---Moe---Moo") Smooth Connected Speech- Min A Every syllable with INTENT-  Min A Typically produced at 85-90 dB- Min A to achieve 85 dB   AH Mouth opened wide- Min A Duration (not more than 10 seconds)-  Min A to achieve 7 seconds Volume consistent throughout- Min A Clear vocal quality throughout- Min A Typically produced at 85-90 dB- Min A to achieve 85 dB   GLIDES Mouth opened wide- Supervision A Steady "ah," then glide up- Min A  Steady "ah," then  glide down-  Min A  Clear vocal quality throughout- Min A  Typically produced at 85-90dB- Moderate A to achieve 85 dB   COUNTING  Smooth and connected- Moderate A  Neither mono-pitch nor chanted- Rare Min A  Projected up and forward- Min A  Every number exaggerated- Supervision A   Typically produced at 80-85 dB-  Rare Min A  to achieve 83 dB   READING Be deliberate- Rare Min A  Projected up and forward- Rare Min A  Every single word with INTENT- Rare Min A  Typically produced at 75-85 dB- Rare Min A  to achieve 70 dB   CONVERSATION  Begin with structured tasks Every single word with INTENT- Rare Min A      PATIENT EDUCATION: Education details: see above Person educated: Patient Education method: Explanation Education comprehension: needs further education   HOME EXERCISE PROGRAM: Speak Out assignments   GOALS: Goals reviewed with patient? Yes  SHORT TERM GOALS: Target date: 10 sessions  With Rare Min A, patient will demo HEP for motor speech accurately in 5/7 opportunities.  Baseline: Goal status: INITIAL  2.  To determine optimal resistance levels for Respiratory Muscle Training (RMT) for improving increase hyolaryngeal elevation and strengthen cough for airway clearance, patient will  participate in evaluation (and re-assessment as needed) of maximum expiratory pressure (MEP). Baseline:  Goal status: INITIAL  3.  With Min A, patient will complete 3 sets of 10 repetitions with EMST set at TBD cmH2O with self-reported effortful of 7 out of 10. Baseline:  Goal status: INITIAL  4.  With Min A, patient will improve speech intelligibility for  phrase by controlling rate of speech, over-articulation, and increased loudness to achieve 75% intelligibility.  Baseline:  Goal status: INITIAL   LONG TERM GOALS: Target date: 11/21/2023  With Supervision A, patient will participate in 15 minutes conversation, maintaining average loudness of 75 dB and loud, good quality  voice.   Baseline:  Goal status: INITIAL  2.  Patient will report improved communication effectiveness as measured by Communicative Effectiveness Survey. Baseline:  Goal status: INITIAL  3.  Patient will participate in objective swallowing evaluation (MBSS) to identify safest diet recommendation as well as therapeutic targets.  Baseline:  Goal status: INITIAL  4.  Patient will improve perception of swallowing as indicated by an improvement in EAT-10 score by 12 weeks from initial swallowing therapy session.  Baseline:  Goal status: INITIAL   ASSESSMENT:  CLINICAL IMPRESSION: Patient is a 68 year old male y.o.  who was seen today for a speech language treatment d/t Parkinson's Disease. Pt presents with moderate hypokinetic dysarthria that is c/b reduce articulation, volume, fast rate of speech, breathy hoarse vocal quality as well as report of s/s of potential aspiration with all PO consumption.   Pt with improvement noted during today's activities likely d/t increased completion of HEP. Progress made this week.  See the above treatment note for details.   OBJECTIVE IMPAIRMENTS include expressive language, dysarthria, voice disorder, and dysphagia. These impairments are limiting patient from managing medications, managing appointments, managing finances, household responsibilities, ADLs/IADLs, effectively communicating at home and in community, and safety when swallowing. Factors affecting potential to achieve goals and functional outcome are co-morbidities, medical prognosis, and severity of impairments. Patient will benefit from skilled SLP services to address above impairments and improve overall function.  REHAB POTENTIAL: Good  PLAN: SLP FREQUENCY: 2x/week  SLP DURATION: 12 weeks  PLANNED INTERVENTIONS: Aspiration precaution training, Pharyngeal strengthening exercises, Diet toleration management , Trials of upgraded texture/liquids, Oral motor exercises, Functional tasks,  Multimodal communication approach, SLP instruction and feedback, Compensatory strategies, and Patient/family education   Vianey Caniglia B. Dreama Saa, M.S., CCC-SLP, Tree surgeon Certified Brain Injury Specialist [facility]  [facility] Office [phone] Ascom [phone] Fax [phone]

## 2023-10-03 ENCOUNTER — Ambulatory Visit: Payer: Medicare Other | Admitting: Speech Pathology

## 2023-10-03 ENCOUNTER — Ambulatory Visit: Payer: Medicare Other

## 2023-10-03 DIAGNOSIS — R41841 Cognitive communication deficit: Secondary | ICD-10-CM

## 2023-10-03 DIAGNOSIS — R2689 Other abnormalities of gait and mobility: Secondary | ICD-10-CM

## 2023-10-03 DIAGNOSIS — R471 Dysarthria and anarthria: Secondary | ICD-10-CM

## 2023-10-03 DIAGNOSIS — M6281 Muscle weakness (generalized): Secondary | ICD-10-CM

## 2023-10-03 NOTE — Therapy (Signed)
OUTPATIENT PHYSICAL THERAPY NEURO TREATMENT/PHYSICAL THERAPY PROGRESS NOTE   Dates of reporting period  08/31/23   to   10/03/23   Patient Name: Edgar Huynh MRN: [id number] DOB:05-22-1956, 68 y.o., male Today's Date: 10/03/2023   PCP: Wilford Corner, PA-C  REFERRING PROVIDER: Wilford Corner, PA-C   END OF SESSION:  PT End of Session - 10/03/23 1147     Visit Number 10    Number of Visits 24    Date for PT Re-Evaluation 11/23/23    PT Start Time 1148    PT Stop Time 1230    PT Time Calculation (min) 42 min    Equipment Utilized During Treatment Gait belt    Activity Tolerance Patient tolerated treatment well    Behavior During Therapy WFL for tasks assessed/performed              Past Medical History:  Diagnosis Date   Anxiety    COPD (chronic obstructive pulmonary disease) (HCC)    Depression    GERD (gastroesophageal reflux disease)    Glaucoma    History of colonic polyps    History of kidney stones    Hypercholesteremia    Hyperlipidemia    Hypertension    Neuropathy    Osteoarthritis of left knee    Sleep apnea    Stroke (HCC) 12/10/1996   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   TIA (transient ischemic attack)    Type 1 diabetes (HCC)    on insulin pump   Past Surgical History:  Procedure Laterality Date   CAROTID ARTERY ANGIOPLASTY Right 10/18/2004   CATARACT EXTRACTION, BILATERAL Bilateral    COLONOSCOPY     COLONOSCOPY WITH PROPOFOL N/A 08/25/2018   Procedure: COLONOSCOPY WITH PROPOFOL;  Surgeon: Scot Jun, MD;  Location: [facility] ENDOSCOPY;  Service: Endoscopy;  Laterality: N/A;   CYSTOSCOPY     ESOPHAGOGASTRODUODENOSCOPY     ESOPHAGOGASTRODUODENOSCOPY N/A 09/01/2022   Procedure: ESOPHAGOGASTRODUODENOSCOPY (EGD);  Surgeon: Toledo, Boykin Nearing, MD;  Location: [facility] ENDOSCOPY;  Service: Gastroenterology;  Laterality: N/A;   ESOPHAGOGASTRODUODENOSCOPY (EGD) WITH PROPOFOL N/A 08/25/2018   Procedure:  ESOPHAGOGASTRODUODENOSCOPY (EGD) WITH PROPOFOL;  Surgeon: Scot Jun, MD;  Location: [facility] ENDOSCOPY;  Service: Endoscopy;  Laterality: N/A;   EYE SURGERY     PARTIAL KNEE ARTHROPLASTY Left 01/05/2022   Procedure: UNICOMPARTMENTAL KNEE;  Surgeon: Christena Flake, MD;  Location: [facility] ORS;  Service: Orthopedics;  Laterality: Left;   WISDOM TOOTH EXTRACTION     Patient Active Problem List   Diagnosis Date Noted   DKA (diabetic ketoacidosis) (HCC) 02/11/2023   Known medical problems 02/11/2023   Colitis 02/11/2023   SOB (shortness of breath) 04/09/2021   HLD (hyperlipidemia) 04/09/2021   HTN (hypertension) 04/09/2021   Stroke (HCC) 04/09/2021   Chest pain 04/09/2021   Depression with anxiety 04/09/2021   Anxiety disorder 09/23/2017   MDD (major depressive disorder), single episode, severe with psychotic features (HCC) 03/01/2017   Adjustment disorder with mixed anxiety and depressed mood 02/28/2017   Psychosis, affective (HCC) 02/28/2017   Diabetic retinopathy (HCC) 10/03/2012   GERD (gastroesophageal reflux disease) 04/04/2012   Diabetic neuropathy (HCC) 04/04/2012   DM (diabetes mellitus) type I, controlled, with peripheral vascular disorder (HCC) 02/04/2009   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  Pt accompanied by: self  PERTINENT HISTORY:   From Neurologist "[name]  mentioned that he is taking his medications as prescribed. 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   Concern for myasthenia gravis vs. parkinsonism in patient presenting with multiple neurological symptoms of facial drooping, difficulty swallowing, decrease speech volume, stiffness and short gait, shortness of breath and coughing. Patient states the symptoms started in 04/2023. Patient denise fluctuating droopy eyelids, starting new medications around the time of onset of symptoms. Patient does have a positive family history of Parkinson's disease in father. Negative for acute stroke, per 06/2023 MRI"   PAIN:  Are you having pain? No and occasional knee pain with falls >6 months ago   PRECAUTIONS: None  RED FLAGS: None   WEIGHT BEARING RESTRICTIONS: No  FALLS: Has patient fallen in last 6 months? No and several near falls  poor corrections of anterior LOB   LIVING ENVIRONMENT: Lives with: lives with their spouse Lives in: House/apartment Stairs: Yes: Internal: 13 steps; on left going up and External: 2 steps; on left going up Has following equipment at home: None  PLOF: Independent, Independent with basic ADLs, Independent with gait, and Independent with transfers  PATIENT GOALS: improve groggy drowsy feeling. Improve endurance and strength.   OBJECTIVE:  Note: Objective measures were completed at Evaluation unless otherwise noted.  DIAGNOSTIC FINDINGS:   MRI 11/19  IMPRESSION: Scattered and confluent T2 hyperintense signal in the periventricular white matter, likely the sequela of moderate chronic small vessel ischemic disease. Remote lacunar infarcts in the pons and left caudate. No acute intracranial process. No evidence of acute or subacute infarct.  COGNITION: Overall cognitive status: Within functional limits for tasks assessed   SENSATION: WFL States increased sensitivity in BLE   COORDINATION: Dysdidodactic kinesia: WFL  Finger ot nose WFL  Ankle to knee: Mild decreased speed on the L and mild under shooting.    EDEMA:  WFL   MUSCLE TONE:  Grossly WFL  POSTURE: rounded shoulders and forward head  LOWER EXTREMITY ROM:     WFL  LOWER EXTREMITY MMT:    MMT Right Eval Left Eval  Hip flexion 4- 4-  Hip extension    Hip abduction 4- 4-  Hip adduction 4 4  Hip internal rotation    Hip external rotation    Knee flexion 4 4  Knee extension 4+ 4+  Ankle dorsiflexion 4+ 4+  Ankle plantarflexion    Ankle inversion    Ankle eversion    (Blank rows = not tested)  BED MOBILITY:  Sit to supine Complete Independence Supine to sit Complete Independence  TRANSFERS: Assistive device utilized: None  Sit to stand: Complete Independence Stand to sit: Complete Independence Chair to chair: Complete Independence Floor:  TBD  RAMP:  Level of Assistance: Complete Independence Assistive device utilized: None Ramp Comments:   CURB:  Level of Assistance: Complete Independence Assistive device utilized: None Curb Comments:   STAIRS: Level of Assistance: Complete Independence Stair Negotiation Technique: Alternating Pattern  with No Rails Number of Stairs: 4  Height of Stairs: 6  Comments: WFL  GAIT: Gait pattern:   , step through pattern, decreased arm swing- Right, decreased arm swing- Left, decreased trunk rotation, and trunk flexed Distance walked: 166ft Assistive device utilized: None Level of assistance: Complete Independence Comments:   FUNCTIONAL TESTS:  5 times sit to stand: 11.51 Timed up and go (TUG): 8.25 6 minute walk test: 67ft 10 meter walk test: 7.9 and 8.3  Berg Balance Scale: 49 Functional gait assessment: 25  PATIENT SURVEYS:  FOTO 53                                                                                                                              TREATMENT DATE: 09/14/2023   Physical Performance  Testing:  FOTO: 53 SLS: R: 10 sec; L: 6 sec 6-minute walk test: 1115' BERG: 56/56 FGA: 28/30 (8. Gait with eyes closed & 10. Steps pt scored 2/3 on each)   PATIENT EDUCATION: Education details: POC, HEP  Person educated: Patient Education method: Explanation Education comprehension: verbalized understanding  HOME EXERCISE PROGRAM: Access Code: [email or web address] URL: [email or web address] Date: 09/05/2023 Prepared by: Grier Rocher  Exercises - Sit to Stand  - 1 x daily - 2-3 x weekly - 2 sets - 10 reps - Standing Tandem Balance with Counter Support  - 1 x daily - 2-3 x weekly - 4 sets - 10 reps - 15 hold - Single Leg Stance with Support  - 1 x daily - 2-3 x weekly - 4 sets - 10 reps - 10 hold - Standing Hip Abduction with Counter Support  - 1 x daily - 2-3 x weekly - 2 sets - 10 reps - Standing March with Counter Support  - 1 x daily - 2-3 x weekly - 2 sets - 10 reps  GOALS: Goals reviewed with patient? Yes   SHORT TERM GOALS: Target date: 09/29/2023  Patient will be independent in home exercise program to improve strength/mobility for better functional independence with ADLs. Baseline: to be given at session 2.  Goal status:  INITIAL   LONG TERM GOALS: Target date: 11/24/2023  Patient will increase FOTO score  by equal to or greater than  4 points  to demonstrate statistically significant improvement in mobility and quality of life.  Baseline: 53 10/03/23: 53 Goal status: IN PROGRESS  2.  Patient (> 81 years old) will improve SLS by < 15 seconds indicating an increased LE strength and improved balance. Baseline: 4 sec bil 10/03/23: 10 sec R, 6 sec L Goal status: IN PROGRESS  3.  Patient will increase Berg Balance score by > 4 points to demonstrate decreased fall risk during functional activities Baseline: 49 10/03/23: 56 Goal status: MET  4.  Patient will increase 6 min walk test by 159ft  as to improve gait speed for better community ambulation and to reduce  fall risk. Baseline: 621ft for 3:65min  10/03/23: 1115'  Goal status: MET  5.  Patient will increase FGA score to >27 as to demonstrate reduced fall risk and improved dynamic gait balance for better safety with community/home ambulation.  Baseline: 25 10/03/23: 28 Goal status: MET   ASSESSMENT:  CLINICAL IMPRESSION:  Pt has made significant improvement towards goals, either meeting or improving upon 5/6.  Pt has not progressed with the FOTO score, however self-reports he is doing better.  Pt feels as though his time in therapy is short-lived, noting that he is more confident with walking, however he is not at the point he would like to be at the conclusion.  Patient's condition has the potential to improve in response to therapy. Maximum improvement is yet to be obtained. The anticipated improvement is attainable and reasonable in a generally predictable time.   Pt will continue to benefit from skilled therapy to address remaining deficits in order to improve overall QoL and return to PLOF.     OBJECTIVE IMPAIRMENTS: Abnormal gait, cardiopulmonary status limiting activity, decreased activity tolerance, decreased balance, decreased cognition, decreased endurance, decreased mobility, increased fascial restrictions, and increased muscle spasms.   ACTIVITY LIMITATIONS: carrying, lifting, bending, stairs, transfers, and locomotion level  PARTICIPATION LIMITATIONS: community activity and yard work  PERSONAL FACTORS: Age, Fitness, and 1-2 comorbidities: CVA and PD vs myasthenia gravis   are also affecting patient's functional outcome.   REHAB POTENTIAL: Excellent  CLINICAL DECISION MAKING: Stable/uncomplicated  EVALUATION COMPLEXITY: Low  PLAN:  PT FREQUENCY: 1-2x/week  PT DURATION: 12 weeks  PLANNED INTERVENTIONS: 97110-Therapeutic exercises, 97530- Therapeutic activity, [id number]- Neuromuscular re-education, 97535- Self Care, [id number]- Manual therapy, [phone]- Gait training, [phone]- Splinting,  Patient/Family education, Balance training, Stair training, Taping, Joint mobilization, Joint manipulation, DME instructions, Wheelchair mobility training, Cryotherapy, and Moist heat  PLAN FOR NEXT SESSION:   Continued POC Improved activity tolerance and BLE strengthening  High level balance training.    Nolon Bussing, PT, DPT Physical Therapist - [facility]  10/03/23, 3:51 PM

## 2023-10-03 NOTE — Therapy (Signed)
OUTPATIENT SPEECH LANGUAGE PATHOLOGY  [facility]   Patient Name: Edgar Huynh MRN: [id number] DOB:Jan 03, 1956, 68 y.o., male Today's Date: 10/03/2023  PCP: Wilford Corner REFERRING PROVIDER: Cristopher Peru, MD   End of Session - 10/03/23 1046     Visit Number 8    Number of Visits 25    Date for SLP Re-Evaluation 11/21/23    Authorization Type [employer]    Authorization Time Period 09/05/2023 thur 11/28/2023    Authorization - Visit Number 8    Authorization - Number of Visits 24    Progress Note Due on Visit 10    SLP Start Time 1100    SLP Stop Time  1145    SLP Time Calculation (min) 45 min    Activity Tolerance Patient tolerated treatment well              Past Medical History:  Diagnosis Date   Anxiety    COPD (chronic obstructive pulmonary disease) (HCC)    Depression    GERD (gastroesophageal reflux disease)    Glaucoma    History of colonic polyps    History of kidney stones    Hypercholesteremia    Hyperlipidemia    Hypertension    Neuropathy    Osteoarthritis of left knee    Sleep apnea    Stroke (HCC) 12/10/1996   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   TIA (transient ischemic attack)    Type 1 diabetes (HCC)    on insulin pump   Past Surgical History:  Procedure Laterality Date   CAROTID ARTERY ANGIOPLASTY Right 10/18/2004   CATARACT EXTRACTION, BILATERAL Bilateral    COLONOSCOPY     COLONOSCOPY WITH PROPOFOL N/A 08/25/2018   Procedure: COLONOSCOPY WITH PROPOFOL;  Surgeon: Scot Jun, MD;  [facility] ENDOSCOPY;  Service: Endoscopy;  Laterality: N/A;   CYSTOSCOPY     ESOPHAGOGASTRODUODENOSCOPY     ESOPHAGOGASTRODUODENOSCOPY N/A 09/01/2022   Procedure: ESOPHAGOGASTRODUODENOSCOPY (EGD);  Surgeon: Toledo, Boykin Nearing, MD;  Location: [facility] ENDOSCOPY;  Service: Gastroenterology;  Laterality: N/A;   ESOPHAGOGASTRODUODENOSCOPY (EGD) WITH PROPOFOL N/A 08/25/2018   Procedure:  ESOPHAGOGASTRODUODENOSCOPY (EGD) WITH PROPOFOL;  Surgeon: Scot Jun, MD;  Location: [facility] ENDOSCOPY;  Service: Endoscopy;  Laterality: N/A;   EYE SURGERY     PARTIAL KNEE ARTHROPLASTY Left 01/05/2022   Procedure: UNICOMPARTMENTAL KNEE;  Surgeon: Christena Flake, MD;  Location: [facility] ORS;  Service: Orthopedics;  Laterality: Left;   WISDOM TOOTH EXTRACTION     Patient Active Problem List   Diagnosis Date Noted   DKA (diabetic ketoacidosis) (HCC) 02/11/2023   Known medical problems 02/11/2023   Colitis 02/11/2023   SOB (shortness of breath) 04/09/2021   HLD (hyperlipidemia) 04/09/2021   HTN (hypertension) 04/09/2021   Stroke (HCC) 04/09/2021   Chest pain 04/09/2021   Depression with anxiety 04/09/2021   Anxiety disorder 09/23/2017   MDD (major depressive disorder), single episode, severe with psychotic features (HCC) 03/01/2017   Adjustment disorder with mixed anxiety and depressed mood 02/28/2017   Psychosis, affective (HCC) 02/28/2017   Diabetic retinopathy (HCC) 10/03/2012   GERD (gastroesophageal reflux disease) 04/04/2012   Diabetic neuropathy (HCC) 04/04/2012   DM (diabetes mellitus) type I, controlled, with peripheral vascular disorder (HCC) 02/04/2009   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 Current working diagnosis of Parkinson's Disease with concern for facial drooping, difficulty swallowing, hypophonia, and family history of Parkinson's disease. Pt also  experiences severe depression as well as complex motor tics d/t suspicion for longstanding tic disorder with component of tardive dyskinesia in setting of neuroleptics (Ability, risperidone).   DIAGNOSTIC FINDINGS:   Modified Barium Swallow Study 06/02/2018 "Mild pharyngeal dysphagia characterized by delayed pharyngeal swallow initiation, reduced pharyngeal pressure generation, mild pharyngeal residue, transient laryngeal penetration (nectar-thick and thin liquids), and aspiration X1." Of note, report documents "pt compliant of weak voice" recommend ENT follow up  MR Brain w wo contrast 07/05/2023 - No acute intracranial process. No evidence of acute or subacute  infarct.   Single fiber EMG with [facility] - scheduled 10/12/2023   PAIN:  Are you having pain? No  FALLS: Has patient fallen in last 6 months?  See PT evaluation for details  LIVING ENVIRONMENT: Lives with: lives with their spouse Lives in: House/apartment  PLOF:  Level of assistance: Independent with ADLs, Independent with IADLs Employment: On disability  PATIENT GOALS    to improve speech and swallow function  SUBJECTIVE STATEMENT: Pt brought in homework, reports increased ability to complete HEP Pt accompanied by: self  OBJECTIVE:   TODAY'S TREATMENT:  Skilled treatment session focused on pt's dysphagia and dysarthria. SLP facilitated session by providing the following interventions:   Pt required less frequent cues throughout session, suspect d/t completion of HEP   Training provided in the Six SPEAK OUT! Exercises   WARM-UP   (using "May---Me---My---Moe---Moo") Smooth Connected Speech- Supervision A Every syllable with INTENT-  Supervision A Typically produced at 85-90 dB- Supervision A to achieve 85 dB   AH Mouth opened wide- Supervision A Duration (not more than 10 seconds)-  Supervision A to achieve 7 seconds Volume consistent throughout- Supervision A Clear vocal quality throughout- Rare Min  A Typically produced at 85-90 dB- Rare Min A to achieve 85 dB   GLIDES Mouth opened wide- Supervision A Steady "ah," then glide up- Supervision A  Steady "ah," then glide down-  Supervision A  Clear vocal quality throughout- Supervision A  Typically produced at 85-90dB- Supervision A to achieve 85 dB   COUNTING  Smooth and connected- Supervision A  Neither mono-pitch nor chanted- Supervision A  Projected up and forward- Supervision A  Every number exaggerated- Supervision A   Typically produced at 80-85 dB-  Supervision A  to achieve 83 dB   READING Be deliberate- Supervision A  Projected up and forward- Supervision A  Every single word with INTENT- Supervision A  Typically produced at 75-85 dB- Supervision A  to achieve 70 dB   CONVERSATION  Begin with structured tasks Every single word with INTENT- Supervision A Rare Min A      PATIENT EDUCATION: Education details: see above Person educated: Patient Education method: Explanation Education comprehension: needs further education   HOME EXERCISE PROGRAM: Speak Out assignments   GOALS: Goals reviewed with patient? Yes  SHORT TERM GOALS: Target date: 10 sessions  With Rare Min A, patient will demo HEP for motor speech accurately in 5/7 opportunities.  Baseline: Goal status: INITIAL  2.  To determine optimal resistance levels for Respiratory Muscle Training (RMT) for improving increase hyolaryngeal elevation and  strengthen cough for airway clearance, patient will participate in evaluation (and re-assessment as needed) of maximum expiratory pressure (MEP). Baseline:  Goal status: INITIAL  3.  With Min A, patient will complete 3 sets of 10 repetitions with EMST set at TBD cmH2O with self-reported effortful of 7 out of 10. Baseline:  Goal status: INITIAL  4.  With Min A, patient will improve speech intelligibility for  phrase by controlling rate of speech, over-articulation, and increased loudness to achieve 75%  intelligibility.  Baseline:  Goal status: INITIAL   LONG TERM GOALS: Target date: 11/21/2023  With Supervision A, patient will participate in 15 minutes conversation, maintaining average loudness of 75 dB and loud, good quality voice.   Baseline:  Goal status: INITIAL  2.  Patient will report improved communication effectiveness as measured by Communicative Effectiveness Survey. Baseline:  Goal status: INITIAL  3.  Patient will participate in objective swallowing evaluation (MBSS) to identify safest diet recommendation as well as therapeutic targets.  Baseline:  Goal status: INITIAL  4.  Patient will improve perception of swallowing as indicated by an improvement in EAT-10 score by 12 weeks from initial swallowing therapy session.  Baseline:  Goal status: INITIAL   ASSESSMENT:  CLINICAL IMPRESSION: Patient is a 68 year old male y.o.  who was seen today for a speech language treatment d/t Parkinson's Disease. Pt presents with moderate hypokinetic dysarthria that is c/b reduce articulation, volume, fast rate of speech, breathy hoarse vocal quality as well as report of s/s of potential aspiration with all PO consumption.   Pt with improvement noted during today's activities likely d/t increased completion of HEP. Progress made this week.  See the above treatment note for details.   OBJECTIVE IMPAIRMENTS include expressive language, dysarthria, voice disorder, and dysphagia. These impairments are limiting patient from managing medications, managing appointments, managing finances, household responsibilities, ADLs/IADLs, effectively communicating at home and in community, and safety when swallowing. Factors affecting potential to achieve goals and functional outcome are co-morbidities, medical prognosis, and severity of impairments. Patient will benefit from skilled SLP services to address above impairments and improve overall function.  REHAB POTENTIAL: Good  PLAN: SLP FREQUENCY:  2x/week  SLP DURATION: 12 weeks  PLANNED INTERVENTIONS: Aspiration precaution training, Pharyngeal strengthening exercises, Diet toleration management , Trials of upgraded texture/liquids, Oral motor exercises, Functional tasks, Multimodal communication approach, SLP instruction and feedback, Compensatory strategies, and Patient/family education   [place] Whitefield B. Dreama Saa, M.S., CCC-SLP, Tree surgeon Certified Brain Injury Specialist [facility]  [facility] Rehabilitation Services Office [phone] Ascom [phone] Fax [phone]

## 2023-10-05 ENCOUNTER — Ambulatory Visit: Payer: Medicare Other | Admitting: Physical Therapy

## 2023-10-05 ENCOUNTER — Ambulatory Visit: Payer: Medicare Other | Admitting: Speech Pathology

## 2023-10-10 ENCOUNTER — Ambulatory Visit: Payer: Medicare Other | Admitting: Speech Pathology

## 2023-10-10 DIAGNOSIS — R2689 Other abnormalities of gait and mobility: Secondary | ICD-10-CM | POA: Diagnosis not present

## 2023-10-10 DIAGNOSIS — R471 Dysarthria and anarthria: Secondary | ICD-10-CM

## 2023-10-10 NOTE — Therapy (Signed)
 OUTPATIENT SPEECH LANGUAGE PATHOLOGY  [facility]   Patient Name: Edgar Huynh MRN: [id number] DOB:1955/11/30, 68 y.o., male Today's Date: 10/10/2023  PCP: Wilford Corner REFERRING PROVIDER: Cristopher Peru, MD   End of Session - 10/10/23 1202     Visit Number 9    Number of Visits 25    Date for SLP Re-Evaluation 11/21/23    Authorization Type [employer]    Authorization Time Period 09/05/2023 thur 11/28/2023    Authorization - Visit Number 9    Authorization - Number of Visits 24    Progress Note Due on Visit 10    SLP Start Time 1145    SLP Stop Time  1220    SLP Time Calculation (min) 35 min    Activity Tolerance Patient tolerated treatment well              Past Medical History:  Diagnosis Date   Anxiety    COPD (chronic obstructive pulmonary disease) (HCC)    Depression    GERD (gastroesophageal reflux disease)    Glaucoma    History of colonic polyps    History of kidney stones    Hypercholesteremia    Hyperlipidemia    Hypertension    Neuropathy    Osteoarthritis of left knee    Sleep apnea    Stroke (HCC) 12/10/1996   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   TIA (transient ischemic attack)    Type 1 diabetes (HCC)    on insulin pump   Past Surgical History:  Procedure Laterality Date   CAROTID ARTERY ANGIOPLASTY Right 10/18/2004   CATARACT EXTRACTION, BILATERAL Bilateral    COLONOSCOPY     COLONOSCOPY WITH PROPOFOL N/A 08/25/2018   Procedure: COLONOSCOPY WITH PROPOFOL;  Surgeon: Scot Jun, MD;  Location[facility] ENDOSCOPY;  Service: Endoscopy;  Laterality: N/A;   CYSTOSCOPY     ESOPHAGOGASTRODUODENOSCOPY     ESOPHAGOGASTRODUODENOSCOPY N/A 09/01/2022   Procedure: ESOPHAGOGASTRODUODENOSCOPY (EGD);  Surgeon: Toledo, Boykin Nearing, MD;  Location: [facility] ENDOSCOPY;  Service: Gastroenterology;  Laterality: N/A;   ESOPHAGOGASTRODUODENOSCOPY (EGD) WITH PROPOFOL N/A 08/25/2018   Procedure:  ESOPHAGOGASTRODUODENOSCOPY (EGD) WITH PROPOFOL;  Surgeon: Scot Jun, MD;  Location: [facility] ENDOSCOPY;  Service: Endoscopy;  Laterality: N/A;   EYE SURGERY     PARTIAL KNEE ARTHROPLASTY Left 01/05/2022   Procedure: UNICOMPARTMENTAL KNEE;  Surgeon: Christena Flake, MD;  Location: [facility] ORS;  Service: Orthopedics;  Laterality: Left;   WISDOM TOOTH EXTRACTION     Patient Active Problem List   Diagnosis Date Noted   DKA (diabetic ketoacidosis) (HCC) 02/11/2023   Known medical problems 02/11/2023   Colitis 02/11/2023   SOB (shortness of breath) 04/09/2021   HLD (hyperlipidemia) 04/09/2021   HTN (hypertension) 04/09/2021   Stroke (HCC) 04/09/2021   Chest pain 04/09/2021   Depression with anxiety 04/09/2021   Anxiety disorder 09/23/2017   MDD (major depressive disorder), single episode, severe with psychotic features (HCC) 03/01/2017   Adjustment disorder with mixed anxiety and depressed mood 02/28/2017   Psychosis, affective (HCC) 02/28/2017   Diabetic retinopathy (HCC) 10/03/2012   GERD (gastroesophageal reflux disease) 04/04/2012   Diabetic neuropathy (HCC) 04/04/2012   DM (diabetes mellitus) type I, controlled, with peripheral vascular disorder (HCC) 02/04/2009   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 Current working diagnosis of Parkinson's Disease with concern for facial drooping, difficulty swallowing, hypophonia, and family history of Parkinson's disease. Pt also experiences severe depression as well as  complex motor tics d/t suspicion for longstanding tic disorder with component of tardive dyskinesia in setting of neuroleptics (Ability, risperidone).   DIAGNOSTIC FINDINGS:   Modified Barium Swallow Study 06/02/2018 "Mild pharyngeal dysphagia characterized by delayed pharyngeal swallow initiation, reduced pharyngeal pressure generation, mild pharyngeal residue, transient laryngeal penetration (nectar-thick and thin liquids), and aspiration X1." Of note, report documents "pt compliant of weak voice" recommend ENT follow up  MR Brain w wo contrast 07/05/2023 - No acute intracranial process. No evidence of acute or subacute  infarct.   Single fiber EMG with [facility] - scheduled 10/12/2023   PAIN:  Are you having pain? No  FALLS: Has patient fallen in last 6 months?  See PT evaluation for details  LIVING ENVIRONMENT: Lives with: lives with their spouse Lives in: House/apartment  PLOF:  Level of assistance: Independent with ADLs, Independent with IADLs Employment: On disability  PATIENT GOALS    to improve speech and swallow function  SUBJECTIVE STATEMENT: Pt's wife attended session with pt Pt accompanied by: self  OBJECTIVE:   TODAY'S TREATMENT:  Skilled treatment session focused on pt's dysphagia and dysarthria. SLP facilitated session by providing the following interventions:   Pt reports that he is completing HEP as "I am practicing about half of the time you said"  His wife reports "I only see him sleeping, he can sleep all day and all night" Continued education provided on need to practice for habit creation.   Training provided in the Six SPEAK OUT! Exercises   WARM-UP   (using "May---Me---My---Moe---Moo") Smooth Connected Speech- Moderate A Every syllable with INTENT-  Moderate A Typically produced at 85-90 dB- Moderate A to achieve 85 dB   AH Mouth opened wide- Supervision A Duration (not more than 10 seconds)-  Supervision A to achieve 7 seconds Volume consistent  throughout- Supervision A Clear vocal quality throughout- Rare Min A Typically produced at 85-90 dB- Rare Min A to achieve 85 dB   GLIDES Mouth opened wide- Moderate A Steady "ah," then glide up- Moderate A  Steady "ah," then glide down-  Moderate A  Clear vocal quality throughout- Moderate A  Typically produced at 85-90dB- Moderate A to achieve 85 dB   COUNTING  Smooth and connected- Moderate A  Neither mono-pitch nor chanted- Moderate A  Projected up and forward- Moderate A  Every number exaggerated- Moderate A   Typically produced at 80-85 dB-  Min A  to achieve 83 dB   READING Be deliberate- Moderate A  Projected up and forward- Moderate A  Every single word with INTENT- Moderate A  Typically produced at 75-85 dB- Moderate A  to achieve 70 dB   CONVERSATION  Begin with structured tasks Every single word with INTENT- Supervision A Rare Min A     SLP further facilitated session by providing skilled verbal and written education on communication strategies such as being in the room with the listener, eye contact, reducing distractions and being within arms length listener to improve communication success.   Pt reports some implementation of aspiration precautions such as sitting at the table and eating.   PATIENT  EDUCATION: Education details: see above Person educated: Patient Education method: Explanation Education comprehension: needs further education   HOME EXERCISE PROGRAM: Speak Out assignments   GOALS: Goals reviewed with patient? Yes  SHORT TERM GOALS: Target date: 10 sessions  With Rare Min A, patient will demo HEP for motor speech accurately in 5/7 opportunities.  Baseline: Goal status: INITIAL  2.  To determine optimal resistance levels for Respiratory Muscle Training (RMT) for improving increase hyolaryngeal elevation and strengthen cough for airway clearance, patient will participate in evaluation (and re-assessment as needed) of maximum expiratory  pressure (MEP). Baseline:  Goal status: INITIAL  3.  With Min A, patient will complete 3 sets of 10 repetitions with EMST set at TBD cmH2O with self-reported effortful of 7 out of 10. Baseline:  Goal status: INITIAL  4.  With Min A, patient will improve speech intelligibility for  phrase by controlling rate of speech, over-articulation, and increased loudness to achieve 75% intelligibility.  Baseline:  Goal status: INITIAL   LONG TERM GOALS: Target date: 11/21/2023  With Supervision A, patient will participate in 15 minutes conversation, maintaining average loudness of 75 dB and loud, good quality voice.   Baseline:  Goal status: INITIAL  2.  Patient will report improved communication effectiveness as measured by Communicative Effectiveness Survey. Baseline:  Goal status: INITIAL  3.  Patient will participate in objective swallowing evaluation (MBSS) to identify safest diet recommendation as well as therapeutic targets.  Baseline:  Goal status: INITIAL  4.  Patient will improve perception of swallowing as indicated by an improvement in EAT-10 score by 12 weeks from initial swallowing therapy session.  Baseline:  Goal status: INITIAL   ASSESSMENT:  CLINICAL IMPRESSION: Patient is a 68 year old male y.o.  who was seen today for a speech language treatment d/t Parkinson's Disease. Pt presents with moderate hypokinetic dysarthria that is c/b reduce articulation, volume, fast rate of speech, breathy hoarse vocal quality as well as report of s/s of potential aspiration with all PO consumption.   Pt and his wife reduced opportunities for conversation d/t pt personality of "being quiet" at baseline, reduced conversations, his wife's unaided hearing loss as well as pt's propensity to sleep "all day and all night." SLP recommended pt reading portions of the Bible out loud several times per day as that is something they both share in common. Pt agreeable. See the above treatment note for  details.   OBJECTIVE IMPAIRMENTS include expressive language, dysarthria, voice disorder, and dysphagia. These impairments are limiting patient from managing medications, managing appointments, managing finances, household responsibilities, ADLs/IADLs, effectively communicating at home and in community, and safety when swallowing. Factors affecting potential to achieve goals and functional outcome are co-morbidities, medical prognosis, and severity of impairments. Patient will benefit from skilled SLP services to address above impairments and improve overall function.  REHAB POTENTIAL: Good  PLAN: SLP FREQUENCY: 2x/week  SLP DURATION: 12 weeks  PLANNED INTERVENTIONS: Aspiration precaution training, Pharyngeal strengthening exercises, Diet toleration management , Trials of upgraded texture/liquids, Oral motor exercises, Functional tasks, Multimodal communication approach, SLP instruction and feedback, Compensatory strategies, and Patient/family education   Edgar Huynh B. Dreama Saa, M.S., CCC-SLP, Tree surgeon Certified Brain Injury Specialist [facility]  [facility] Rehabilitation Services Office [phone] Ascom [phone] Fax [phone]

## 2023-10-12 ENCOUNTER — Encounter: Payer: Medicare Other | Admitting: Speech Pathology

## 2023-10-12 ENCOUNTER — Ambulatory Visit: Payer: Medicare Other | Admitting: Physical Therapy

## 2023-10-13 ENCOUNTER — Ambulatory Visit: Payer: Medicare Other | Admitting: Speech Pathology

## 2023-10-13 ENCOUNTER — Ambulatory Visit: Payer: Medicare Other | Admitting: Physical Therapy

## 2023-10-13 DIAGNOSIS — R471 Dysarthria and anarthria: Secondary | ICD-10-CM

## 2023-10-13 DIAGNOSIS — R2689 Other abnormalities of gait and mobility: Secondary | ICD-10-CM

## 2023-10-13 DIAGNOSIS — M6281 Muscle weakness (generalized): Secondary | ICD-10-CM

## 2023-10-13 NOTE — Therapy (Signed)
 OUTPATIENT SPEECH LANGUAGE PATHOLOGY  [facility] NOTE 10th VISIT PROGRESS NOTE    Patient Name: Edgar Huynh MRN: [id number] DOB:1956-05-16, 68 y.o., male Today's Date: 10/13/2023  PCP: Wilford Corner REFERRING PROVIDER: Cristopher Peru, MD  Speech Therapy Progress Note  Dates of Reporting Period: 08/31/2023 to 10/13/2023  Objective: Patient has been seen for 10 speech therapy sessions this reporting period targeting pt's moderate dysarthria. Patient is making slower than expected progress towards goals d/t inconsistent completion of HEP.  As a result of continued education on HEP and habit formation, he reports that over the last week he has increased completion of HEP. See skilled intervention, clinical impressions, and goals below for details.    End of Session - 10/13/23 0852     Visit Number 10    Number of Visits 25    Date for SLP Re-Evaluation 11/21/23    Authorization Type [employer]    Authorization Time Period 09/05/2023 thur 11/28/2023    Authorization - Visit Number 10    Authorization - Number of Visits 24    Progress Note Due on Visit 10    SLP Start Time 0845    SLP Stop Time  0915    SLP Time Calculation (min) 30 min    Activity Tolerance Patient tolerated treatment well              Past Medical History:  Diagnosis Date   Anxiety    COPD (chronic obstructive pulmonary disease) (HCC)    Depression    GERD (gastroesophageal reflux disease)    Glaucoma    History of colonic polyps    History of kidney stones    Hypercholesteremia    Hyperlipidemia    Hypertension    Neuropathy    Osteoarthritis of left knee    Sleep apnea    Stroke (HCC) 12/10/1996   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   TIA (transient ischemic attack)    Type 1 diabetes (HCC)    on insulin pump   Past Surgical History:  Procedure Laterality Date   CAROTID ARTERY ANGIOPLASTY Right 10/18/2004   CATARACT EXTRACTION, BILATERAL  Bilateral    COLONOSCOPY     COLONOSCOPY WITH PROPOFOL N/A 08/25/2018   Procedure: COLONOSCOPY WITH PROPOFOL;  Surgeon: Scot Jun, MD;  Location: [facility] ENDOSCOPY;  Service: Endoscopy;  Laterality: N/A;   CYSTOSCOPY     ESOPHAGOGASTRODUODENOSCOPY     ESOPHAGOGASTRODUODENOSCOPY N/A 09/01/2022   Procedure: ESOPHAGOGASTRODUODENOSCOPY (EGD);  Surgeon: Toledo, Boykin Nearing, MD;  Location: [facility] ENDOSCOPY;  Service: Gastroenterology;  Laterality: N/A;   ESOPHAGOGASTRODUODENOSCOPY (EGD) WITH PROPOFOL N/A 08/25/2018   Procedure: ESOPHAGOGASTRODUODENOSCOPY (EGD) WITH PROPOFOL;  Surgeon: Scot Jun, MD;  Location: [facility] ENDOSCOPY;  Service: Endoscopy;  Laterality: N/A;   EYE SURGERY     PARTIAL KNEE ARTHROPLASTY Left 01/05/2022   Procedure: UNICOMPARTMENTAL KNEE;  Surgeon: Christena Flake, MD;  Location: [facility] ORS;  Service: Orthopedics;  Laterality: Left;   WISDOM TOOTH EXTRACTION     Patient Active Problem List   Diagnosis Date Noted   DKA (diabetic ketoacidosis) (HCC) 02/11/2023   Known medical problems 02/11/2023   Colitis 02/11/2023   SOB (shortness of breath) 04/09/2021   HLD (hyperlipidemia) 04/09/2021   HTN (hypertension) 04/09/2021   Stroke (HCC) 04/09/2021   Chest pain 04/09/2021   Depression with anxiety 04/09/2021   Anxiety disorder 09/23/2017   MDD (major depressive disorder), single episode, severe with psychotic features (HCC) 03/01/2017   Adjustment disorder with mixed anxiety  and depressed mood 02/28/2017   Psychosis, affective (HCC) 02/28/2017   Diabetic retinopathy (HCC) 10/03/2012   GERD (gastroesophageal reflux disease) 04/04/2012   Diabetic neuropathy (HCC) 04/04/2012   DM (diabetes mellitus) type I, controlled, with peripheral vascular disorder (HCC) 02/04/2009   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 Current working diagnosis of Parkinson's Disease with concern for facial drooping, difficulty swallowing, hypophonia, and family history of Parkinson's disease. Pt also experiences severe depression as well as complex motor tics d/t suspicion for longstanding tic disorder with component of tardive dyskinesia in setting of neuroleptics (Ability, risperidone).   DIAGNOSTIC FINDINGS:   Modified Barium Swallow Study 06/02/2018 "Mild pharyngeal dysphagia characterized by delayed pharyngeal swallow initiation, reduced pharyngeal pressure generation, mild pharyngeal residue, transient laryngeal penetration (nectar-thick and thin liquids), and aspiration X1." Of note, report documents "pt compliant of weak voice" recommend ENT follow up  MR Brain w wo contrast 07/05/2023 - No acute intracranial process. No evidence of acute or subacute  infarct.   Single fiber EMG with [facility] - scheduled 10/12/2023   PAIN:  Are you having pain? No  FALLS: Has patient fallen in last 6 months?  See PT evaluation for details  LIVING ENVIRONMENT: Lives with: lives with their spouse Lives in: House/apartment  PLOF:  Level of assistance: Independent with ADLs, Independent with IADLs Employment: On disability  PATIENT GOALS    to improve speech and swallow function  SUBJECTIVE STATEMENT: Pt seen after PT and reports significant fatigue "I am ready for a nap" Pt accompanied by: self  OBJECTIVE:   TODAY'S TREATMENT:  Skilled treatment session focused on pt's dysphagia and dysarthria. SLP facilitated session by providing the following interventions:    Training provided in the Six SPEAK OUT!  Exercises   WARM-UP   (using "May---Me---My---Moe---Moo") Smooth Connected Speech- Moderate A Every syllable with INTENT-  Moderate A Typically produced at 85-90 dB- Moderate A to achieve 85 dB   AH Mouth opened wide- Supervision A Duration (not more than 10 seconds)-  Supervision A to achieve 7 seconds Volume consistent throughout- Supervision A Clear vocal quality throughout- Rare Min A Typically produced at 85-90 dB- Rare Min A to achieve 85 dB   GLIDES Mouth opened wide- Moderate A Steady "ah," then glide up- Moderate A  Steady "ah," then glide down-  Moderate A  Clear vocal quality throughout- Moderate A  Typically produced at 85-90dB- Moderate A to achieve 85 dB   COUNTING  Smooth and connected- Moderate A  Neither mono-pitch nor chanted- Moderate A  Projected up and forward- Moderate A  Every number exaggerated- Moderate A   Typically produced at 80-85 dB-  Min A  to achieve 83 dB   READING Be deliberate- Moderate A  Projected up and forward- Moderate A  Every single word with INTENT- Moderate A  Typically produced at 75-85 dB- Moderate A  to achieve 70 dB   CONVERSATION  Begin with structured tasks Every single word with INTENT- Supervision A Rare Min A     Suspect pt benefited from increased cues d/t fatigue  from completing intense PT session and fact that all sessions were held earlier in the day than previous appts.   PATIENT EDUCATION: Education details: see above Person educated: Patient Education method: Explanation Education comprehension: needs further education   HOME EXERCISE PROGRAM: Speak Out assignments   GOALS: Goals reviewed with patient? Yes  SHORT TERM GOALS: Target date: 10 sessions  With Rare Min A, patient will demo HEP for motor speech accurately in 5/7 opportunities.  Baseline: Goal status: INITIAL: inconsistent completion  2.  To determine optimal resistance levels for Respiratory Muscle Training (RMT) for improving  increase hyolaryngeal elevation and strengthen cough for airway clearance, patient will participate in evaluation (and re-assessment as needed) of maximum expiratory pressure (MEP). Baseline:  Goal status: INITIAL: deferred at this time  3.  With Min A, patient will complete 3 sets of 10 repetitions with EMST set at TBD cmH2O with self-reported effortful of 7 out of 10. Baseline:  Goal status: INITIAL: deferred at this time  4.  With Min A, patient will improve speech intelligibility for  phrase by controlling rate of speech, over-articulation, and increased loudness to achieve 75% intelligibility.  Baseline:  Goal status: INITIAL: progress made towards goals   LONG TERM GOALS: Target date: 11/21/2023  With Supervision A, patient will participate in 15 minutes conversation, maintaining average loudness of 75 dB and loud, good quality voice.   Baseline:  Goal status: INITIAL: progress made  2.  Patient will report improved communication effectiveness as measured by Communicative Effectiveness Survey. Baseline:  Goal status: INITIAL: progress made  3.  Patient will participate in objective swallowing evaluation (MBSS) to identify safest diet recommendation as well as therapeutic targets.  Baseline:  Goal status: INITIAL: deferred as s/s decreased when following general aspiration precautions   4.  Patient will improve perception of swallowing as indicated by an improvement in EAT-10 score by 12 weeks from initial swallowing therapy session.  Baseline:  Goal status: INITIAL: pt reports improvement when following general aspiration precautions.   ASSESSMENT:  CLINICAL IMPRESSION: Patient is a 68 year old male y.o.  who was seen today for a speech language treatment d/t Parkinson's Disease. Pt presents with moderate hypokinetic dysarthria that is c/b reduce articulation, volume, fast rate of speech, breathy hoarse vocal quality as well as report of s/s of potential aspiration with all  PO consumption.    See the above treatment note for details.   OBJECTIVE IMPAIRMENTS include expressive language, dysarthria, voice disorder, and dysphagia. These impairments are limiting patient from managing medications, managing appointments, managing finances, household responsibilities, ADLs/IADLs, effectively communicating at home and in community, and safety when swallowing. Factors affecting potential to achieve goals and functional outcome are co-morbidities, medical prognosis, and severity of impairments. Patient will benefit from skilled SLP services to address above impairments and improve overall function.  REHAB POTENTIAL: Good  PLAN: SLP FREQUENCY: 2x/week  SLP DURATION: 12 weeks  PLANNED INTERVENTIONS: Aspiration precaution training, Pharyngeal strengthening exercises, Diet toleration management , Trials of upgraded texture/liquids, Oral motor exercises, Functional tasks, Multimodal communication approach, SLP instruction and feedback, Compensatory strategies, and Patient/family education   Novelle Addair B. Dreama Saa, M.S., CCC-SLP, Tree surgeon Certified Brain Injury Specialist [facility]  [facility] Office [phone] Ascom [phone] Fax [phone]

## 2023-10-13 NOTE — Therapy (Signed)
 OUTPATIENT PHYSICAL THERAPY NEURO TREATMENT/PHYSICAL THERAPY PROGRESS NOTE   Dates of reporting period  08/31/23   to   10/03/23   Patient Name: Edgar Huynh MRN: [id number] DOB:1956-07-31, 68 y.o., male Today's Date: 10/13/2023   PCP: Wilford Corner, PA-C  REFERRING PROVIDER: Wilford Corner, PA-C   END OF SESSION:  PT End of Session - 10/13/23 0755     Visit Number 11    Number of Visits 24    Date for PT Re-Evaluation 11/23/23    PT Start Time 0800    PT Stop Time 0840    PT Time Calculation (min) 40 min    Equipment Utilized During Treatment Gait belt    Activity Tolerance Patient tolerated treatment well    Behavior During Therapy WFL for tasks assessed/performed              Past Medical History:  Diagnosis Date   Anxiety    COPD (chronic obstructive pulmonary disease) (HCC)    Depression    GERD (gastroesophageal reflux disease)    Glaucoma    History of colonic polyps    History of kidney stones    Hypercholesteremia    Hyperlipidemia    Hypertension    Neuropathy    Osteoarthritis of left knee    Sleep apnea    Stroke (HCC) 12/10/1996   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   TIA (transient ischemic attack)    Type 1 diabetes (HCC)    on insulin pump   Past Surgical History:  Procedure Laterality Date   CAROTID ARTERY ANGIOPLASTY Right 10/18/2004   CATARACT EXTRACTION, BILATERAL Bilateral    COLONOSCOPY     COLONOSCOPY WITH PROPOFOL N/A 08/25/2018   Procedure: COLONOSCOPY WITH PROPOFOL;  Surgeon: Scot Jun, MD;  Location: [facility] ENDOSCOPY;  Service: Endoscopy;  Laterality: N/A;   CYSTOSCOPY     ESOPHAGOGASTRODUODENOSCOPY     ESOPHAGOGASTRODUODENOSCOPY N/A 09/01/2022   Procedure: ESOPHAGOGASTRODUODENOSCOPY (EGD);  Surgeon: Toledo, Boykin Nearing, MD;  Location: [facility] ENDOSCOPY;  Service: Gastroenterology;  Laterality: N/A;   ESOPHAGOGASTRODUODENOSCOPY (EGD) WITH PROPOFOL N/A 08/25/2018   Procedure:  ESOPHAGOGASTRODUODENOSCOPY (EGD) WITH PROPOFOL;  Surgeon: Scot Jun, MD;  Location[facility] ENDOSCOPY;  Service: Endoscopy;  Laterality: N/A;   EYE SURGERY     PARTIAL KNEE ARTHROPLASTY Left 01/05/2022   Procedure: UNICOMPARTMENTAL KNEE;  Surgeon: Christena Flake, MD;  Location: [facility] ORS;  Service: Orthopedics;  Laterality: Left;   WISDOM TOOTH EXTRACTION     Patient Active Problem List   Diagnosis Date Noted   DKA (diabetic ketoacidosis) (HCC) 02/11/2023   Known medical problems 02/11/2023   Colitis 02/11/2023   SOB (shortness of breath) 04/09/2021   HLD (hyperlipidemia) 04/09/2021   HTN (hypertension) 04/09/2021   Stroke (HCC) 04/09/2021   Chest pain 04/09/2021   Depression with anxiety 04/09/2021   Anxiety disorder 09/23/2017   MDD (major depressive disorder), single episode, severe with psychotic features (HCC) 03/01/2017   Adjustment disorder with mixed anxiety and depressed mood 02/28/2017   Psychosis, affective (HCC) 02/28/2017   Diabetic retinopathy (HCC) 10/03/2012   GERD (gastroesophageal reflux disease) 04/04/2012   Diabetic neuropathy (HCC) 04/04/2012   DM (diabetes mellitus) type I, controlled, with peripheral vascular disorder (HCC) 02/04/2009   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 Hoping to move appointment to later in the day. He states that he normally does not wake up until near 10:00 each  day.   Pt accompanied by: self  PERTINENT HISTORY:   From Neurologist "Mr. Bodkin mentioned that he is taking his medications as prescribed. 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   Concern for myasthenia gravis vs. parkinsonism in patient presenting with multiple neurological symptoms of facial drooping, difficulty swallowing, decrease speech volume, stiffness and short gait, shortness of breath and coughing. Patient states the symptoms started in 04/2023. Patient denise fluctuating droopy eyelids, starting new medications around the time of onset of symptoms. Patient does have a positive family history of Parkinson's disease in father. Negative for acute stroke, per 06/2023 MRI"   PAIN:  Are you having pain? No and occasional knee pain with falls >6 months ago   PRECAUTIONS: None  RED FLAGS: None   WEIGHT BEARING RESTRICTIONS: No  FALLS: Has patient fallen in last 6 months? No and several near falls  poor corrections of anterior LOB   LIVING ENVIRONMENT: Lives with: lives with their spouse Lives in: House/apartment Stairs: Yes: Internal: 13 steps; on left going up and External: 2 steps; on left going up Has following equipment at home: None  PLOF: Independent, Independent with basic ADLs, Independent with gait, and Independent with transfers  PATIENT GOALS: improve groggy drowsy feeling. Improve endurance and strength.   OBJECTIVE:  Note: Objective measures were completed at Evaluation unless otherwise noted.  DIAGNOSTIC FINDINGS:   MRI 11/19  IMPRESSION: Scattered and confluent T2 hyperintense signal in the periventricular white matter, likely the sequela of moderate chronic small vessel ischemic disease. Remote lacunar infarcts in the pons and left caudate. No acute intracranial process. No evidence of acute or subacute infarct.  COGNITION: Overall cognitive status: Within functional limits for tasks  assessed   SENSATION: WFL States increased sensitivity in BLE  COORDINATION: Dysdidodactic kinesia: WFL  Finger ot nose WFL  Ankle to knee: Mild decreased speed on the L and mild under shooting.    EDEMA:  WFL   MUSCLE TONE:  Grossly WFL  POSTURE: rounded shoulders and forward head  LOWER EXTREMITY ROM:  WFL  LOWER EXTREMITY MMT:    MMT Right Eval Left Eval  Hip flexion 4- 4-  Hip extension    Hip abduction 4- 4-  Hip adduction 4 4  Hip internal rotation    Hip external rotation    Knee flexion 4 4  Knee extension 4+ 4+  Ankle dorsiflexion 4+ 4+  Ankle plantarflexion    Ankle inversion    Ankle eversion    (Blank rows = not tested)  BED MOBILITY:  Sit to supine Complete Independence Supine to sit Complete Independence  TRANSFERS: Assistive device utilized: None  Sit to stand: Complete Independence Stand to sit: Complete Independence Chair to chair: Complete Independence Floor:  TBD  RAMP:  Level of Assistance: Complete Independence Assistive device utilized: None Ramp Comments:   CURB:  Level of Assistance: Complete Independence Assistive device utilized: None Curb Comments:   STAIRS: Level of Assistance: Complete Independence Stair Negotiation Technique: Alternating Pattern  with No Rails Number of Stairs: 4  Height of Stairs: 6  Comments: WFL  GAIT: Gait pattern:   , step through pattern, decreased arm swing- Right, decreased arm swing- Left, decreased trunk rotation, and trunk flexed Distance walked: 179ft Assistive device utilized: None Level of assistance: Complete Independence Comments:   FUNCTIONAL TESTS:  5 times sit to stand: 11.51 Timed up and go (TUG): 8.25 6 minute walk test: 63ft 10 meter walk test: 7.9 and 8.3  Berg Balance Scale: 49 Functional gait assessment: 25  PATIENT SURVEYS:  FOTO 53                                                                                                                               TREATMENT DATE: 09/14/2023   Octane level 1-5 with varied resistance x 6 min . Cues for keep speed >35 through variable resistance.   Sit<>stand with UE swing into extension 2 x 12 with rest break between bouts.   Weighted gait with 3# AW x 652ft. Mild soreness noted in bil hips, SOB rated 6/10 upon completion.   Side stepping over 2 canes in floor with 3# AW x 10 bil  Additional side stepping over 1 cane, 1bolster x 8 bil with 3# AW   Forward/reverse gait with 3#AW, 90ft, 2 x 5 reps. Instructed for increased step symmetry and step length, with good carryover.   Step up with contralateral hip flexion x8 bil, UE supported on rails, emphasis on large movements.   SLS 15 sec x 3 bil with intermittent UE support at rail.    Throughout session, PT provided supervision assist for safety unless otherwise noted. No significant   PATIENT EDUCATION: Education details: POC, HEP  Person educated: Patient Education method: Explanation Education comprehension: verbalized understanding  HOME EXERCISE PROGRAM: Access Code: [email or web address] URL: [email or web address] Date: 09/05/2023 Prepared by: Grier Rocher  Exercises - Sit to Stand  - 1 x daily - 2-3 x weekly - 2 sets - 10 reps - Standing  Tandem Balance with Counter Support  - 1 x daily - 2-3 x weekly - 4 sets - 10 reps - 15 hold - Single Leg Stance with Support  - 1 x daily - 2-3 x weekly - 4 sets - 10 reps - 10 hold - Standing Hip Abduction with Counter Support  - 1 x daily - 2-3 x weekly - 2 sets - 10 reps - Standing March with Counter Support  - 1 x daily - 2-3 x weekly - 2 sets - 10 reps  GOALS: Goals reviewed with patient? Yes   SHORT TERM GOALS: Target date: 09/29/2023  Patient will be independent in home exercise program to improve strength/mobility for better functional independence with ADLs. Baseline: to be given at session 2.  Goal status: INITIAL   LONG TERM GOALS: Target date: 11/24/2023  Patient will  increase FOTO score  by equal to or greater than  4 points  to demonstrate statistically significant improvement in mobility and quality of life.  Baseline: 53 10/03/23: 53 Goal status: IN PROGRESS  2.  Patient (> 28 years old) will improve SLS by < 15 seconds indicating an increased LE strength and improved balance. Baseline: 4 sec bil 10/03/23: 10 sec R, 6 sec L Goal status: IN PROGRESS  3.  Patient will increase Berg Balance score by > 4 points to demonstrate decreased fall risk during functional activities Baseline: 49 10/03/23: 56 Goal status: MET  4.  Patient will increase 6 min walk test by 135ft  as to improve gait speed for better community ambulation and to reduce fall risk. Baseline: 673ft for 3:81min  10/03/23: 1115'  Goal status: MET  5.  Patient will increase FGA score to >27 as to demonstrate reduced fall risk and improved dynamic gait balance for better safety with community/home ambulation.  Baseline: 25 10/03/23: 28 Goal status: MET   ASSESSMENT:  CLINICAL IMPRESSION:  PT treatment focused on improved cardiovascular capacity and high velocity, large amplitude movements. PT provided multiple prolonged therapeutic rest breaks due to BLE fatigue and SOB. Pt demonstrated improved step length and posture with cues from PT as well as improved self correction to maintain full step length with weighted gait to reduce fall rick and reduce shuffle. Noted to have improved tolerance for increased repetitions for sit<>stand compared to prior sessions with this PT.  Pt will continue to benefit from skilled therapy to address remaining deficits in order to improve overall QoL and return to PLOF.     OBJECTIVE IMPAIRMENTS: Abnormal gait, cardiopulmonary status limiting activity, decreased activity tolerance, decreased balance, decreased cognition, decreased endurance, decreased mobility, increased fascial restrictions, and increased muscle spasms.   ACTIVITY LIMITATIONS: carrying,  lifting, bending, stairs, transfers, and locomotion level  PARTICIPATION LIMITATIONS: community activity and yard work  PERSONAL FACTORS: Age, Fitness, and 1-2 comorbidities: CVA and PD vs myasthenia gravis   are also affecting patient's functional outcome.   REHAB POTENTIAL: Excellent  CLINICAL DECISION MAKING: Stable/uncomplicated  EVALUATION COMPLEXITY: Low  PLAN:  PT FREQUENCY: 1-2x/week  PT DURATION: 12 weeks  PLANNED INTERVENTIONS: 97110-Therapeutic exercises, 97530- Therapeutic activity, [id number]- Neuromuscular re-education, 97535- Self Care, [id number]- Manual therapy, [phone]- Gait training, [phone]- Splinting, Patient/Family education, Balance training, Stair training, Taping, Joint mobilization, Joint manipulation, DME instructions, Wheelchair mobility training, Cryotherapy, and Moist heat  PLAN FOR NEXT SESSION:   Continued POC Improved activity tolerance and BLE strengthening  High level balance training.    Grier Rocher PT, DPT  Physical Therapist - [facility]  9:16 AM 10/13/23

## 2023-10-14 ENCOUNTER — Encounter: Payer: Medicare Other | Admitting: Speech Pathology

## 2023-10-17 ENCOUNTER — Ambulatory Visit: Payer: Medicare Other | Admitting: Speech Pathology

## 2023-10-17 ENCOUNTER — Ambulatory Visit: Payer: Medicare Other | Attending: Family Medicine | Admitting: Physical Therapy

## 2023-10-17 DIAGNOSIS — R471 Dysarthria and anarthria: Secondary | ICD-10-CM | POA: Diagnosis present

## 2023-10-17 DIAGNOSIS — M6281 Muscle weakness (generalized): Secondary | ICD-10-CM | POA: Diagnosis present

## 2023-10-17 DIAGNOSIS — R2689 Other abnormalities of gait and mobility: Secondary | ICD-10-CM | POA: Diagnosis present

## 2023-10-17 DIAGNOSIS — R41841 Cognitive communication deficit: Secondary | ICD-10-CM | POA: Diagnosis present

## 2023-10-17 NOTE — Therapy (Signed)
 OUTPATIENT PHYSICAL THERAPY NEURO TREATMENT  Patient Name: Edgar Huynh MRN: [id number] DOB:17-Jul-1956, 68 y.o., male Today's Date: 10/17/2023   PCP: Wilford Corner, PA-C  REFERRING PROVIDER: Wilford Corner, PA-C   END OF SESSION:  PT End of Session - 10/17/23 1321     Visit Number 12    Number of Visits 24    Date for PT Re-Evaluation 11/23/23    PT Start Time 1319    PT Stop Time 1359    PT Time Calculation (min) 40 min    Equipment Utilized During Treatment Gait belt    Activity Tolerance Patient tolerated treatment well    Behavior During Therapy WFL for tasks assessed/performed              Past Medical History:  Diagnosis Date   Anxiety    COPD (chronic obstructive pulmonary disease) (HCC)    Depression    GERD (gastroesophageal reflux disease)    Glaucoma    History of colonic polyps    History of kidney stones    Hypercholesteremia    Hyperlipidemia    Hypertension    Neuropathy    Osteoarthritis of left knee    Sleep apnea    Stroke (HCC) 12/10/1996   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   TIA (transient ischemic attack)    Type 1 diabetes (HCC)    on insulin pump   Past Surgical History:  Procedure Laterality Date   CAROTID ARTERY ANGIOPLASTY Right 10/18/2004   CATARACT EXTRACTION, BILATERAL Bilateral    COLONOSCOPY     COLONOSCOPY WITH PROPOFOL N/A 08/25/2018   Procedure: COLONOSCOPY WITH PROPOFOL;  Surgeon: Scot Jun, MD;  Location: [facility] ENDOSCOPY;  Service: Endoscopy;  Laterality: N/A;   CYSTOSCOPY     ESOPHAGOGASTRODUODENOSCOPY     ESOPHAGOGASTRODUODENOSCOPY N/A 09/01/2022   Procedure: ESOPHAGOGASTRODUODENOSCOPY (EGD);  Surgeon: Toledo, Boykin Nearing, MD;  Location: [facility] ENDOSCOPY;  Service: Gastroenterology;  Laterality: N/A;   ESOPHAGOGASTRODUODENOSCOPY (EGD) WITH PROPOFOL N/A 08/25/2018   Procedure: ESOPHAGOGASTRODUODENOSCOPY (EGD) WITH PROPOFOL;  Surgeon: Scot Jun, MD;  Location: [facility]  ENDOSCOPY;  Service: Endoscopy;  Laterality: N/A;   EYE SURGERY     PARTIAL KNEE ARTHROPLASTY Left 01/05/2022   Procedure: UNICOMPARTMENTAL KNEE;  Surgeon: Christena Flake, MD;  Location: [facility] ORS;  Service: Orthopedics;  Laterality: Left;   WISDOM TOOTH EXTRACTION     Patient Active Problem List   Diagnosis Date Noted   DKA (diabetic ketoacidosis) (HCC) 02/11/2023   Known medical problems 02/11/2023   Colitis 02/11/2023   SOB (shortness of breath) 04/09/2021   HLD (hyperlipidemia) 04/09/2021   HTN (hypertension) 04/09/2021   Stroke (HCC) 04/09/2021   Chest pain 04/09/2021   Depression with anxiety 04/09/2021   Anxiety disorder 09/23/2017   MDD (major depressive disorder), single episode, severe with psychotic features (HCC) 03/01/2017   Adjustment disorder with mixed anxiety and depressed mood 02/28/2017   Psychosis, affective (HCC) 02/28/2017   Diabetic retinopathy (HCC) 10/03/2012   GERD (gastroesophageal reflux disease) 04/04/2012   Diabetic neuropathy (HCC) 04/04/2012   DM (diabetes mellitus) type I, controlled, with peripheral vascular disorder (HCC) 02/04/2009   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 States that he believes it is an effect of medicine. Has not done much today   Pt accompanied by: self  PERTINENT HISTORY:   From Neurologist "Edgar Huynh mentioned that he is taking his  medications as prescribed. 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   Concern for myasthenia gravis vs. parkinsonism in patient presenting with multiple neurological symptoms of facial drooping, difficulty swallowing, decrease speech volume, stiffness and short gait, shortness of breath and coughing. Patient states the symptoms started in 04/2023. Patient denise fluctuating droopy eyelids, starting new medications around the time of onset of symptoms. Patient does have a positive family history of Parkinson's disease in father. Negative for acute stroke, per 06/2023 MRI"   PAIN:  Are you having pain? No and occasional knee pain with falls >6 months ago   PRECAUTIONS: None  RED FLAGS: None   WEIGHT BEARING RESTRICTIONS: No  FALLS: Has patient fallen in last 6 months? No and several near falls  poor corrections of anterior LOB   LIVING ENVIRONMENT: Lives with: lives with their spouse Lives in: House/apartment Stairs: Yes: Internal: 13 steps; on left going up and External: 2 steps; on left going up Has following equipment at home: None  PLOF: Independent, Independent with basic ADLs, Independent with gait, and Independent with transfers  PATIENT GOALS: improve groggy drowsy feeling. Improve endurance and strength.   OBJECTIVE:  Note: Objective measures were completed at Evaluation unless otherwise noted.  DIAGNOSTIC FINDINGS:   MRI 11/19  IMPRESSION: Scattered and confluent T2 hyperintense signal in the periventricular white matter, likely the sequela of moderate chronic small vessel ischemic disease. Remote lacunar infarcts in the pons and left caudate. No acute intracranial process. No evidence of acute or subacute infarct.  COGNITION: Overall cognitive status: Within functional limits for tasks assessed   SENSATION: WFL States increased sensitivity in BLE  COORDINATION: Dysdidodactic kinesia: WFL  Finger ot  nose WFL  Ankle to knee: Mild decreased speed on the L and mild under shooting.    EDEMA:  WFL   MUSCLE TONE:  Grossly WFL  POSTURE: rounded shoulders and forward head  LOWER EXTREMITY ROM:     WFL  LOWER EXTREMITY MMT:    MMT Right Eval Left Eval  Hip flexion 4- 4-  Hip extension    Hip abduction 4- 4-  Hip adduction 4 4  Hip internal rotation    Hip external rotation    Knee flexion 4 4  Knee extension 4+ 4+  Ankle dorsiflexion 4+ 4+  Ankle plantarflexion    Ankle inversion    Ankle eversion    (Blank rows = not tested)  BED MOBILITY:  Sit to supine Complete Independence Supine to sit Complete Independence  TRANSFERS: Assistive device utilized: None  Sit to stand: Complete Independence Stand to sit: Complete Independence Chair to chair: Complete Independence Floor:  TBD  RAMP:  Level of Assistance: Complete Independence Assistive device utilized: None Ramp Comments:   CURB:  Level of Assistance: Complete Independence Assistive device utilized: None Curb Comments:   STAIRS: Level of Assistance: Complete Independence Stair Negotiation Technique: Alternating Pattern  with No Rails Number of Stairs: 4  Height of Stairs: 6  Comments: WFL  GAIT: Gait pattern:   , step through pattern, decreased arm swing- Right, decreased arm swing- Left, decreased trunk rotation, and trunk flexed Distance walked: 15ft Assistive device utilized:  None Level of assistance: Complete Independence Comments:   FUNCTIONAL TESTS:  5 times sit to stand: 11.51 Timed up and go (TUG): 8.25 6 minute walk test: 653ft 10 meter walk test: 7.9 and 8.3  Berg Balance Scale: 49 Functional gait assessment: 25  PATIENT SURVEYS:  FOTO 53                                                                                                                              TREATMENT DATE: 09/14/2023   Sit<>stand with UE swing into extension 2 x 12 with rest break between bouts. Performed  from elevated mat table on this day.   Seated trunk rotation with shoulder abduction/adduction 2x 12 bil to high five contralateral hand   Standing step forward with reciprocal UE swing 2 x 12 bil   Lateral lunge with hip ER and UE lateral reach x 12 bil. 1 mild LOB, requiring stepping strategy to correct to the R.   Side stepping on wide airex beam x 5 R and L  Tandem gait forward/reverse x 6 each with cues for BLE position to improve safety and protect ankle stability   Seated shoulder horizontal abduction x 15 YTB.  Attempted shoulder diagonals, reports pain in the L Shoulder.  Standing low row x 15 YTB,   Seated therex:  Reciprocal hip flexion x 15 3# AW Reciprocal LAQ x 12 3# AW  Hip abduction x 15 GTB    Weighted gait with 3# AW x 379ft. Mild SOB rated 5/10 upon completion.  Throughout session, PT provided supervision assist for safety unless otherwise noted. No significant   PATIENT EDUCATION: Education details: POC, HEP  Person educated: Patient Education method: Explanation Education comprehension: verbalized understanding  HOME EXERCISE PROGRAM: Access Code: [email or web address] URL: [email or web address] Date: 09/05/2023 Prepared by: Grier Rocher  Exercises - Sit to Stand  - 1 x daily - 2-3 x weekly - 2 sets - 10 reps - Standing Tandem Balance with Counter Support  - 1 x daily - 2-3 x weekly - 4 sets - 10 reps - 15 hold - Single Leg Stance with Support  - 1 x daily - 2-3 x weekly - 4 sets - 10 reps - 10 hold - Standing Hip Abduction with Counter Support  - 1 x daily - 2-3 x weekly - 2 sets - 10 reps - Standing March with Counter Support  - 1 x daily - 2-3 x weekly - 2 sets - 10 reps  GOALS: Goals reviewed with patient? Yes   SHORT TERM GOALS: Target date: 09/29/2023  Patient will be independent in home exercise program to improve strength/mobility for better functional independence with ADLs. Baseline: to be given at session 2.  Goal status:  INITIAL   LONG TERM GOALS: Target date: 11/24/2023  Patient will increase FOTO score  by equal to or greater than  4 points  to demonstrate statistically significant improvement in mobility and quality of life.  Baseline:  53 10/03/23: 53 Goal status: IN PROGRESS  2.  Patient (> 71 years old) will improve SLS by < 15 seconds indicating an increased LE strength and improved balance. Baseline: 4 sec bil 10/03/23: 10 sec R, 6 sec L Goal status: IN PROGRESS  3.  Patient will increase Berg Balance score by > 4 points to demonstrate decreased fall risk during functional activities Baseline: 49 10/03/23: 56 Goal status: MET  4.  Patient will increase 6 min walk test by 180ft  as to improve gait speed for better community ambulation and to reduce fall risk. Baseline: 633ft for 3:101min  10/03/23: 1115'  Goal status: MET  5.  Patient will increase FGA score to >27 as to demonstrate reduced fall risk and improved dynamic gait balance for better safety with community/home ambulation.  Baseline: 25 10/03/23: 28 Goal status: MET   ASSESSMENT:  CLINICAL IMPRESSION:  PT treatment focused on improved large amplitude movements and increased reciprocal movement patterns. PT provided multiple prolonged therapeutic rest breaks due to BLE fatigue and SOB. Pt demonstrates improved reciprocal movement and ROM in Bil shoulders, but limited ROM with scaption no the LUE due to shoulder pain. Slightly limited by reports of increased lethargy on this day. Pt will continue to benefit from skilled therapy to address remaining deficits in order to improve overall QoL and return to PLOF.     OBJECTIVE IMPAIRMENTS: Abnormal gait, cardiopulmonary status limiting activity, decreased activity tolerance, decreased balance, decreased cognition, decreased endurance, decreased mobility, increased fascial restrictions, and increased muscle spasms.   ACTIVITY LIMITATIONS: carrying, lifting, bending, stairs, transfers, and  locomotion level  PARTICIPATION LIMITATIONS: community activity and yard work  PERSONAL FACTORS: Age, Fitness, and 1-2 comorbidities: CVA and PD vs myasthenia gravis   are also affecting patient's functional outcome.   REHAB POTENTIAL: Excellent  CLINICAL DECISION MAKING: Stable/uncomplicated  EVALUATION COMPLEXITY: Low  PLAN:  PT FREQUENCY: 1-2x/week  PT DURATION: 12 weeks  PLANNED INTERVENTIONS: 97110-Therapeutic exercises, 97530- Therapeutic activity, [id number]- Neuromuscular re-education, 97535- Self Care, 30865- Manual therapy, [phone]- Gait training, [phone]- Splinting, Patient/Family education, Balance training, Stair training, Taping, Joint mobilization, Joint manipulation, DME instructions, Wheelchair mobility training, Cryotherapy, and Moist heat  PLAN FOR NEXT SESSION:   Continued POC Improved activity tolerance and BLE strengthening  High level balance training.    Grier Rocher PT, DPT  Physical Therapist - [place]  [facility]  1:23 PM 10/17/23

## 2023-10-17 NOTE — Therapy (Signed)
 OUTPATIENT SPEECH LANGUAGE PATHOLOGY  PARKINSON'S TREATMENT NOTE 10th VISIT PROGRESS NOTE    Patient Name: Edgar Huynh MRN: [id number] DOB:08-05-1956, 68 y.o., male Today's Date: 10/17/2023  PCP: Wilford Corner REFERRING PROVIDER: Cristopher Peru, MD  Speech Therapy Progress Note  Dates of Reporting Period: 08/31/2023 to 10/13/2023  Objective: Patient has been seen for 10 speech therapy sessions this reporting period targeting pt's moderate dysarthria. Patient is making slower than expected progress towards goals d/t inconsistent completion of HEP.  As a result of continued education on HEP and habit formation, he reports that over the last week he has increased completion of HEP. See skilled intervention, clinical impressions, and goals below for details.    End of Session - 10/17/23 1411     Visit Number 11    Number of Visits 25    Date for SLP Re-Evaluation 11/21/23    Authorization Type [employer]    Authorization Time Period 09/05/2023 thur 11/28/2023    Authorization - Visit Number 11    Authorization - Number of Visits 24    Progress Note Due on Visit 20    SLP Start Time 1400    SLP Stop Time  1430    SLP Time Calculation (min) 30 min    Activity Tolerance Patient tolerated treatment well              Past Medical History:  Diagnosis Date   Anxiety    COPD (chronic obstructive pulmonary disease) (HCC)    Depression    GERD (gastroesophageal reflux disease)    Glaucoma    History of colonic polyps    History of kidney stones    Hypercholesteremia    Hyperlipidemia    Hypertension    Neuropathy    Osteoarthritis of left knee    Sleep apnea    Stroke (HCC) 12/10/1996   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   TIA (transient ischemic attack)    Type 1 diabetes (HCC)    on insulin pump   Past Surgical History:  Procedure Laterality Date   CAROTID ARTERY ANGIOPLASTY Right 10/18/2004   CATARACT EXTRACTION, BILATERAL  Bilateral    COLONOSCOPY     COLONOSCOPY WITH PROPOFOL N/A 08/25/2018   Procedure: COLONOSCOPY WITH PROPOFOL;  Surgeon: Scot Jun, MD;  Location: [facility] ENDOSCOPY;  Service: Endoscopy;  Laterality: N/A;   CYSTOSCOPY     ESOPHAGOGASTRODUODENOSCOPY     ESOPHAGOGASTRODUODENOSCOPY N/A 09/01/2022   Procedure: ESOPHAGOGASTRODUODENOSCOPY (EGD);  Surgeon: Toledo, Boykin Nearing, MD;  Location: [facility] ENDOSCOPY;  Service: Gastroenterology;  Laterality: N/A;   ESOPHAGOGASTRODUODENOSCOPY (EGD) WITH PROPOFOL N/A 08/25/2018   Procedure: ESOPHAGOGASTRODUODENOSCOPY (EGD) WITH PROPOFOL;  Surgeon: Scot Jun, MD;  Location: [facility] ENDOSCOPY;  Service: Endoscopy;  Laterality: N/A;   EYE SURGERY     PARTIAL KNEE ARTHROPLASTY Left 01/05/2022   Procedure: UNICOMPARTMENTAL KNEE;  Surgeon: Christena Flake, MD;  Location: [facility] ORS;  Service: Orthopedics;  Laterality: Left;   WISDOM TOOTH EXTRACTION     Patient Active Problem List   Diagnosis Date Noted   DKA (diabetic ketoacidosis) (HCC) 02/11/2023   Known medical problems 02/11/2023   Colitis 02/11/2023   SOB (shortness of breath) 04/09/2021   HLD (hyperlipidemia) 04/09/2021   HTN (hypertension) 04/09/2021   Stroke (HCC) 04/09/2021   Chest pain 04/09/2021   Depression with anxiety 04/09/2021   Anxiety disorder 09/23/2017   MDD (major depressive disorder), single episode, severe with psychotic features (HCC) 03/01/2017   Adjustment disorder with mixed anxiety  and depressed mood 02/28/2017   Psychosis, affective (HCC) 02/28/2017   Diabetic retinopathy (HCC) 10/03/2012   GERD (gastroesophageal reflux disease) 04/04/2012   Diabetic neuropathy (HCC) 04/04/2012   DM (diabetes mellitus) type I, controlled, with peripheral vascular disorder (HCC) 02/04/2009   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 Current working diagnosis of Parkinson's Disease with concern for facial drooping, difficulty swallowing, hypophonia, and family history of Parkinson's disease. Pt also experiences severe depression as well as complex motor tics d/t suspicion for longstanding tic disorder with component of tardive dyskinesia in setting of neuroleptics (Ability, risperidone).   DIAGNOSTIC FINDINGS:   Modified Barium Swallow Study 06/02/2018 "Mild pharyngeal dysphagia characterized by delayed pharyngeal swallow initiation, reduced pharyngeal pressure generation, mild pharyngeal residue, transient laryngeal penetration (nectar-thick and thin liquids), and aspiration X1." Of note, report documents "pt compliant of weak voice" recommend ENT follow up  MR Brain w wo contrast 07/05/2023 - No acute intracranial process. No evidence of acute or subacute  infarct.   Single fiber EMG with [facility] - scheduled 10/12/2023   PAIN:  Are you having pain? No  FALLS: Has patient fallen in last 6 months?  See PT evaluation for details  LIVING ENVIRONMENT: Lives with: lives with their spouse Lives in: House/apartment  PLOF:  Level of assistance: Independent with ADLs, Independent with IADLs Employment: On disability  PATIENT GOALS    to improve speech and swallow function  SUBJECTIVE STATEMENT: Pt seen after PT and reports significant fatigue "I am wore out" Pt accompanied by: self  OBJECTIVE:   TODAY'S TREATMENT:  Skilled treatment session focused on pt's dysphagia and dysarthria. SLP facilitated session by providing the following interventions:   CONVERSATION  Begin with structured tasks Every  single word with INTENT- Supervision A Rare Min A  to achieve ~ 80% speech intelligibility at the sentence level d/t fast rate of speech    PATIENT EDUCATION: Education details: see above Person educated: Patient Education method: Explanation Education comprehension: needs further education   HOME EXERCISE PROGRAM: Speak Out assignments   GOALS: Goals reviewed with patient? Yes  SHORT TERM GOALS: Target date: 10 sessions  With Rare Min A, patient will demo HEP for motor speech accurately in 5/7 opportunities.  Baseline: Goal status: INITIAL: inconsistent completion  2.  To determine optimal resistance levels for Respiratory Muscle Training (RMT) for improving increase hyolaryngeal elevation and strengthen cough for airway clearance, patient will participate in evaluation (and re-assessment as needed) of maximum expiratory pressure (MEP). Baseline:  Goal status: INITIAL: deferred at this time  3.  With Min A, patient will complete 3 sets of 10 repetitions with EMST set at TBD cmH2O with self-reported effortful of 7 out of 10. Baseline:  Goal status: INITIAL: deferred at this time  4.  With Min A, patient will improve speech intelligibility for  phrase by controlling rate of speech, over-articulation, and increased loudness to achieve 75% intelligibility.  Baseline:  Goal status: INITIAL: progress made towards goals   LONG TERM GOALS: Target date: 11/21/2023  With Supervision A, patient will participate in 63  minutes conversation, maintaining average loudness of 75 dB and loud, good quality voice.   Baseline:  Goal status: INITIAL: progress made  2.  Patient will report improved communication effectiveness as measured by Communicative Effectiveness Survey. Baseline:  Goal status: INITIAL: progress made  3.  Patient will participate in objective swallowing evaluation (MBSS) to identify safest diet recommendation as well as therapeutic targets.  Baseline:  Goal status:  INITIAL: deferred as s/s decreased when following general aspiration precautions   4.  Patient will improve perception of swallowing as indicated by an improvement in EAT-10 score by 12 weeks from initial swallowing therapy session.  Baseline:  Goal status: INITIAL: pt reports improvement when following general aspiration precautions.   ASSESSMENT:  CLINICAL IMPRESSION: Patient is a 68 year old male y.o.  who was seen today for a speech language treatment d/t Parkinson's Disease. 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     Pt continues to be apathetic about HEP and increasing his activities to promote opportunities for talking. See the above treatment note for details.   OBJECTIVE IMPAIRMENTS include expressive language, dysarthria, voice disorder, and dysphagia. These impairments are limiting patient from managing medications, managing appointments, managing finances, household responsibilities, ADLs/IADLs, effectively communicating at home and in community, and safety when swallowing. Factors affecting potential to achieve goals and functional outcome are co-morbidities, medical prognosis, and severity of impairments. Patient will benefit from skilled SLP services to address above impairments and improve overall function.  REHAB POTENTIAL: Good  PLAN: SLP FREQUENCY: 2x/week  SLP DURATION: 12 weeks  PLANNED INTERVENTIONS: Aspiration precaution training, Pharyngeal strengthening exercises, Diet toleration management , Trials of upgraded texture/liquids, Oral motor exercises, Functional tasks, Multimodal communication approach, SLP instruction and feedback, Compensatory strategies, and Patient/family education   Aidric Endicott B. Dreama Saa, M.S., CCC-SLP, Tree surgeon Certified Brain Injury Specialist [facility]  [facility] Office [phone] Ascom [phone] Fax [phone]

## 2023-10-19 ENCOUNTER — Ambulatory Visit: Payer: Medicare Other | Admitting: Physical Therapy

## 2023-10-19 ENCOUNTER — Ambulatory Visit: Payer: Medicare Other | Admitting: Speech Pathology

## 2023-10-19 DIAGNOSIS — M6281 Muscle weakness (generalized): Secondary | ICD-10-CM

## 2023-10-19 DIAGNOSIS — R471 Dysarthria and anarthria: Secondary | ICD-10-CM

## 2023-10-19 DIAGNOSIS — R2689 Other abnormalities of gait and mobility: Secondary | ICD-10-CM

## 2023-10-19 NOTE — Therapy (Addendum)
 OUTPATIENT SPEECH LANGUAGE PATHOLOGY  PARKINSON'S TREATMENT NOTE     Patient Name: Edgar Huynh MRN: [id number] DOB:[birth year], 68 y.o., male Today's Date: 10/19/2023  PCP: Wilford Corner REFERRING PROVIDER: Cristopher Peru, MD    End of Session - 10/19/23 1147     Visit Number 12    Number of Visits 25    Date for SLP Re-Evaluation 11/21/23    Authorization Type [employer]    Authorization Time Period 09/05/2023 thur 11/28/2023    Authorization - Visit Number 12    Authorization - Number of Visits 24    Progress Note Due on Visit 30    SLP Start Time 1145    SLP Stop Time  1215    SLP Time Calculation (min) 30 min    Activity Tolerance Patient tolerated treatment well              Past Medical History:  Diagnosis Date   Anxiety    COPD (chronic obstructive pulmonary disease) (HCC)    Depression    GERD (gastroesophageal reflux disease)    Glaucoma    History of colonic polyps    History of kidney stones    Hypercholesteremia    Hyperlipidemia    Hypertension    Neuropathy    Osteoarthritis of left knee    Sleep apnea    Stroke (HCC) 12/10/1996   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   TIA (transient ischemic attack)    Type 1 diabetes (HCC)    on insulin pump   Past Surgical History:  Procedure Laterality Date   CAROTID ARTERY ANGIOPLASTY Right 10/18/2004   CATARACT EXTRACTION, BILATERAL Bilateral    COLONOSCOPY     COLONOSCOPY WITH PROPOFOL N/A 08/25/2018   Procedure: COLONOSCOPY WITH PROPOFOL;  Surgeon: Scot Jun, MD;  Location: [facility] ENDOSCOPY;  Service: Endoscopy;  Laterality: N/A;   CYSTOSCOPY     ESOPHAGOGASTRODUODENOSCOPY     ESOPHAGOGASTRODUODENOSCOPY N/A 09/01/2022   Procedure: ESOPHAGOGASTRODUODENOSCOPY (EGD);  Surgeon: Toledo, Boykin Nearing, MD;  Location: [facility] ENDOSCOPY;  Service: Gastroenterology;  Laterality: N/A;   ESOPHAGOGASTRODUODENOSCOPY (EGD) WITH PROPOFOL N/A 08/25/2018   Procedure:  ESOPHAGOGASTRODUODENOSCOPY (EGD) WITH PROPOFOL;  Surgeon: Scot Jun, MD;  Location: [facility] ENDOSCOPY;  Service: Endoscopy;  Laterality: N/A;   EYE SURGERY     PARTIAL KNEE ARTHROPLASTY Left 01/05/2022   Procedure: UNICOMPARTMENTAL KNEE;  Surgeon: Christena Flake, MD;  Location: [facility] ORS;  Service: Orthopedics;  Laterality: Left;   WISDOM TOOTH EXTRACTION     Patient Active Problem List   Diagnosis Date Noted   DKA (diabetic ketoacidosis) (HCC) 02/11/2023   Known medical problems 02/11/2023   Colitis 02/11/2023   SOB (shortness of breath) 04/09/2021   HLD (hyperlipidemia) 04/09/2021   HTN (hypertension) 04/09/2021   Stroke (HCC) 04/09/2021   Chest pain 04/09/2021   Depression with anxiety 04/09/2021   Anxiety disorder 09/23/2017   MDD (major depressive disorder), single episode, severe with psychotic features (HCC) 03/01/2017   Adjustment disorder with mixed anxiety and depressed mood 02/28/2017   Psychosis, affective (HCC) 02/28/2017   Diabetic retinopathy (HCC) 10/03/2012   GERD (gastroesophageal reflux disease) 04/04/2012   Diabetic neuropathy (HCC) 04/04/2012   DM (diabetes mellitus) type I, controlled, with peripheral vascular disorder (HCC) 02/04/2009   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 Current working diagnosis of Parkinson's Disease with concern for facial drooping, difficulty swallowing, hypophonia, and family history of Parkinson's disease. Pt also experiences severe depression as well as  complex motor tics d/t suspicion for longstanding tic disorder with component of tardive dyskinesia in setting of neuroleptics (Ability, risperidone).   DIAGNOSTIC FINDINGS:   Modified Barium Swallow Study 06/02/2018 "Mild pharyngeal dysphagia characterized by delayed pharyngeal swallow initiation, reduced pharyngeal pressure generation, mild pharyngeal residue, transient laryngeal penetration (nectar-thick and thin liquids), and aspiration X1." Of note, report documents "pt compliant of weak voice" recommend ENT follow up  MR Brain w wo contrast 07/05/2023 - No acute intracranial process. No evidence of acute or subacute  infarct.   Single fiber EMG with [facility] - scheduled 10/12/2023   PAIN:  Are you having pain? No  FALLS: Has patient fallen in last 6 months?  See PT evaluation for details  LIVING ENVIRONMENT: Lives with: lives with their spouse Lives in: House/apartment  PLOF:  Level of assistance: Independent with ADLs, Independent with IADLs Employment: On disability  PATIENT GOALS    to improve speech and swallow function  SUBJECTIVE STATEMENT: Pt seen after PT and reports significant fatigue "I am wore out" Pt accompanied by: self  OBJECTIVE:   TODAY'S TREATMENT:  Skilled treatment session focused on pt's dysphagia and dysarthria. SLP facilitated session by providing the following interventions:   CONVERSATION  Begin with structured tasks Every single word with INTENT- Supervision A Rare Min A  to achieve ~ 90% speech intelligibility at the unknown conversation level in a moderately noisy environment over 45 minutes   PATIENT EDUCATION: Education details: see above Person educated: Patient Education method: Explanation Education comprehension: needs further education   HOME EXERCISE PROGRAM: Speak Out assignments   GOALS: Goals reviewed with patient? Yes  SHORT TERM GOALS: Target date: 10 sessions  With Rare Min A, patient will demo HEP for motor speech  accurately in 5/7 opportunities.  Baseline: Goal status: INITIAL: inconsistent completion  2.  To determine optimal resistance levels for Respiratory Muscle Training (RMT) for improving increase hyolaryngeal elevation and strengthen cough for airway clearance, patient will participate in evaluation (and re-assessment as needed) of maximum expiratory pressure (MEP). Baseline:  Goal status: INITIAL: deferred at this time  3.  With Min A, patient will complete 3 sets of 10 repetitions with EMST set at TBD cmH2O with self-reported effortful of 7 out of 10. Baseline:  Goal status: INITIAL: deferred at this time  4.  With Min A, patient will improve speech intelligibility for  phrase by controlling rate of speech, over-articulation, and increased loudness to achieve 75% intelligibility.  Baseline:  Goal status: INITIAL: progress made towards goals   LONG TERM GOALS: Target date: 11/21/2023  With Supervision A, patient will participate in 15 minutes conversation, maintaining average loudness of 75 dB and loud, good quality voice.   Baseline:  Goal status: INITIAL: progress made  2.  Patient will report improved communication effectiveness as measured by Communicative Effectiveness Survey. Baseline:  Goal status: INITIAL: progress made  3.  Patient will participate in objective swallowing evaluation (MBSS) to identify safest diet recommendation as well as therapeutic targets.  Baseline:  Goal status: INITIAL: deferred as s/s decreased when following general aspiration precautions  4.  Patient will improve perception of swallowing as indicated by an improvement in EAT-10 score by 12 weeks from initial swallowing therapy session.  Baseline:  Goal status: INITIAL: pt reports improvement when following general aspiration precautions.   ASSESSMENT:  CLINICAL IMPRESSION: Patient is a 68 year old male y.o.  who was seen today for a speech language treatment d/t Parkinson's Disease. 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    Pt noted with increased open mouth posture but reports that he is practicing more. As a result his overall speech intelligibility appeared improved today. See the above treatment note for details.   OBJECTIVE IMPAIRMENTS include expressive language, dysarthria, voice disorder, and dysphagia. These impairments are limiting patient from managing medications, managing appointments, managing finances, household responsibilities, ADLs/IADLs, effectively communicating at home and in community, and safety when swallowing. Factors affecting potential to achieve goals and functional outcome are co-morbidities, medical prognosis, and severity of impairments. Patient will benefit from skilled SLP services to address above impairments and improve overall function.  REHAB POTENTIAL: Good  PLAN: SLP FREQUENCY: 2x/week  SLP DURATION: 12 weeks  PLANNED INTERVENTIONS: Aspiration precaution training, Pharyngeal strengthening exercises, Diet toleration management , Trials of upgraded texture/liquids, Oral motor exercises, Functional tasks, Multimodal communication approach, SLP instruction and feedback, Compensatory strategies, and Patient/family education   Chirstine Defrain B. Dreama Saa, M.S., CCC-SLP, Tree surgeon Certified Brain Injury Specialist [facility]  [facility] Rehabilitation Services Office [phone] Ascom [phone] Fax [phone]

## 2023-10-19 NOTE — Therapy (Signed)
 OUTPATIENT PHYSICAL THERAPY NEURO TREATMENT  Patient Name: Edgar Huynh MRN: [id number] DOB:[birth year], 68 y.o., male Today's Date: 10/19/2023   PCP: Wilford Corner, PA-C  REFERRING PROVIDER: Wilford Corner, PA-C   END OF SESSION:  PT End of Session - 10/19/23 1108     Visit Number 13    Number of Visits 24    Date for PT Re-Evaluation 11/23/23    PT Start Time 1105    PT Stop Time 1145    PT Time Calculation (min) 40 min    Equipment Utilized During Treatment Gait belt    Activity Tolerance Patient tolerated treatment well    Behavior During Therapy WFL for tasks assessed/performed              Past Medical History:  Diagnosis Date   Anxiety    COPD (chronic obstructive pulmonary disease) (HCC)    Depression    GERD (gastroesophageal reflux disease)    Glaucoma    History of colonic polyps    History of kidney stones    Hypercholesteremia    Hyperlipidemia    Hypertension    Neuropathy    Osteoarthritis of left knee    Sleep apnea    Stroke (HCC) 12/10/1996   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   TIA (transient ischemic attack)    Type 1 diabetes (HCC)    on insulin pump   Past Surgical History:  Procedure Laterality Date   CAROTID ARTERY ANGIOPLASTY Right 10/18/2004   CATARACT EXTRACTION, BILATERAL Bilateral    COLONOSCOPY     COLONOSCOPY WITH PROPOFOL N/A 08/25/2018   Procedure: COLONOSCOPY WITH PROPOFOL;  Surgeon: Scot Jun, MD;  Location: [facility] ENDOSCOPY;  Service: Endoscopy;  Laterality: N/A;   CYSTOSCOPY     ESOPHAGOGASTRODUODENOSCOPY     ESOPHAGOGASTRODUODENOSCOPY N/A 09/01/2022   Procedure: ESOPHAGOGASTRODUODENOSCOPY (EGD);  Surgeon: Toledo, Boykin Nearing, MD;  Location: [facility] ENDOSCOPY;  Service: Gastroenterology;  Laterality: N/A;   ESOPHAGOGASTRODUODENOSCOPY (EGD) WITH PROPOFOL N/A 08/25/2018   Procedure: ESOPHAGOGASTRODUODENOSCOPY (EGD) WITH PROPOFOL;  Surgeon: Scot Jun, MD;  Location: [facility]  ENDOSCOPY;  Service: Endoscopy;  Laterality: N/A;   EYE SURGERY     PARTIAL KNEE ARTHROPLASTY Left 01/05/2022   Procedure: UNICOMPARTMENTAL KNEE;  Surgeon: Christena Flake, MD;  Location: [facility] ORS;  Service: Orthopedics;  Laterality: Left;   WISDOM TOOTH EXTRACTION     Patient Active Problem List   Diagnosis Date Noted   DKA (diabetic ketoacidosis) (HCC) 02/11/2023   Known medical problems 02/11/2023   Colitis 02/11/2023   SOB (shortness of breath) 04/09/2021   HLD (hyperlipidemia) 04/09/2021   HTN (hypertension) 04/09/2021   Stroke (HCC) 04/09/2021   Chest pain 04/09/2021   Depression with anxiety 04/09/2021   Anxiety disorder 09/23/2017   MDD (major depressive disorder), single episode, severe with psychotic features (HCC) 03/01/2017   Adjustment disorder with mixed anxiety and depressed mood 02/28/2017   Psychosis, affective (HCC) 02/28/2017   Diabetic retinopathy (HCC) 10/03/2012   GERD (gastroesophageal reflux disease) 04/04/2012   Diabetic neuropathy (HCC) 04/04/2012   DM (diabetes mellitus) type I, controlled, with peripheral vascular disorder (HCC) 02/04/2009   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 No pain reported. No other updates   Pt accompanied by: self  PERTINENT HISTORY:   From Neurologist "Mr. Meikle mentioned that he is taking his medications as prescribed. 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   Concern for myasthenia gravis vs. parkinsonism in patient presenting with multiple neurological symptoms of facial drooping, difficulty swallowing, decrease speech volume, stiffness and short gait, shortness of breath and coughing. Patient states the symptoms started in 04/2023. Patient denise fluctuating droopy eyelids, starting new medications around the time of onset of symptoms. Patient does have a positive family history of Parkinson's disease in father. Negative for acute stroke, per 06/2023 MRI"   PAIN:  Are you having pain? No and occasional knee pain with falls >6 months ago   PRECAUTIONS: None  RED FLAGS: None   WEIGHT BEARING RESTRICTIONS: No  FALLS: Has patient fallen in last 6 months? No and several near falls  poor corrections of anterior LOB   LIVING ENVIRONMENT: Lives with: lives with their spouse Lives in: House/apartment Stairs: Yes: Internal: 13 steps; on left going up and External: 2 steps; on left going up Has following equipment at home: None  PLOF: Independent, Independent with basic ADLs, Independent with gait, and Independent with transfers  PATIENT GOALS: improve groggy drowsy feeling. Improve endurance and strength.   OBJECTIVE:  Note: Objective measures were completed at Evaluation unless otherwise noted.  DIAGNOSTIC FINDINGS:   MRI 11/19  IMPRESSION: Scattered and confluent T2 hyperintense signal in the periventricular white matter, likely the sequela of moderate chronic small vessel ischemic disease. Remote lacunar infarcts in the pons and left caudate. No acute intracranial process. No evidence of acute or subacute infarct.  COGNITION: Overall cognitive status: Within functional limits for tasks assessed   SENSATION: WFL States increased sensitivity in BLE  COORDINATION: Dysdidodactic kinesia: WFL  Finger ot nose WFL  Ankle to knee: Mild decreased speed on the L and mild under  shooting.    EDEMA:  WFL   MUSCLE TONE:  Grossly WFL  POSTURE: rounded shoulders and forward head  LOWER EXTREMITY ROM:     WFL  LOWER EXTREMITY MMT:    MMT Right Eval Left Eval  Hip flexion 4- 4-  Hip extension    Hip abduction 4- 4-  Hip adduction 4 4  Hip internal rotation    Hip external rotation    Knee flexion 4 4  Knee extension 4+ 4+  Ankle dorsiflexion 4+ 4+  Ankle plantarflexion    Ankle inversion    Ankle eversion    (Blank rows = not tested)  BED MOBILITY:  Sit to supine Complete Independence Supine to sit Complete Independence  TRANSFERS: Assistive device utilized: None  Sit to stand: Complete Independence Stand to sit: Complete Independence Chair to chair: Complete Independence Floor:  TBD  RAMP:  Level of Assistance: Complete Independence Assistive device utilized: None Ramp Comments:   CURB:  Level of Assistance: Complete Independence Assistive device utilized: None Curb Comments:   STAIRS: Level of Assistance: Complete Independence Stair Negotiation Technique: Alternating Pattern  with No Rails Number of Stairs: 4  Height of Stairs: 6  Comments: WFL  GAIT: Gait pattern:   , step through pattern, decreased arm swing- Right, decreased arm swing- Left, decreased trunk rotation, and trunk flexed Distance walked: 196ft Assistive device utilized: None Level of assistance: Complete Independence Comments:   FUNCTIONAL TESTS:  5 times sit to stand: 11.51 Timed up and go (TUG): 8.25 6 minute walk test: 692ft 10 meter walk test: 7.9 and 8.3  Berg Balance Scale: 49 Functional gait assessment: 25  PATIENT SURVEYS:  FOTO 53                                                                                                                              TREATMENT DATE: 09/14/2023   Sit<>stand without UE 2 x 10   Seated LAQ 2x 12, 4#AW  Seated march 2 x 15, 4#AW HS curl seated BTB , 2 x12  Seated trunk rotation with shoulder  abduction/adduction 2x 12 bil to high five contralateral hand   Forward/reverse Step over half bolster with 4# AW 2 x 15 bil   Weighted gait training with 4#AW 2x 466ft. Mild foot drag noted on second bout with fatigue.   Foot tap on 6inch step with 4# AW x 15 bil  Throughout session, PT provided supervision assist for safety unless otherwise noted. No significant LOB onthis day, but mild lateral lean initially with reverse step over bolster with LLE.   PATIENT EDUCATION: Education details: POC, HEP Pt educated throughout session about proper posture and technique with exercises. Improved exercise technique, movement at target joints, use of target muscles after min to mod verbal, visual, tactile cues.  Person educated: Patient Education method: Explanation Education comprehension: verbalized understanding  HOME EXERCISE PROGRAM: Access Code: [email or web address] URL: [email or web address] Date: 09/05/2023 Prepared by: Grier Rocher  Exercises - Sit to Stand  - 1 x daily - 2-3 x weekly - 2 sets - 10 reps - Standing Tandem Balance with Counter Support  - 1 x daily - 2-3 x weekly - 4 sets - 10 reps - 15 hold - Single Leg Stance with Support  - 1 x daily - 2-3 x weekly - 4 sets - 10 reps - 10 hold - Standing Hip Abduction with Counter Support  - 1 x daily - 2-3 x weekly - 2 sets - 10 reps - Standing March with Counter Support  - 1 x daily - 2-3 x weekly - 2 sets - 10 reps  GOALS: Goals reviewed with patient? Yes   SHORT TERM GOALS: Target date: 09/29/2023  Patient will be independent in home exercise program to improve strength/mobility for better functional independence with ADLs. Baseline: to be given at session 2.  Goal status: INITIAL   LONG TERM GOALS: Target date: 11/24/2023  Patient will increase FOTO score  by equal to or greater than  4 points  to demonstrate statistically significant improvement in mobility and quality of life.  Baseline: 53 10/03/23: 53 Goal  status: IN PROGRESS  2.  Patient (> 63 years old) will improve SLS by < 15 seconds indicating an increased LE strength and improved balance. Baseline: 4 sec bil 10/03/23: 10 sec R, 6 sec L Goal status: IN PROGRESS  3.  Patient will increase Berg Balance score  by > 4 points to demonstrate decreased fall risk during functional activities Baseline: 49 10/03/23: 56 Goal status: MET  4.  Patient will increase 6 min walk test by 175ft  as to improve gait speed for better community ambulation and to reduce fall risk. Baseline: 647ft for 3:59min  10/03/23: 1115'  Goal status: MET  5.  Patient will increase FGA score to >27 as to demonstrate reduced fall risk and improved dynamic gait balance for better safety with community/home ambulation.  Baseline: 25 10/03/23: 28 Goal status: MET   ASSESSMENT:  CLINICAL IMPRESSION:  PT treatment focused on improved large amplitude movements and increased reciprocal movement patterns. PT provided multiple prolonged therapeutic rest breaks due to BLE. PT treatment focused on improved step length in all planes on this day in weighted position. Pt able to clear BLE in all tasks except in second bout of prolonged weighted weighted gait.  Pt will continue to benefit from skilled therapy to address remaining deficits in order to improve overall QoL and return to PLOF.     OBJECTIVE IMPAIRMENTS: Abnormal gait, cardiopulmonary status limiting activity, decreased activity tolerance, decreased balance, decreased cognition, decreased endurance, decreased mobility, increased fascial restrictions, and increased muscle spasms.   ACTIVITY LIMITATIONS: carrying, lifting, bending, stairs, transfers, and locomotion level  PARTICIPATION LIMITATIONS: community activity and yard work  PERSONAL FACTORS: Age, Fitness, and 1-2 comorbidities: CVA and PD vs myasthenia gravis   are also affecting patient's functional outcome.   REHAB POTENTIAL: Excellent  CLINICAL DECISION  MAKING: Stable/uncomplicated  EVALUATION COMPLEXITY: Low  PLAN:  PT FREQUENCY: 1-2x/week  PT DURATION: 12 weeks  PLANNED INTERVENTIONS: 97110-Therapeutic exercises, 97530- Therapeutic activity, [id number]- Neuromuscular re-education, 97535- Self Care, 51884- Manual therapy, [phone]- Gait training, [phone]- Splinting, Patient/Family education, Balance training, Stair training, Taping, Joint mobilization, Joint manipulation, DME instructions, Wheelchair mobility training, Cryotherapy, and Moist heat  PLAN FOR NEXT SESSION:   Continued POC Improved activity tolerance and BLE strengthening  High level balance training.    Grier Rocher PT, DPT  Physical Therapist - [facility]  11:09 AM 10/19/23

## 2023-10-21 ENCOUNTER — Emergency Department
Admission: EM | Admit: 2023-10-21 | Discharge: 2023-10-21 | Disposition: A | Discharge status: Home / Self Care | Attending: Emergency Medicine | Admitting: Emergency Medicine

## 2023-10-21 ENCOUNTER — Encounter: Payer: Self-pay | Admitting: Emergency Medicine

## 2023-10-21 ENCOUNTER — Other Ambulatory Visit: Payer: Self-pay

## 2023-10-21 DIAGNOSIS — R739 Hyperglycemia, unspecified: Secondary | ICD-10-CM | POA: Diagnosis present

## 2023-10-21 DIAGNOSIS — Z5321 Procedure and treatment not carried out due to patient leaving prior to being seen by health care provider: Secondary | ICD-10-CM | POA: Diagnosis not present

## 2023-10-21 DIAGNOSIS — R1110 Vomiting, unspecified: Secondary | ICD-10-CM | POA: Insufficient documentation

## 2023-10-21 DIAGNOSIS — Z794 Long term (current) use of insulin: Secondary | ICD-10-CM | POA: Insufficient documentation

## 2023-10-21 LAB — CBC
HCT: 49.8 % (ref 39.0–52.0)
Hemoglobin: 16.8 g/dL (ref 13.0–17.0)
MCH: 30.9 pg (ref 26.0–34.0)
MCHC: 33.7 g/dL (ref 30.0–36.0)
MCV: 91.5 fL (ref 80.0–100.0)
Platelets: 270 10*3/uL (ref 150–400)
RBC: 5.44 MIL/uL (ref 4.22–5.81)
RDW: 13.2 % (ref 11.5–15.5)
WBC: 8.6 10*3/uL (ref 4.0–10.5)
nRBC: 0 % (ref 0.0–0.2)

## 2023-10-21 LAB — CBG MONITORING, ED: Glucose-Capillary: 139 mg/dL — ABNORMAL HIGH (ref 70–99)

## 2023-10-21 LAB — BASIC METABOLIC PANEL
Anion gap: 12 (ref 5–15)
BUN: 14 mg/dL (ref 8–23)
CO2: 24 mmol/L (ref 22–32)
Calcium: 9.6 mg/dL (ref 8.9–10.3)
Chloride: 102 mmol/L (ref 98–111)
Creatinine, Ser: 0.96 mg/dL (ref 0.61–1.24)
GFR, Estimated: >60 mL/min (ref >60)
Glucose, Bld: 140 mg/dL — ABNORMAL HIGH (ref 70–99)
Potassium: 3.4 mmol/L — ABNORMAL LOW (ref 3.5–5.1)
Sodium: 138 mmol/L (ref 135–145)

## 2023-10-21 NOTE — ED Triage Notes (Signed)
 Patient to ED via POV for hyperglycemia. States sugars have been in the 400's for the past 2 days. States he has been taking extra insulin for same. States he had one episode of vomiting last PM after dinner.   Last took 20u of short acting insulin appriox 3 hr PTA.

## 2023-10-21 NOTE — ED Notes (Signed)
 Pt to desk and advised he was leaving.

## 2023-10-24 ENCOUNTER — Ambulatory Visit: Payer: Medicare Other | Admitting: Speech Pathology

## 2023-10-26 ENCOUNTER — Ambulatory Visit: Payer: Medicare Other | Admitting: Physical Therapy

## 2023-10-26 ENCOUNTER — Ambulatory Visit: Payer: Medicare Other | Admitting: Speech Pathology

## 2023-10-26 DIAGNOSIS — R471 Dysarthria and anarthria: Secondary | ICD-10-CM

## 2023-10-26 DIAGNOSIS — R2689 Other abnormalities of gait and mobility: Secondary | ICD-10-CM

## 2023-10-26 DIAGNOSIS — M6281 Muscle weakness (generalized): Secondary | ICD-10-CM

## 2023-10-26 NOTE — Therapy (Signed)
 OUTPATIENT SPEECH LANGUAGE PATHOLOGY  PARKINSON'S TREATMENT NOTE     Patient Name: Edgar Huynh MRN: [id number] DOB:June 17, 1956, 68 y.o., male Today's Date: 10/26/2023  PCP: Wilford Corner REFERRING PROVIDER: Cristopher Peru, MD    End of Session - 10/26/23 1148     Visit Number 13    Number of Visits 25    Date for SLP Re-Evaluation 11/21/23    Authorization Type [employer]    Authorization Time Period 09/05/2023 thur 11/28/2023    Authorization - Visit Number 13    Authorization - Number of Visits 24    Progress Note Due on Visit 30    SLP Start Time 1145    SLP Stop Time  1230    SLP Time Calculation (min) 45 min    Activity Tolerance Patient tolerated treatment well              Past Medical History:  Diagnosis Date   Anxiety    COPD (chronic obstructive pulmonary disease) (HCC)    Depression    GERD (gastroesophageal reflux disease)    Glaucoma    History of colonic polyps    History of kidney stones    Hypercholesteremia    Hyperlipidemia    Hypertension    Neuropathy    Osteoarthritis of left knee    Sleep apnea    Stroke (HCC) 12/10/1996   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   TIA (transient ischemic attack)    Type 1 diabetes (HCC)    on insulin pump   Past Surgical History:  Procedure Laterality Date   CAROTID ARTERY ANGIOPLASTY Right 10/18/2004   CATARACT EXTRACTION, BILATERAL Bilateral    COLONOSCOPY     COLONOSCOPY WITH PROPOFOL N/A 08/25/2018   Procedure: COLONOSCOPY WITH PROPOFOL;  Surgeon: Scot Jun, MD;  Location: [facility] ENDOSCOPY;  Service: Endoscopy;  Laterality: N/A;   CYSTOSCOPY     ESOPHAGOGASTRODUODENOSCOPY     ESOPHAGOGASTRODUODENOSCOPY N/A 09/01/2022   Procedure: ESOPHAGOGASTRODUODENOSCOPY (EGD);  Surgeon: Toledo, Boykin Nearing, MD;  Location: [facility] ENDOSCOPY;  Service: Gastroenterology;  Laterality: N/A;   ESOPHAGOGASTRODUODENOSCOPY (EGD) WITH PROPOFOL N/A 08/25/2018   Procedure:  ESOPHAGOGASTRODUODENOSCOPY (EGD) WITH PROPOFOL;  Surgeon: Scot Jun, MD;  Location: [facility] ENDOSCOPY;  Service: Endoscopy;  Laterality: N/A;   EYE SURGERY     PARTIAL KNEE ARTHROPLASTY Left 01/05/2022   Procedure: UNICOMPARTMENTAL KNEE;  Surgeon: Christena Flake, MD;  Location: [facility] ORS;  Service: Orthopedics;  Laterality: Left;   WISDOM TOOTH EXTRACTION     Patient Active Problem List   Diagnosis Date Noted   DKA (diabetic ketoacidosis) (HCC) 02/11/2023   Known medical problems 02/11/2023   Colitis 02/11/2023   SOB (shortness of breath) 04/09/2021   HLD (hyperlipidemia) 04/09/2021   HTN (hypertension) 04/09/2021   Stroke (HCC) 04/09/2021   Chest pain 04/09/2021   Depression with anxiety 04/09/2021   Anxiety disorder 09/23/2017   MDD (major depressive disorder), single episode, severe with psychotic features (HCC) 03/01/2017   Adjustment disorder with mixed anxiety and depressed mood 02/28/2017   Psychosis, affective (HCC) 02/28/2017   Diabetic retinopathy (HCC) 10/03/2012   GERD (gastroesophageal reflux disease) 04/04/2012   Diabetic neuropathy (HCC) 04/04/2012   DM (diabetes mellitus) type I, controlled, with peripheral vascular disorder (HCC) 02/04/2009   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 Current working diagnosis of Parkinson's Disease with concern for facial drooping, difficulty swallowing, hypophonia, and family history of Parkinson's disease. Edgar Huynh also experiences severe depression as well as  complex motor tics d/t suspicion for longstanding tic disorder with component of tardive dyskinesia in setting of neuroleptics (Ability, risperidone).   DIAGNOSTIC FINDINGS:   Modified Barium Swallow Study 06/02/2018 "Mild pharyngeal dysphagia characterized by delayed pharyngeal swallow initiation, reduced pharyngeal pressure generation, mild pharyngeal residue, transient laryngeal penetration (nectar-thick and thin liquids), and aspiration X1." Of note, report documents "Edgar Huynh compliant of weak voice" recommend ENT follow up  MR Brain w wo contrast 07/05/2023 - No acute intracranial process. No evidence of acute or subacute  infarct.   Single fiber EMG with [facility] - scheduled 10/12/2023   PAIN:  Are you having pain? No  FALLS: Has patient fallen in last 6 months?  See Edgar Huynh evaluation for details  LIVING ENVIRONMENT: Lives with: lives with their spouse Lives in: House/apartment  PLOF:  Level of assistance: Independent with ADLs, Independent with IADLs Employment: On disability  PATIENT GOALS    to improve speech and swallow function  SUBJECTIVE STATEMENT: Edgar Huynh provides information on recent visit to ED d/t high blood sugar and reports that he needed another insulin pump Edgar Huynh accompanied by: self  OBJECTIVE:   TODAY'S TREATMENT:  Skilled treatment session focused on Edgar Huynh's dysphagia and dysarthria. SLP facilitated session by providing the following interventions:  Edgar Huynh alerted this writer to recent appt with [facility] neurology - he reports that he has been referred to a neuromuscular specialist   He further reports that "I have been getting up earlier at 7am and reports he feels he has greater energy throughout the day as a result" as well as "I haven't been choking as much" as he continues to report sitting upright when eating. He further reports that during his doctor's appts he didn't need to repeat himself because "I think I am more cognizant of trying to talk louder."  While Edgar Huynh didn't  experience any immediate harm, education provided on not leaving ED without being see for medical emergencies  CONVERSATION  Begin with structured tasks Every single word with INTENT- Supervision A Rare Min A  to achieve ~ 90% speech intelligibility at the unknown conversation level in a moderately noisy environment over 45 minutes   PATIENT EDUCATION: Education details: see above Person educated: Patient Education method: Explanation Education comprehension: needs further education   HOME EXERCISE PROGRAM: Speak Out assignments   GOALS: Goals reviewed with patient? Yes  SHORT TERM GOALS: Target date: 10 sessions  With Rare Min A, patient will demo HEP for motor speech accurately in 5/7 opportunities.  Baseline: Goal status: INITIAL: inconsistent completion  2.  To determine optimal resistance levels for Respiratory Muscle Training (RMT) for improving increase hyolaryngeal elevation and strengthen cough for airway clearance, patient will participate in evaluation (and re-assessment as needed) of maximum expiratory pressure (MEP). Baseline:  Goal status: INITIAL: deferred at this time  3.  With Min A, patient will complete 3 sets of 10 repetitions with EMST set at TBD cmH2O with self-reported effortful of 7 out of 10. Baseline:  Goal status: INITIAL: deferred at this time  4.  With Min A, patient will improve speech intelligibility for  phrase by controlling rate of speech, over-articulation, and increased loudness to achieve 75% intelligibility.  Baseline:  Goal status: INITIAL: progress made towards goals   LONG TERM GOALS: Target date: 11/21/2023  With Supervision A, patient will participate in 15 minutes conversation, maintaining average loudness of 75 dB and loud, good quality voice.   Baseline:  Goal status: INITIAL: progress made  2.  Patient will report improved communication effectiveness as measured by Communicative Effectiveness Survey. Baseline:  Goal  status: INITIAL: progress made  3.  Patient will participate in objective swallowing evaluation (MBSS) to identify safest diet recommendation as well as therapeutic targets.  Baseline:  Goal status: INITIAL: deferred as s/s decreased when following general aspiration precautions   4.  Patient will improve perception of swallowing as indicated by an improvement in EAT-10 score by 12 weeks from initial swallowing therapy session.  Baseline:  Goal status: INITIAL: Edgar Huynh reports improvement when following general aspiration precautions.   ASSESSMENT:  CLINICAL IMPRESSION: Patient is a 68 year old male y.o.  who was seen today for a speech language treatment d/t Parkinson's Disease. 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    Edgar Huynh noted with increased open mouth posture but reports that he is practicing more. As a result his overall speech intelligibility appeared improved today. See the above treatment note for details.   OBJECTIVE IMPAIRMENTS include expressive language, dysarthria, voice disorder, and dysphagia. These impairments are limiting patient from managing medications, managing appointments, managing finances, household responsibilities, ADLs/IADLs, effectively communicating at home and in community, and safety when swallowing. Factors affecting potential to achieve goals and functional outcome are co-morbidities, medical prognosis, and severity of impairments. Patient will benefit from skilled SLP services to address above impairments and improve overall function.  REHAB POTENTIAL: Good  PLAN: SLP FREQUENCY: 2x/week  SLP DURATION: 12 weeks  PLANNED INTERVENTIONS: Aspiration precaution training, Pharyngeal strengthening exercises, Diet toleration management , Trials of upgraded texture/liquids, Oral motor exercises, Functional tasks, Multimodal communication approach, SLP  instruction and feedback, Compensatory strategies, and Patient/family education   Jaemarie Hochberg B. Dreama Saa, M.S., CCC-SLP, Tree surgeon Certified Brain Injury Specialist [facility]  [facility] Rehabilitation Services Office [phone] Ascom [phone] Fax [phone]

## 2023-10-26 NOTE — Therapy (Signed)
 OUTPATIENT PHYSICAL THERAPY NEURO TREATMENT  Patient Name: Edgar Huynh MRN: [id number] DOB:July 13, 1956, 68 y.o., male Today's Date: 10/26/2023   PCP: Wilford Corner, PA-C  REFERRING PROVIDER: Wilford Corner, PA-C   END OF SESSION:  PT End of Session - 10/26/23 1106     Visit Number 14    Number of Visits 24    Date for PT Re-Evaluation 11/23/23    PT Start Time 1105    PT Stop Time 1145    PT Time Calculation (min) 40 min    Equipment Utilized During Treatment Gait belt    Activity Tolerance Patient tolerated treatment well    Behavior During Therapy WFL for tasks assessed/performed              Past Medical History:  Diagnosis Date   Anxiety    COPD (chronic obstructive pulmonary disease) (HCC)    Depression    GERD (gastroesophageal reflux disease)    Glaucoma    History of colonic polyps    History of kidney stones    Hypercholesteremia    Hyperlipidemia    Hypertension    Neuropathy    Osteoarthritis of left knee    Sleep apnea    Stroke (HCC) 12/10/1996   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   TIA (transient ischemic attack)    Type 1 diabetes (HCC)    on insulin pump   Past Surgical History:  Procedure Laterality Date   CAROTID ARTERY ANGIOPLASTY Right 10/18/2004   CATARACT EXTRACTION, BILATERAL Bilateral    COLONOSCOPY     COLONOSCOPY WITH PROPOFOL N/A 08/25/2018   Procedure: COLONOSCOPY WITH PROPOFOL;  Surgeon: Scot Jun, MD;  Location: [facility] ENDOSCOPY;  Service: Endoscopy;  Laterality: N/A;   CYSTOSCOPY     ESOPHAGOGASTRODUODENOSCOPY     ESOPHAGOGASTRODUODENOSCOPY N/A 09/01/2022   Procedure: ESOPHAGOGASTRODUODENOSCOPY (EGD);  Surgeon: Toledo, Boykin Nearing, MD;  Location: [facility] ENDOSCOPY;  Service: Gastroenterology;  Laterality: N/A;   ESOPHAGOGASTRODUODENOSCOPY (EGD) WITH PROPOFOL N/A 08/25/2018   Procedure: ESOPHAGOGASTRODUODENOSCOPY (EGD) WITH PROPOFOL;  Surgeon: Scot Jun, MD;  Location: [facility]  ENDOSCOPY;  Service: Endoscopy;  Laterality: N/A;   EYE SURGERY     PARTIAL KNEE ARTHROPLASTY Left 01/05/2022   Procedure: UNICOMPARTMENTAL KNEE;  Surgeon: Christena Flake, MD;  Location: [facility] ORS;  Service: Orthopedics;  Laterality: Left;   WISDOM TOOTH EXTRACTION     Patient Active Problem List   Diagnosis Date Noted   DKA (diabetic ketoacidosis) (HCC) 02/11/2023   Known medical problems 02/11/2023   Colitis 02/11/2023   SOB (shortness of breath) 04/09/2021   HLD (hyperlipidemia) 04/09/2021   HTN (hypertension) 04/09/2021   Stroke (HCC) 04/09/2021   Chest pain 04/09/2021   Depression with anxiety 04/09/2021   Anxiety disorder 09/23/2017   MDD (major depressive disorder), single episode, severe with psychotic features (HCC) 03/01/2017   Adjustment disorder with mixed anxiety and depressed mood 02/28/2017   Psychosis, affective (HCC) 02/28/2017   Diabetic retinopathy (HCC) 10/03/2012   GERD (gastroesophageal reflux disease) 04/04/2012   Diabetic neuropathy (HCC) 04/04/2012   DM (diabetes mellitus) type I, controlled, with peripheral vascular disorder (HCC) 02/04/2009   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 Feels like he has more energy today.  No pain reported. No other updates   Pt accompanied by: self  PERTINENT HISTORY:   From Neurologist "Mr. Jowett mentioned that he is taking his medications as prescribed. 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   Concern for myasthenia gravis vs. parkinsonism in patient presenting with multiple neurological symptoms of facial drooping, difficulty swallowing, decrease speech volume, stiffness and short gait, shortness of breath and coughing. Patient states the symptoms started in 04/2023. Patient denise fluctuating droopy eyelids, starting new medications around the time of onset of symptoms. Patient does have a positive family history of Parkinson's disease in father. Negative for acute stroke, per 06/2023 MRI"   PAIN:  Are you having pain? No and occasional knee pain with falls >6 months ago   PRECAUTIONS: None  RED FLAGS: None   WEIGHT BEARING RESTRICTIONS: No  FALLS: Has patient fallen in last 6 months? No and several near falls  poor corrections of anterior LOB   LIVING ENVIRONMENT: Lives with: lives with their spouse Lives in: House/apartment Stairs: Yes: Internal: 13 steps; on left going up and External: 2 steps; on left going up Has following equipment at home: None  PLOF: Independent, Independent with basic ADLs, Independent with gait, and Independent with transfers  PATIENT GOALS: improve groggy drowsy feeling. Improve endurance and strength.   OBJECTIVE:  Note: Objective measures were completed at Evaluation unless otherwise noted.  DIAGNOSTIC FINDINGS:   MRI 11/19  IMPRESSION: Scattered and confluent T2 hyperintense signal in the periventricular white matter, likely the sequela of moderate chronic small vessel ischemic disease. Remote lacunar infarcts in the pons and left caudate. No acute intracranial process. No evidence of acute or subacute infarct.  COGNITION: Overall cognitive status: Within functional limits for tasks assessed   SENSATION: WFL States increased sensitivity in BLE  COORDINATION: Dysdidodactic kinesia: WFL  Finger ot nose WFL  Ankle to knee: Mild  decreased speed on the L and mild under shooting.    EDEMA:  WFL   MUSCLE TONE:  Grossly WFL  POSTURE: rounded shoulders and forward head  LOWER EXTREMITY ROM:     WFL  LOWER EXTREMITY MMT:    MMT Right Eval Left Eval  Hip flexion 4- 4-  Hip extension    Hip abduction 4- 4-  Hip adduction 4 4  Hip internal rotation    Hip external rotation    Knee flexion 4 4  Knee extension 4+ 4+  Ankle dorsiflexion 4+ 4+  Ankle plantarflexion    Ankle inversion    Ankle eversion    (Blank rows = not tested)  BED MOBILITY:  Sit to supine Complete Independence Supine to sit Complete Independence  TRANSFERS: Assistive device utilized: None  Sit to stand: Complete Independence Stand to sit: Complete Independence Chair to chair: Complete Independence Floor:  TBD  RAMP:  Level of Assistance: Complete Independence Assistive device utilized: None Ramp Comments:   CURB:  Level of Assistance: Complete Independence Assistive device utilized: None Curb Comments:   STAIRS: Level of Assistance: Complete Independence Stair Negotiation Technique: Alternating Pattern  with No Rails Number of Stairs: 4  Height of Stairs: 6  Comments: WFL  GAIT: Gait pattern:   , step through pattern, decreased arm swing- Right, decreased arm swing- Left, decreased trunk rotation, and trunk flexed Distance walked: 155ft Assistive device utilized: None Level of assistance:  Complete Independence Comments:   FUNCTIONAL TESTS:  5 times sit to stand: 11.51 Timed up and go (TUG): 8.25 6 minute walk test: 661ft 10 meter walk test: 7.9 and 8.3  Berg Balance Scale: 49 Functional gait assessment: 25  PATIENT SURVEYS:  FOTO 53                                                                                                                              TREATMENT DATE: 09/14/2023  Nustep reciprocal movement training x 6 min level 1-4. Cues for consistent SPM >45 through varied resistance and  improved trunk rotation.   Standing at rail Hip extension x 15  Hip flexion x 15 bil   Forward/reverse gait without resistance x 43ft x 5 Side stepping 13ft x 5  Stagger stance with UE swing and weight shift. X 15 bil   Gait training with 2.5AW on wrist with emphasis on UE swing and trunk rotation. 2 x 453ft. Tactile cues for improve reciprocal movement at the trunk.   Cross body reach with palm up and lateral weight shift x 10 bil   Sit<>stand with UE swing into extension 2 x 11 on this day.   Throughout session, supervision assist provided by PT safety for standing activities, but no LOB noted throughout session.   PATIENT EDUCATION: Education details: POC, HEP Pt educated throughout session about proper posture and technique with exercises. Improved exercise technique, movement at target joints, use of target muscles after min to mod verbal, visual, tactile cues.  Person educated: Patient Education method: Explanation Education comprehension: verbalized understanding  HOME EXERCISE PROGRAM: Access Code: [email or web address] URL: [email or web address] Date: 09/05/2023 Prepared by: Grier Rocher  Exercises - Sit to Stand  - 1 x daily - 2-3 x weekly - 2 sets - 10 reps - Standing Tandem Balance with Counter Support  - 1 x daily - 2-3 x weekly - 4 sets - 10 reps - 15 hold - Single Leg Stance with Support  - 1 x daily - 2-3 x weekly - 4 sets - 10 reps - 10 hold - Standing Hip Abduction with Counter Support  - 1 x daily - 2-3 x weekly - 2 sets - 10 reps - Standing March with Counter Support  - 1 x daily - 2-3 x weekly - 2 sets - 10 reps  GOALS: Goals reviewed with patient? Yes   SHORT TERM GOALS: Target date: 09/29/2023  Patient will be independent in home exercise program to improve strength/mobility for better functional independence with ADLs. Baseline: to be given at session 2.  Goal status: INITIAL   LONG TERM GOALS: Target date: 11/24/2023  Patient will increase  FOTO score  by equal to or greater than  4 points  to demonstrate statistically significant improvement in mobility and quality of life.  Baseline: 53 10/03/23: 53 Goal status: IN PROGRESS  2.  Patient (> 19 years old) will improve SLS by < 15 seconds indicating an  increased LE strength and improved balance. Baseline: 4 sec bil 10/03/23: 10 sec R, 6 sec L Goal status: IN PROGRESS  3.  Patient will increase Berg Balance score by > 4 points to demonstrate decreased fall risk during functional activities Baseline: 49 10/03/23: 56 Goal status: MET  4.  Patient will increase 6 min walk test by 165ft  as to improve gait speed for better community ambulation and to reduce fall risk. Baseline: 656ft for 3:55min  10/03/23: 1115'  Goal status: MET  5.  Patient will increase FGA score to >27 as to demonstrate reduced fall risk and improved dynamic gait balance for better safety with community/home ambulation.  Baseline: 25 10/03/23: 28 Goal status: MET   ASSESSMENT:  CLINICAL IMPRESSION:  PT treatment focused on improved large amplitude movements and UE swing reciprocally and into extension. Decreased fatigue noted on this day compared to prior sessions. Limited in trunk rotation throughout session, but able to correct with tactile cues from PT in UE and trunk.   Pt will continue to benefit from skilled therapy to address remaining deficits in order to improve overall QoL and return to PLOF.     OBJECTIVE IMPAIRMENTS: Abnormal gait, cardiopulmonary status limiting activity, decreased activity tolerance, decreased balance, decreased cognition, decreased endurance, decreased mobility, increased fascial restrictions, and increased muscle spasms.   ACTIVITY LIMITATIONS: carrying, lifting, bending, stairs, transfers, and locomotion level  PARTICIPATION LIMITATIONS: community activity and yard work  PERSONAL FACTORS: Age, Fitness, and 1-2 comorbidities: CVA and PD vs myasthenia gravis   are also  affecting patient's functional outcome.   REHAB POTENTIAL: Excellent  CLINICAL DECISION MAKING: Stable/uncomplicated  EVALUATION COMPLEXITY: Low  PLAN:  PT FREQUENCY: 1-2x/week  PT DURATION: 12 weeks  PLANNED INTERVENTIONS: 97110-Therapeutic exercises, 97530- Therapeutic activity, [id number]- Neuromuscular re-education, 97535- Self Care, 16109- Manual therapy, [phone]- Gait training, [phone]- Splinting, Patient/Family education, Balance training, Stair training, Taping, Joint mobilization, Joint manipulation, DME instructions, Wheelchair mobility training, Cryotherapy, and Moist heat  PLAN FOR NEXT SESSION:   Continued POC Improved activity tolerance and BLE strengthening  High level balance training.    Grier Rocher PT, DPT  Physical Therapist - [facility]  11:08 AM 10/26/23

## 2023-10-27 ENCOUNTER — Ambulatory Visit: Payer: Medicare Other | Admitting: Dermatology

## 2023-10-31 ENCOUNTER — Ambulatory Visit: Payer: Medicare Other | Admitting: Speech Pathology

## 2023-10-31 ENCOUNTER — Ambulatory Visit: Payer: Medicare Other

## 2023-10-31 ENCOUNTER — Ambulatory Visit: Payer: Medicare Other | Admitting: Physical Therapy

## 2023-10-31 DIAGNOSIS — M6281 Muscle weakness (generalized): Secondary | ICD-10-CM

## 2023-10-31 DIAGNOSIS — R41841 Cognitive communication deficit: Secondary | ICD-10-CM

## 2023-10-31 DIAGNOSIS — R2689 Other abnormalities of gait and mobility: Secondary | ICD-10-CM

## 2023-10-31 DIAGNOSIS — R471 Dysarthria and anarthria: Secondary | ICD-10-CM

## 2023-10-31 NOTE — Therapy (Signed)
 OUTPATIENT SPEECH LANGUAGE PATHOLOGY  PARKINSON'S TREATMENT NOTE DISCHARGE SUMMARY     Patient Name: Edgar Huynh MRN: [id number] DOB:13-Dec-1955, 68 y.o., male Today's Date: 10/31/2023  PCP: Wilford Corner REFERRING PROVIDER: Cristopher Peru, MD    End of Session - 10/31/23 1121     Visit Number 14    Number of Visits 25    Date for SLP Re-Evaluation 11/21/23    Authorization Type [employer]    Authorization Time Period 09/05/2023 thur 11/28/2023    Authorization - Visit Number 14    Authorization - Number of Visits 24    Progress Note Due on Visit 30    SLP Start Time 1100    SLP Stop Time  1145    SLP Time Calculation (min) 45 min    Activity Tolerance Patient tolerated treatment well              Past Medical History:  Diagnosis Date   Anxiety    COPD (chronic obstructive pulmonary disease) (HCC)    Depression    GERD (gastroesophageal reflux disease)    Glaucoma    History of colonic polyps    History of kidney stones    Hypercholesteremia    Hyperlipidemia    Hypertension    Neuropathy    Osteoarthritis of left knee    Sleep apnea    Stroke (HCC) 12/10/1996   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   TIA (transient ischemic attack)    Type 1 diabetes (HCC)    on insulin pump   Past Surgical History:  Procedure Laterality Date   CAROTID ARTERY ANGIOPLASTY Right 10/18/2004   CATARACT EXTRACTION, BILATERAL Bilateral    COLONOSCOPY     COLONOSCOPY WITH PROPOFOL N/A 08/25/2018   Procedure: COLONOSCOPY WITH PROPOFOL;  Surgeon: Scot Jun, MD;  Location: [facility] ENDOSCOPY;  Service: Endoscopy;  Laterality: N/A;   CYSTOSCOPY     ESOPHAGOGASTRODUODENOSCOPY     ESOPHAGOGASTRODUODENOSCOPY N/A 09/01/2022   Procedure: ESOPHAGOGASTRODUODENOSCOPY (EGD);  Surgeon: Toledo, Boykin Nearing, MD;  Location: [facility] ENDOSCOPY;  Service: Gastroenterology;  Laterality: N/A;   ESOPHAGOGASTRODUODENOSCOPY (EGD) WITH PROPOFOL N/A 08/25/2018    Procedure: ESOPHAGOGASTRODUODENOSCOPY (EGD) WITH PROPOFOL;  Surgeon: Scot Jun, MD;  Location: [facility] ENDOSCOPY;  Service: Endoscopy;  Laterality: N/A;   EYE SURGERY     PARTIAL KNEE ARTHROPLASTY Left 01/05/2022   Procedure: UNICOMPARTMENTAL KNEE;  Surgeon: Christena Flake, MD;  Location: [facility] ORS;  Service: Orthopedics;  Laterality: Left;   WISDOM TOOTH EXTRACTION     Patient Active Problem List   Diagnosis Date Noted   DKA (diabetic ketoacidosis) (HCC) 02/11/2023   Known medical problems 02/11/2023   Colitis 02/11/2023   SOB (shortness of breath) 04/09/2021   HLD (hyperlipidemia) 04/09/2021   HTN (hypertension) 04/09/2021   Stroke (HCC) 04/09/2021   Chest pain 04/09/2021   Depression with anxiety 04/09/2021   Anxiety disorder 09/23/2017   MDD (major depressive disorder), single episode, severe with psychotic features (HCC) 03/01/2017   Adjustment disorder with mixed anxiety and depressed mood 02/28/2017   Psychosis, affective (HCC) 02/28/2017   Diabetic retinopathy (HCC) 10/03/2012   GERD (gastroesophageal reflux disease) 04/04/2012   Diabetic neuropathy (HCC) 04/04/2012   DM (diabetes mellitus) type I, controlled, with peripheral vascular disorder (HCC) 02/04/2009   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 Current working diagnosis of Parkinson's Disease with concern for facial drooping, difficulty swallowing, hypophonia, and family history of Parkinson's disease. Pt  also experiences severe depression as well as complex motor tics d/t suspicion for longstanding tic disorder with component of tardive dyskinesia in setting of neuroleptics (Ability, risperidone).   DIAGNOSTIC FINDINGS:   Modified Barium Swallow Study 06/02/2018 "Mild pharyngeal dysphagia characterized by delayed pharyngeal swallow initiation, reduced pharyngeal pressure generation, mild pharyngeal residue, transient laryngeal penetration (nectar-thick and thin liquids), and aspiration X1." Of note, report documents "pt compliant of weak voice" recommend ENT follow up  MR Brain w wo contrast 07/05/2023 - No acute intracranial process. No evidence of acute or subacute  infarct.   Single fiber EMG with [facility] - scheduled 10/12/2023   PAIN:  Are you having pain? No  FALLS: Has patient fallen in last 6 months?  See PT evaluation for details  LIVING ENVIRONMENT: Lives with: lives with their spouse Lives in: House/apartment  PLOF:  Level of assistance: Independent with ADLs, Independent with IADLs Employment: On disability  PATIENT GOALS    to improve speech and swallow function  SUBJECTIVE STATEMENT: Pt reports low blood sugar at beginning of session, ST provided with snacks Pt accompanied by: self  OBJECTIVE:   TODAY'S TREATMENT:  Skilled treatment session focused on pt's dysphagia and dysarthria. SLP facilitated session by providing the following interventions:  Pt consumed POs during this session with no overt s/s of aspiration . For details see clinical impression statement.   CONVERSATION  Begin with structured tasks Every single word with INTENT- Supervision A Rare Min A  to achieve ~ 90% speech intelligibility at the unknown conversation level in a moderately noisy environment over 45 minutes   PATIENT EDUCATION: Education details: see above Person educated: Patient Education method: Explanation Education comprehension: needs further education   HOME EXERCISE  PROGRAM: Speak Out assignments Follow general aspiration precautions   GOALS: Goals reviewed with patient? Yes  SHORT TERM GOALS: Target date: 10 sessions  With Rare Min A, patient will demo HEP for motor speech accurately in 5/7 opportunities.  Baseline: Goal status: INITIAL: inconsistent completion: improved consistency   2.  To determine optimal resistance levels for Respiratory Muscle Training (RMT) for improving increase hyolaryngeal elevation and strengthen cough for airway clearance, patient will participate in evaluation (and re-assessment as needed) of maximum expiratory pressure (MEP). Baseline:  Goal status: INITIAL: deferred at this time  3.  With Min A, patient will complete 3 sets of 10 repetitions with EMST set at TBD cmH2O with self-reported effortful of 7 out of 10. Baseline:  Goal status: INITIAL: deferred at this time  4.  With Min A, patient will improve speech intelligibility for  phrase by controlling rate of speech, over-articulation, and increased loudness to achieve 75% intelligibility.  Baseline:  Goal status: INITIAL: progress made towards goals: MET   LONG TERM GOALS: Target date: 11/21/2023  Updated: 10/31/2023 With Supervision A, patient will participate in 15 minutes conversation, maintaining average loudness of 75 dB and loud, good quality voice.   Baseline:  Goal status: INITIAL: progress made: MET  2.  Patient will report improved communication effectiveness as measured by Communicative Effectiveness Survey. Baseline:  Goal status: INITIAL:  progress made: MET  3.  Patient will participate in objective swallowing evaluation (MBSS) to identify safest diet recommendation as well as therapeutic targets.  Baseline:  Goal status: INITIAL: deferred as s/s decreased when following general aspiration precautions   4.  Patient will improve perception of swallowing as indicated by an improvement in EAT-10 score by 12 weeks from initial swallowing  therapy session.  Baseline:  Goal status: INITIAL: pt reports improvement when following general aspiration precautions.: MET   ASSESSMENT:  CLINICAL IMPRESSION: Patient is a 68 year old male y.o.  who was seen today for a speech language treatment d/t Parkinson's Disease. 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    Pt consumed graham crackers with peanut butter and thin liquids without any overt s/s of aspiration. Pt is able to discuss general aspiration precautions and reports decreased swallowing issues when following. He is also at likely stable baseline for speech intelligibility with intermittent need for cues to increase his vocal intensity. He reports overall improvement with decreased need to repeat himself. At this time, would recommend discontinuing ST services until further diagnosis is made and potential need for additional services based on diagnosis is warranted. Pt is in agreement.    PLAN: Discharge pending neurological diagnosis.   Kem Hensen B. Dreama Saa, M.S., CCC-SLP, Tree surgeon Certified Brain Injury Specialist [facility]  [facility] Office [phone] Ascom [phone] Fax [phone]

## 2023-10-31 NOTE — Therapy (Unsigned)
 OUTPATIENT PHYSICAL THERAPY NEURO TREATMENT  Patient Name: Edgar Huynh MRN: [id number] DOB:08-22-1955, 68 y.o., male Today's Date: 10/31/2023   PCP: Edgar Corner, PA-C  REFERRING PROVIDER: Wilford Corner, PA-C   END OF SESSION:  PT End of Session - 10/31/23 1022     Visit Number 15    Number of Visits 24    Date for PT Re-Evaluation 11/23/23    PT Start Time 1022    PT Stop Time 1100    PT Time Calculation (min) 38 min    Equipment Utilized During Treatment Gait belt    Activity Tolerance Patient tolerated treatment well    Behavior During Therapy WFL for tasks assessed/performed              Past Medical History:  Diagnosis Date   Anxiety    COPD (chronic obstructive pulmonary disease) (HCC)    Depression    GERD (gastroesophageal reflux disease)    Glaucoma    History of colonic polyps    History of kidney stones    Hypercholesteremia    Hyperlipidemia    Hypertension    Neuropathy    Osteoarthritis of left knee    Sleep apnea    Stroke (HCC) 12/10/1996   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   Edgar (transient ischemic attack)    Type 1 diabetes (HCC)    on insulin pump   Past Surgical History:  Procedure Laterality Date   CAROTID ARTERY ANGIOPLASTY Right 10/18/2004   CATARACT EXTRACTION, BILATERAL Bilateral    COLONOSCOPY     COLONOSCOPY WITH PROPOFOL N/A 08/25/2018   Procedure: COLONOSCOPY WITH PROPOFOL;  Surgeon: Edgar Jun, MD;  Location: [facility] ENDOSCOPY;  Service: Endoscopy;  Laterality: N/A;   CYSTOSCOPY     ESOPHAGOGASTRODUODENOSCOPY     ESOPHAGOGASTRODUODENOSCOPY N/A 09/01/2022   Procedure: ESOPHAGOGASTRODUODENOSCOPY (EGD);  Surgeon: Edgar Huynh, Edgar Nearing, MD;  Location: [facility] ENDOSCOPY;  Service: Gastroenterology;  Laterality: N/A;   ESOPHAGOGASTRODUODENOSCOPY (EGD) WITH PROPOFOL N/A 08/25/2018   Procedure: ESOPHAGOGASTRODUODENOSCOPY (EGD) WITH PROPOFOL;  Surgeon: Edgar Jun, MD;  Location: [facility]  ENDOSCOPY;  Service: Endoscopy;  Laterality: N/A;   EYE SURGERY     PARTIAL KNEE ARTHROPLASTY Left 01/05/2022   Procedure: UNICOMPARTMENTAL KNEE;  Surgeon: Edgar Flake, MD;  Location: [facility] ORS;  Service: Orthopedics;  Laterality: Left;   WISDOM TOOTH EXTRACTION     Patient Active Problem List   Diagnosis Date Noted   DKA (diabetic ketoacidosis) (HCC) 02/11/2023   Known medical problems 02/11/2023   Colitis 02/11/2023   SOB (shortness of breath) 04/09/2021   HLD (hyperlipidemia) 04/09/2021   HTN (hypertension) 04/09/2021   Stroke (HCC) 04/09/2021   Chest pain 04/09/2021   Depression with anxiety 04/09/2021   Anxiety disorder 09/23/2017   MDD (major depressive disorder), single episode, severe with psychotic features (HCC) 03/01/2017   Adjustment disorder with mixed anxiety and depressed mood 02/28/2017   Psychosis, affective (HCC) 02/28/2017   Diabetic retinopathy (HCC) 10/03/2012   GERD (gastroesophageal reflux disease) 04/04/2012   Diabetic neuropathy (HCC) 04/04/2012   DM (diabetes mellitus) type I, controlled, with peripheral vascular disorder (HCC) 02/04/2009   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 Was seen by neurologist in [place] last week. Reports that new possible differential diagnosis is ALS. Will follow-up with primay neurologist following this PT treatment. Wants to possibly stop PT treatment based on  decision for moving treatment to ALS, compared to PD.   Pt accompanied by: self  PERTINENT HISTORY:   From Neurologist "Edgar Huynh mentioned that he is taking his medications as prescribed. 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   Concern for myasthenia gravis vs. parkinsonism in patient presenting with multiple neurological symptoms of facial drooping, difficulty swallowing, decrease speech volume, stiffness and short gait, shortness of breath and coughing. Patient states the symptoms started in 04/2023. Patient denise fluctuating droopy eyelids, starting new medications around the time of onset of symptoms. Patient does have a positive family history of Parkinson's disease in father. Negative for acute stroke, per 06/2023 MRI"   PAIN:  Are you having pain? No and occasional knee pain with falls >6 months ago   PRECAUTIONS: None  RED FLAGS: None   WEIGHT BEARING RESTRICTIONS: No  FALLS: Has patient fallen in last 6 months? No and several near falls  poor corrections of anterior LOB   LIVING ENVIRONMENT: Lives with: lives with their spouse Lives in: House/apartment Stairs: Yes: Internal: 13 steps; on left going up and External: 2 steps; on left going up Has following equipment at home: None  PLOF: Independent, Independent with basic ADLs, Independent with gait, and Independent with transfers  PATIENT GOALS: improve groggy drowsy feeling. Improve endurance and strength.   OBJECTIVE:  Note: Objective measures were completed at Evaluation unless otherwise noted.  DIAGNOSTIC FINDINGS:   MRI 11/19  IMPRESSION: Scattered and confluent T2 hyperintense signal in the periventricular white matter, likely the sequela of moderate chronic small vessel ischemic disease. Remote lacunar infarcts in the pons and left caudate. No acute intracranial process. No evidence of acute or subacute infarct.  COGNITION: Overall  cognitive status: Within functional limits for tasks assessed   SENSATION: WFL States increased sensitivity in BLE  COORDINATION: Dysdidodactic kinesia: WFL  Finger ot nose WFL  Ankle to knee: Mild decreased speed on the L and mild under shooting.    EDEMA:  WFL   MUSCLE TONE:  Grossly WFL  POSTURE: rounded shoulders and forward head  LOWER EXTREMITY ROM:     WFL  LOWER EXTREMITY MMT:    MMT Right Eval Left Eval  Hip flexion 4- 4-  Hip extension    Hip abduction 4- 4-  Hip adduction 4 4  Hip internal rotation    Hip external rotation    Knee flexion 4 4  Knee extension 4+ 4+  Ankle dorsiflexion 4+ 4+  Ankle plantarflexion    Ankle inversion    Ankle eversion    (Blank rows = not tested)  BED MOBILITY:  Sit to supine Complete Independence Supine to sit Complete Independence  TRANSFERS: Assistive device utilized: None  Sit to stand: Complete Independence Stand to sit: Complete Independence Chair to chair: Complete Independence Floor:  TBD  RAMP:  Level of Assistance: Complete Independence Assistive device utilized: None Ramp Comments:   CURB:  Level of Assistance: Complete Independence Assistive device utilized: None Curb Comments:   STAIRS: Level of Assistance: Complete Independence Stair Negotiation Technique: Alternating Pattern  with No Rails Number of Stairs: 4  Height of Stairs: 6  Comments: WFL  GAIT: Gait pattern:   ,  step through pattern, decreased arm swing- Right, decreased arm swing- Left, decreased trunk rotation, and trunk flexed Distance walked: 143ft Assistive device utilized: None Level of assistance: Complete Independence Comments:   FUNCTIONAL TESTS:  5 times sit to stand: 11.51 Timed up and go (TUG): 8.25 6 minute walk test: 623ft 10 meter walk test: 7.9 and 8.3  Berg Balance Scale: 49 Functional gait assessment: 25  PATIENT SURVEYS:  FOTO 53                                                                                                                               TREATMENT DATE: 09/14/2023  Nustep reciprocal movement training x 6 min level 1-4. Cues for consistent SPM >45 through varied resistance and improved trunk rotation.   PT instructed pt in goal assessment.  SLS assessment.  Performed x 2 bouts bil.  3/17: R: 10 sec. 15 sec on the LLE   6 Min Walk Test:  Instructed patient to ambulate as quickly and as safely as possible for 6 minutes using LRAD. Patient was allowed to take standing rest breaks without stopping the test, but if the patient required a sitting rest break the clock would be stopped and the test would be over.  Results: 1362ft  feet using no AD with supervision assist. Fatigue noted upon completion. Results indicate that the patient has reduced endurance with ambulation compared to age matched norms.  Age Matched Norms: 33-69 yo M: 11 F: 62, 93-79 yo M: 54 F: 471, 24-89 yo M: 417 F: 392 MDC: 58.21 meters (190.98 feet) or 50 meters (ANPTA Core Set of Outcome Measures for Adults with Neurologic Conditions, 2018)  HEP reviewed and expanded. See below for detail.   Throughout session, supervision assist provided by PT safety for standing activities, but no LOB noted throughout session.   PATIENT EDUCATION: Education details: POC, HEP Pt educated throughout session about proper posture and technique with exercises. Improved exercise technique, movement at target joints, use of target muscles after min to mod verbal, visual, tactile cues.  Person educated: Patient Education method: Explanation Education comprehension: verbalized understanding  HOME EXERCISE PROGRAM: Access Code: [email or web address] URL: [email or web address] Date: 10/31/2023 Prepared by: Grier Rocher  Exercises - Sit to Stand  - 1 x daily - 2-3 x weekly - 2 sets - 10 reps - Standing Tandem Balance with Counter Support  - 1 x daily - 2-3 x weekly - 4 sets - 10 reps - 15 hold - Single Leg Stance with  Support  - 1 x daily - 2-3 x weekly - 4 sets - 10 reps - 10 hold - Standing Hip Abduction with Counter Support  - 1 x daily - 2-3 x weekly - 2 sets - 10 reps - Standing March with Counter Support  - 1 x daily - 2-3 x weekly - 2 sets - 10 reps - Side Stepping with Counter Support  - 1 x daily - 7 x weekly -  3 sets - 10 reps - Tandem Walking with Counter Support  - 1 x daily - 7 x weekly - 3 sets - 10 reps - Backward Walking with Counter Support  - 1 x daily - 7 x weekly - 3 sets - 10 reps  GOALS: Goals reviewed with patient? Yes   SHORT TERM GOALS: Target date: 09/29/2023  Patient will be independent in home exercise program to improve strength/mobility for better functional independence with ADLs. Baseline: to be given at session 2.  Goal status: INITIAL   LONG TERM GOALS: Target date: 11/24/2023  Patient will increase FOTO score  by equal to or greater than  4 points  to demonstrate statistically significant improvement in mobility and quality of life.  Baseline: 53 10/03/23: 53 3/17: 77 Goal status: IN PROGRESS  2.  Patient (> 85 years old) will improve SLS by < 15 seconds indicating an increased LE strength and improved balance. Baseline: 4 sec bil 10/03/23: 10 sec R, 6 sec L 3/17: R: 10 sec. 15 sec on the LLE   Goal status: IN PROGRESS  3.  Patient will increase Berg Balance score by > 4 points to demonstrate decreased fall risk during functional activities Baseline: 49 10/03/23: 56 Goal status: MET  4.  Patient will increase 6 min walk test by 161ft  as to improve gait speed for better community ambulation and to reduce fall risk. Baseline: 663ft for 3:40min  10/03/23: 1115'  3/17: 1326ft  Goal status: MET  5.  Patient will increase FGA score to >27 as to demonstrate reduced fall risk and improved dynamic gait balance for better safety with community/home ambulation.  Baseline: 25 10/03/23: 28 Goal status: MET   ASSESSMENT:  CLINICAL IMPRESSION:  PT treatment  focused on Goal assessment and expansion of HEP. Pt demonstrating improved balance and activity tolerance with increased distance on 6 min walk test and SLS. Pt spoke with Neurology, and has new differential diagnosis on ALS. Pt reports that she may want to cease PT treatment depending and management of diagnosis. Will confirm need to continue PT following following with Dr Clelia Croft on 10/31/22. Pt will continue to benefit from skilled therapy to address remaining deficits in order to improve overall QoL and return to PLOF.     OBJECTIVE IMPAIRMENTS: Abnormal gait, cardiopulmonary status limiting activity, decreased activity tolerance, decreased balance, decreased cognition, decreased endurance, decreased mobility, increased fascial restrictions, and increased muscle spasms.   ACTIVITY LIMITATIONS: carrying, lifting, bending, stairs, transfers, and locomotion level  PARTICIPATION LIMITATIONS: community activity and yard work  PERSONAL FACTORS: Age, Fitness, and 1-2 comorbidities: CVA and PD vs myasthenia gravis   are also affecting patient's functional outcome.   REHAB POTENTIAL: Excellent  CLINICAL DECISION MAKING: Stable/uncomplicated  EVALUATION COMPLEXITY: Low  PLAN:  PT FREQUENCY: 1-2x/week  PT DURATION: 12 weeks  PLANNED INTERVENTIONS: 97110-Therapeutic exercises, 97530- Therapeutic activity, [id number]- Neuromuscular re-education, 97535- Self Care, 24401- Manual therapy, [phone]- Gait training, [phone]- Splinting, Patient/Family education, Balance training, Stair training, Taping, Joint mobilization, Joint manipulation, DME instructions, Wheelchair mobility training, Cryotherapy, and Moist heat  PLAN FOR NEXT SESSION:   Continued POC Improved activity tolerance and BLE strengthening  High level balance training.    Grier Rocher PT, DPT  Physical Therapist - [place]  [facility]  10:36 AM 10/31/23

## 2023-11-01 ENCOUNTER — Telehealth: Payer: Self-pay | Admitting: Physical Therapy

## 2023-11-01 NOTE — Telephone Encounter (Signed)
 Spoke with patient in regards to continuation of treatment plan, based on recent differential diagnosis of ALS. Pt is requesting to cease PT for the foreseeable future. While medical management of ALS treatment plan in initiated   Grier Rocher PT, DPT  Physical Therapist - [facility]  [facility]  4:14 PM 11/01/23

## 2023-11-02 ENCOUNTER — Ambulatory Visit: Payer: Medicare Other | Admitting: Speech Pathology

## 2023-11-02 ENCOUNTER — Ambulatory Visit: Payer: Medicare Other | Admitting: Physical Therapy

## 2023-11-07 ENCOUNTER — Encounter: Payer: Medicare Other | Admitting: Speech Pathology

## 2023-11-07 ENCOUNTER — Ambulatory Visit: Payer: Medicare Other | Admitting: Physical Therapy

## 2023-11-09 ENCOUNTER — Ambulatory Visit: Payer: Medicare Other | Admitting: Physical Therapy

## 2023-11-09 ENCOUNTER — Ambulatory Visit: Payer: Medicare Other | Admitting: Speech Pathology

## 2023-11-14 ENCOUNTER — Ambulatory Visit: Payer: Medicare Other | Admitting: Speech Pathology

## 2023-11-14 ENCOUNTER — Ambulatory Visit: Payer: Medicare Other | Admitting: Physical Therapy

## 2023-11-16 ENCOUNTER — Ambulatory Visit: Payer: Medicare Other | Admitting: Speech Pathology

## 2023-11-16 ENCOUNTER — Ambulatory Visit: Payer: Medicare Other | Admitting: Physical Therapy

## 2023-11-21 ENCOUNTER — Ambulatory Visit: Payer: Medicare Other | Admitting: Physical Therapy

## 2023-11-21 ENCOUNTER — Ambulatory Visit: Payer: Medicare Other | Admitting: Speech Pathology

## 2023-11-23 ENCOUNTER — Ambulatory Visit: Payer: Medicare Other | Admitting: Speech Pathology

## 2023-11-23 ENCOUNTER — Ambulatory Visit: Payer: Medicare Other | Admitting: Physical Therapy

## 2023-11-25 ENCOUNTER — Other Ambulatory Visit: Payer: Self-pay | Admitting: Psychiatry

## 2023-11-25 ENCOUNTER — Telehealth: Payer: Self-pay

## 2023-11-25 MED ORDER — ESCITALOPRAM OXALATE 20 MG PO TABS
20.0000 mg | ORAL_TABLET | Freq: Every day | ORAL | 0 refills | Status: DC
Start: 2023-11-25 — End: 2023-12-07

## 2023-11-25 NOTE — Telephone Encounter (Signed)
 Please verify with the pharmacy and notify the patient. He should have enough trazodone refills remaining.

## 2023-11-25 NOTE — Telephone Encounter (Signed)
 pharmacy states that pt needs refills on the buspar, clonazpam and lexapro

## 2023-11-25 NOTE — Telephone Encounter (Signed)
 Lexapro has been ordered. He has enough Buspar to last until his next visit, and Clonazepam has been filled by another provider. Please notify the patient. Thanks.

## 2023-11-28 ENCOUNTER — Ambulatory Visit: Payer: Medicare Other | Admitting: Physical Therapy

## 2023-11-28 ENCOUNTER — Ambulatory Visit: Payer: Medicare Other | Admitting: Speech Pathology

## 2023-11-28 NOTE — Telephone Encounter (Signed)
 Pt.notified

## 2023-11-29 ENCOUNTER — Ambulatory Visit (INDEPENDENT_AMBULATORY_CARE_PROVIDER_SITE_OTHER): Admitting: Dermatology

## 2023-11-29 ENCOUNTER — Encounter: Payer: Self-pay | Admitting: Dermatology

## 2023-11-29 DIAGNOSIS — Z1283 Encounter for screening for malignant neoplasm of skin: Secondary | ICD-10-CM

## 2023-11-29 DIAGNOSIS — L719 Rosacea, unspecified: Secondary | ICD-10-CM

## 2023-11-29 DIAGNOSIS — L219 Seborrheic dermatitis, unspecified: Secondary | ICD-10-CM

## 2023-11-29 DIAGNOSIS — W908XXA Exposure to other nonionizing radiation, initial encounter: Secondary | ICD-10-CM

## 2023-11-29 DIAGNOSIS — D229 Melanocytic nevi, unspecified: Secondary | ICD-10-CM

## 2023-11-29 DIAGNOSIS — L578 Other skin changes due to chronic exposure to nonionizing radiation: Secondary | ICD-10-CM

## 2023-11-29 DIAGNOSIS — Z79899 Other long term (current) drug therapy: Secondary | ICD-10-CM

## 2023-11-29 DIAGNOSIS — L820 Inflamed seborrheic keratosis: Secondary | ICD-10-CM | POA: Diagnosis not present

## 2023-11-29 DIAGNOSIS — L821 Other seborrheic keratosis: Secondary | ICD-10-CM

## 2023-11-29 DIAGNOSIS — L814 Other melanin hyperpigmentation: Secondary | ICD-10-CM

## 2023-11-29 DIAGNOSIS — D1801 Hemangioma of skin and subcutaneous tissue: Secondary | ICD-10-CM

## 2023-11-29 DIAGNOSIS — Z7189 Other specified counseling: Secondary | ICD-10-CM

## 2023-11-29 MED ORDER — HYDROCORTISONE 2.5 % EX CREA: 1.0000 | TOPICAL_CREAM | CUTANEOUS | 11 refills | Status: AC

## 2023-11-29 MED ORDER — KETOCONAZOLE 2 % EX CREA: 1.0000 | TOPICAL_CREAM | CUTANEOUS | 11 refills | Status: AC

## 2023-11-29 NOTE — Patient Instructions (Signed)

## 2023-11-29 NOTE — Progress Notes (Signed)
 Follow-Up Visit   Subjective  Edgar Huynh is a 68 y.o. male who presents for the following: Skin Cancer Screening and Full Body Skin Exam, recheck his face treating with Hydrocortisone cream and Ketoconazole cream with a good response  The patient presents for Total-Body Skin Exam (TBSE) for skin cancer screening and mole check. The patient has spots, moles and lesions to be evaluated, some may be new or changing and the patient may have concern these could be cancer.  Wife is with patient and contributes to history.   The following portions of the chart were reviewed this encounter and updated as appropriate: medications, allergies, medical history  Review of Systems:  No other skin or systemic complaints except as noted in HPI or Assessment and Plan.  Objective  Well appearing patient in no apparent distress; mood and affect are within normal limits.  A full examination was performed including scalp, head, eyes, ears, nose, lips, neck, chest, axillae, abdomen, back, buttocks, bilateral upper extremities, bilateral lower extremities, hands, feet, fingers, toes, fingernails, and toenails. All findings within normal limits unless otherwise noted below.   Relevant physical exam findings are noted in the Assessment and Plan.  right superior pectoral x 1, left posterior axillary x 1, back x 18 (20) 0.5 cm brown papule- right superior pectoral  Brown waxy papule- left posterior axillary  Stuck-on, waxy, tan-brown papules- back -- Discussed benign etiology and prognosis.   Assessment & Plan   SKIN CANCER SCREENING PERFORMED TODAY.  ACTINIC DAMAGE - Chronic condition, secondary to cumulative UV/sun exposure - diffuse scaly erythematous macules with underlying dyspigmentation - Recommend daily broad spectrum sunscreen SPF 30+ to sun-exposed areas, reapply every 2 hours as needed.  - Staying in the shade or wearing long sleeves, sun glasses (UVA+UVB protection) and wide brim hats (4-inch  brim around the entire circumference of the hat) are also recommended for sun protection.  - Call for new or changing lesions.  LENTIGINES, SEBORRHEIC KERATOSES, HEMANGIOMAS - Benign normal skin lesions - Benign-appearing - Call for any changes  MELANOCYTIC NEVI - Tan-brown and/or pink-flesh-colored symmetric macules and papules - Benign appearing on exam today - Observation - Call clinic for new or changing moles - Recommend daily use of broad spectrum spf 30+ sunscreen to sun-exposed areas.   ROSACEA Exam Mid face erythema  Rosacea is a chronic progressive skin condition usually affecting the face of adults, causing redness and/or acne bumps. It is treatable but not curable. It sometimes affects the eyes (ocular rosacea) as well. It may respond to topical and/or systemic medication and can flare with stress, sun exposure, alcohol, exercise, topical steroids (including hydrocortisone/cortisone 10) and some foods.  Daily application of broad spectrum spf 30+ sunscreen to face is recommended to reduce flares. Treatment Plan No treatment needed   INFLAMED SEBORRHEIC KERATOSIS (20) right superior pectoral x 1, left posterior axillary x 1, back x 18 (20) Symptomatic, irritating, patient would like treated.   Recheck area treated on the right superior pectoral at the next office visit  Destruction of lesion - right superior pectoral x 1, left posterior axillary x 1, back x 18 (20) Complexity: simple   Destruction method: cryotherapy   Informed consent: discussed and consent obtained   Timeout:  patient name, date of birth, surgical site, and procedure verified Lesion destroyed using liquid nitrogen: Yes   Region frozen until ice ball extended beyond lesion: Yes   Outcome: patient tolerated procedure well with no complications   Post-procedure details: wound  care instructions given   SEBORRHEIC DERMATITIS  Seborrheic dermatitis face Seborrheic Dermatitis  -  is a chronic persistent  rash characterized by pinkness and scaling most commonly of the mid face but also can occur on the scalp (dandruff), ears; mid chest, mid back and groin.  It tends to be exacerbated by stress and cooler weather.  People who have neurologic disease may experience new onset or exacerbation of existing seborrheic dermatitis.  The condition is not curable but treatable and can be controlled.   Chronic and persistent condition with duration or expected duration over one year. Condition is improving with treatment but not currently at goal.   Continue Ketoconazole 2% crean - apply topically to affected areas of face at bedtime on T- Thur - Sat weekly.  Continue Hydrocrotisone 2.5 % Cream - apply topically to affected areas of face at bedtime on M - W - Thur weekly Related Medications hydrocortisone 2.5 % cream Apply 1 Application topically See admin instructions. Apply on Monday Wednesday and Friday to affected areas of face ketoconazole (NIZORAL) 2 % cream Apply 1 Application topically See admin instructions. Apply to face Tues Thurs Sat   Return in about 1 year (around 11/28/2024) for TBSE, ISK .  I, Clara Crisp, CMA, am acting as scribe for Celine Collard, MD .   Documentation: I have reviewed the above documentation for accuracy and completeness, and I agree with the above.  Celine Collard, MD

## 2023-11-30 ENCOUNTER — Ambulatory Visit: Payer: Medicare Other | Admitting: Physical Therapy

## 2023-11-30 ENCOUNTER — Ambulatory Visit: Payer: Medicare Other | Admitting: Speech Pathology

## 2023-12-01 ENCOUNTER — Other Ambulatory Visit
Admission: RE | Admit: 2023-12-01 | Discharge: 2023-12-01 | Disposition: A | Discharge status: Home / Self Care | Attending: Family Medicine | Admitting: Family Medicine

## 2023-12-01 DIAGNOSIS — R0789 Other chest pain: Secondary | ICD-10-CM | POA: Diagnosis present

## 2023-12-01 LAB — TROPONIN I (HIGH SENSITIVITY): Troponin I (High Sensitivity): 4 ng/L (ref <18)

## 2023-12-01 NOTE — Progress Notes (Addendum)
 Virtual Visit via Video Note  I connected with Edgar Huynh on 12/10/23 at  3:00 PM EDT by a video enabled telemedicine application and verified that I am speaking with the correct person using two identifiers.  Location: Patient: home Provider: home office Persons participated in the visit- patient, provider    I discussed the limitations of evaluation and management by telemedicine and the availability of in person appointments. The patient expressed understanding and agreed to proceed.    I discussed the assessment and treatment plan with the patient. The patient was provided an opportunity to ask questions and all were answered. The patient agreed with the plan and demonstrated an understanding of the instructions.   The patient was advised to call back or seek an in-person evaluation if the symptoms worsen or if the condition fails to improve as anticipated.   Todd Fossa, MD    [facility] MD/PA/NP OP Progress Note  12/07/2023 3:34 PM Edgar Huynh  MRN:  [id number]  Chief Complaint:  Chief Complaint  Patient presents with   Follow-up   HPI:  According to the chart review, he was seen by Dr. Ethelle Herb, Tandy Fam., MD  for slurred speech. "He is 68 y.o. male with a complicated PMH including many baseline neurological problems. He is referred for "bulbar ALS." I do not find that his history or exam are suggestive of this. To me, his dysarthria is fluctuating, breathy and of low voice volume (as seen in PD) rather than clearly progressive, flaccid or spastic (as seen in bulbar ALS). He has no PBA (as usually seen in bulbar ALS). He does not have clear lower motor neuron signs by my exam anywhere, including his tongue. He does have patchy upper motor neuron signs as documented above, but has a history of TBI, strokes and small vessel disease that may explain these. His EMG shows only the most minimal chronic reinnervation, no denervation. This has a broad differential and  certainly does not confirm ALS.  On my history and exam, he has several features of Parkinson's disease. Given his family history of PD, this might be genetic. I suggested he go back to Movement disorders and get a DAT scan as well as genetic testing for PD, and possibly a trial of PD treatment.  If the DAT scan, genetic testing and treatment trial are all unexpected negative, and his symptoms or signs become more obvious for ALS, I can certainly see this very nice man and his wife back again."  He had a visit with other neurology as below. Jacumin, Erika Leigh, PA - 11/30/2023 10:00 AM EDT  Formatting of this note might be different from the original. 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 I suspect this is now apparent as you stopped the Sinemet and you are taking the Ingrezza. Ingrezza blocks dopamine and can cause symptoms that look like Parkinson's disease. I would not recommend a DATscan at this time as it would not change management I would recommend lowering your Ingrezza to 60mg  for a month and if no improvement in your Parkinsonism but no worsening of your tardive dyskinesia then I would recommend lowering it to 40mg . "  This is a follow-up appointment for depression and insomnia.  He states that he will be getting colonoscopy.  He was seen by neurologist.  He was feeling down and had intense anxiety when he thought he had ALS.  However, they did not believe he has ALS.  They do not believe he  has MG.  He will be taking lower dose of Ingrezza as they thought it is medication-induced.  He feels that everything is taking too long.  He has been doing good otherwise.  His daughter visited him and his wife, and helped for cleaning.  He feels fatigued.  He is aware of his weight loss.  He has been communicating with his provider, who advised him to lower the dose of Wegovy.  He denies SI.  He sleeps better since being on trazodone .  He had a fall.  He is not seeing a physical  therapist/ST as his insurance would cover limited numbers of visit.  He feels comfortable to stay on his current medication.   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  He states that although they were not rich, his parents provided enough to him.    Substance use   Tobacco Alcohol Other substances/  Current denies denies denies  Past denies 12 pack on weekend, not since 2018 denies  Past Treatment           Visit Diagnosis:    ICD-10-CM   1. MDD (major depressive disorder), recurrent, in full remission (HCC)  F33.42     2. Panic disorder  F41.0     3. Insomnia, unspecified type  G47.00       Past Psychiatric History: Please see initial evaluation for full details. I have reviewed the history. No updates at this time.     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   TIA (transient ischemic attack)    Type 1 diabetes (HCC)    on insulin  pump    Past Surgical History:  Procedure Laterality Date   CAROTID ARTERY ANGIOPLASTY Right 10/18/2004   CATARACT EXTRACTION, BILATERAL Bilateral    COLONOSCOPY     COLONOSCOPY WITH PROPOFOL  N/A 08/25/2018   Procedure: COLONOSCOPY WITH  PROPOFOL ;  Surgeon: Cassie Click, MD;  Location: [facility] ENDOSCOPY;  Service: Endoscopy;  Laterality: N/A;   CYSTOSCOPY     ESOPHAGOGASTRODUODENOSCOPY     ESOPHAGOGASTRODUODENOSCOPY N/A 09/01/2022   Procedure: ESOPHAGOGASTRODUODENOSCOPY (EGD);  Surgeon: Toledo, Alphonsus Jeans, MD;  Location: [facility] ENDOSCOPY;  Service: Gastroenterology;  Laterality: N/A;   ESOPHAGOGASTRODUODENOSCOPY (EGD) WITH PROPOFOL  N/A 08/25/2018   Procedure: ESOPHAGOGASTRODUODENOSCOPY (EGD) WITH PROPOFOL ;  Surgeon: Cassie Click, MD;  Location: [facility] ENDOSCOPY;  Service: Endoscopy;  Laterality: N/A;   EYE SURGERY     PARTIAL KNEE ARTHROPLASTY Left 01/05/2022   Procedure: UNICOMPARTMENTAL KNEE;  Surgeon: Elner Hahn, MD;  Location: [facility] ORS;  Service: Orthopedics;  Laterality: Left;   WISDOM TOOTH EXTRACTION  Family Psychiatric History: Please see initial evaluation for full details. I have reviewed the history. No updates at this time.     Family History:  Family History  Problem Relation Age of Onset   Parkinson's disease Father    Diabetes Mellitus II Brother     Social History:  Social History   Socioeconomic History   Marital status: Married    Spouse name: Leary Provencal   Number of children: 2   Years of education: Not on file   Highest education level: Some college, no degree  Occupational History   Not on file  Tobacco Use   Smoking status: Never   Smokeless tobacco: Never  Vaping Use   Vaping status: Never Used  Substance and Sexual Activity   Alcohol use: Not Currently    Comment: occasional   Drug use: Never   Sexual activity: Not on file    Comment: not asked if sexually active  Other Topics Concern   Not on file  Social History Narrative   Not on file   Social Drivers of Health   Financial Resource Strain: Medium Risk (09/12/2023)   [facility]   Overall Financial Resource Strain (CARDIA)    Difficulty of Paying Living Expenses: Somewhat hard  Food  Insecurity: No Food Insecurity (09/12/2023)   Received from [facility]   Hunger Vital Sign    Worried About Running Out of Food in the Last Year: Never true    Ran Out of Food in the Last Year: Never true  Transportation Needs: No Transportation Needs (09/12/2023)   Received from [facility] - Transportation    In the past 12 months, has lack of transportation kept you from medical appointments or from getting medications?: No    Lack of Transportation (Non-Medical): No  Physical Activity: Not on file  Stress: Not on file  Social Connections: Not on file    Allergies:  Allergies  Allergen Reactions   Other Rash    Clonidine 0.3 mg patch (adhesive caused the irritation)    Metabolic Disorder Labs: Lab Results  Component Value Date   HGBA1C 7.2 (H) 04/09/2021   MPG 159.94 04/09/2021   MPG 151 03/02/2017   Lab Results  Component Value Date   PROLACTIN 25.6 (H) 03/02/2017   Lab Results  Component Value Date   CHOL 151 04/10/2021   TRIG 134 04/10/2021   HDL 46 04/10/2021   CHOLHDL 3.3 04/10/2021   VLDL 27 04/10/2021   LDLCALC 78 04/10/2021   LDLCALC 119 (H) 03/02/2017   Lab Results  Component Value Date   TSH 1.582 11/09/2022   TSH 0.937 04/10/2021    Therapeutic Level Labs: No results found for: "LITHIUM" No results found for: "VALPROATE" No results found for: "CBMZ"  Current Medications: Current Outpatient Medications  Medication Sig Dispense Refill   albuterol  (VENTOLIN  HFA) 108 (90 Base) MCG/ACT inhaler Inhale 2 puffs into the lungs every 8 (eight) hours as needed.     Alpha-Lipoic Acid 200 MG CAPS Take 200 mg by mouth daily.     amLODipine  (NORVASC ) 5 MG tablet Take 5 mg by mouth every evening.     atorvastatin  (LIPITOR) 20 MG tablet Take 20 mg by mouth every evening.      budesonide-formoterol (SYMBICORT) 80-4.5 MCG/ACT inhaler Inhale 2 puffs into the lungs in the morning and at bedtime.     busPIRone  (BUSPAR )  30 MG tablet Take 1 tablet (30 mg  total) by mouth 2 (two) times daily. 60 tablet 3   CALCIUM  PO Take 1,200 mg by mouth daily.     CLINPRO 5000 1.1 % PSTE SMARTSIG:Sparingly To Teeth Daily     clonazePAM  (KLONOPIN ) 0.5 MG tablet Take 0.5 mg by mouth 2 (two) times daily as needed for anxiety.     clopidogrel  (PLAVIX ) 75 MG tablet Take 75 mg by mouth daily.     Coenzyme Q10 (COQ-10) 100 MG CAPS Take 100 mg by mouth daily.     escitalopram  (LEXAPRO ) 20 MG tablet Take 1 tablet (20 mg total) by mouth daily. 30 tablet 0   furosemide  (LASIX ) 20 MG tablet Take 20 mg by mouth daily.     gabapentin  (NEURONTIN ) 100 MG capsule Take 300 mg by mouth 3 (three) times daily.     glucose blood (CONTOUR NEXT TEST) test strip TEST BLOOD SUGAR 7 TIMES A DAY AS DIRECTED     HUMALOG 100 UNIT/ML injection Via Insulin  Pump     hydrocortisone  2.5 % cream Apply 1 Application topically See admin instructions. Apply on Monday Wednesday and Friday to affected areas of face 30 g 11   ketoconazole  (NIZORAL ) 2 % cream Apply 1 Application topically See admin instructions. Apply to face Tues Thurs Sat 30 g 11   latanoprost  (XALATAN ) 0.005 % ophthalmic solution Place 1 drop into both eyes at bedtime.     Multiple Vitamin (MULTIVITAMIN WITH MINERALS) TABS tablet Take 1 tablet by mouth daily.     pantoprazole  (PROTONIX ) 40 MG tablet Take 40 mg by mouth daily.     Potassium 99 MG TABS Take 2 tablets by mouth daily.     traZODone  (DESYREL ) 100 MG tablet Take 1 tablet (100 mg total) by mouth at bedtime. 30 tablet 3   valbenazine (INGREZZA) 80 MG capsule Take 80 mg by mouth daily.     No current facility-administered medications for this visit.     Musculoskeletal: Strength & Muscle Tone:  N/A Gait & Station:  N/A Patient leans: N/A  Psychiatric Specialty Exam: Review of Systems  Psychiatric/Behavioral:  Negative for agitation, behavioral problems, confusion, decreased concentration, dysphoric mood, hallucinations, self-injury,  sleep disturbance and suicidal ideas. The patient is nervous/anxious. The patient is not hyperactive.   All other systems reviewed and are negative.   There were no vitals taken for this visit.There is no height or weight on file to calculate BMI.  General Appearance: Well Groomed  Eye Contact:  Good  Speech:   muffled  Volume:  Normal  Mood:   good  Affect:  Appropriate, Congruent, and masked face  Thought Process:  Coherent  Orientation:  Full (Time, Place, and Person)  Thought Content: Logical   Suicidal Thoughts:  No  Homicidal Thoughts:  No  Memory:  Immediate;   Good  Judgement:  Good  Insight:  Good  Psychomotor Activity:  Normal  Concentration:  Concentration: Good and Attention Span: Good  Recall:  Good  Fund of Knowledge: Good  Language: Good  Akathisia:  No  Handed:  Right  AIMS (if indicated): not done  Assets:  Communication Skills Desire for Improvement  ADL's:  Intact  Cognition: WNL  Sleep:  Good   Screenings: AIMS    Flowsheet Row [facility]  AIMS Total Score 0      AUDIT    Flowsheet Row [facility]  Alcohol Use Disorder Identification  Test Final Score (AUDIT) 0      GAD-7    Flowsheet Row Office Visit from 11/09/2022 in [facility]  Total GAD-7 Score 9      PHQ2-9    Flowsheet Row Office Visit from 11/09/2022 in [facility] Nutrition from 08/26/2021 in [facility] at [facility] Total Score 2 0  PHQ-9 Total Score 6 --      Flowsheet Row ED from 10/21/2023 in [facility] at [facility] ED to Hosp-Admission (Discharged) from 02[facility]11/2023 in [facility] Admission (Discharged[facility] from 09/01/2022 in [facility]  C-SSRS RISK CATEGORY No Risk No Risk No Risk        Assessment and Plan:  Edgar Huynh is a 68 y.o. year old male with a history of depression, anxiety, Complex motor tics, type I diabetes, history of right pontine stroke,  TBI, hypertension, hyperlipidemia,   OSA on CPAP, OA, eosinophilic esophagitis, who presents for follow up appointment for below.   1. MDD (major depressive disorder), recurrent, in full remission (HCC) 2. Panic disorder Acute stressors include: loss of his mother, recent admission for DKA Other stressors include: Takes care of his wife, who uses a walker. Retirement (he describes satisfaction in this)    History: anxiety for many years. 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   He denies any current depressive symptoms of panic attacks except the time he thought he has ALS.  He now feels reassured by the recent visit with neurologist.  Will continue Lexapro  to target depression and anxiety.  Will continue BuSpar  for anxiety given he reports significant benefit.   3. Insomnia, unspecified type - on CPAP machine   He reports significant benefit from trazodone .  Will continue current dose to target insomnia .    # r/o polypharmacy  He is on gabapentin , clonazepam , and trazodone , and all of these medication can contribute to drowsiness during the day.  He was advised again to discuss with his primary care to hopefully taper down clonazepam .     # benzodiazepine dependence - on clonazepam  0.5 mg BID since 20's Unchanged. The medication is prescribed by another provider. He is advised this medication to be tapered off in the future to avoid long-term risks.  # weight loss He has had significant weight loss, which he attributed to [facility].  He reportedly contacted his provider, and the dose was reduced.  Will continue to monitor and then assess as needed.   # dysarthria He has several physical symptoms  including dysarthria, some movement issues, and weight loss.  He is followed by a neurologist, and Ingrezza was tapered down due to the thought that it is medication induced parkinsonism.  Will continue to monitor and assess as needed.   Plan Continue lexapro  20 mg daily  Continue buspirone  30 mg twice a day  Continue Trazodone  25-50 mg at night as needed for insomnia Next appointment: 6/11 at 3 30, video - on gabapentin  300 mg TID for tic, ingrezza 60 mg daily for slurred speech - on clonazepam  0.5 mg twice a day - on GOLO, OTC   The patient demonstrates the following risk factors for suicide: Chronic risk factors for suicide include: psychiatric disorder of depression, anxiety . Acute risk factors for suicide include: loss (financial, interpersonal, professional). Protective factors for this patient include: coping skills and hope  for the future. Considering these factors, the overall suicide risk at this point appears to be low. Patient is appropriate for outpatient follow up.   Collaboration of Care: Collaboration of Care: Other reviewed notes in Epic  Patient/Guardian was advised Release of Information must be obtained prior to any record release in order to collaborate their care with an outside provider. Patient/Guardian was advised if they have not already done so to contact the registration department to sign all necessary forms in order for us  to release information regarding their care.   Consent: Patient/Guardian gives verbal consent for treatment and assignment of benefits for services provided during this visit. Patient/Guardian expressed understanding and agreed to proceed.    Todd Fossa, MD 12/07/2023, 3:34 PM

## 2023-12-05 ENCOUNTER — Ambulatory Visit: Payer: Medicare Other | Admitting: Speech Pathology

## 2023-12-05 ENCOUNTER — Ambulatory Visit: Payer: Medicare Other | Admitting: Physical Therapy

## 2023-12-07 ENCOUNTER — Telehealth (INDEPENDENT_AMBULATORY_CARE_PROVIDER_SITE_OTHER): Payer: Self-pay | Admitting: Psychiatry

## 2023-12-07 ENCOUNTER — Ambulatory Visit: Payer: Medicare Other | Admitting: Physical Therapy

## 2023-12-07 ENCOUNTER — Ambulatory Visit: Payer: Medicare Other | Admitting: Speech Pathology

## 2023-12-07 ENCOUNTER — Encounter: Payer: Self-pay | Admitting: Psychiatry

## 2023-12-07 DIAGNOSIS — F3342 Major depressive disorder, recurrent, in full remission: Secondary | ICD-10-CM

## 2023-12-07 DIAGNOSIS — F410 Panic disorder [episodic paroxysmal anxiety] without agoraphobia: Secondary | ICD-10-CM

## 2023-12-07 DIAGNOSIS — G4700 Insomnia, unspecified: Secondary | ICD-10-CM | POA: Diagnosis not present

## 2023-12-07 MED ORDER — ESCITALOPRAM OXALATE 20 MG PO TABS: 20.0000 mg | ORAL_TABLET | Freq: Every day | ORAL | 3 refills | Status: DC

## 2023-12-07 MED ORDER — BUSPIRONE HCL 30 MG PO TABS: 30.0000 mg | ORAL_TABLET | Freq: Two times a day (BID) | ORAL | 3 refills | Status: DC

## 2023-12-07 NOTE — Patient Instructions (Signed)
 Continue lexapro  20 mg daily  Continue buspirone  30 mg twice a day  Continue Trazodone  25-50 mg at night as needed for insomnia Next appointment: 6/11 at 3 30

## 2023-12-09 ENCOUNTER — Other Ambulatory Visit: Payer: Self-pay | Admitting: Cardiology

## 2023-12-09 DIAGNOSIS — R0602 Shortness of breath: Secondary | ICD-10-CM

## 2023-12-09 DIAGNOSIS — E785 Hyperlipidemia, unspecified: Secondary | ICD-10-CM

## 2023-12-09 DIAGNOSIS — I10 Essential (primary) hypertension: Secondary | ICD-10-CM

## 2023-12-09 DIAGNOSIS — R0609 Other forms of dyspnea: Secondary | ICD-10-CM

## 2023-12-09 DIAGNOSIS — E1042 Type 1 diabetes mellitus with diabetic polyneuropathy: Secondary | ICD-10-CM

## 2023-12-12 ENCOUNTER — Ambulatory Visit: Payer: Medicare Other | Admitting: Speech Pathology

## 2023-12-12 ENCOUNTER — Ambulatory Visit: Payer: Medicare Other | Admitting: Physical Therapy

## 2023-12-14 ENCOUNTER — Ambulatory Visit: Payer: Medicare Other | Admitting: Physical Therapy

## 2023-12-14 ENCOUNTER — Other Ambulatory Visit: Payer: Self-pay

## 2023-12-14 ENCOUNTER — Emergency Department
Admission: EM | Admit: 2023-12-14 | Discharge: 2023-12-14 | Disposition: A | Discharge status: Home / Self Care | Attending: Emergency Medicine | Admitting: Emergency Medicine

## 2023-12-14 ENCOUNTER — Ambulatory Visit: Payer: Medicare Other | Admitting: Speech Pathology

## 2023-12-14 ENCOUNTER — Emergency Department

## 2023-12-14 DIAGNOSIS — E119 Type 2 diabetes mellitus without complications: Secondary | ICD-10-CM | POA: Diagnosis not present

## 2023-12-14 DIAGNOSIS — I10 Essential (primary) hypertension: Secondary | ICD-10-CM | POA: Insufficient documentation

## 2023-12-14 DIAGNOSIS — R42 Dizziness and giddiness: Secondary | ICD-10-CM | POA: Diagnosis not present

## 2023-12-14 DIAGNOSIS — R079 Chest pain, unspecified: Secondary | ICD-10-CM | POA: Insufficient documentation

## 2023-12-14 DIAGNOSIS — J449 Chronic obstructive pulmonary disease, unspecified: Secondary | ICD-10-CM | POA: Insufficient documentation

## 2023-12-14 DIAGNOSIS — R0602 Shortness of breath: Secondary | ICD-10-CM | POA: Diagnosis present

## 2023-12-14 DIAGNOSIS — Z7902 Long term (current) use of antithrombotics/antiplatelets: Secondary | ICD-10-CM | POA: Diagnosis not present

## 2023-12-14 LAB — URINALYSIS, W/ REFLEX TO CULTURE (INFECTION SUSPECTED)
Bacteria, UA: NONE SEEN
Bilirubin Urine: NEGATIVE
Glucose, UA: NEGATIVE mg/dL
Hgb urine dipstick: NEGATIVE
Ketones, ur: 20 mg/dL — AB
Leukocytes,Ua: NEGATIVE
Nitrite: NEGATIVE
Protein, ur: NEGATIVE mg/dL
Specific Gravity, Urine: 1.017 (ref 1.005–1.030)
Squamous Epithelial / HPF: 0 /HPF (ref 0–5)
pH: 5 (ref 5.0–8.0)

## 2023-12-14 LAB — BASIC METABOLIC PANEL WITH GFR
Anion gap: 12 (ref 5–15)
BUN: 22 mg/dL (ref 8–23)
CO2: 25 mmol/L (ref 22–32)
Calcium: 9.6 mg/dL (ref 8.9–10.3)
Chloride: 102 mmol/L (ref 98–111)
Creatinine, Ser: 0.93 mg/dL (ref 0.61–1.24)
GFR, Estimated: >60 mL/min (ref >60)
Glucose, Bld: 160 mg/dL — ABNORMAL HIGH (ref 70–99)
Potassium: 4 mmol/L (ref 3.5–5.1)
Sodium: 139 mmol/L (ref 135–145)

## 2023-12-14 LAB — CBC
HCT: 49.8 % (ref 39.0–52.0)
Hemoglobin: 16.8 g/dL (ref 13.0–17.0)
MCH: 30.8 pg (ref 26.0–34.0)
MCHC: 33.7 g/dL (ref 30.0–36.0)
MCV: 91.4 fL (ref 80.0–100.0)
Platelets: 265 10*3/uL (ref 150–400)
RBC: 5.45 MIL/uL (ref 4.22–5.81)
RDW: 13.1 % (ref 11.5–15.5)
WBC: 8.2 10*3/uL (ref 4.0–10.5)
nRBC: 0 % (ref 0.0–0.2)

## 2023-12-14 LAB — BRAIN NATRIURETIC PEPTIDE: B Natriuretic Peptide: 7.8 pg/mL (ref 0.0–100.0)

## 2023-12-14 LAB — TROPONIN I (HIGH SENSITIVITY)
Troponin I (High Sensitivity): 3 ng/L (ref <18)
Troponin I (High Sensitivity): 3 ng/L (ref <18)

## 2023-12-14 LAB — RESP PANEL BY RT-PCR (RSV, FLU A&B, COVID)  RVPGX2
Influenza A by PCR: NEGATIVE
Influenza B by PCR: NEGATIVE
Resp Syncytial Virus by PCR: NEGATIVE
SARS Coronavirus 2 by RT PCR: NEGATIVE

## 2023-12-14 LAB — D-DIMER, QUANTITATIVE: D-Dimer, Quant: 0.6 ug{FEU}/mL — ABNORMAL HIGH (ref 0.00–0.50)

## 2023-12-14 MED ORDER — LACTATED RINGERS IV BOLUS
500.0000 mL | Freq: Once | INTRAVENOUS | Status: AC
  Administered 2023-12-14: 500 mL via INTRAVENOUS

## 2023-12-14 NOTE — ED Notes (Signed)
Patient verbalizes understanding of discharge instructions. Opportunity for questioning and answers were provided. Armband removed by staff, pt discharged from ED. Ambulated out to lobby with wife  

## 2023-12-14 NOTE — ED Provider Notes (Signed)
 [facility] Provider Note    Event Date/Time   First MD Initiated Contact with Patient 12/14/23 1459     (approximate)   History   Chest Pain   HPI  Edgar Huynh is a 68 y.o. male history of hypertension, hyperlipidemia, neuropathy, diabetes, COPD, anxiety, presenting with chest pain, shortness of breath and lightheadedness.  Patient states that has been dealing with symptoms for a while but at his wife's doctor's office today, felt very lightheaded, did not pass out but felt like he could not go home and was brought to emergency department.  He states that he has been coughing but no fever, no leg swelling.  No previous history of DVTs, no history of cancer, no recent travel or surgeries.  States that his chest pain is resolving, has a very slight short of breath right now.  No back pain.  No recent falls.  Not on any blood thinners.  Per independent history obtained from wife, patient is able to ambulate slightly without assistance, went to see his cardiologist a couple days ago and scheduled for stress test and an echo.  On independent chart review, he was seen by cardiology on 24 April, he has history of prior stroke, tardive dyskinesia/drug-induced parkinsonism, and a nuclear stress test in 2022 that showed no ischemia, and normal EF on his echo in 2022.  Wife had told cardiology that he has had worsening dyspnea on exertion for the last several months, no orthopnea or anginal chest pain.  He is on Plavix  from prior history of CVA.     Physical Exam   Triage Vital Signs: ED Triage Vitals  Encounter Vitals Group     BP 12/14/23 1216 127/76     Systolic BP Percentile --      Diastolic BP Percentile --      Pulse Rate 12/14/23 1216 (!) 101     Resp 12/14/23 1216 18     Temp 12/14/23 1216 98.2 F (36.8 C)     Temp Source 12/14/23 1216 Oral     SpO2 12/14/23 1216 98 %     Weight 12/14/23 1216 165 lb (74.8 kg)     Height 12/14/23 1216 5\' 8"  (1.727 m)      Head Circumference --      Peak Flow --      Pain Score 12/14/23 1223 1     Pain Loc --      Pain Education --      Exclude from Growth Chart --     Most recent vital signs: Vitals:   12/14/23 1725 12/14/23 1730  BP:  130/76  Pulse:  79  Resp:  13  Temp: 98.1 F (36.7 C)   SpO2:  98%     General: Awake, no distress.  CV:  Good peripheral perfusion.  Resp:  Normal effort.  Clear Abd:  No distention.  Soft nontender Other:  No lower extremity edema, no unilateral calf swelling or tenderness   ED Results / Procedures / Treatments   Labs (all labs ordered are listed, but only abnormal results are displayed) Labs Reviewed  BASIC METABOLIC PANEL WITH GFR - Abnormal; Notable for the following components:      Result Value   Glucose, Bld 160 (*)    All other components within normal limits  URINALYSIS, W/ REFLEX TO CULTURE (INFECTION SUSPECTED) - Abnormal; Notable for the following components:   Color, Urine YELLOW (*)    APPearance CLEAR (*)  Ketones, ur 20 (*)    All other components within normal limits  D-DIMER, QUANTITATIVE - Abnormal; Notable for the following components:   D-Dimer, Quant 0.60 (*)    All other components within normal limits  RESP PANEL BY RT-PCR (RSV, FLU A&B, COVID)  [id number]  CBC  BRAIN NATRIURETIC PEPTIDE  TROPONIN I (HIGH SENSITIVITY)  TROPONIN I (HIGH SENSITIVITY)     EKG  Sinus tachycardia, rate 1 1, normal QRS, normal QTc, baseline is wandering, T wave flattening in 3, no ischemic ST elevation, not significant compared to prior   RADIOLOGY Chest x-ray on my depend interpretation without consolidation   PROCEDURES:  Critical Care performed: No  Procedures   MEDICATIONS ORDERED IN ED: Medications  lactated ringers  bolus 500 mL (0 mLs Intravenous Stopped 12/14/23 1743)     IMPRESSION / MDM / ASSESSMENT AND PLAN / ED COURSE  I reviewed the triage vital signs and the nursing notes.                               Differential diagnosis includes, but is not limited to, ACS, angina, consider CHF but he does not appear volume overloaded at this time, consider pneumonia viral illness given his cough and shortness of breath, for his generalized weakness as well as lightheadedness, consider mild dehydration, electrolyte derangements, UTI.  Also considered PE, other than the tachycardia, he is not hypoxic, no unilateral calf swelling or tenderness, no history of blood clots, no history of malignancy or travel.  At D-dimer, labs, chest x-ray, EKG, respiratory viral panel, UA, IV fluids.  Reassess.  Patient's presentation is most consistent with acute presentation with potential threat to life or bodily function.  Independent review of labs imaging below.  On reassessment patient is asymptomatic at this time.  Had a sensitive discussion with wife and patient about keeping himself hydrated, following with his cardiologist for further management of his symptoms, and to return if he has recurrent symptoms or any other concerns.  Discussed with them as well about labs as well as imaging results.  Rest of clinical course as below.  Otherwise considered but no indication for inpatient admission at this time, he is safe for outpatient management.  Will discharge with strict return precautions.  The patient is on the cardiac monitor to evaluate for evidence of arrhythmia and/or significant heart rate changes.   Clinical Course as of 12/14/23 1809  Wed Dec 14, 2023  1512 DG Chest 2 View No acute cardiopulmonary disease.  [TT]  1658 Independent review of labs, respiratory viral panel is negative, troponin x 2 is negative, electrolytes not severely deranged, no leukocytosis, BNP is normal, D-dimer is age-adjusted negative. [TT]  1744 On reassessment patient does not have any symptoms at this time, is not lightheaded.  Was able to get ambulatory sat on him, he was satting high 90s on room air without any tachypnea or  lightheadedness or shortness of breath when he was ambulating. [TT]    Clinical Course User Index [TT] Drenda Gentle, Richard Champion, MD     FINAL CLINICAL IMPRESSION(S) / ED DIAGNOSES   Final diagnoses:  Chest pain, unspecified type  Shortness of breath  Lightheadedness     Rx / DC Orders   ED Discharge Orders     None        Note:  This document was prepared using Dragon voice recognition software and may include unintentional dictation errors.  Shane Darling, MD 12/14/23 [phone]

## 2023-12-14 NOTE — ED Notes (Signed)
Pt attempting to give urine sample at this time.

## 2023-12-14 NOTE — ED Notes (Signed)
 Pt ambulated with this RN, did well throughout and did not c/o any dizziness or sob. Sats remained >96% on room air

## 2023-12-14 NOTE — ED Triage Notes (Signed)
 Pt c/o CP "for many months." Pt says he gets short of breath with exertion. Pt was accompanying his wife to her doctor's appointment today when he felt like he was about to faint. Pt is seeing a cardiologist outpatient for evaluation of this chest pain.

## 2023-12-14 NOTE — Discharge Instructions (Signed)
 Please make sure to follow-up with your cardiologist for further management of your symptoms.  Please go to your appointment scheduled for your stress test as well as her echo.

## 2023-12-15 ENCOUNTER — Encounter: Payer: Self-pay | Admitting: Gastroenterology

## 2023-12-19 ENCOUNTER — Ambulatory Visit: Payer: Medicare Other | Admitting: Physical Therapy

## 2023-12-19 ENCOUNTER — Ambulatory Visit: Payer: Medicare Other | Admitting: Speech Pathology

## 2023-12-21 ENCOUNTER — Ambulatory Visit: Payer: Medicare Other | Admitting: Physical Therapy

## 2023-12-21 ENCOUNTER — Ambulatory Visit: Payer: Medicare Other | Admitting: Speech Pathology

## 2023-12-22 ENCOUNTER — Ambulatory Visit: Admission: RE | Admit: 2023-12-22 | Source: Home / Self Care | Admitting: Gastroenterology

## 2023-12-22 HISTORY — DX: Degenerative disease of nervous system, unspecified: G31.9

## 2023-12-22 HISTORY — DX: Personal history of other diseases of the circulatory system: Z86.79

## 2023-12-22 HISTORY — DX: Chronic motor or vocal tic disorder: F95.1

## 2023-12-22 HISTORY — DX: Eosinophilic esophagitis: K20.0

## 2023-12-22 HISTORY — DX: Diplopia: H53.2

## 2023-12-22 HISTORY — DX: Type 2 diabetes mellitus with diabetic polyneuropathy: E11.42

## 2023-12-22 HISTORY — DX: Chronic diastolic (congestive) heart failure: I50.32

## 2023-12-22 SURGERY — COLONOSCOPY
Anesthesia: General

## 2023-12-26 ENCOUNTER — Ambulatory Visit: Payer: Medicare Other | Admitting: Physical Therapy

## 2023-12-26 ENCOUNTER — Ambulatory Visit: Payer: Medicare Other | Admitting: Speech Pathology

## 2023-12-28 ENCOUNTER — Ambulatory Visit: Payer: Medicare Other | Admitting: Speech Pathology

## 2023-12-28 ENCOUNTER — Ambulatory Visit: Payer: Medicare Other | Admitting: Physical Therapy

## 2023-12-29 ENCOUNTER — Ambulatory Visit
Admission: RE | Admit: 2023-12-29 | Discharge: 2023-12-29 | Disposition: A | Discharge status: Home / Self Care | Source: Ambulatory Visit | Attending: Cardiology | Admitting: Cardiology

## 2023-12-29 DIAGNOSIS — E1042 Type 1 diabetes mellitus with diabetic polyneuropathy: Secondary | ICD-10-CM | POA: Insufficient documentation

## 2023-12-29 DIAGNOSIS — I10 Essential (primary) hypertension: Secondary | ICD-10-CM | POA: Insufficient documentation

## 2023-12-29 DIAGNOSIS — E785 Hyperlipidemia, unspecified: Secondary | ICD-10-CM | POA: Diagnosis present

## 2023-12-29 DIAGNOSIS — E1069 Type 1 diabetes mellitus with other specified complication: Secondary | ICD-10-CM | POA: Diagnosis present

## 2023-12-29 DIAGNOSIS — R0602 Shortness of breath: Secondary | ICD-10-CM | POA: Insufficient documentation

## 2023-12-29 DIAGNOSIS — R0609 Other forms of dyspnea: Secondary | ICD-10-CM | POA: Diagnosis present

## 2023-12-29 LAB — NM MYOCAR MULTI W/SPECT W/WALL MOTION / EF
Base ST Depression (mm): 0 mm
Estimated workload: 1
Exercise duration (min): 1 min
Exercise duration (sec): 0 s
LV dias vol: 56 mL (ref 62–150)
LV sys vol: 15 mL
MPHR: 152 {beats}/min
Nuc Stress EF: 73 %
Peak HR: 111 {beats}/min
Percent HR: 73 %
Rest HR: 92 {beats}/min
Rest Nuclear Isotope Dose: 11 mCi
SDS: 0
SRS: 2
SSS: 0
Stress Nuclear Isotope Dose: 31.8 mCi
TID: 1.04

## 2023-12-29 MED ORDER — TECHNETIUM TC 99M TETROFOSMIN IV KIT
10.9600 | PACK | Freq: Once | INTRAVENOUS | Status: AC | PRN
Start: 2023-12-29 — End: 2023-12-29
  Administered 2023-12-29: 10.96 via INTRAVENOUS

## 2023-12-29 MED ORDER — TECHNETIUM TC 99M TETROFOSMIN IV KIT
31.7600 | PACK | Freq: Once | INTRAVENOUS | Status: AC | PRN
  Administered 2023-12-29: 31.76 via INTRAVENOUS

## 2023-12-29 MED ORDER — REGADENOSON 0.4 MG/5ML IV SOLN
0.4000 mg | Freq: Once | INTRAVENOUS | Status: AC
  Administered 2023-12-29: 0.4 mg via INTRAVENOUS
  Filled 2023-12-29: qty 5

## 2024-01-02 ENCOUNTER — Ambulatory Visit: Payer: Medicare Other | Admitting: Speech Pathology

## 2024-01-02 ENCOUNTER — Ambulatory Visit: Payer: Medicare Other | Admitting: Physical Therapy

## 2024-01-04 ENCOUNTER — Ambulatory Visit: Payer: Medicare Other | Admitting: Physical Therapy

## 2024-01-04 ENCOUNTER — Ambulatory Visit: Payer: Medicare Other | Admitting: Speech Pathology

## 2024-01-11 ENCOUNTER — Ambulatory Visit: Payer: Medicare Other | Admitting: Physical Therapy

## 2024-01-11 ENCOUNTER — Ambulatory Visit: Payer: Medicare Other | Admitting: Speech Pathology

## 2024-01-16 ENCOUNTER — Ambulatory Visit: Payer: Medicare Other | Admitting: Physical Therapy

## 2024-01-16 ENCOUNTER — Ambulatory Visit: Payer: Medicare Other | Admitting: Speech Pathology

## 2024-01-17 NOTE — Progress Notes (Signed)
 Virtual Visit via Video Note  I connected with Edgar Huynh on 01/25/24 at  3:30 PM EDT by a video enabled telemedicine application and verified that I am speaking with the correct person using two identifiers.  Location: Patient: home Provider: home office Persons participated in the visit- patient, provider    I discussed the limitations of evaluation and management by telemedicine and the availability of in person appointments. The patient expressed understanding and agreed to proceed.   I discussed the assessment and treatment plan with the patient. The patient was provided an opportunity to ask questions and all were answered. The patient agreed with the plan and demonstrated an understanding of the instructions.   The patient was advised to call back or seek an in-person evaluation if the symptoms worsen or if the condition fails to improve as anticipated.    Todd Fossa, MD    [facility] MD/PA/NP OP Progress Note  01/25/2024 4:08 PM Edgar Huynh  MRN:  [id number]  Chief Complaint:  Chief Complaint  Patient presents with   Follow-up   HPI:  According to the chart review, the following events have occurred since the last visit: The patient was seen by cardiologist, KRISHNA CHAITANYA ALLURI, MD for exertional dyspnea. Recent cardiac evaluation with normal stress test with no ischemia. Echocardiogram with normal biventricular systolic function no significant valvular abnormality. Euvolemic on exam with normal BNP. No cardiac etiology for exertional dyspnea which is currently stable. Blood pressure well-controlled, continue current antihypertensives. Continue statin. He is on Plavix  with prior history of stroke.   This is a follow-up appointment for depression, anxiety and insomnia.  He states that he has been doing well.  He enjoys pool.  His wife is doing well.  He has been able to manage taking care of her.  He continues to have some dizziness especially when he stands up.   He continues to experience tremors, and dysarthria, which is not better.  He has an upcoming appointment with neurologist in a few weeks.  He continues to take clonazepam , which he believes is for neuralgia.  He denies any anxiety, and agrees to discuss this medication use with his provider.  He denies feeling depressed.  He has fair appetite, and feels good about the weight loss since being on Wegovy.  He denies SI.  He agrees with the plans as outlined below.   Wt Readings from Last 3 Encounters:  12/14/23 161 lb (73 kg)  10/21/23 175 lb (79.4 kg)  02/11/23 192 lb 3.9 oz (87.2 kg)      Visit Diagnosis:    ICD-10-CM   1. MDD (major depressive disorder), recurrent, in full remission (HCC)  F33.42     2. Panic disorder  F41.0     3. Insomnia, unspecified type  G47.00       Past Psychiatric History: Please see initial evaluation for full details. I have reviewed the history. No updates at this time.     Past Medical History:  Past Medical History:  Diagnosis Date   Anxiety    Chronic diastolic CHF (congestive heart failure) (HCC)    Chronic motor tic disorder    COPD (chronic obstructive pulmonary disease) (HCC)    Depression    Diabetic polyneuropathy (HCC)    Diffuse cerebral atrophy (HCC)    Diplopia    Esophagitis, eosinophilic    GERD (gastroesophageal reflux disease)    Glaucoma    History of carotid stenosis    History of colonic polyps  History of kidney stones    Hypercholesteremia    Hyperlipidemia    Hypertension    Neuropathy    Osteoarthritis of left knee    Sleep apnea    Stroke (HCC) 12/10/1996   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   TIA (transient ischemic attack)    Type 1 diabetes (HCC)    on insulin  pump    Past Surgical History:  Procedure Laterality Date   CAROTID ARTERY ANGIOPLASTY Right 10/18/2004   CATARACT EXTRACTION, BILATERAL Bilateral    COLONOSCOPY     COLONOSCOPY WITH PROPOFOL  N/A 08/25/2018   Procedure:  COLONOSCOPY WITH PROPOFOL ;  Surgeon: Cassie Click, MD;  Location: [facility] ENDOSCOPY;  Service: Endoscopy;  Laterality: N/A;   CYSTOSCOPY     ESOPHAGOGASTRODUODENOSCOPY     ESOPHAGOGASTRODUODENOSCOPY N/A 09/01/2022   Procedure: ESOPHAGOGASTRODUODENOSCOPY (EGD);  Surgeon: Toledo, Alphonsus Jeans, MD;  Location: [facility] ENDOSCOPY;  Service: Gastroenterology;  Laterality: N/A;   ESOPHAGOGASTRODUODENOSCOPY (EGD) WITH PROPOFOL  N/A 08/25/2018   Procedure: ESOPHAGOGASTRODUODENOSCOPY (EGD) WITH PROPOFOL ;  Surgeon: Cassie Click, MD;  Location: [facility] ENDOSCOPY;  Service: Endoscopy;  Laterality: N/A;   EYE SURGERY     JOINT REPLACEMENT     PARTIAL KNEE ARTHROPLASTY Left 01/05/2022   Procedure: UNICOMPARTMENTAL KNEE;  Surgeon: Elner Hahn, MD;  Location: [facility] ORS;  Service: Orthopedics;  Laterality: Left;   WISDOM TOOTH EXTRACTION      Family Psychiatric History: Please see initial evaluation for full details. I have reviewed the history. No updates at this time.     Family History:  Family History  Problem Relation Age of Onset   Parkinson's disease Father    Diabetes Mellitus II Brother     Social History:  Social History   Socioeconomic History   Marital status: Married    Spouse name: Leary Provencal   Number of children: 2   Years of education: Not on file   Highest education level: Some college, no degree  Occupational History   Not on file  Tobacco Use   Smoking status: Never   Smokeless tobacco: Never  Vaping Use   Vaping status: Never Used  Substance and Sexual Activity   Alcohol use: Not Currently    Comment: occasional   Drug use: Never   Sexual activity: Not on file    Comment: not asked if sexually active  Other Topics Concern   Not on file  Social History Narrative   Not on file   Social Drivers of Health   Financial Resource Strain: Medium Risk (09/12/2023)   Received from [facility]   Overall Financial Resource Strain (CARDIA)    Difficulty of Paying  Living Expenses: Somewhat hard  Food Insecurity: No Food Insecurity (09/12/2023)   Received from [facility]   Hunger Vital Sign    Worried About Running Out of Food in the Last Year: Never true    Ran Out of Food in the Last Year: Never true  Transportation Needs: No Transportation Needs (09/12/2023)   Received from [facility] - Transportation    In the past 12 months, has lack of transportation kept you from medical appointments or from getting medications?: No    Lack of Transportation (Non-Medical): No  Physical Activity: Not on file  Stress: Not on file  Social Connections: Not on file    Allergies:  Allergies  Allergen Reactions   Other Rash    Clonidine 0.3 mg patch (adhesive caused the irritation)  Metabolic Disorder Labs: Lab Results  Component Value Date   HGBA1C 7.2 (H) 04/09/2021   MPG 159.94 04/09/2021   MPG 151 03/02/2017   Lab Results  Component Value Date   PROLACTIN 25.6 (H) 03/02/2017   Lab Results  Component Value Date   CHOL 151 04/10/2021   TRIG 134 04/10/2021   HDL 46 04/10/2021   CHOLHDL 3.3 04/10/2021   VLDL 27 04/10/2021   LDLCALC 78 04/10/2021   LDLCALC 119 (H) 03/02/2017   Lab Results  Component Value Date   TSH 1.582 11/09/2022   TSH 0.937 04/10/2021    Therapeutic Level Labs: No results found for: LITHIUM No results found for: VALPROATE No results found for: CBMZ  Current Medications: Current Outpatient Medications  Medication Sig Dispense Refill   albuterol  (VENTOLIN  HFA) 108 (90 Base) MCG/ACT inhaler Inhale 2 puffs into the lungs every 8 (eight) hours as needed.     Alpha-Lipoic Acid 200 MG CAPS Take 200 mg by mouth daily.     amLODipine  (NORVASC ) 5 MG tablet Take 5 mg by mouth every evening.     atorvastatin  (LIPITOR) 20 MG tablet Take 20 mg by mouth every evening.      budesonide-formoterol (SYMBICORT) 80-4.5 MCG/ACT inhaler Inhale 2 puffs into the lungs in the morning  and at bedtime.     busPIRone  (BUSPAR ) 30 MG tablet Take 1 tablet (30 mg total) by mouth 2 (two) times daily. 60 tablet 3   CALCIUM  PO Take 1,200 mg by mouth daily.     CLINPRO 5000 1.1 % PSTE SMARTSIG:Sparingly To Teeth Daily     clonazePAM  (KLONOPIN ) 0.5 MG tablet Take 0.5 mg by mouth 2 (two) times daily as needed for anxiety.     clopidogrel  (PLAVIX ) 75 MG tablet Take 75 mg by mouth daily.     Coenzyme Q10 (COQ-10) 100 MG CAPS Take 100 mg by mouth daily.     escitalopram  (LEXAPRO ) 20 MG tablet Take 1 tablet (20 mg total) by mouth daily. 30 tablet 3   furosemide  (LASIX ) 20 MG tablet Take 20 mg by mouth daily.     gabapentin  (NEURONTIN ) 100 MG capsule Take 300 mg by mouth 3 (three) times daily.     glucose blood (CONTOUR NEXT TEST) test strip TEST BLOOD SUGAR 7 TIMES A DAY AS DIRECTED     HUMALOG 100 UNIT/ML injection Via Insulin  Pump     hydrocortisone  2.5 % cream Apply 1 Application topically See admin instructions. Apply on Monday Wednesday and Friday to affected areas of face 30 g 11   ketoconazole  (NIZORAL ) 2 % cream Apply 1 Application topically See admin instructions. Apply to face Tues Thurs Sat 30 g 11   latanoprost  (XALATAN ) 0.005 % ophthalmic solution Place 1 drop into both eyes at bedtime.     Multiple Vitamin (MULTIVITAMIN WITH MINERALS) TABS tablet Take 1 tablet by mouth daily.     pantoprazole  (PROTONIX ) 40 MG tablet Take 40 mg by mouth daily.     Potassium 99 MG TABS Take 2 tablets by mouth daily.     traZODone  (DESYREL ) 100 MG tablet Take 1 tablet (100 mg total) by mouth at bedtime. 30 tablet 3   valbenazine (INGREZZA) 80 MG capsule Take 60 mg by mouth daily.     No current facility-administered medications for this visit.     Musculoskeletal: Strength & Muscle Tone: N/A Gait & Station: N/A Patient leans: N/A  Psychiatric Specialty Exam: Review of Systems  Psychiatric/Behavioral:  Negative for agitation, behavioral problems,  confusion, decreased concentration,  dysphoric mood, hallucinations, self-injury, sleep disturbance and suicidal ideas. The patient is not nervous/anxious and is not hyperactive.   All other systems reviewed and are negative.   There were no vitals taken for this visit.There is no height or weight on file to calculate BMI.  General Appearance: Well Groomed  Eye Contact:  Good  Speech:  Garbled  Volume:  Normal  Mood:  good  Affect:  Appropriate, Congruent, and Restricted  Thought Process:  Coherent  Orientation:  Full (Time, Place, and Person)  Thought Content: Logical   Suicidal Thoughts:  No  Homicidal Thoughts:  No  Memory:  Immediate;   Good  Judgement:  Good  Insight:  Good  Psychomotor Activity:  Normal  Concentration:  Concentration: Good and Attention Span: Good  Recall:  Good  Fund of Knowledge: Good  Language: Good  Akathisia:  No  Handed:  Right  AIMS (if indicated): not done  Assets:  Communication Skills Desire for Improvement  ADL's:  Intact  Cognition: WNL  Sleep:  Fair   Screenings: AIMS    Flowsheet Row [facility]  AIMS Total Score 0      AUDIT    Flowsheet Row [facility]  Alcohol Use Disorder Identification Test Final Score (AUDIT) 0      GAD-7    Flowsheet Row Office Visit from 11/09/2022 in [facility]  Total GAD-7 Score 9      PHQ2-9    Flowsheet Row [facility] Visit [facility]09/2022 in [facility] Nutrition from 08/26/2021 in [facility] Total Score 2 0  PHQ-9 Total Score 6 --      Flowsheet Row ED from 12/14/2023 in [facility] ED from 10/21/2023 in [facility] at [facility] ED to Hosp-Admission (Discharged) from  02[facility]11/2023 in [facility]  C-SSRS RISK CATEGORY No Risk No Risk No Risk        Assessment and Plan:  Edgar Huynh is a 68 y.o. year old male with a history of depression, anxiety, Complex motor tics, type I diabetes, history of right pontine stroke,  TBI, hypertension, hyperlipidemia,   OSA on CPAP, OA, eosinophilic esophagitis, who presents for follow up appointment for below.    1. MDD (major depressive disorder), recurrent, in full remission (HCC) 2. Panic disorder Acute stressors include: loss of his mother, recent admission for DKA Other stressors include: Takes care of his wife, who uses a walker. Retirement (he describes satisfaction in this)    History: anxiety for many years. 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   He denies any depressive symptoms or panic attacks since the previous visit.  Will continue Lexapro  to target depression and anxiety.  Will continue BuSpar  for anxiety given he reports significant benefit.  Noted that he has been on clonazepam , prescribed by other provider.  He believes he is taking this for neuropathic pain. He agrees to discuss the indication with his provider to ensure appropriate use and minimize the risk of polypharmacy.  3. Insomnia, unspecified type - on CPAP machine    He reports significant benefit from trazodone .  Will continue the current dose to target insomnia.    # r/o polypharmacy  He is  on gabapentin , clonazepam , and trazodone , and all of these medication can contribute to drowsiness during the day.  He was advised again to discuss with his primary care to hopefully taper down clonazepam .     # benzodiazepine dependence - on clonazepam  0.5 mg BID since 20's Unchanged. The medication is prescribed by another provider. He is advised this medication to be tapered off in the future to avoid long-term risks.   # weight loss He has had significant weight loss, which he  attributed to [facility].  He reportedly contacted his provider, and the dose was reduced.  Will continue to monitor and then assess as needed.    # dysarthria He has several physical symptoms including dysarthria, some movement issues, and weight loss.  He is followed by a neurologist, and Ingrezza was tapered down due to the thought that it is medication induced parkinsonism.  Will continue to monitor and assess as needed.    Plan Continue lexapro  20 mg daily  Continue buspirone  30 mg twice a day  Continue Trazodone  25-50 mg at night as needed for insomnia Next appointment: 8/13 at 2 pm, video - on gabapentin  300 mg TID for tic, ingrezza 60 mg daily for slurred speech - on clonazepam  0.5 mg twice a day  - on GOLO, OTC   The patient demonstrates the following risk factors for suicide: Chronic risk factors for suicide include: psychiatric disorder of depression, anxiety . Acute risk factors for suicide include: loss (financial, interpersonal, professional). Protective factors for this patient include: coping skills and hope for the future. Considering these factors, the overall suicide risk at this point appears to be low. Patient is appropriate for outpatient follow up.   Collaboration of Care: Collaboration of Care: Other reviewed notes in Epic  Patient/Guardian was advised Release of Information must be obtained prior to any record release in order to collaborate their care with an outside provider. Patient/Guardian was advised if they have not already done so to contact the registration department to sign all necessary forms in order for us  to release information regarding their care.   Consent: Patient/Guardian gives verbal consent for treatment and assignment of benefits for services provided during this visit. Patient/Guardian expressed understanding and agreed to proceed.    Todd Fossa, MD 01/25/2024, 4:08 PM

## 2024-01-18 ENCOUNTER — Ambulatory Visit: Payer: Medicare Other | Admitting: Physical Therapy

## 2024-01-18 ENCOUNTER — Ambulatory Visit: Payer: Medicare Other | Admitting: Speech Pathology

## 2024-01-23 ENCOUNTER — Ambulatory Visit: Payer: Medicare Other | Admitting: Speech Pathology

## 2024-01-23 ENCOUNTER — Ambulatory Visit: Payer: Medicare Other | Admitting: Physical Therapy

## 2024-01-25 ENCOUNTER — Ambulatory Visit: Payer: Medicare Other | Admitting: Physical Therapy

## 2024-01-25 ENCOUNTER — Telehealth (INDEPENDENT_AMBULATORY_CARE_PROVIDER_SITE_OTHER): Payer: Self-pay | Admitting: Psychiatry

## 2024-01-25 ENCOUNTER — Ambulatory Visit: Payer: Medicare Other | Admitting: Speech Pathology

## 2024-01-25 ENCOUNTER — Encounter: Payer: Self-pay | Admitting: Psychiatry

## 2024-01-25 DIAGNOSIS — F410 Panic disorder [episodic paroxysmal anxiety] without agoraphobia: Secondary | ICD-10-CM

## 2024-01-25 DIAGNOSIS — F3342 Major depressive disorder, recurrent, in full remission: Secondary | ICD-10-CM | POA: Diagnosis not present

## 2024-01-25 DIAGNOSIS — G4700 Insomnia, unspecified: Secondary | ICD-10-CM | POA: Diagnosis not present

## 2024-01-25 NOTE — Patient Instructions (Signed)
 Continue lexapro  20 mg daily  Continue buspirone  30 mg twice a day  Continue Trazodone  25-50 mg at night as needed for insomnia Next appointment: 8/13 at 2 pm

## 2024-01-30 ENCOUNTER — Ambulatory Visit: Payer: Medicare Other | Admitting: Speech Pathology

## 2024-01-30 ENCOUNTER — Ambulatory Visit: Payer: Medicare Other | Admitting: Physical Therapy

## 2024-02-01 ENCOUNTER — Ambulatory Visit: Payer: Medicare Other | Admitting: Physical Therapy

## 2024-02-01 ENCOUNTER — Ambulatory Visit: Payer: Medicare Other | Admitting: Speech Pathology

## 2024-02-06 ENCOUNTER — Ambulatory Visit: Payer: Medicare Other | Admitting: Speech Pathology

## 2024-02-06 ENCOUNTER — Ambulatory Visit: Payer: Medicare Other | Admitting: Physical Therapy

## 2024-02-08 ENCOUNTER — Ambulatory Visit: Payer: Medicare Other | Admitting: Speech Pathology

## 2024-02-08 ENCOUNTER — Ambulatory Visit: Payer: Medicare Other | Admitting: Physical Therapy

## 2024-02-13 ENCOUNTER — Ambulatory Visit: Payer: Medicare Other | Admitting: Physical Therapy

## 2024-02-13 ENCOUNTER — Ambulatory Visit: Payer: Medicare Other | Admitting: Speech Pathology

## 2024-02-15 ENCOUNTER — Ambulatory Visit: Payer: Medicare Other | Admitting: Speech Pathology

## 2024-02-15 ENCOUNTER — Ambulatory Visit: Payer: Medicare Other | Admitting: Physical Therapy

## 2024-02-20 ENCOUNTER — Ambulatory Visit: Payer: Medicare Other | Admitting: Physical Therapy

## 2024-02-20 ENCOUNTER — Ambulatory Visit: Payer: Medicare Other | Admitting: Speech Pathology

## 2024-02-22 ENCOUNTER — Ambulatory Visit: Payer: Medicare Other | Admitting: Speech Pathology

## 2024-02-22 ENCOUNTER — Ambulatory Visit: Payer: Medicare Other | Admitting: Physical Therapy

## 2024-02-27 ENCOUNTER — Ambulatory Visit: Payer: Medicare Other | Admitting: Physical Therapy

## 2024-02-27 ENCOUNTER — Ambulatory Visit: Payer: Medicare Other | Admitting: Speech Pathology

## 2024-02-27 NOTE — Progress Notes (Signed)
 HPI:   Edgar Huynh returns for follow up type 1 diabetes.  He is a 68 y.o. male who was diagnosed with type 1 diabetes in 66 as a freshman in college.  I last saw him in 3/25.  He got a new pump since then and we also lowered his Wegovy.SABRA   He is currently Humalog via the T slim pump (new in 3/25).  He is also on Weogvy 1.7 mg weekly: Basal rates MN=0.95 3am=1.15 6am=1.1 10am=1.0 TDD basal: 24.7 units   Bolus settings I/C: 3.5 ISF: 40 Target Glucose: 110 Active insulin  time: 5 hours  Total Daily dose approximately 50 units.  He is on the Dexcom G7 for his CGM.  His Dexcom was downloaded and reviewed.  The average was 154 with a standard deviation of 55. His TIR is 71%.   He often does not bolus ahead meals.  Review of his diet shows he avoids concentrated carb.  He does not do much exercise. His weight today is 154 (44 pounds down when he started [facility] in 10/24 and he weighed 198).  He is down 17 pounds from last visit.  He denies discomfort as well as numbness in his feet.  He is concerned about his diabetes.   ROS:  No chest pain.  No shortness of breath.   Medical History: Past Medical History:  Diagnosis Date  . Anxiety disorder   . Chronic diastolic CHF (congestive heart failure) (CMS/HHS-HCC) 04/14/2021  . Depression   . Diabetes mellitus type 2, uncomplicated (CMS/HHS-HCC)   . 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 GERD (gastroesophageal reflux disease)   . Glaucoma (increased eye pressure)   . History of colon polyps 10/21/2017  . Hyperlipidemia   . Hypertension   . Neuropathy   . Primary osteoarthritis of left knee 11/27/2021  . Stroke (CMS/HHS-HCC)     Surgical History: Past Surgical History:  Procedure Laterality Date  . COLONOSCOPY  08/25/2018   PH Adenomatous Polyps: [facility]BF 08/2023  (09/13/2023 Recall letter returned.awb)  . EGD  08/25/2018   Eosinophilic Esophagitis: CBF 08/2021  . Left unicondylar knee arthroplasty. Left 01/05/2022   Dr. Edie  . EGD @ [facility]   09/01/2022   Eosinophilic esophagitis/Gastritis/Repeat 31yrs/TKT  . CATARACT EXTRACTION Bilateral   . COLONOSCOPY    . OTHER SURGERY     ARTERY   . UPPER GASTROINTESTINAL ENDOSCOPY      Social History:  reports that he has never smoked. He has never used smokeless tobacco. He reports that he does not currently use alcohol. He reports that he does not use drugs.  Married.  He works for [employer].  Family History: family history includes Diabetes type II in his brother; No Known Problems in his mother; Parkinsonism in his father.  Medications: Current Outpatient Medications  Medication Sig Dispense Refill  . albuterol  MDI, PROVENTIL , VENTOLIN , PROAIR , HFA 90 mcg/actuation inhaler INHALE 2 INHALATIONS INTO THE LUNGS THREE TIMES DAILY 13.4 g 1  . alpha lipoic acid 100 mg Cap Take 100 mg by mouth once daily    . amLODIPine  (NORVASC ) 5 MG tablet Take 1 tablet (5 mg total) by mouth once daily 90 tablet 1  . atorvastatin  (LIPITOR) 20 MG tablet Take 1 tablet (20 mg total) by mouth once daily 90 tablet 2  . blood-glucose sensor (DEXCOM G6 SENSOR) Devi Use 1 each every 10 (ten) days 9 each 3  . blood-glucose transmitter Devi Use 1 each every 3 (three) months 1 each 3  . budesonide-formoteroL (SYMBICORT) 80-4.5  mcg/actuation inhaler Inhale 2 inhalations into the lungs 2 (two) times daily    . busPIRone  (BUSPAR ) 30 MG tablet Take 30 mg by mouth 2 (two) times daily    . clonazePAM  (KLONOPIN ) 0.5 MG tablet Take 1 tablet (0.5 mg total) by mouth 2 (two) times daily as needed for up to 90 days 60 tablet 2  . clopidogreL  (PLAVIX ) 75 mg tablet Take 1 tablet (75 mg total) by mouth once daily 90 tablet 1  . coenzyme Q10 10 mg capsule Take 100 mg by mouth once daily    . diclofenac (VOLTAREN) 1 % topical gel APPLY 2 GRAMS EXTERNALLY TO THE AFFECTED AREA FOUR TIMES DAILY (Patient taking differently: 4 (four) times daily as needed) 100 g 11  . escitalopram  oxalate (LEXAPRO ) 10 MG tablet Take 1.5 tablets  (15 mg total) by mouth once daily for 90 days 45 tablet 2  . FUROsemide  (LASIX ) 20 MG tablet TAKE 1 TABLET(20 MG) BY MOUTH DAILY AS NEEDED 90 tablet 1  . gabapentin  (NEURONTIN ) 100 MG capsule 1-3 capsules 3 times daily as directed 810 capsule 11  . hydrocortisone  2.5 % cream Apply topically 2 (two) times daily as needed    . insulin  LISPRO (HUMALOG U-100 INSULIN ) injection (concentration 100 units/mL) INJECT UP TO 100 UNITS VIA PUMP DAILY AS DIRECTED BY MD 90 mL 4  . ketoconazole  (NIZORAL ) 2 % cream Apply topically    . latanoprost  (XALATAN ) 0.005 % ophthalmic solution Apply 1 drop to eye at bedtime    . multivitamin tablet Take 1 tablet by mouth once daily    . pantoprazole  (PROTONIX ) 40 MG DR tablet Take 1 tablet (40 mg total) by mouth once daily 30 tablet 11  . potassium gluconate 2.5 mEq Tab Take 1 tablet by mouth once daily    . semaglutide (WEGOVY) 1.7 mg/0.75 mL pen injector Inject 0.75 mLs (1.7 mg total) subcutaneously once a week 3 mL 5  . SF 5000 PLUS 1.1 % APPLY PEA SIZED AMOUNT AND BRUSH FOR TWO MINUTES DAILY    . traZODone  (DESYREL ) 100 MG tablet Take 100 mg by mouth at bedtime    . valbenazine 60 mg Cap Take 60 mg by mouth once daily 30 capsule 11  . peg-electrolyte (NULYTELY) solution Use as directed. (Patient not taking: Reported on 12/15/2023) 4000 mL 0   No current facility-administered medications for this visit.    Allergies: Allergies  Allergen Reactions  . Robaxin  [Methocarbamol ] Swelling and Other (See Comments)    Knee and back Inflammation  . Other Rash    Clonidine 0.3 mg patch  Patches in general cause rashes- per patient    Physical Exam: Vitals:   02/27/24 1555  BP: 118/70  Pulse: 97  SpO2: 98%  Weight: 69.9 kg (154 lb)  Height: 172.7 cm (5' 8)     Body mass index is 23.42 kg/m. GENERAL: Pleasant, well-appearing male in no distress.   Foot Exam:  Bilateral feet were examined with socks and shoes removed.  His 10g filament is normal bilaterally.   He has no ulcers.  Distal pulses intact.  Physical exam otherwise deferred due to coronavirus precautions.   Labs: 03/02/17:  A1c=6.9.  Chol=206/193/48/119.  TSH=2.128.  Prl=25.6 04/22/17:  MA<8 07/29/17:  A1c=6.6.  K/Cr/Ca=4.5/0.9/9.3.  Spot MA=7.  LFTs nl.  TSH=0.685 09/23/17:  MRI of brain did not comment on any pituitary abnormality. 11/18/17:  A1c=6.8 03/24/18:  A1c=7.7.  K/Cr/Ca=4.1/0.9/9.9.  MA<7.  Chol=154/75/58.9/80.  LFTs nl.  PRL=11.3. 07/28/2018: A1c = 6.9  11/17/2018: A1c=7.6.  K/Cr/Ca=4.5/1.0/9.5.  MA<7.  Chol=181/199/46.5/95.  LFTs nl.  TSH=0.919.  Prolactin=10.5. 06/01/2019: K/Cr/Ca = 4.7/0.9/9.7.  LFTs normal.  TSH = 0.604 06/15/2019: A1c = 6.3 06/18/2019: K/Cr/Ca = 4.5/0.7/9.4.  LFTs nl.  D = 41.6.  B12 = 752.  Celiac panel negative.    10/19/2019: A1c = 6.2.   K/Cr/Ca=4.3/1.0/9.7.  MA<7.  Chol=136/43/59/68.  LFTs nl.  TSH=0.657.  Prolactin=4.4.  02/22/2020: A1c = 6.4 06/27/2020: A1c = 6.6  09/19/2020: K/Cr/Ca = 4.6/1.1/8.8.  Chol. = 115/94/48.9/47.  LFTs nl., except alk phos = 109.  TSH = 1.742.  B12 = 612.  12/26/2020: A1c = 6.9.  Fasting glucose=107.  C-peptide<0.1.  Prl=9.1. 04/09/2021: A1c = 7.2 04/10/2021:  K/Cr/Ca = 3.8/0.868.5.  Chol = 151/134/46/78.  TSH = 0.937.  04/30/2021:  K/Cr/Ca = 4.0/1.0/9.7  09/14/2021:  A1c = 6.9 12/25/2021:  K/Cr/Ca = 3.6/0.93/9.3.  LFTs nl.  02/08/2022:  A1c = 6.6 03/09/2022:  A1c = 7.0.  K/Cr/Ca = 4.0/1.0/9.1.  Chol = 140/154/48.7/61.  LFTs nl except for alk phos = 106.  TSH = 2.124. 06/14/2022:  A1c = 7.3  10/18/2022:  A1c = 7.5.  01/25/2023:  K/Cr/Ca = 4.8/1.1/9.4.  MA = 10.4.  Chol = 110/98/37.7/53.  LFTs nl except for alk phos = 119.  TSH = 1.463.  02/13/2023:  K/Cr/Ca = 3.4/0.8/8.1.  02/21/2023:  A1c = 6.9 05/12/2023:  K/Cr/Ca = 4.1/0.9/9.4.  LFTs nl.  TSH = 0.75 06/20/2023:  A1c = 7.5. 07/06/2023:  K/Cr/Ca=4.7/1.1/9.6.  LFTs nl.   10/21/2023:  K/Cr/Ca=3.4/0.961/9.6.   10/24/2023:  A1c=7.3.   12/01/2023: K/Cr/Ca = 3.9/1.1/9.4.  02/27/2024: A1c =  6.5.  Assessment/Plan: 1.  Type 1 diabetes.  His A1c today is 6.5.  Based on review of his CGM,  I will not make any changes today.  I encouraged lifestyle modifications and to bolus ahead of meals.   2.  Hypertension associated with diabetes.  Blood pressure is good today on 5 mg amlodipine  daily.  Lisinopril  was stopped previously due to a chronic cough.  3.  Hyperlipidemia associated with diabetes.  He is on 20 mg of atorvastatin  daily.  LDL was 53 in 6/24. I will plan to recheck next time if his pcp does not.   4.  Hx/o TIAs.  He is on plavix .  5.  Elevated Prolactin.  His prolactin has been mildly elevated in the past which was likely due to the risperidone he was on (for auditory hallucinations).  He was then on Abilify  which worked better.  He did have an MRI of his brain in 2/19.  His pituitary was not mentioned, so it is unlikely he has a pituitary tumor as the cause.  His most recent prolactins have been normal in 8/19, 4/20, 3/21 and again in 5/22.  He was off psychotropic medications for a time, but is now back risperidone.    6.  Retinopathy.   He has had injections in the past as well as cold laser.  We got a note from [facility] on 05/18/23.  He had no retinopathy and is to f/u in 10/25.       7.  Neuropathy.  Filament has been intermittently abnormal in the past, but it was normal by exam in 3/24 and 3/25.  We previously discussed foot care and he knows to be careful with his feet.  8.  Obesity.  His weight today is 154 today with BMI of 23.42.  He is down 17 pounds since last visit. He is  44 pounds down when he started [facility] in 10/24 and he weighed 198.  His pcp originally started him on Wegovy in 3/24 (weighed 208 at that time) and he lost 21 pounds in 3 months.  He then went off in 6/24 when he was in the hospital as he had GI issues with the higher dose.  I restarted him on the lowest weekly dose in 10/24 when he messaged that he had gained most of the weight back.  I  titrated his Wegovy from 0.5 mg to 2.4.  He messaged in 4/25 that he wants to cut back since he lost enough weight.  He is now on 1.7 mg weekly. I will make no changes to his dose. I encouraged lifestyle modifications.    9.  Prophylaxis.  I did a foot exam in 3/25.   We got a note from the eye doctor in 10/24 as above.   10.  He will return to clinic in 4 months.   This note is partially prepared by Earla Daria Messier, Scribe, in the presence of and acting as the scribe of Dr. Debby Breaker , MD.      [facility], MD

## 2024-02-29 ENCOUNTER — Ambulatory Visit: Payer: Medicare Other | Admitting: Physical Therapy

## 2024-02-29 ENCOUNTER — Ambulatory Visit: Payer: Medicare Other | Admitting: Speech Pathology

## 2024-03-05 ENCOUNTER — Ambulatory Visit: Payer: Medicare Other | Admitting: Speech Pathology

## 2024-03-05 ENCOUNTER — Ambulatory Visit: Payer: Medicare Other | Admitting: Physical Therapy

## 2024-03-07 ENCOUNTER — Ambulatory Visit: Payer: Medicare Other | Admitting: Speech Pathology

## 2024-03-07 ENCOUNTER — Ambulatory Visit: Payer: Medicare Other | Admitting: Physical Therapy

## 2024-03-19 ENCOUNTER — Other Ambulatory Visit: Payer: Self-pay | Admitting: Psychiatry

## 2024-03-24 NOTE — Progress Notes (Signed)
 Virtual Visit via Video Note  I connected with Edgar Huynh on 03/28/24 at  2:00 PM EDT by a video enabled telemedicine application and verified that I am speaking with the correct person using two identifiers.  Location: Patient: home Provider: home office Persons participated in the visit- patient, provider    I discussed the limitations of evaluation and management by telemedicine and the availability of in person appointments. The patient expressed understanding and agreed to proceed.  I discussed the assessment and treatment plan with the patient. The patient was provided an opportunity to ask questions and all were answered. The patient agreed with the plan and demonstrated an understanding of the instructions.   The patient was advised to call back or seek an in-person evaluation if the symptoms worsen or if the condition fails to improve as anticipated.   Katheren Sleet, MD    [facility] MD/PA/NP OP Progress Note  03/28/2024 4:42 PM Edgar Huynh  MRN:  [id number]  Chief Complaint:  Chief Complaint  Patient presents with   Follow-up   HPI:  - since the last visit, he was seen by Jacumin, Erika Leigh, PA, neurologist.  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  He states that he was seen by his primary care provider.  He was advised to start venlafaxine .  He has been sleeping all the time and he cannot do anything.  He was told by his wife that this has been going on since the loss of his mother about a year ago.  When he was asked given these were not reported previously, he states that he just did not know what to experience.  Although he did mention he went to pool a few months  ago, he has not done it lately.  He sleeps for 10- 11 hours and feels tired.  He feels sad as he is unable to get up and do things.  He continues to lose weight, which he attributes to wegovy, which was discontinued lately.  He denies SI, HI, hallucinations.  He also recognizes worsening in dysarthria.  He has an upcoming appointment with neurologist next month.  Of note, although he accidentally filled gabapentin , he has not taken this medication.     Blood Pressure 118/70 02/27/2024 3:55 PM EDT    Gabapentin  7/18 , accidentally fill it  68.3 kg (150 lb 9.6 oz) 03/27/2024 10:36 AM EDT   Wt Readings from Last 3 Encounters:  12/14/23 161 lb (73 kg)  10/21/23 175 lb (79.4 kg)  02/11/23 192 lb 3.9 oz (87.2 kg)     Visit Diagnosis:    ICD-10-CM   1. MDD (major depressive disorder), recurrent episode, mild (HCC)  F33.0     2. Panic disorder  F41.0     3. Insomnia, unspecified type  G47.00       Past Psychiatric History: Please see initial evaluation for full details. I have reviewed the history. No updates at this time.     Past Medical History:  Past Medical History:  Diagnosis Date   Anxiety    Chronic diastolic CHF (congestive heart failure) (HCC)    Chronic motor tic disorder    COPD (  chronic obstructive pulmonary disease) (HCC)    Depression    Diabetic polyneuropathy (HCC)    Diffuse cerebral atrophy (HCC)    Diplopia    Esophagitis, eosinophilic    GERD (gastroesophageal reflux disease)    Glaucoma    History of carotid stenosis    History of colonic polyps    History of kidney stones    Hypercholesteremia    Hyperlipidemia    Hypertension    Neuropathy    Osteoarthritis of left knee    Sleep apnea    Stroke (HCC) 12/10/1996   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   TIA (transient ischemic attack)    Type 1 diabetes (HCC)    on insulin  pump    Past Surgical History:  Procedure Laterality Date   CAROTID ARTERY ANGIOPLASTY Right  10/18/2004   CATARACT EXTRACTION, BILATERAL Bilateral    COLONOSCOPY     COLONOSCOPY WITH PROPOFOL  N/A 08/25/2018   Procedure: COLONOSCOPY WITH PROPOFOL ;  Surgeon: Viktoria Lamar DASEN, MD;  Location: [facility] ENDOSCOPY;  Service: Endoscopy;  Laterality: N/A;   CYSTOSCOPY     ESOPHAGOGASTRODUODENOSCOPY     ESOPHAGOGASTRODUODENOSCOPY N/A 09/01/2022   Procedure: ESOPHAGOGASTRODUODENOSCOPY (EGD);  Surgeon: Toledo, Ladell POUR, MD;  Location: [facility] ENDOSCOPY;  Service: Gastroenterology;  Laterality: N/A;   ESOPHAGOGASTRODUODENOSCOPY (EGD) WITH PROPOFOL  N/A 08/25/2018   Procedure: ESOPHAGOGASTRODUODENOSCOPY (EGD) WITH PROPOFOL ;  Surgeon: Viktoria Lamar DASEN, MD;  Location: [facility] ENDOSCOPY;  Service: Endoscopy;  Laterality: N/A;   EYE SURGERY     JOINT REPLACEMENT     PARTIAL KNEE ARTHROPLASTY Left 01/05/2022   Procedure: UNICOMPARTMENTAL KNEE;  Surgeon: Edie Norleen PARAS, MD;  Location: [facility] ORS;  Service: Orthopedics;  Laterality: Left;   WISDOM TOOTH EXTRACTION      Family Psychiatric History: Please see initial evaluation for full details. I have reviewed the history. No updates at this time.     Family History:  Family History  Problem Relation Age of Onset   Parkinson's disease Father    Diabetes Mellitus II Brother     Social History:  Social History   Socioeconomic History   Marital status: Married    Spouse name: Clarita   Number of children: 2   Years of education: Not on file   Highest education level: Some college, no degree  Occupational History   Not on file  Tobacco Use   Smoking status: Never   Smokeless tobacco: Never  Vaping Use   Vaping status: Never Used  Substance and Sexual Activity   Alcohol use: Not Currently    Comment: occasional   Drug use: Never   Sexual activity: Not on file    Comment: not asked if sexually active  Other Topics Concern   Not on file  Social History Narrative   Not on file   Social Drivers of Health   Financial Resource Strain: Medium Risk  (09/12/2023)   Received from [facility]   Overall Financial Resource Strain (CARDIA)    Difficulty of Paying Living Expenses: Somewhat hard  Food Insecurity: No Food Insecurity (09/12/2023)   Received from [facility]   Hunger Vital Sign    Within the past 12 months, you worried that your food would run out before you got the money to buy more.: Never true    Within the past 12 months, the food you bought just didn't last and you didn't have money to get more.: Never true  Transportation Needs: No Transportation Needs (09/12/2023)  Received from [facility] - Transportation    In the past 12 months, has lack of transportation kept you from medical appointments or from getting medications?: No    Lack of Transportation (Non-Medical): No  Physical Activity: Not on file  Stress: Not on file  Social Connections: Not on file    Allergies:  Allergies  Allergen Reactions   Other Rash    Clonidine 0.3 mg patch (adhesive caused the irritation)    Metabolic Disorder Labs: Lab Results  Component Value Date   HGBA1C 7.2 (H) 04/09/2021   MPG 159.94 04/09/2021   MPG 151 03/02/2017   Lab Results  Component Value Date   PROLACTIN 25.6 (H) 03/02/2017   Lab Results  Component Value Date   CHOL 151 04/10/2021   TRIG 134 04/10/2021   HDL 46 04/10/2021   CHOLHDL 3.3 04/10/2021   VLDL 27 04/10/2021   LDLCALC 78 04/10/2021   LDLCALC 119 (H) 03/02/2017   Lab Results  Component Value Date   TSH 1.582 11/09/2022   TSH 0.937 04/10/2021    Therapeutic Level Labs: No results found for: LITHIUM No results found for: VALPROATE No results found for: CBMZ  Current Medications: Current Outpatient Medications  Medication Sig Dispense Refill   [START ON 04/11/2024] venlafaxine  XR (EFFEXOR -XR) 150 MG 24 hr capsule Take 1 capsule (150 mg total) by mouth daily with breakfast. 30 capsule 1   albuterol  (VENTOLIN  HFA) 108 (90  Base) MCG/ACT inhaler Inhale 2 puffs into the lungs every 8 (eight) hours as needed.     Alpha-Lipoic Acid 200 MG CAPS Take 200 mg by mouth daily.     amLODipine  (NORVASC ) 5 MG tablet Take 5 mg by mouth every evening.     atorvastatin  (LIPITOR) 20 MG tablet Take 20 mg by mouth every evening.      budesonide-formoterol (SYMBICORT) 80-4.5 MCG/ACT inhaler Inhale 2 puffs into the lungs in the morning and at bedtime.     busPIRone  (BUSPAR ) 30 MG tablet Take 1 tablet (30 mg total) by mouth 2 (two) times daily. 60 tablet 3   CALCIUM  PO Take 1,200 mg by mouth daily.     CLINPRO 5000 1.1 % PSTE SMARTSIG:Sparingly To Teeth Daily     clonazePAM  (KLONOPIN ) 0.5 MG tablet Take 0.5 mg by mouth 2 (two) times daily as needed for anxiety.     clopidogrel  (PLAVIX ) 75 MG tablet Take 75 mg by mouth daily.     Coenzyme Q10 (COQ-10) 100 MG CAPS Take 100 mg by mouth daily.     escitalopram  (LEXAPRO ) 20 MG tablet Take 1 tablet (20 mg total) by mouth daily. 30 tablet 3   furosemide  (LASIX ) 20 MG tablet Take 20 mg by mouth daily.     gabapentin  (NEURONTIN ) 100 MG capsule Take 300 mg by mouth 3 (three) times daily.     glucose blood (CONTOUR NEXT TEST) test strip TEST BLOOD SUGAR 7 TIMES A DAY AS DIRECTED     HUMALOG 100 UNIT/ML injection Via Insulin  Pump     hydrocortisone  2.5 % cream Apply 1 Application topically See admin instructions. Apply on Monday Wednesday and Friday to affected areas of face 30 g 11   ketoconazole  (NIZORAL ) 2 % cream Apply 1 Application topically See admin instructions. Apply to face Tues Thurs Sat 30 g 11   latanoprost  (XALATAN ) 0.005 % ophthalmic solution Place 1 drop into both eyes at bedtime.     Multiple Vitamin (MULTIVITAMIN WITH MINERALS) TABS tablet Take  1 tablet by mouth daily.     Potassium 99 MG TABS Take 2 tablets by mouth daily.     traZODone  (DESYREL ) 100 MG tablet Take 1 tablet (100 mg total) by mouth at bedtime. 30 tablet 0   valbenazine (INGREZZA) 80 MG capsule Take 60 mg by  mouth daily.     No current facility-administered medications for this visit.     Musculoskeletal: Strength & Muscle Tone: N/A Gait & Station: N/A Patient leans: N/A  Psychiatric Specialty Exam: Review of Systems  Psychiatric/Behavioral:  Positive for dysphoric mood and sleep disturbance. Negative for agitation, behavioral problems, confusion, decreased concentration, hallucinations, self-injury and suicidal ideas. The patient is not nervous/anxious and is not hyperactive.     There were no vitals taken for this visit.There is no height or weight on file to calculate BMI.  General Appearance: Well Groomed  Eye Contact:  Good  Speech:  Slurred  Volume:  Normal  Mood:  Depressed  Affect:  Appropriate, Congruent, and slightly restricted  Thought Process:  Coherent  Orientation:  Full (Time, Place, and Person)  Thought Content: Logical   Suicidal Thoughts:  No  Homicidal Thoughts:  No  Memory:  Immediate;   Good  Judgement:  Good  Insight:  Good  Psychomotor Activity:  Normal  Concentration:  Concentration: Good and Attention Span: Good  Recall:  Good  Fund of Knowledge: Good  Language: Good  Akathisia:  No  Handed:  Right  AIMS (if indicated): not done  Assets:  Communication Skills Desire for Improvement  ADL's:  Intact  Cognition: WNL  Sleep:  hypersomnia   Screenings: AIMS    Flowsheet Row [facility]  AIMS Total Score 0   AUDIT    Flowsheet Row [facility]  Alcohol Use Disorder Identification Test Final Score (AUDIT) 0   GAD-7    Flowsheet Row Office Visit from 11/09/2022 in [facility]  Total GAD-7 Score 9   PHQ2-9    Flowsheet Row Office Visit from 11/09/2022 in [facility] Nutrition from 08/26/2021 in [facility] Total Score 2 0  PHQ-9 Total Score 6 --   Flowsheet Row ED from 12/14/2023 in [facility] ED from 10/21/2023 in [facility] Emergency Department at [facility] ED to Hosp-Admission (Discharged) from 02/11/2023 in [facility]  C-SSRS RISK CATEGORY No Risk No Risk No Risk     Assessment and Plan:  Edgar Huynh is a 68 y.o. year old male with a history of depression, anxiety, Complex motor tics, type I diabetes, history of right pontine stroke,  TBI, hypertension, hyperlipidemia,   OSA on CPAP, OA, eosinophilic esophagitis, who presents for follow up appointment for below.   1. MDD (major depressive disorder), recurrent episode, mild (HCC) 2. Panic disorder The patient is currently undergoing evaluation for involuntary movements. He lost his mother in 2024, and is actively providing care for his wife.  History: anxiety for many years. 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   He had PCP visit yesterday and was recommended to switch from Lexapro  to venlafaxine , although he has not made this change yet.  According to the chart review and based on his report/collateral from his wife, he experiences worsening in  depressive symptoms with hypersomnia since loss of his mother last year.  Although he denies any of these symptoms until the appointment today, he now states that he may have been experiencing this although he did not recognize it. While there is concern of his neurological condition contributing to his symptoms, we will proceed with adjustment of his medication given he also reports sadness along with his other physical symptoms.  Will do cross-taper from Lexapro  to venlafaxine  to see if he has more benefit from this medication.  Discussed potential risk of serotonin syndrome.  He agrees to contact the office if any worsening.    3. Insomnia, unspecified type - on CPAP machine     He reports hypersomnolence, he also reports benefit from trazodone .  Will continue current dose at this time to target insomnia, although he was advised to use this cautiously to avoid drowsiness.    # r/o polypharmacy  He is on clonazepam , and trazodone , and all of these medication can contribute to drowsiness during the day.  He was advised again to discuss with his primary care to hopefully taper down clonazepam .     # benzodiazepine dependence - on clonazepam  0.5 mg BID since 20's Unchanged. The medication is prescribed by another provider. He is advised this medication to be tapered off in the future to avoid long-term risks.   # weight loss He has had significant weight loss, which he attributed to [facility].  This medication was discontinued yesterday.  Will continue to address this issues.    # dysarthria The exam is notable for significantly worsening and dysarthria .  He has an upcoming appointment with neurologist.  We will defer further management.    Plan Decrease lexapro  10 mg for two weeks, then discontinue Start venlafaxine  75 mg daily for two weeks, then 150 mg daily (his PCP sent in 75 mg daily) Continue buspirone  30 mg twice a day  Continue Trazodone  25-50 mg at night as needed for insomnia Next appointment: 10/3 at 9 30, video. He agrees that his wife will be present at the next visit to obtain collaterals - on ingrezza 60 mg daily for slurred speech - on clonazepam  0.5 mg twice a day  - on GOLO, OTC   The patient demonstrates the following risk factors for suicide: Chronic risk factors for suicide include: psychiatric disorder of depression, anxiety . Acute risk factors for suicide include: loss (financial, interpersonal, professional). Protective factors for this patient include: coping skills and hope for the future. Considering these factors, the overall suicide risk at this point appears to be low. Patient is  appropriate for outpatient follow up.     Collaboration of Care: Collaboration of Care: Other reviewed notes in Epic  Patient/Guardian was advised Release of Information must be obtained prior to any record release in order to collaborate their care with an outside provider. Patient/Guardian was advised if they have not already done so to contact the registration department to sign all necessary forms in order for us  to release information regarding their care.   Consent: Patient/Guardian gives verbal consent for treatment and assignment of benefits for services provided during this visit. Patient/Guardian expressed understanding and agreed to proceed.    Katheren Sleet, MD 03/28/2024, 4:43 PM

## 2024-03-27 ENCOUNTER — Other Ambulatory Visit: Payer: Self-pay | Admitting: Psychiatry

## 2024-03-27 MED ORDER — TRAZODONE HCL 100 MG PO TABS: 100.0000 mg | ORAL_TABLET | Freq: Every day | ORAL | 0 refills | Status: DC

## 2024-03-28 ENCOUNTER — Encounter: Payer: Self-pay | Admitting: Psychiatry

## 2024-03-28 ENCOUNTER — Telehealth: Admitting: Psychiatry

## 2024-03-28 DIAGNOSIS — G4700 Insomnia, unspecified: Secondary | ICD-10-CM | POA: Diagnosis not present

## 2024-03-28 DIAGNOSIS — F410 Panic disorder [episodic paroxysmal anxiety] without agoraphobia: Secondary | ICD-10-CM

## 2024-03-28 DIAGNOSIS — F330 Major depressive disorder, recurrent, mild: Secondary | ICD-10-CM | POA: Diagnosis not present

## 2024-03-28 MED ORDER — VENLAFAXINE HCL ER 150 MG PO CP24: 150.0000 mg | ORAL_CAPSULE | Freq: Every day | ORAL | 1 refills | Status: DC

## 2024-03-28 NOTE — Patient Instructions (Signed)
 Decrease lexapro  10 mg for two weeks, then discontinue Start venlafaxine  75 mg daily for two weeks, then 150 mg daily Continue buspirone  30 mg twice a day  Continue Trazodone  25-50 mg at night as needed for insomnia Next appointment: 10/3 at 9 30

## 2024-04-24 ENCOUNTER — Other Ambulatory Visit: Payer: Self-pay

## 2024-04-24 ENCOUNTER — Emergency Department
Admission: EM | Admit: 2024-04-24 | Discharge: 2024-04-24 | Disposition: A | Discharge status: Home / Self Care | Attending: Emergency Medicine | Admitting: Emergency Medicine

## 2024-04-24 ENCOUNTER — Emergency Department

## 2024-04-24 DIAGNOSIS — I509 Heart failure, unspecified: Secondary | ICD-10-CM | POA: Diagnosis not present

## 2024-04-24 DIAGNOSIS — R1084 Generalized abdominal pain: Secondary | ICD-10-CM | POA: Diagnosis present

## 2024-04-24 DIAGNOSIS — E119 Type 2 diabetes mellitus without complications: Secondary | ICD-10-CM | POA: Insufficient documentation

## 2024-04-24 DIAGNOSIS — D72829 Elevated white blood cell count, unspecified: Secondary | ICD-10-CM | POA: Diagnosis not present

## 2024-04-24 DIAGNOSIS — K5900 Constipation, unspecified: Secondary | ICD-10-CM | POA: Diagnosis not present

## 2024-04-24 DIAGNOSIS — J449 Chronic obstructive pulmonary disease, unspecified: Secondary | ICD-10-CM | POA: Diagnosis not present

## 2024-04-24 LAB — COMPREHENSIVE METABOLIC PANEL WITH GFR
ALT: 11 U/L (ref 0–44)
AST: 25 U/L (ref 15–41)
Albumin: 4.5 g/dL (ref 3.5–5.0)
Alkaline Phosphatase: 97 U/L (ref 38–126)
Anion gap: 13 (ref 5–15)
BUN: 14 mg/dL (ref 8–23)
CO2: 26 mmol/L (ref 22–32)
Calcium: 9.3 mg/dL (ref 8.9–10.3)
Chloride: 100 mmol/L (ref 98–111)
Creatinine, Ser: 0.97 mg/dL (ref 0.61–1.24)
GFR, Estimated: >60 mL/min (ref >60)
Glucose, Bld: 251 mg/dL — ABNORMAL HIGH (ref 70–99)
Potassium: 3.6 mmol/L (ref 3.5–5.1)
Sodium: 139 mmol/L (ref 135–145)
Total Bilirubin: 0.7 mg/dL (ref 0.0–1.2)
Total Protein: 7.2 g/dL (ref 6.5–8.1)

## 2024-04-24 LAB — CBC
HCT: 46.7 % (ref 39.0–52.0)
Hemoglobin: 15.8 g/dL (ref 13.0–17.0)
MCH: 30.7 pg (ref 26.0–34.0)
MCHC: 33.8 g/dL (ref 30.0–36.0)
MCV: 90.9 fL (ref 80.0–100.0)
Platelets: 271 K/uL (ref 150–400)
RBC: 5.14 MIL/uL (ref 4.22–5.81)
RDW: 13.2 % (ref 11.5–15.5)
WBC: 15.7 K/uL — ABNORMAL HIGH (ref 4.0–10.5)
nRBC: 0 % (ref 0.0–0.2)

## 2024-04-24 LAB — TROPONIN I (HIGH SENSITIVITY): Troponin I (High Sensitivity): 5 ng/L (ref <18)

## 2024-04-24 LAB — LIPASE, BLOOD: Lipase: 26 U/L (ref 11–51)

## 2024-04-24 MED ORDER — FLEET ENEMA RE ENEM
1.0000 | ENEMA | Freq: Once | RECTAL | Status: AC
  Administered 2024-04-24: 1 via RECTAL

## 2024-04-24 MED ORDER — FLEET ENEMA RE ENEM
1.0000 | ENEMA | Freq: Every day | RECTAL | 0 refills | Status: AC | PRN
Start: 2024-04-24 — End: 2024-04-29

## 2024-04-24 MED ORDER — BENEFIBER DRINK MIX PO PACK: 1.0000 | PACK | Freq: Every day | ORAL | 0 refills | Status: AC

## 2024-04-24 MED ORDER — IOHEXOL 300 MG/ML  SOLN
100.0000 mL | Freq: Once | INTRAMUSCULAR | Status: AC | PRN
  Administered 2024-04-24: 100 mL via INTRAVENOUS

## 2024-04-24 MED ORDER — SODIUM CHLORIDE 0.9 % IV BOLUS
1000.0000 mL | Freq: Once | INTRAVENOUS | Status: AC
  Administered 2024-04-24: 1000 mL via INTRAVENOUS

## 2024-04-24 MED ORDER — POLYETHYLENE GLYCOL 3350 17 G PO PACK: 17.0000 g | PACK | Freq: Every day | ORAL | 0 refills | Status: AC

## 2024-04-24 NOTE — ED Triage Notes (Signed)
 Pt to ED for constipation since 1 week. LBM 1 week ago. HR is 129, getting EKG. Pt states it feels like a long turd that has nowhere to go in lower abdomen.

## 2024-04-24 NOTE — Discharge Instructions (Signed)
 To keep her self hydrated, he can take MiraLAX  1 packet the day, also take the Benefiber once a day.  Please make sure to follow-up with your primary care doctor to get reassessed to make sure that you are stooling appropriately.  You can also try prune juice.

## 2024-04-24 NOTE — ED Provider Notes (Signed)
 [facility] Provider Note    Event Date/Time   First MD Initiated Contact with Patient 04/24/24 1332     (approximate)   History   Constipation   HPI  Edgar Huynh is a 68 y.o. male with prior history of CHF, COPD, diabetes, MDD, presenting with constipation and abdominal pain.  Patient has been constipated for the past week, has been trying MiraLAX , just took Dulcolax today.  States that abdominal pain started today.  States that he is not been passing gas.  Last voided this morning.  Denies any nausea vomiting or diarrhea, no hematuria, or dysuria, no chest pain or shortness of breath, no fever.  Per independent history from wife, he has been taking an over-the-counter stool softener, has not tried Benefiber or enemas, she given the Dulcolax a day.     Physical Exam   Triage Vital Signs: ED Triage Vitals  Encounter Vitals Group     BP 04/24/24 1253 (!) 142/75     Girls Systolic BP Percentile --      Girls Diastolic BP Percentile --      Boys Systolic BP Percentile --      Boys Diastolic BP Percentile --      Pulse Rate 04/24/24 1253 (!) 129     Resp 04/24/24 1253 20     Temp 04/24/24 1253 98.6 F (37 C)     Temp Source 04/24/24 1253 Oral     SpO2 04/24/24 1253 97 %     Weight 04/24/24 1252 150 lb (68 kg)     Height 04/24/24 1252 5' 8 (1.727 m)     Head Circumference --      Peak Flow --      Pain Score 04/24/24 1249 5     Pain Loc --      Pain Education --      Exclude from Growth Chart --     Most recent vital signs: Vitals:   04/24/24 1400 04/24/24 1430  BP: (!) 141/84 (!) 141/80  Pulse: (!) 104 100  Resp: 16 17  Temp:    SpO2: 98% 98%     General: Awake, no distress.  CV:  Good peripheral perfusion.  Resp:  Normal effort.  Abd:  No distention.  Soft, nontender Other:  Nontoxic-appearing   ED Results / Procedures / Treatments   Labs (all labs ordered are listed, but only abnormal results are displayed) Labs  Reviewed  COMPREHENSIVE METABOLIC PANEL WITH GFR - Abnormal; Notable for the following components:      Result Value   Glucose, Bld 251 (*)    All other components within normal limits  CBC - Abnormal; Notable for the following components:   WBC 15.7 (*)    All other components within normal limits  LIPASE, BLOOD  TROPONIN I (HIGH SENSITIVITY)  TROPONIN I (HIGH SENSITIVITY)     EKG  EKG shows, sinus tachycardia, rate 124, normal QRS, normal QTc, no obvious ischemic ST elevation, T wave inversion to 3, aVF, T wave changes new compared to prior   RADIOLOGY On my independent interpretation, CT without obvious obstruction   PROCEDURES:  Critical Care performed: No  Procedures   MEDICATIONS ORDERED IN ED: Medications  sodium phosphate  (FLEET) enema 1 enema (has no administration in time range)  sodium chloride  0.9 % bolus 1,000 mL (1,000 mLs Intravenous New Bag/Given 04/24/24 1343)  iohexol  (OMNIPAQUE ) 300 MG/ML solution 100 mL (100 mLs Intravenous Contrast Given 04/24/24 1441)  IMPRESSION / MDM / ASSESSMENT AND PLAN / ED COURSE  I reviewed the triage vital signs and the nursing notes.                              Differential diagnosis includes, but is not limited to, constipation, colitis, bowel obstruction, mass, electrolyte derangement, dehydration.  Get labs, fluids, CT abdomen pelvis.  He was tachycardic in the waiting room, got an EKG, add on troponin.  Patient's presentation is most consistent with acute presentation with potential threat to life or bodily function.  Independent interpretation of labs and imaging below.  On reassessment patient states he is feeling good, discussed with him about imaging and lab results, will prescribe him MiraLAX , Benefiber, Fleet enemas.  Given enema here.  Will plan to have him follow-up outpatient with his doctor to get reassessed and make sure that he is stooling appropriately.  Encouraged hydration, he can also take prune juice.   Otherwise considered but no indication for inpatient mission at this time, he safe for outpatient management.  Will discharge with strict return precautions.  Shared decision making done with patient and wife and they are agreeable with this plan.    Clinical Course as of 04/24/24 1552  Tue Apr 24, 2024  1435 Independent review of labs, mild leukocytosis, lipase is normal, electrolytes not severely deranged, LFTs are normal.  Troponin elevated. [TT]  1540 CT ABDOMEN PELVIS W CONTRAST IMPRESSION: 1. Severe fecal retention throughout the colon compatible with constipation. No bowel obstruction or ileus. 2. Bilateral nonobstructing renal calculi. 3. Cholelithiasis without cholecystitis. 4.  Aortic Atherosclerosis (ICD10-I70.0).   [TT]    Clinical Course User Index [TT] Waymond Lorelle Cummins, MD     FINAL CLINICAL IMPRESSION(S) / ED DIAGNOSES   Final diagnoses:  Constipation, unspecified constipation type  Generalized abdominal pain     Rx / DC Orders   ED Discharge Orders          Ordered    polyethylene glycol (MIRALAX ) 17 g packet  Daily        04/24/24 1546    Wheat Dextrin (BENEFIBER DRINK MIX) PACK  Daily        04/24/24 1546    sodium phosphate  (FLEET) ENEM  Daily PRN        04/24/24 1546             Note:  This document was prepared using Dragon voice recognition software and may include unintentional dictation errors.    Waymond Lorelle Cummins, MD 04/24/24 [phone]

## 2024-04-25 ENCOUNTER — Other Ambulatory Visit: Payer: Self-pay | Admitting: Psychiatry

## 2024-04-27 NOTE — Telephone Encounter (Signed)
 He should have a refill of trazodone  at the pharmacy. Could you verify with the pharmacy and update him? thanks

## 2024-05-13 NOTE — Progress Notes (Unsigned)
 Virtual Visit via Video Note  I connected with Ryann Leavitt Krehbiel on 05/18/24 at  9:30 AM EDT by a video enabled telemedicine application and verified that I am speaking with the correct person using two identifiers.  Location: Patient: home Provider: home office Persons participated in the visit- patient, provider    I discussed the limitations of evaluation and management by telemedicine and the availability of in person appointments. The patient expressed understanding and agreed to proceed.   I discussed the assessment and treatment plan with the patient. The patient was provided an opportunity to ask questions and all were answered. The patient agreed with the plan and demonstrated an understanding of the instructions.   The patient was advised to call back or seek an in-person evaluation if the symptoms worsen or if the condition fails to improve as anticipated.   Katheren Sleet, MD    [facility] MD/PA/NP OP Progress Note  05/18/2024 9:48 AM GREGOREY NABOR  MRN:  [id number]  Chief Complaint:  Chief Complaint  Patient presents with   Follow-up   HPI:  - since the last visit, he was seen by Dr. Maree 3. Drug-induced parkinsonism Symptoms of bradykinesia, rigidity, and impaired movement. Trial of carbidopa-levodopa did not improve symptoms significantly. Reduction of valbenazine did not worsen tics. 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 He prefers to avoid travel to [facility] for management, but specialists are recommended for stabilization.  - Continue carbidopa-levodopa trial - Consider increasing carbidopa-levodopa dose in the future if symptoms do not stabilize - Follow up with specialists for further management   This is a follow-up appointment for depression, panic disorder and insomnia.  He states that his wife is in the bed, and will not be able to be here for collateral.  He states that he has been doing better.  He is able to get up in the morning.  He does not feel depressed as  he used to.  He enjoys for declaration.  He is trying to work on this with his wife.  His kids have been doing well.  He has 50th reunion from high school.  He is looking forward to this.  He sleeps well with trazodone .  He denies anxiety or panic attacks.  He denies SI, HI, hallucinations.  He reports that he does not experience any tremors anymore.  He agrees with the plans as outlined below.   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  He states that although they were not rich, his parents provided enough to him.    155 lbs Wt Readings from Last 3 Encounters:  04/24/24 150 lb (68 kg)  12/14/23 161 lb (73 kg)  10/21/23 175 lb (79.4 kg)      Visit Diagnosis:    ICD-10-CM   1. MDD (major depressive disorder), recurrent, in partial remission  F33.41     2. Panic disorder  F41.0     3. Insomnia, unspecified type  G47.00       Past Psychiatric History: Please see initial evaluation for full details. I have reviewed the history. No updates at this time.     Past Medical History:  Past Medical History:  Diagnosis Date   Anxiety    Chronic diastolic CHF (congestive heart failure) (HCC)    Chronic motor tic disorder    COPD (chronic obstructive pulmonary disease) (  HCC)    Depression    Diabetic polyneuropathy (HCC)    Diffuse cerebral atrophy    Diplopia    Esophagitis, eosinophilic    GERD (gastroesophageal reflux disease)    Glaucoma    History of carotid stenosis    History of colonic polyps    History of kidney stones    Hypercholesteremia    Hyperlipidemia    Hypertension    Neuropathy    Osteoarthritis of left knee    Sleep apnea    Stroke (HCC) 12/10/1996   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   TIA (transient ischemic attack)    Type 1 diabetes (HCC)    on  insulin  pump    Past Surgical History:  Procedure Laterality Date   CAROTID ARTERY ANGIOPLASTY Right 10/18/2004   CATARACT EXTRACTION, BILATERAL Bilateral    COLONOSCOPY     COLONOSCOPY WITH PROPOFOL  N/A 08/25/2018   Procedure: COLONOSCOPY WITH PROPOFOL ;  Surgeon: Viktoria Lamar DASEN, MD;  Location: [facility] ENDOSCOPY;  Service: Endoscopy;  Laterality: N/A;   CYSTOSCOPY     ESOPHAGOGASTRODUODENOSCOPY     ESOPHAGOGASTRODUODENOSCOPY N/A 09/01/2022   Procedure: ESOPHAGOGASTRODUODENOSCOPY (EGD);  Surgeon: Toledo, Ladell POUR, MD;  Location: [facility] ENDOSCOPY;  Service: Gastroenterology;  Laterality: N/A;   ESOPHAGOGASTRODUODENOSCOPY (EGD) WITH PROPOFOL  N/A 08/25/2018   Procedure: ESOPHAGOGASTRODUODENOSCOPY (EGD) WITH PROPOFOL ;  Surgeon: Viktoria Lamar DASEN, MD;  Location: [facility] ENDOSCOPY;  Service: Endoscopy;  Laterality: N/A;   EYE SURGERY     JOINT REPLACEMENT     PARTIAL KNEE ARTHROPLASTY Left 01/05/2022   Procedure: UNICOMPARTMENTAL KNEE;  Surgeon: Edie Norleen PARAS, MD;  Location: [facility] ORS;  Service: Orthopedics;  Laterality: Left;   WISDOM TOOTH EXTRACTION      Family Psychiatric History: Please see initial evaluation for full details. I have reviewed the history. No updates at this time.     Family History:  Family History  Problem Relation Age of Onset   Parkinson's disease Father    Diabetes Mellitus II Brother     Social History:  Social History   Socioeconomic History   Marital status: Married    Spouse name: Clarita   Number of children: 2   Years of education: Not on file   Highest education level: Some college, no degree  Occupational History   Not on file  Tobacco Use   Smoking status: Never   Smokeless tobacco: Never  Vaping Use   Vaping status: Never Used  Substance and Sexual Activity   Alcohol use: Not Currently    Comment: occasional   Drug use: Never   Sexual activity: Not on file    Comment: not asked if sexually active  Other Topics Concern   Not on file  Social  History Narrative   Not on file   Social Drivers of Health   Financial Resource Strain: Medium Risk (09/12/2023)   Received from [facility]   Overall Financial Resource Strain (CARDIA)    Difficulty of Paying Living Expenses: Somewhat hard  Food Insecurity: No Food Insecurity (09/12/2023)   Received from [facility]   Hunger Vital Sign    Within the past 12 months, you worried that your food would run out before you got the money to buy more.: Never true    Within the past 12 months, the food you bought just didn't last and you didn't have money to get more.: Never true  Transportation Needs: No Transportation Needs (09/12/2023)   Received from [facility]   PRAPARE - Transportation    In the past 12 months, has lack of transportation kept you from medical appointments or from getting medications?: No    Lack of Transportation (Non-Medical): No  Physical Activity: Not on file  Stress: Not on file  Social Connections: Not on file    Allergies:  Allergies  Allergen Reactions   Other Rash    Clonidine 0.3 mg patch (adhesive caused the irritation)    Metabolic Disorder Labs: Lab Results  Component Value Date   HGBA1C 7.2 (H) 04/09/2021   MPG 159.94 04/09/2021   MPG 151 03/02/2017   Lab Results  Component Value Date   PROLACTIN 25.6 (H) 03/02/2017   Lab Results  Component Value Date   CHOL 151 04/10/2021   TRIG 134 04/10/2021   HDL 46 04/10/2021   CHOLHDL 3.3 04/10/2021   VLDL 27 04/10/2021   LDLCALC 78 04/10/2021   LDLCALC 119 (H) 03/02/2017   Lab Results  Component Value Date   TSH 1.582 11/09/2022   TSH 0.937 04/10/2021    Therapeutic Level Labs: No results found for: LITHIUM No results found for: VALPROATE No results found for: CBMZ  Current Medications: Current Outpatient Medications  Medication Sig Dispense Refill   albuterol  (VENTOLIN  HFA) 108 (90 Base) MCG/ACT inhaler Inhale 2 puffs into the  lungs every 8 (eight) hours as needed.     Alpha-Lipoic Acid 200 MG CAPS Take 200 mg by mouth daily.     amLODipine  (NORVASC ) 5 MG tablet Take 5 mg by mouth every evening.     atorvastatin  (LIPITOR) 20 MG tablet Take 20 mg by mouth every evening.      budesonide-formoterol (SYMBICORT) 80-4.5 MCG/ACT inhaler Inhale 2 puffs into the lungs in the morning and at bedtime.     CALCIUM  PO Take 1,200 mg by mouth daily.     CLINPRO 5000 1.1 % PSTE SMARTSIG:Sparingly To Teeth Daily     clonazePAM  (KLONOPIN ) 0.5 MG tablet Take 0.5 mg by mouth 2 (two) times daily as needed for anxiety.     clopidogrel  (PLAVIX ) 75 MG tablet Take 75 mg by mouth daily.     Coenzyme Q10 (COQ-10) 100 MG CAPS Take 100 mg by mouth daily.     furosemide  (LASIX ) 20 MG tablet Take 20 mg by mouth daily.     gabapentin  (NEURONTIN ) 100 MG capsule Take 300 mg by mouth 3 (three) times daily.     glucose blood (CONTOUR NEXT TEST) test strip TEST BLOOD SUGAR 7 TIMES A DAY AS DIRECTED     HUMALOG 100 UNIT/ML injection Via Insulin  Pump     hydrocortisone  2.5 % cream Apply 1 Application topically See admin instructions. Apply on Monday Wednesday and Friday to affected areas of face 30 g 11   ketoconazole  (NIZORAL ) 2 % cream Apply 1 Application topically See admin instructions. Apply to face Tues Thurs Sat 30 g 11   latanoprost  (XALATAN ) 0.005 % ophthalmic solution Place 1 drop into both eyes at bedtime.     Multiple Vitamin (MULTIVITAMIN WITH MINERALS) TABS tablet Take 1 tablet by mouth daily.     polyethylene glycol (MIRALAX ) 17 g packet Take 17 g by mouth daily. 30 packet 0   Potassium 99 MG TABS Take 2 tablets by mouth daily.     traZODone  (DESYREL ) 100 MG tablet Take 1 tablet (100 mg total) by mouth at bedtime. 30 tablet 0   valbenazine (INGREZZA) 80 MG capsule Take 60 mg by mouth daily.  venlafaxine  XR (EFFEXOR -XR) 150 MG 24 hr capsule Take 1 capsule (150 mg total) by mouth daily with breakfast. 30 capsule 1   Wheat Dextrin  (BENEFIBER DRINK MIX) PACK Take 1 packet by mouth daily. 30 each 0   No current facility-administered medications for this visit.     Musculoskeletal: Strength & Muscle Tone: N/A Gait & Station: N/A Patient leans: N/A  Psychiatric Specialty Exam: Review of Systems  Psychiatric/Behavioral:  Negative for agitation, behavioral problems, confusion, decreased concentration, dysphoric mood, hallucinations, self-injury, sleep disturbance and suicidal ideas. The patient is not nervous/anxious and is not hyperactive.   All other systems reviewed and are negative.   There were no vitals taken for this visit.There is no height or weight on file to calculate BMI.  General Appearance: Well Groomed  Eye Contact:  Good  Speech:  Slurred  Volume:  Normal  Mood:  good  Affect:  Appropriate, Congruent, and brighter  Thought Process:  Coherent  Orientation:  Full (Time, Place, and Person)  Thought Content: Logical   Suicidal Thoughts:  No  Homicidal Thoughts:  No  Memory:  Immediate;   Good  Judgement:  Good  Insight:  Good  Psychomotor Activity:  Normal  Concentration:  Concentration: Good and Attention Span: Good  Recall:  Good  Fund of Knowledge: Good  Language: Good  Akathisia:  No  Handed:  Right  AIMS (if indicated): not done  Assets:  Communication Skills Desire for Improvement  ADL's:  Intact  Cognition: WNL  Sleep:  Good   Screenings: AIMS    Flowsheet Row [facility]  AIMS Total Score 0   AUDIT    Flowsheet Row [facility]  Alcohol Use Disorder Identification Test Final Score (AUDIT) 0   GAD-7    Flowsheet Row Office Visit from 11/09/2022 in [facility]  Total GAD-7 Score 9   PHQ2-9    Flowsheet Row Office Visit from 11/09/2022 in [facility] Nutrition from 08/26/2021 in [facility] Total Score 2 0  PHQ-9 Total Score 6 --   Flowsheet Row ED from 04/24/2024 in [facility] at [facility] ED from 12/14/2023 in [facility] Department at [facility] ED from 10/21/2023 in [facility] Emergency Department at [facility]  C-SSRS RISK CATEGORY No Risk No Risk No Risk     Assessment and Plan:  BRITTNEY CARAWAY is a 68 y.o. year old male with a history of depression, anxiety, Complex motor tics, type I diabetes, history of right pontine stroke,  TBI, hypertension, hyperlipidemia,   OSA on CPAP, OA, eosinophilic esophagitis, who presents for follow up appointment for below.   1. MDD (major depressive disorder), recurrent, in partial remission 2. Panic disorder The patient is currently undergoing evaluation for involuntary movements. He lost his mother in 2024, and is actively providing care for his wife.  History: anxiety for many years. 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   The exam is notable for brighter affect.  He reports improvement in his mood symptoms since uptitration of venlafaxine , after cross tapering from Lexapro .  Will continue current dose of venlafaxine  to target depression and panic disorder.  Will continue BuSpar  for anxiety.   3. Insomnia, unspecified type - on CPAP machine   Overall  stable.  He denies hypersomnolence since being on the venlafaxine .  Will continue current dose of trazodone  as needed for insomnia.     # benzodiazepine dependence - on clonazepam  0.5 mg BID since 20's Unchanged. The medication is prescribed by another provider. He is advised this medication to be tapered off in the future to avoid long-term risks.   # weight loss Improving since discontinuation of Wegovy.  Will continue to assess and intervene as needed.   Plan Continue venlafaxine   150 mg daily (his PCP sent in 75 mg daily) Continue buspirone  30 mg twice a day  Continue Trazodone  25-50 mg at night as needed for insomnia Next appointment: 12/12 at 9 am, video.   - on ingrezza 60 mg daily for slurred speech - on clonazepam  0.5 mg twice a day  - on GOLO, OTC   The patient demonstrates the following risk factors for suicide: Chronic risk factors for suicide include: psychiatric disorder of depression, anxiety . Acute risk factors for suicide include: loss (financial, interpersonal, professional). Protective factors for this patient include: coping skills and hope for the future. Considering these factors, the overall suicide risk at this point appears to be low. Patient is appropriate for outpatient follow up.       Collaboration of Care: Collaboration of Care: Other reviewed notes in Epic  Patient/Guardian was advised Release of Information must be obtained prior to any record release in order to collaborate their care with an outside provider. Patient/Guardian was advised if they have not already done so to contact the registration department to sign all necessary forms in order for us  to release information regarding their care.   Consent: Patient/Guardian gives verbal consent for treatment and assignment of benefits for services provided during this visit. Patient/Guardian expressed understanding and agreed to proceed.    Katheren Sleet, MD 05/18/2024, 9:48 AM

## 2024-05-18 ENCOUNTER — Telehealth (INDEPENDENT_AMBULATORY_CARE_PROVIDER_SITE_OTHER): Admitting: Psychiatry

## 2024-05-18 ENCOUNTER — Encounter: Payer: Self-pay | Admitting: Psychiatry

## 2024-05-18 DIAGNOSIS — G4700 Insomnia, unspecified: Secondary | ICD-10-CM

## 2024-05-18 DIAGNOSIS — F410 Panic disorder [episodic paroxysmal anxiety] without agoraphobia: Secondary | ICD-10-CM

## 2024-05-18 DIAGNOSIS — F3341 Major depressive disorder, recurrent, in partial remission: Secondary | ICD-10-CM

## 2024-05-18 NOTE — Patient Instructions (Signed)
 Continue venlafaxine  150 mg daily  Continue buspirone  30 mg twice a day  Continue Trazodone  25-50 mg at night as needed for insomnia Next appointment: 12/12 at 9 am

## 2024-05-26 ENCOUNTER — Other Ambulatory Visit: Payer: Self-pay | Admitting: Psychiatry

## 2024-05-27 NOTE — Telephone Encounter (Signed)
 I believe he has been taking trazodone  25-50 mg at night as needed. Could you ask him if he prefers 50 mg tab instead of 100 mg tab? thanks

## 2024-05-28 ENCOUNTER — Other Ambulatory Visit: Payer: Self-pay | Admitting: Psychiatry

## 2024-05-28 MED ORDER — TRAZODONE HCL 100 MG PO TABS: 50.0000 mg | ORAL_TABLET | Freq: Every day | ORAL | 1 refills | Status: DC

## 2024-06-01 ENCOUNTER — Other Ambulatory Visit: Payer: Self-pay | Admitting: Psychiatry

## 2024-06-28 ENCOUNTER — Other Ambulatory Visit: Payer: Self-pay | Admitting: Psychiatry

## 2024-07-22 NOTE — Progress Notes (Unsigned)
 Virtual Visit via Video Note  I connected with Gilverto Dileonardo Tyrell on 07/27/2024 at  9:00 AM EST by a video enabled telemedicine application and verified that I am speaking with the correct person using two identifiers.  Location: Patient: home Provider: home office Persons participated in the visit- patient, provider    I discussed the limitations of evaluation and management by telemedicine and the availability of in person appointments. The patient expressed understanding and agreed to proceed.   I discussed the assessment and treatment plan with the patient. The patient was provided an opportunity to ask questions and all were answered. The patient agreed with the plan and demonstrated an understanding of the instructions.   The patient was advised to call back or seek an in-person evaluation if the symptoms worsen or if the condition fails to improve as anticipated.   Katheren Sleet, MD    [facility] MD/PA/NP OP Progress Note  07/27/2024 9:31 AM MURLE OTTING  MRN:  [id number]  Chief Complaint:  Chief Complaint  Patient presents with   Follow-up   HPI:  This is a follow-up appointment for depression, panic disorder and insomnia.  He states that he has been doing good.  He had a good Thanksgiving with his family and friends.  He is enjoying declaration for Christmas.  His mood has been good.  He denies feeling depressed.  He has good appetite since being off Wegovy.  He denies anxiety or panic attacks.  He sleeps up to 10 hours and denies fatigue. Although he turns his body at night, he denies sleep walking.  He denies SI, hallucinations.  He has occasional dizziness when he stands up quickly.  Psychoeducation is provided to avoid orthostatic hypotension.  He continues to have occasional choking when he tries to swallow.  He believes this has been better.  He has an upcoming appointment with neurologist in August.  He agrees to contact them if any worsening.  He feels comfortable to stay on the  current medication regimen.   Weight: 70.3 kg (155 lb)  12.2025 Wt Readings from Last 3 Encounters:  04/24/24 150 lb (68 kg)  12/14/23 161 lb (73 kg)  10/21/23 175 lb (79.4 kg)     Household: wife Marital status: married in 1976 Number of children: 2 (daughter, son age, 65, 44 in [place]), two grandchildren 13-16 Employment: retired, fish farm manager at art therapist in 2022 He grew up in [place] home.  He states that although they were not rich, his parents provided enough to him.   Visit Diagnosis:    ICD-10-CM   1. MDD (major depressive disorder), recurrent, in partial remission  F33.41     2. Panic disorder  F41.0     3. Insomnia, unspecified type  G47.00       Past Psychiatric History: Please see initial evaluation for full details. I have reviewed the history. No updates at this time.     Past Medical History:  Past Medical History:  Diagnosis Date   Anxiety    Chronic diastolic CHF (congestive heart failure) (HCC)    Chronic motor tic disorder    COPD (chronic obstructive pulmonary disease) (HCC)    Depression    Diabetic polyneuropathy (HCC)    Diffuse cerebral atrophy    Diplopia    Esophagitis, eosinophilic    GERD (gastroesophageal reflux disease)    Glaucoma    History of carotid stenosis    History of colonic polyps    History of kidney stones  Hypercholesteremia    Hyperlipidemia    Hypertension    Neuropathy    Osteoarthritis of left knee    Sleep apnea    Stroke (HCC) 12/10/1996   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   TIA (transient ischemic attack)    Type 1 diabetes (HCC)    on insulin  pump    Past Surgical History:  Procedure Laterality Date   CAROTID ARTERY ANGIOPLASTY Right 10/18/2004   CATARACT EXTRACTION, BILATERAL Bilateral    COLONOSCOPY     COLONOSCOPY WITH PROPOFOL  N/A 08/25/2018   Procedure: COLONOSCOPY WITH PROPOFOL ;  Surgeon: Viktoria Lamar DASEN, MD;  Location: [facility] ENDOSCOPY;  Service: Endoscopy;   Laterality: N/A;   CYSTOSCOPY     ESOPHAGOGASTRODUODENOSCOPY     ESOPHAGOGASTRODUODENOSCOPY N/A 09/01/2022   Procedure: ESOPHAGOGASTRODUODENOSCOPY (EGD);  Surgeon: Toledo, Ladell POUR, MD;  Location: [facility] ENDOSCOPY;  Service: Gastroenterology;  Laterality: N/A;   ESOPHAGOGASTRODUODENOSCOPY (EGD) WITH PROPOFOL  N/A 08/25/2018   Procedure: ESOPHAGOGASTRODUODENOSCOPY (EGD) WITH PROPOFOL ;  Surgeon: Viktoria Lamar DASEN, MD;  Location: [facility] ENDOSCOPY;  Service: Endoscopy;  Laterality: N/A;   EYE SURGERY     JOINT REPLACEMENT     PARTIAL KNEE ARTHROPLASTY Left 01/05/2022   Procedure: UNICOMPARTMENTAL KNEE;  Surgeon: Edie Norleen PARAS, MD;  Location: [facility] ORS;  Service: Orthopedics;  Laterality: Left;   WISDOM TOOTH EXTRACTION      Family Psychiatric History: Please see initial evaluation for full details. I have reviewed the history. No updates at this time.     Family History:  Family History  Problem Relation Age of Onset   Parkinson's disease Father    Diabetes Mellitus II Brother     Social History:  Social History   Socioeconomic History   Marital status: Married    Spouse name: Clarita   Number of children: 2   Years of education: Not on file   Highest education level: Some college, no degree  Occupational History   Not on file  Tobacco Use   Smoking status: Never   Smokeless tobacco: Never  Vaping Use   Vaping status: Never Used  Substance and Sexual Activity   Alcohol use: Not Currently    Comment: occasional   Drug use: Never   Sexual activity: Not on file    Comment: not asked if sexually active  Other Topics Concern   Not on file  Social History Narrative   Not on file   Social Drivers of Health   Tobacco Use: Low Risk (07/27/2024)   Patient History    Smoking Tobacco Use: Never    Smokeless Tobacco Use: Never    Passive Exposure: Not on file  Financial Resource Strain: Medium Risk (09/12/2023)   Received from [facility]   Overall Financial  Resource Strain (CARDIA)    Difficulty of Paying Living Expenses: Somewhat hard  Food Insecurity: No Food Insecurity (09/12/2023)   [facility]   Epic    Within the past 12 months, you worried that your food would run out before you got the money to buy more.: Never true    Within the past 12 months, the food you bought just didn't last and you didn't have money to get more.: Never true  Transportation Needs: No Transportation Needs (09/12/2023)   Received from [facility] - Transportation    In the past 12 months, has lack of transportation kept you from medical appointments or from getting medications?: No  Lack of Transportation (Non-Medical): No  Physical Activity: Not on file  Stress: Not on file  Social Connections: Not on file  Depression (PHQ2-9): Medium Risk (11/09/2022)   Depression (PHQ2-9)    PHQ-2 Score: 6  Alcohol Screen: Not on file  Housing: Unknown (04/27/2024)   Received from [facility]   Epic    In the last 12 months, was there a time when you were not able to pay the mortgage or rent on time?: No    Number of Times Moved in the Last Year: Not on file    At any time in the past 12 months, were you homeless or living in a shelter (including now)?: No  Utilities: Not At Risk (09/12/2023)   Received from [facility] Utilities    Threatened with loss of utilities: No  Health Literacy: Not on file    Allergies:  Allergies  Allergen Reactions   Other Rash    Clonidine 0.3 mg patch (adhesive caused the irritation)    Metabolic Disorder Labs: Lab Results  Component Value Date   HGBA1C 7.2 (H) 04/09/2021   MPG 159.94 04/09/2021   MPG 151 03/02/2017   Lab Results  Component Value Date   PROLACTIN 25.6 (H) 03/02/2017   Lab Results  Component Value Date   CHOL 151 04/10/2021   TRIG 134 04/10/2021   HDL 46 04/10/2021   CHOLHDL 3.3 04/10/2021   VLDL 27  04/10/2021   LDLCALC 78 04/10/2021   LDLCALC 119 (H) 03/02/2017   Lab Results  Component Value Date   TSH 1.582 11/09/2022   TSH 0.937 04/10/2021    Therapeutic Level Labs: No results found for: LITHIUM No results found for: VALPROATE No results found for: CBMZ  Current Medications: Current Outpatient Medications  Medication Sig Dispense Refill   albuterol  (VENTOLIN  HFA) 108 (90 Base) MCG/ACT inhaler Inhale 2 puffs into the lungs every 8 (eight) hours as needed.     Alpha-Lipoic Acid 200 MG CAPS Take 200 mg by mouth daily.     amLODipine  (NORVASC ) 5 MG tablet Take 5 mg by mouth every evening.     atorvastatin  (LIPITOR) 20 MG tablet Take 20 mg by mouth every evening.      budesonide-formoterol (SYMBICORT) 80-4.5 MCG/ACT inhaler Inhale 2 puffs into the lungs in the morning and at bedtime.     busPIRone  (BUSPAR ) 30 MG tablet Take 1 tablet (30 mg total) by mouth 2 (two) times daily. 60 tablet 3   CALCIUM  PO Take 1,200 mg by mouth daily.     CLINPRO 5000 1.1 % PSTE SMARTSIG:Sparingly To Teeth Daily     clonazePAM  (KLONOPIN ) 0.5 MG tablet Take 0.5 mg by mouth 2 (two) times daily as needed for anxiety.     clopidogrel  (PLAVIX ) 75 MG tablet Take 75 mg by mouth daily.     Coenzyme Q10 (COQ-10) 100 MG CAPS Take 100 mg by mouth daily.     furosemide  (LASIX ) 20 MG tablet Take 20 mg by mouth daily.     gabapentin  (NEURONTIN ) 100 MG capsule Take 300 mg by mouth 3 (three) times daily.     glucose blood (CONTOUR NEXT TEST) test strip TEST BLOOD SUGAR 7 TIMES A DAY AS DIRECTED     HUMALOG 100 UNIT/ML injection Via Insulin  Pump     hydrocortisone  2.5 % cream Apply 1 Application topically See admin instructions. Apply on Monday Wednesday and Friday to affected areas of face 30 g 11  ketoconazole  (NIZORAL ) 2 % cream Apply 1 Application topically See admin instructions. Apply to face Tues Thurs Sat 30 g 11   latanoprost  (XALATAN ) 0.005 % ophthalmic solution Place 1 drop into both eyes at  bedtime.     Multiple Vitamin (MULTIVITAMIN WITH MINERALS) TABS tablet Take 1 tablet by mouth daily.     Potassium 99 MG TABS Take 2 tablets by mouth daily.     [START ON 08/26/2024] traZODone  (DESYREL ) 100 MG tablet Take 0.5-1 tablets (50-100 mg total) by mouth at bedtime. 30 tablet 3   valbenazine (INGREZZA) 80 MG capsule Take 60 mg by mouth daily.     [START ON 07/31/2024] venlafaxine  XR (EFFEXOR -XR) 150 MG 24 hr capsule Take 1 capsule (150 mg total) by mouth daily with breakfast. 30 capsule 5   Wheat Dextrin (BENEFIBER DRINK MIX) PACK Take 1 packet by mouth daily. 30 each 0   No current facility-administered medications for this visit.     Musculoskeletal: Strength & Muscle Tone: N/A Gait & Station: N/A Patient leans: N/A  Psychiatric Specialty Exam: Review of Systems  Psychiatric/Behavioral: Negative.    All other systems reviewed and are negative.   There were no vitals taken for this visit.There is no height or weight on file to calculate BMI.  General Appearance: Well Groomed  Eye Contact:  Good  Speech:  Garbled- slightly improving  Volume:  Normal  Mood:  good  Affect:  Appropriate, Congruent, and calm  Thought Process:  Coherent  Orientation:  Full (Time, Place, and Person)  Thought Content: Logical   Suicidal Thoughts:  No  Homicidal Thoughts:  No  Memory:  Immediate;   Good  Judgement:  Good  Insight:  Good  Psychomotor Activity:  Normal  Concentration:  Concentration: Good and Attention Span: Good  Recall:  Good  Fund of Knowledge: Good  Language: Good  Akathisia:  No  Handed:  Right  AIMS (if indicated): not done  Assets:  Communication Skills Desire for Improvement  ADL's:  Intact  Cognition: WNL  Sleep:  Good   Screenings: AIMS    Flowsheet Row [facility]  AIMS Total Score 0   AUDIT    Flowsheet Row [facility]  Alcohol Use Disorder Identification Test Final Score (AUDIT) 0   GAD-7    Flowsheet Row Office Visit from 11/09/2022 in [facility]  Total GAD-7 Score 9   PHQ2-9    Flowsheet Row [facility] Visit from [facility]/09/2022 in [facility] Nutrition from 08/26/2021 in [facility] Total Score 2 0  PHQ-9 Total Score 6 --   Flowsheet Row ED from 04/24/2024 in [facility] at [facility] ED from 12/14/2023 in [facility] Department at [facility] ED from 10/21/2023 in [facility] Emergency Department at [facility]  C-SSRS RISK CATEGORY No Risk No Risk No Risk     Assessment and Plan:  MERWYN HODAPP is a 68 yo old male with a history of depression, anxiety, Complex motor tics, type I diabetes, history of right pontine stroke,  TBI, hypertension, hyperlipidemia,   OSA on CPAP, OA, eosinophilic esophagitis, who presents for follow up appointment for below.   1. MDD (major depressive disorder), recurrent, in partial remission 2. Panic disorder The patient is currently undergoing evaluation for involuntary movements.  He lost his mother in 2024, and is actively providing care for his wife.  History: anxiety for many years. 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   Exam is notable for calm and brighter affect.  He endorsed depression for Christmas.  He had good benefit from uptitration/cross tapering from Lexapro .  Will continue current dose of venlafaxine  to target depression and panic disorder.  Will continue BuSpar  for anxiety.   3. Insomnia, unspecified type - on CPAP machine    Improving.  He denies hypersomnolence since being on venlafaxine .  Will continue current dose of trazodone  as needed for insomnia.     # benzodiazepine dependence - on clonazepam  0.5 mg BID  since 20's Unchanged. The medication is prescribed by another provider. He is advised this medication to be tapered off in the future to avoid long-term risks.   # weight loss Improving since discontinuation of Wegovy.  Will continue to assess and intervene as needed.    Plan Continue venlafaxine  150 mg daily (his PCP sent in 75 mg daily) Continue buspirone  30 mg twice a day  Continue Trazodone  25-50 mg at night as needed for insomnia Next appointment: 2/6 at 11:30, video  - on ingrezza 60 mg daily for slurred speech - on clonazepam  0.5 mg twice a day  - on GOLO, OTC   The patient demonstrates the following risk factors for suicide: Chronic risk factors for suicide include: psychiatric disorder of depression, anxiety . Acute risk factors for suicide include: loss (financial, interpersonal, professional). Protective factors for this patient include: coping skills and hope for the future. Considering these factors, the overall suicide risk at this point appears to be low. Patient is appropriate for outpatient follow up.     Collaboration of Care: Collaboration of Care: Other reviewed notes in Epic  Patient/Guardian was advised Release of Information must be obtained prior to any record release in order to collaborate their care with an outside provider. Patient/Guardian was advised if they have not already done so to contact the registration department to sign all necessary forms in order for us  to release information regarding their care.   Consent: Patient/Guardian gives verbal consent for treatment and assignment of benefits for services provided during this visit. Patient/Guardian expressed understanding and agreed to proceed.    Katheren Sleet, MD 07/27/2024, 9:31 AM

## 2024-07-25 NOTE — Telephone Encounter (Signed)
 He should have an order per epic. Could you contact the pharmacy, verify this and update the patient? Thanks.

## 2024-07-25 NOTE — Telephone Encounter (Signed)
 Called patients pharmacy spoke to Sandra there is an order that to start on 07-27-24 called patient to make aware he voiced understanding he does have enough until 07-27-24

## 2024-07-27 ENCOUNTER — Encounter: Payer: Self-pay | Admitting: Psychiatry

## 2024-07-27 ENCOUNTER — Telehealth: Admitting: Psychiatry

## 2024-07-27 DIAGNOSIS — G4700 Insomnia, unspecified: Secondary | ICD-10-CM

## 2024-07-27 DIAGNOSIS — F410 Panic disorder [episodic paroxysmal anxiety] without agoraphobia: Secondary | ICD-10-CM | POA: Diagnosis not present

## 2024-07-27 DIAGNOSIS — F3341 Major depressive disorder, recurrent, in partial remission: Secondary | ICD-10-CM

## 2024-07-27 MED ORDER — TRAZODONE HCL 100 MG PO TABS: 50.0000 mg | ORAL_TABLET | Freq: Every day | ORAL | 3 refills | Status: AC

## 2024-07-27 MED ORDER — VENLAFAXINE HCL ER 150 MG PO CP24: 150.0000 mg | ORAL_CAPSULE | Freq: Every day | ORAL | 5 refills | Status: AC

## 2024-08-26 ENCOUNTER — Other Ambulatory Visit: Payer: Self-pay | Admitting: Psychiatry

## 2024-09-06 NOTE — Progress Notes (Addendum)
 " Follow-up   History of Present Illness: Edgar Huynh is a 69 y.o. male type I diabetes htn, who presents to clinic for annual cpap f/u visit. On cpap not every night. Working for him, rested. Tachy, diabetes, hypertension. No headaches, falling asleep while driving or chest pain. No edema or calf pain. Encouraged and educated about the need to wear the cpap.   Current Medications:  Current Outpatient Medications  Medication Sig Dispense Refill   albuterol  MDI, PROVENTIL , VENTOLIN , PROAIR , HFA 90 mcg/actuation inhaler INHALE 2 INHALATIONS INTO THE LUNGS THREE TIMES DAILY 13.4 g 1   alpha lipoic acid 100 mg Cap Take 100 mg by mouth once daily     amLODIPine  (NORVASC ) 5 MG tablet TAKE 1 TABLET(5 MG) BY MOUTH DAILY 90 tablet 1   atorvastatin  (LIPITOR) 20 MG tablet Take 1 tablet (20 mg total) by mouth once daily 90 tablet 3   BENEFIBER, WHEAT DEXTRIN, ORAL Take by mouth     blood-glucose sensor (DEXCOM G6 SENSOR) Devi Use 1 each every 10 (ten) days 9 each 3   blood-glucose transmitter Devi Use 1 each every 3 (three) months 1 each 3   budesonide-formoteroL (SYMBICORT) 80-4.5 mcg/actuation inhaler Inhale 2 inhalations into the lungs 2 (two) times daily     busPIRone  (BUSPAR ) 30 MG tablet Take 30 mg by mouth 2 (two) times daily     carbidopa-levodopa (SINEMET) 25-100 mg tablet Take 1 tablet by mouth 3 (three) times daily 90 tablet 11   clonazePAM  (KLONOPIN ) 0.5 MG tablet Take 1 tablet (0.5 mg total) by mouth 2 (two) times daily as needed for up to 90 days 60 tablet 2   clopidogreL  (PLAVIX ) 75 mg tablet Take 1 tablet (75 mg total) by mouth once daily 90 tablet 1   coenzyme Q10 10 mg capsule Take 100 mg by mouth once daily     diclofenac (VOLTAREN) 1 % topical gel APPLY 2 GRAMS EXTERNALLY TO THE AFFECTED AREA FOUR TIMES DAILY (Patient taking differently: 4 (four) times daily as needed) 100 g 11   FUROsemide  (LASIX ) 20 MG tablet Take 1 tablet (20 mg total) by mouth once daily 90 tablet 1    hydrocortisone  2.5 % cream Apply topically 2 (two) times daily as needed     insulin  LISPRO (HUMALOG U-100 INSULIN ) injection (concentration 100 units/mL) INJECT UP TO 100 UNITS VIA PUMP DAILY AS DIRECTED BY MD 90 mL 4   ketoconazole  (NIZORAL ) 2 % cream Apply topically     latanoprost  (XALATAN ) 0.005 % ophthalmic solution Apply 1 drop to eye at bedtime     multivitamin tablet Take 1 tablet by mouth once daily     pantoprazole  (PROTONIX ) 40 MG DR tablet Take 1 tablet (40 mg total) by mouth once daily 30 tablet 11   potassium gluconate 2.5 mEq Tab Take 1 tablet by mouth once daily     SF 5000 PLUS 1.1 % APPLY PEA SIZED AMOUNT AND BRUSH FOR TWO MINUTES DAILY     valbenazine 60 mg Cap Take 60 mg by mouth once daily 30 capsule 11   venlafaxine  (EFFEXOR -XR) 37.5 MG XR capsule Take 1 capsule (37.5 mg total) by mouth once daily 30 capsule 11   No current facility-administered medications for this visit.    Problem List:  Patient Active Problem List  Diagnosis   Type 1 diabetes mellitus with diabetic polyneuropathy (CMS/HHS-HCC)   Hyperlipidemia associated with type 1 diabetes mellitus , unspecified (CMS-HCC)   History of carotid stenosis   Hypertension associated  with diabetes (CMS/HHS-HCC)   History of stroke   History of colon polyps   Gastroesophageal reflux disease without esophagitis   Neuropathy   Depression, prolonged   Chronic motor tic disorder   Adjustment disorder with mixed anxiety and depressed mood   Anxiety disorder   Diabetic neuropathy (CMS/HHS-HCC)   Diffuse cerebral atrophy ()   DM (diabetes mellitus) type I, controlled, with peripheral vascular disorder (CMS/HHS-HCC)   Diplopia   Change in voice   Esophagitis, eosinophilic   Chronic diastolic CHF (congestive heart failure) (CMS/HHS-HCC)   Primary osteoarthritis of left knee   Status post left partial knee replacement   Diabetic retinopathy (CMS/HHS-HCC)   HLD (hyperlipidemia)     History: Past Medical History:  Diagnosis Date   Anxiety disorder    Chronic diastolic CHF (congestive heart failure) (CMS/HHS-HCC) 04/14/2021   Colon polyp    3 small a long time ago. No cancer   Depression    Diabetes mellitus type 2, uncomplicated (CMS/HHS-HCC)    Diabetes mellitus type I (CMS/HHS-HCC) 10/1979   Had it for more than 39 years.   Esophagitis, eosinophilic 09/01/2018   GERD (gastroesophageal reflux disease)    Glaucoma (increased eye pressure)    History of colon polyps 10/21/2017   Hyperlipidemia    Hypertension    Neuropathy    Primary osteoarthritis of left knee 11/27/2021   Sleep apnea    On CPAP   Stroke (CMS/HHS-HCC)     Past Surgical History:  Procedure Laterality Date   COLONOSCOPY  08/25/2018   PH Adenomatous Polyps: [facility] 08/2023  (09/13/2023 Recall letter returned.awb)   EGD  08/25/2018   Eosinophilic Esophagitis: [facility] 08/2021   Left unicondylar knee arthroplasty. Left 01/05/2022   Dr. Edie   [facility]  09/01/2022   Eosinophilic esophagitis/Gastritis/Repeat 11yrs/TKT   CATARACT EXTRACTION Bilateral    COLONOSCOPY     OTHER SURGERY     ARTERY    UPPER GASTROINTESTINAL ENDOSCOPY      Family History  Problem Relation Name Age of Onset   No Known Problems Mother     Parkinsonism Father Lamar Meter Polio    Diabetes type II Brother Interior And Spatial Designer     Social History   Socioeconomic History   Marital status: Married  Tobacco Use   Smoking status: Never   Smokeless tobacco: Never  Vaping Use   Vaping status: Never Used  Substance and Sexual Activity   Alcohol use: Not Currently   Drug use: Never   Sexual activity: Not Currently  Social History Narrative   Education: Automotive Engineer   Occupation: Art Gallery Manager   Hobbies: stump collecting   Marital Status: married   Social Drivers of Corporate Investment Banker Strain: Medium Risk (09/12/2023)   Overall Financial Resource Strain (CARDIA)    Difficulty of Paying  Living Expenses: Somewhat hard  Food Insecurity: No Food Insecurity (09/12/2023)   Hunger Vital Sign    Worried About Running Out of Food in the Last Year: Never true    Ran Out of Food in the Last Year: Never true  Transportation Needs: No Transportation Needs (09/12/2023)   PRAPARE - Administrator, Civil Service (Medical): No    Lack of Transportation (Non-Medical): No    Allergies:  Robaxin  [methocarbamol ] and Other  Review of Systems: As per above. Pretty much unchanged  No associated cardiopulmonary, GI, GU, dermatological symptoms today. No focal neurological symptoms or psychological changes. No edema. Calf pain, fever. Appetite is good.  Physical Exam: BP 127/78   Pulse 109   Wt 73.9 kg (163 lb)   SpO2 97% Comment: room air  BMI 25.53 kg/m  73.9 kg (163 lb) 97% General:  NAD. Able to speak in complete sentences without cough or dyspnea HEENT: Normocephalic, nontraumatic. Extraocular movements intact NECK: Supple. No JVD, nodes, thryomegaly CV: RRR  ( tach, chronic),no murmurs, gallops, rubs PULM: Normal respiratory effort, Clear to auscultation bilaterally without wheezing or crackles ABD: Benign EXTREMITIES: No significant edema, cyanosis or Homans'signs SKIN: Fair turgor. No rashes LYMPHATIC: No nodes NEURO: No gross deficits PSYCH: Appropriate affect , alert, oriented   Impression: OSA  wearing not every night,  machine is working well. Rested. No headaches, b/p good. -watch weight -auto cpap 5- 16 cm h20  Encouraged to wear it every night > four hours -F/u in 12 months           "

## 2024-09-16 NOTE — Progress Notes (Unsigned)
 Virtual Visit via Video Note  I connected with Edgar Huynh on 09/21/24 at 11:30 AM EST by a video enabled telemedicine application and verified that I am speaking with the correct person using two identifiers.  Location: Patient: home Provider: home office Persons participated in the visit- patient, provider    I discussed the limitations of evaluation and management by telemedicine and the availability of in person appointments. The patient expressed understanding and agreed to proceed.   I discussed the assessment and treatment plan with the patient. The patient was provided an opportunity to ask questions and all were answered. The patient agreed with the plan and demonstrated an understanding of the instructions.   The patient was advised to call back or seek an in-person evaluation if the symptoms worsen or if the condition fails to improve as anticipated.    Katheren Sleet, MD    [facility] MD/PA/NP OP Progress Note  09/21/2024 11:51 AM Edgar Huynh  MRN:  [id number]  Chief Complaint:  Chief Complaint  Patient presents with   Follow-up   HPI:  This is a follow-up appointment for depression, anxiety, insomnia.  He states that he has been doing well except the snow.  He has been cleaning the room.  He had good holiday with his family including his daughter.  He has been reading Bibles so that he can be closer to God.  Although the church has been closed in the last few weeks, he is planning to go there again next time.  He finds the community to be very supportive, and he feels comfortable there.  He continues to have slurred speech, although it has been getting better.  While he continues to have some muscle movement, he states that he has take since childhood.  His appetite has been increased since discontinuation of Wegovy.  He denies feeling depressed.  He denies panic attacks or anxiety.  He has been on clonazepam  for many years and he is not interested in tapering off this  medication.  He sleeps well at night.  He denies SI, hallucinations.  He agrees with the plans as outlined.   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  He states that although they were not rich, his parents provided enough to him.   Visit Diagnosis:    ICD-10-CM   1. MDD (major depressive disorder), recurrent, in partial remission  F33.41     2. Panic disorder  F41.0     3. Insomnia, unspecified type  G47.00       Past Psychiatric History: Please see initial evaluation for full details. I have reviewed the history. No updates at this time.     Past Medical History:  Past Medical History:  Diagnosis Date   Anxiety    Chronic diastolic CHF (congestive heart failure) (HCC)    Chronic motor tic disorder    COPD (chronic obstructive pulmonary disease) (HCC)    Depression    Diabetic polyneuropathy (HCC)    Diffuse cerebral atrophy    Diplopia    Esophagitis, eosinophilic    GERD (gastroesophageal reflux disease)    Glaucoma    History of carotid stenosis  History of colonic polyps    History of kidney stones    Hypercholesteremia    Hyperlipidemia    Hypertension    Neuropathy    Osteoarthritis of left knee    Sleep apnea    Stroke (HCC) 12/10/1996   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   TIA (transient ischemic attack)    Type 1 diabetes (HCC)    on insulin  pump    Past Surgical History:  Procedure Laterality Date   CAROTID ARTERY ANGIOPLASTY Right 10/18/2004   CATARACT EXTRACTION, BILATERAL Bilateral    COLONOSCOPY     COLONOSCOPY WITH PROPOFOL  N/A 08/25/2018   Procedure: COLONOSCOPY WITH PROPOFOL ;  Surgeon: Viktoria Lamar DASEN,  MD;  Location: [facility] ENDOSCOPY;  Service: Endoscopy;  Laterality: N/A;   CYSTOSCOPY     ESOPHAGOGASTRODUODENOSCOPY     ESOPHAGOGASTRODUODENOSCOPY N/A 09/01/2022   Procedure: ESOPHAGOGASTRODUODENOSCOPY (EGD);  Surgeon: Toledo, Ladell POUR, MD;  Location: [facility] ENDOSCOPY;  Service: Gastroenterology;  Laterality: N/A;   ESOPHAGOGASTRODUODENOSCOPY (EGD) WITH PROPOFOL  N/A 08/25/2018   Procedure: ESOPHAGOGASTRODUODENOSCOPY (EGD) WITH PROPOFOL ;  Surgeon: Viktoria Lamar DASEN, MD;  Location: [facility] ENDOSCOPY;  Service: Endoscopy;  Laterality: N/A;   EYE SURGERY     JOINT REPLACEMENT     PARTIAL KNEE ARTHROPLASTY Left 01/05/2022   Procedure: UNICOMPARTMENTAL KNEE;  Surgeon: Edie Norleen PARAS, MD;  Location: [facility] ORS;  Service: Orthopedics;  Laterality: Left;   WISDOM TOOTH EXTRACTION      Family Psychiatric History: Please see initial evaluation for full details. I have reviewed the history. No updates at this time.     Family History:  Family History  Problem Relation Age of Onset   Parkinson's disease Father    Diabetes Mellitus II Brother     Social History:  Social History   Socioeconomic History   Marital status: Married    Spouse name: Clarita   Number of children: 2   Years of education: Not on file   Highest education level: Some college, no degree  Occupational History   Not on file  Tobacco Use   Smoking status: Never   Smokeless tobacco: Never  Vaping Use   Vaping status: Never Used  Substance and Sexual Activity   Alcohol use: Not Currently    Comment: occasional   Drug use: Never   Sexual activity: Not on file    Comment: not asked if sexually active  Other Topics Concern   Not on file  Social History Narrative   Not on file   Social Drivers of Health   Tobacco Use: Low Risk (09/21/2024)   Patient History    Smoking Tobacco Use: Never    Smokeless Tobacco Use: Never    Passive Exposure: Not on file  Financial Resource Strain: Medium Risk (09/12/2023)   Received [facility]   Overall Financial Resource Strain (CARDIA)    Difficulty of Paying Living Expenses: Somewhat hard  Food Insecurity: No Food Insecurity (09/12/2023)   [facility] from [facility]   Epic    Within the past 12 months, you worried that your food would run out before you got the money to buy more.: Never true    Within the past 12 months, the food you bought just didn't last and you didn't have money to get more.: Never true  Transportation Needs: No Transportation Needs (09/12/2023)   Received from [facility] - [facility]    In the past 12 months, has lack of  transportation kept you from medical appointments or from getting medications?: No    Lack of Transportation (Non-Medical): No  Physical Activity: Not on file  Stress: Not on file  Social Connections: Not on file  Depression (PHQ2-9): Medium Risk (11/09/2022)   Depression (PHQ2-9)    PHQ-2 Score: 6  Alcohol Screen: Not on file  Housing: Unknown (09/06/2024)   Received from [facility]   Epic    In the last 12 months, was there a time when you were not able to pay the mortgage or rent on time?: No    Number of Times Moved in the Last Year: Not on file    At any time in the past 12 months, were you homeless or living in a shelter (including now)?: No  Utilities: Not At Risk (09/12/2023)   Received from [facility]ities    Threatened with loss of utilities: No  Health Literacy: Not on file    Allergies: Allergies[1]  Metabolic Disorder Labs: Lab Results  Component Value Date   HGBA1C 7.2 (H) 04/09/2021   MPG 159.94 04/09/2021   MPG 151 03/02/2017   Lab Results  Component Value Date   PROLACTIN 25.6 (H) 03/02/2017   Lab Results  Component Value Date   CHOL 151 04/10/2021   TRIG 134 04/10/2021   HDL 46 04/10/2021   CHOLHDL 3.3 04/10/2021   VLDL 27 04/10/2021   LDLCALC 78 04/10/2021   LDLCALC 119 (H)  03/02/2017   Lab Results  Component Value Date   TSH 1.582 11/09/2022   TSH 0.937 04/10/2021    Therapeutic Level Labs: No results found for: LITHIUM No results found for: VALPROATE No results found for: CBMZ  Current Medications: Current Outpatient Medications  Medication Sig Dispense Refill   albuterol  (VENTOLIN  HFA) 108 (90 Base) MCG/ACT inhaler Inhale 2 puffs into the lungs every 8 (eight) hours as needed.     Alpha-Lipoic Acid 200 MG CAPS Take 200 mg by mouth daily.     amLODipine  (NORVASC ) 5 MG tablet Take 5 mg by mouth every evening.     atorvastatin  (LIPITOR) 20 MG tablet Take 20 mg by mouth every evening.      budesonide-formoterol (SYMBICORT) 80-4.5 MCG/ACT inhaler Inhale 2 puffs into the lungs in the morning and at bedtime.     [START ON 09/24/2024] busPIRone  (BUSPAR ) 30 MG tablet Take 1 tablet (30 mg total) by mouth 2 (two) times daily. 60 tablet 5   CALCIUM  PO Take 1,200 mg by mouth daily.     CLINPRO 5000 1.1 % PSTE SMARTSIG:Sparingly To Teeth Daily     clonazePAM  (KLONOPIN ) 0.5 MG tablet Take 0.5 mg by mouth 2 (two) times daily as needed for anxiety.     clopidogrel  (PLAVIX ) 75 MG tablet Take 75 mg by mouth daily.     Coenzyme Q10 (COQ-10) 100 MG CAPS Take 100 mg by mouth daily.     furosemide  (LASIX ) 20 MG tablet Take 20 mg by mouth daily.     gabapentin  (NEURONTIN ) 100 MG capsule Take 300 mg by mouth 3 (three) times daily.     glucose blood (CONTOUR NEXT TEST) test strip TEST BLOOD SUGAR 7 TIMES A DAY AS DIRECTED     HUMALOG 100 UNIT/ML injection Via Insulin  Pump     hydrocortisone  2.5 % cream Apply 1 Application topically See admin instructions. Apply on Monday Wednesday and Friday to affected areas of face 30 g 11   ketoconazole  (NIZORAL ) 2 %  cream Apply 1 Application topically See admin instructions. Apply to face Tues Thurs Sat 30 g 11   latanoprost  (XALATAN ) 0.005 % ophthalmic solution Place 1 drop into both eyes at bedtime.     Multiple Vitamin  (MULTIVITAMIN WITH MINERALS) TABS tablet Take 1 tablet by mouth daily.     Potassium 99 MG TABS Take 2 tablets by mouth daily.     traZODone  (DESYREL ) 100 MG tablet Take 0.5-1 tablets (50-100 mg total) by mouth at bedtime. 30 tablet 3   valbenazine (INGREZZA) 80 MG capsule Take 60 mg by mouth daily.     venlafaxine  XR (EFFEXOR -XR) 150 MG 24 hr capsule Take 1 capsule (150 mg total) by mouth daily with breakfast. 30 capsule 5   Wheat Dextrin (BENEFIBER DRINK MIX) PACK Take 1 packet by mouth daily. 30 each 0   No current facility-administered medications for this visit.     Musculoskeletal: Strength & Muscle Tone: N/A Gait & Station: N/A Patient leans: N/A  Psychiatric Specialty Exam: Review of Systems  Psychiatric/Behavioral: Negative.    All other systems reviewed and are negative.   There were no vitals taken for this visit.There is no height or weight on file to calculate BMI.  General Appearance: Well Groomed  Eye Contact:  Good  Speech:  Slurred  Volume:  Normal  Mood:  good  Affect:  Appropriate, Congruent, and calm  Thought Process:  Coherent  Orientation:  Full (Time, Place, and Person)  Thought Content: Logical   Suicidal Thoughts:  No  Homicidal Thoughts:  No  Memory:  Immediate;   Good  Judgement:  Good  Insight:  Good  Psychomotor Activity:  Normal  Concentration:  Concentration: Good and Attention Span: Good  Recall:  Good  Fund of Knowledge: Good  Language: Good  Akathisia:  No  Handed:  Right  AIMS (if indicated): not done  Assets:  Communication Skills Desire for Improvement  ADL's:  Intact  Cognition: WNL  Sleep:  Good   Screenings: AIMS    Flowsheet Row [facility]  AIMS Total Score 0   AUDIT    Flowsheet Row [facility]  Alcohol Use Disorder Identification Test Final Score (AUDIT) 0   GAD-7     Flowsheet Row Office Visit from 11[facility]09/20[facility] in [facility]  Total GAD-7 Score 9   PHQ2-9    Flowsheet Row Office Visit from 11/09/2022 in [facility] Nutrition from 08/26/2021 in [facility] Total Score 2 0  PHQ-9 Total Score 6 --   Flowsheet Row ED from 04/24/2024 in [facility] ED from 12/14/2023 in [facility] ED from 10/21/2023 in [facility] at [facility]  C-SSRS RISK CATEGORY No Risk No Risk No Risk     Assessment and Plan:  Edgar Huynh is a 69 yo old male with a history of depression, anxiety, Complex motor tics, type I diabetes, history of right pontine stroke,  TBI, hypertension, hyperlipidemia,   OSA on CPAP, OA, eosinophilic esophagitis, who presents for follow up appointment for below.   1. MDD (major depressive disorder), recurrent, in partial remission 2. Panic disorder The patient is currently undergoing evaluation for involuntary movements. He lost his mother in 2024, and is actively providing care for  his wife.  History: anxiety for many years. 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   The exam is notable for calm, euthymic affect.  He enjoys connection with his family and denies any concern.  Venlafaxine  has been highly effective since cross tapering from Lexapro .  Will continue the current dose to target depression and panic disorder.  Will continue BuSpar  for anxiety.   3. Insomnia, unspecified type - on CPAP machine     Stable.  Will continue current dose of trazodone  as needed for insomnia.     # benzodiazepine dependence - on clonazepam  0.5 mg BID since 20's Unchanged. The medication is prescribed by another provider.  Although he was repeatedly discussed to consider tapering  off, she is not interested in this.  Discussed potential risk of long-term risks, which includes but not limited to fall, oversedation.    # weight loss Improving since discontinuation of Wegovy.  Will continue to assess and intervene as needed.    Plan Continue venlafaxine  150 mg daily  Continue buspirone  30 mg twice a day  Continue Trazodone  25-50 mg at night as needed for insomnia Next appointment: 5/1 at 11:30, video  - on ingrezza 60 mg daily for slurred speech - on clonazepam  0.5 mg twice a day  - on GOLO, OTC     The patient demonstrates the following risk factors for suicide: Chronic risk factors for suicide include: psychiatric disorder of depression, anxiety . Acute risk factors for suicide include: loss (financial, interpersonal, professional). Protective factors for this patient include: coping skills and hope for the future. Considering these factors, the overall suicide risk at this point appears to be low. Patient is appropriate for outpatient follow up.     Collaboration of Care: Collaboration of Care: Other reviewed notes in Epic  Patient/Guardian was advised Release of Information must be obtained prior to any record release in order to collaborate their care with an outside provider. Patient/Guardian was advised if they have not already done so to contact the registration department to sign all necessary forms in order for us  to release information regarding their care.   Consent: Patient/Guardian gives verbal consent for treatment and assignment of benefits for services provided during this visit. Patient/Guardian expressed understanding and agreed to proceed.    Katheren Sleet, MD 09/21/2024, 11:51 AM     [1]  Allergies Allergen Reactions   Other Rash    Clonidine 0.3 mg patch (adhesive caused the irritation)

## 2024-09-20 ENCOUNTER — Other Ambulatory Visit: Payer: Self-pay | Admitting: Psychiatry

## 2024-09-21 ENCOUNTER — Encounter: Payer: Self-pay | Admitting: Psychiatry

## 2024-09-21 ENCOUNTER — Telehealth: Admitting: Psychiatry

## 2024-09-21 DIAGNOSIS — F3341 Major depressive disorder, recurrent, in partial remission: Secondary | ICD-10-CM

## 2024-09-21 DIAGNOSIS — G4700 Insomnia, unspecified: Secondary | ICD-10-CM

## 2024-09-21 DIAGNOSIS — F410 Panic disorder [episodic paroxysmal anxiety] without agoraphobia: Secondary | ICD-10-CM

## 2024-09-21 NOTE — Patient Instructions (Signed)
 Continue venlafaxine  150 mg daily  Continue buspirone  30 mg twice a day  Continue Trazodone  25-50 mg at night as needed for insomnia Next appointment: 5/1 at 11:30

## 2024-11-28 ENCOUNTER — Ambulatory Visit: Admitting: Dermatology

## 2024-12-14 ENCOUNTER — Telehealth: Admitting: Psychiatry
